# Patient Record
Sex: Female | Born: 1935 | Race: Black or African American | Hispanic: No | Smoking: Former smoker
Health system: Southern US, Community
[De-identification: ages and names within clinical notes are randomized; demographics above are authoritative.]

## PROBLEM LIST (undated history)

## (undated) DIAGNOSIS — M47816 Spondylosis without myelopathy or radiculopathy, lumbar region: Secondary | ICD-10-CM

## (undated) DIAGNOSIS — I639 Cerebral infarction, unspecified: Secondary | ICD-10-CM

## (undated) DIAGNOSIS — I4891 Unspecified atrial fibrillation: Secondary | ICD-10-CM

## (undated) DIAGNOSIS — H269 Unspecified cataract: Secondary | ICD-10-CM

## (undated) DIAGNOSIS — K219 Gastro-esophageal reflux disease without esophagitis: Secondary | ICD-10-CM

## (undated) DIAGNOSIS — R200 Anesthesia of skin: Secondary | ICD-10-CM

## (undated) DIAGNOSIS — H409 Unspecified glaucoma: Secondary | ICD-10-CM

## (undated) DIAGNOSIS — M1990 Unspecified osteoarthritis, unspecified site: Secondary | ICD-10-CM

## (undated) DIAGNOSIS — I10 Essential (primary) hypertension: Secondary | ICD-10-CM

## (undated) DIAGNOSIS — M545 Low back pain: Secondary | ICD-10-CM

## (undated) DIAGNOSIS — E119 Type 2 diabetes mellitus without complications: Secondary | ICD-10-CM

## (undated) DIAGNOSIS — Z8619 Personal history of other infectious and parasitic diseases: Secondary | ICD-10-CM

## (undated) DIAGNOSIS — B0229 Other postherpetic nervous system involvement: Secondary | ICD-10-CM

## (undated) DIAGNOSIS — G8929 Other chronic pain: Secondary | ICD-10-CM

## (undated) DIAGNOSIS — F411 Generalized anxiety disorder: Secondary | ICD-10-CM

## (undated) DIAGNOSIS — R011 Cardiac murmur, unspecified: Secondary | ICD-10-CM

## (undated) DIAGNOSIS — R51 Headache: Secondary | ICD-10-CM

## (undated) DIAGNOSIS — I499 Cardiac arrhythmia, unspecified: Secondary | ICD-10-CM

## (undated) DIAGNOSIS — K5792 Diverticulitis of intestine, part unspecified, without perforation or abscess without bleeding: Secondary | ICD-10-CM

## (undated) DIAGNOSIS — E785 Hyperlipidemia, unspecified: Secondary | ICD-10-CM

## (undated) DIAGNOSIS — R519 Headache, unspecified: Secondary | ICD-10-CM

## (undated) HISTORY — DX: Essential (primary) hypertension: I10

## (undated) HISTORY — DX: Hyperlipidemia, unspecified: E78.5

## (undated) HISTORY — DX: Unspecified glaucoma: H40.9

## (undated) HISTORY — DX: Generalized anxiety disorder: F41.1

## (undated) HISTORY — PX: COLONOSCOPY: SHX174

## (undated) HISTORY — DX: Other postherpetic nervous system involvement: B02.29

## (undated) HISTORY — DX: Low back pain: M54.5

## (undated) HISTORY — DX: Spondylosis without myelopathy or radiculopathy, lumbar region: M47.816

## (undated) HISTORY — DX: Unspecified osteoarthritis, unspecified site: M19.90

## (undated) HISTORY — DX: Unspecified cataract: H26.9

## (undated) HISTORY — DX: Unspecified atrial fibrillation: I48.91

## (undated) HISTORY — DX: Personal history of other infectious and parasitic diseases: Z86.19

## (undated) HISTORY — DX: Gastro-esophageal reflux disease without esophagitis: K21.9

## (undated) HISTORY — DX: Type 2 diabetes mellitus without complications: E11.9

## (undated) HISTORY — PX: POLYPECTOMY: SHX149

## (undated) HISTORY — DX: Other chronic pain: G89.29

---
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
## 1962-01-01 HISTORY — PX: ABDOMINAL HYSTERECTOMY: SHX81

## 1997-08-03 ENCOUNTER — Ambulatory Visit (HOSPITAL_BASED_OUTPATIENT_CLINIC_OR_DEPARTMENT_OTHER): Admission: RE | Admit: 1997-08-03 | Discharge: 1997-08-03 | Payer: Self-pay | Admitting: Orthopedic Surgery

## 2001-09-12 ENCOUNTER — Other Ambulatory Visit: Admission: RE | Admit: 2001-09-12 | Discharge: 2001-09-12 | Payer: Self-pay | Admitting: Family Medicine

## 2002-10-27 ENCOUNTER — Ambulatory Visit (HOSPITAL_BASED_OUTPATIENT_CLINIC_OR_DEPARTMENT_OTHER): Admission: RE | Admit: 2002-10-27 | Discharge: 2002-10-27 | Payer: Self-pay | Admitting: Orthopedic Surgery

## 2002-10-27 ENCOUNTER — Ambulatory Visit (HOSPITAL_COMMUNITY): Admission: RE | Admit: 2002-10-27 | Discharge: 2002-10-27 | Payer: Self-pay | Admitting: Orthopedic Surgery

## 2003-01-05 ENCOUNTER — Encounter (INDEPENDENT_AMBULATORY_CARE_PROVIDER_SITE_OTHER): Payer: Self-pay | Admitting: *Deleted

## 2003-01-05 ENCOUNTER — Ambulatory Visit (HOSPITAL_BASED_OUTPATIENT_CLINIC_OR_DEPARTMENT_OTHER): Admission: RE | Admit: 2003-01-05 | Discharge: 2003-01-05 | Payer: Self-pay | Admitting: Orthopedic Surgery

## 2003-01-05 ENCOUNTER — Ambulatory Visit (HOSPITAL_COMMUNITY): Admission: RE | Admit: 2003-01-05 | Discharge: 2003-01-05 | Payer: Self-pay | Admitting: Orthopedic Surgery

## 2005-02-06 ENCOUNTER — Other Ambulatory Visit: Admission: RE | Admit: 2005-02-06 | Discharge: 2005-02-06 | Payer: Self-pay | Admitting: Family Medicine

## 2005-08-13 ENCOUNTER — Encounter: Admission: RE | Admit: 2005-08-13 | Discharge: 2005-08-13 | Payer: Self-pay | Admitting: Orthopedic Surgery

## 2006-10-29 ENCOUNTER — Encounter: Admission: RE | Admit: 2006-10-29 | Discharge: 2006-10-29 | Payer: Self-pay | Admitting: Orthopedic Surgery

## 2008-11-21 ENCOUNTER — Emergency Department (HOSPITAL_COMMUNITY): Admission: EM | Admit: 2008-11-21 | Discharge: 2008-11-21 | Payer: Self-pay | Admitting: Emergency Medicine

## 2009-04-21 ENCOUNTER — Emergency Department (HOSPITAL_COMMUNITY)
Admission: EM | Admit: 2009-04-21 | Discharge: 2009-04-21 | Payer: Self-pay | Source: Home / Self Care | Admitting: Emergency Medicine

## 2009-06-22 ENCOUNTER — Encounter
Admission: RE | Admit: 2009-06-22 | Discharge: 2009-08-11 | Payer: Self-pay | Source: Home / Self Care | Admitting: Orthopedic Surgery

## 2010-05-19 NOTE — Op Note (Signed)
   NAME:  [name], [name]                             ACCOUNT NO.:  [id number]   MEDICAL RECORD NO.:  [id number]                   PATIENT TYPE:  AMB   LOCATION:  [place]                                  FACILITY:  [facility]   PHYSICIAN:  Youlanda Mighty. Luisa Dago., M.D.          DATE OF BIRTH:  Jan 26, 1933   DATE OF PROCEDURE:  10/27/2002  DATE OF DISCHARGE:                                 OPERATIVE REPORT   PREOPERATIVE DIAGNOSIS:  Entrapment neuropathy median nerve left carpal  tunnel.   POSTOPERATIVE DIAGNOSIS:  Entrapment neuropathy median nerve left carpal  tunnel.   OPERATION:  Release of left transverse carpal ligament.   SURGEON:  Youlanda Mighty. Sypher, M.D.   ASSISTANT:  Julian Reil, P.A.-C.   ANESTHESIA:  General by  LMA, supervising anesthesiologist Nelda Severe. Tobias Alexander,  M.D.   INDICATIONS FOR PROCEDURE:  Kimberly Castillo is a 77 year old woman who was  referred for evaluation  and management  of a numb and painful hand. The  clinical examination revealed signs of chronic carpal tunnel syndrome.  Electrodiagnostic studies confirmed median  neuropathy at the level  of the  wrist. Due to a failure to respond to nonoperative measures she is  brought  to the operating room at this time for release of the left transverse carpal  ligament.   DESCRIPTION OF PROCEDURE:  Kimberly Castillo is brought to the operating room and  placed in the supine position on the operating table. Following  induction  of general anesthesia the left arm was prepped with Betadine soap and  solution and sterilely draped. The pneumatic tourniquet was applied to the  proximal brachium. Following exsanguination of the limb with an Esmarch  bandage, the arterial tourniquet on the proximal brachium was inflated to  220 mmHg.   The procedure commenced with a short incision in the line of the ring finger  in the palm. The subcutaneous tissues were  carefully  divided from the  palmar fascia. This was split longitudinally through to  the common sensory  branches of the median nerve. These were followed  back to the transverse  carpal ligament which was carefully  isolated from the median nerve.   The ligament was released along its ulnar border, extending up into the  distal forearm. This widely opened the carpal canal. Bleeding points along  the marginally released ligament were electrocauterized. The wound was then  repaired with intradermal 3-0 Prolene.                                               Youlanda Mighty Luisa Dago., M.D.    RVS/MEDQ  D:  10/27/2002  T:  10/27/2002  Job:  [id number]

## 2010-05-19 NOTE — Op Note (Signed)
NAME:  Kimberly Castillo, Kimberly Castillo                             ACCOUNT NO.:  [id number]   MEDICAL RECORD NO.:  [id number]                   PATIENT TYPE:  AMB   LOCATION:  [place]                                  FACILITY:  [facility]   PHYSICIAN:  Youlanda Mighty. Luisa Dago., M.D.          DATE OF BIRTH:  02-Sep-1933   DATE OF PROCEDURE:  01/05/2003  DATE OF DISCHARGE:                                 OPERATIVE REPORT   PREOPERATIVE DIAGNOSES:  1. Chronic entrapment neuropathy of median nerve, right carpal tunnel.  2. 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  3. Wart-like lesion radial aspect, right thumb pulp.   POSTOPERATIVE DIAGNOSES:  1. Chronic entrapment neuropathy of median nerve, right carpal tunnel.  2. 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  3. Wart-like lesion radial aspect, right thumb pulp.   OPERATION PERFORMED:  1. Release of right ulnar nerve at cubital tunnel, [id number].  2. Right carpal tunnel release, 64721-RT-59.  3. Excisional biopsy of lesion, right thumb pulp, 26115-F5.   SURGEON:  Youlanda Mighty. Sypher, M.D.   ASSISTANT:  Julian Reil, P.A.   ANESTHESIA:  General by LMA.   SUPERVISING ANESTHESIOLOGIST:  Jessy Oto. Albertina Parr, M.D.   INDICATIONS FOR PROCEDURE:  Kimberly Castillo is a 77 year old woman referred by  Lucianne Lei, M.D. for evaluation and management of a painful and numb arm.  Clinical examination suggested carpal tunnel syndrome.  Dr. Criss Rosales had  referred Kimberly Castillo for electrodiagnostic studies at Dr. Mayra Reel office.  These were completed in September 2004 revealing evidence of bilateral  carpal tunnel syndrome and right worse than left ulnar neuropathy at the  elbow level.  Kimberly Castillo is status post release of her left transverse carpal  ligament and was very pleased with the results.  She requested identical  surgery on the right side and after informed consent, also requested surgery  to decompress her right ulnar nerve  at the cubital tunnel.  She has had a  chronic wart-like lesion on the radial aspect of her right thumb pulp and  requested that this be removed under the same anesthetic.  After informed  consent, she is brought to the operating room at this time.   DESCRIPTION OF PROCEDURE:  The patient was brought to the operating room and  placed in supine position upon the operating table.  Following induction of  anesthesia by LMA, the right arm was prepped with Betadine soap and solution  and sterilely draped.  Following exsanguination of the limb with an Esmarch  bandage, an arterial tourniquet on the proximal brachium was inflated to 220  mmHg.  The procedure commenced with a short incision over the path of the  ulnar nerve at the cubital tunnel.  Subcutaneous tissue were carefully  divided taking care to identify the medial epicondyle.  The arcuate ligament  was identified and after careful isolation of the ulnar  nerve, this was  released on its posterior aspect with scissors.  Osborne's band was  identified deep to the head of the flexor carpi ulnaris and released.  Several fibrous bands deep to the head of the flexor carpi ulnaris were  isolated and released.  Hemostasis was achieved with bipolar cautery.   Attention was then directed to the proximal brachium.  Subcutaneous tissues  were gently spread with scissors followed by release of the brachial fascia  6 cm above the epicondyle.  The nerve was noted to be stable through a range  of motion of 0 to 140 degrees elbow flexion.  The triceps was left  undisturbed.  The wound was repaired with intradermal 3-0 Prolene suture.  Attention was then directed to the palm.  A short incision was fashioned in  the line of the ring finger in the palm.  The subcutaneous tissues were  carefully divided revealing the palmar fascia.  This was split  longitudinally to reveal the common sensory branch of the median nerve.  These were followed back to the  transverse carpal ligament which was  carefully separated from the median nerve.  The ligament was released on its  ulnar border extending to the distal forearm.  This widely opened the carpal  canal.  No masses or other predicaments were noted.  Bleeding points along  the margin of the released ligament were electrocauterized with bipolar  current followed by repair of the skin with intradermal 3-0 Prolene suture.  A compressive dressing was applied.   Attention was then directed to the thumb.  An elliptical incision was  fashioned around the wart-like lesion, excising a full thickness skin biopsy  down to the subcutaneous fat.  Care was taken to prevent injury to the  proper digital nerve branches.  This skin lesion was passed off in Formalin  for pathologic evaluation.  The wound was then repaired with interrupted  suture of 5-0 nylon.  The arm was dressed with OpSite dressing over gauze at  the elbow followed by Ace wrap.  The wrist was immobilized with a voluminous  gauze and Webril dressing with a volar plaster splint maintaining the wrist  in 5 degrees of dorsiflexion.  The thumb was dressed with Xeroflo, sterile  gauze and Coban.  There were no apparent complications.  Kimberly Castillo tolerated  the surgery and anesthesia well.  She was transferred to the recovery room  with stable vital signs.   For aftercare she was given a prescription for Darvocet-N 100 one or two  tablets p.o. q.4-6h. p.r.n. pain.  She will return to our office for follow-  up in 7 to 10 days to begin an exercise program.                                               Youlanda Mighty. Luisa Dago., M.D.    RVS/MEDQ  D:  01/05/2003  T:  01/05/2003  Job:  [id number]

## 2011-03-02 ENCOUNTER — Emergency Department (HOSPITAL_COMMUNITY)
Admission: EM | Admit: 2011-03-02 | Discharge: 2011-03-02 | Disposition: A | Discharge status: Home / Self Care | Payer: Medicare Other | Attending: Emergency Medicine | Admitting: Emergency Medicine

## 2011-03-02 DIAGNOSIS — G43909 Migraine, unspecified, not intractable, without status migrainosus: Secondary | ICD-10-CM | POA: Insufficient documentation

## 2011-03-02 MED ORDER — DIPHENHYDRAMINE HCL 50 MG/ML IJ SOLN
25.0000 mg | Freq: Once | INTRAMUSCULAR | Status: AC
  Administered 2011-03-02: 25 mg via INTRAVENOUS
  Filled 2011-03-02: qty 1

## 2011-03-02 MED ORDER — METOCLOPRAMIDE HCL 5 MG/ML IJ SOLN
10.0000 mg | Freq: Once | INTRAMUSCULAR | Status: AC
  Administered 2011-03-02: 10 mg via INTRAVENOUS
  Filled 2011-03-02: qty 2

## 2011-03-02 MED ORDER — SODIUM CHLORIDE 0.9 % IV BOLUS (SEPSIS)
1000.0000 mL | Freq: Once | INTRAVENOUS | Status: AC
  Administered 2011-03-02: 1000 mL via INTRAVENOUS

## 2011-03-02 NOTE — ED Notes (Signed)
Pt. Started with pain in her right temple extending to her right eye area today.  Does not see an MD for migraines but says she has a history.

## 2011-03-02 NOTE — Discharge Instructions (Signed)
.  Migraine Headache A migraine is very bad pain on one or both sides of your head. The cause of a migraine is not always known. A migraine can be triggered or caused by different things, such as:  Alcohol.   Smoking.   Stress.   Periods (menstruation) in women.   Aged cheeses.   Foods or drinks that contain nitrates, glutamate, aspartame, or tyramine.   Lack of sleep.   Chocolate.   Caffeine.   Hunger.   Medicines, such as nitroglycerine (used to treat chest pain), birth control pills, estrogen, and some blood pressure medicines.  HOME CARE  Many medicines can help migraine pain or keep migraines from coming back. Your doctor can help you decide on a medicine or treatment program.   If you or your child gets a migraine, it may help to lie down in a dark, quiet room.   Keep a headache journal. This may help find out what is causing the headaches. For example, write down:   What you eat and drink.   How much sleep you get.   Any change to your diet or medicines.  GET HELP RIGHT AWAY IF:   The medicine does not work.   The pain begins again.   The neck is stiff.   You have trouble seeing.   The muscles are weak or you lose muscle control.   You have new symptoms.   You lose your balance.   You have trouble walking.   You feel faint or pass out.  MAKE SURE YOU:   Understand these instructions.   Will watch this condition.   Will get help right away if you are not doing well or get worse.  Document Released: 09/27/2007 Document Revised: 08/30/2010 Document Reviewed: 08/23/2008 [facility] Patient Information 2012 [place].

## 2011-03-02 NOTE — ED Provider Notes (Signed)
Medical screening examination/treatment/procedure(s) were performed by non-physician practitioner and as supervising physician I was immediately available for consultation/collaboration.   Trisha Mangle, MD 03/02/11 2151

## 2011-03-02 NOTE — ED Provider Notes (Signed)
History     CSN: [id number]  Arrival date & time 03/02/11  1718   First MD Initiated Contact with Patient 03/02/11 1730      Chief Complaint  Patient presents with  . Headache    (Consider location/radiation/quality/duration/timing/severity/associated sxs/prior treatment) HPI Comments: Patient with a history of diabetes presents emergency Department with chief complaint of migraine.  Patient states that this episode started at 5 o'clock this morning and presented like her typical migraines.  Patient states it is located primarily in her right temple and eye area, it is throbbing in nature and 10 out of 10 in severity.  Patient denies a recent head injury, change in vision, double vision, slurred speech, extremity weakness, eye pain, ear pain, facial droop, chest pain, shortness of breath, vomiting, dyspnea on exertion.  Patient did not have medications at home to treat her migraine.  Patient reports photophobia and increased pain with loud noises and movement.  The pain does not radiate anywhere and is associated with mild nausea.  Patient denies disequilibrium or ataxia.  Patient is a 78 y.o. female presenting with headaches. The history is provided by the patient.  Headache  Associated symptoms include nausea. Pertinent negatives include no fever, no shortness of breath and no vomiting.    No past medical history on file.  No past surgical history on file.  No family history on file.  History  Substance Use Topics  . Smoking status: Not on file  . Smokeless tobacco: Not on file  . Alcohol Use: Not on file    OB History    No data available      Review of Systems  Constitutional: Positive for activity change. Negative for fever, chills, diaphoresis and fatigue.  HENT: Negative for ear pain, congestion, facial swelling, neck pain, neck stiffness, sinus pressure and tinnitus.   Eyes: Positive for photophobia. Negative for redness and visual disturbance.  Respiratory:  Negative for cough, shortness of breath, wheezing and stridor.   Cardiovascular: Negative for chest pain.  Gastrointestinal: Positive for nausea. Negative for vomiting and abdominal pain.  Musculoskeletal: Negative for myalgias and gait problem.  Skin: Negative for rash.  Neurological: Positive for headaches. Negative for dizziness, syncope, speech difficulty, weakness, light-headedness and numbness.       No bowel or bladder incontinence.  Psychiatric/Behavioral: Negative for confusion.  All other systems reviewed and are negative.    Allergies  Review of patient's allergies indicates no known allergies.  Home Medications   Current Outpatient Rx  Name Route Sig Dispense Refill  . AMLODIPINE BESY-BENAZEPRIL HCL 10-20 MG PO CAPS Oral Take 1 capsule by mouth daily.    [name] ESZOPICLONE 2 MG PO TABS Oral Take 2 mg by mouth at bedtime. Take immediately before bedtime    . METFORMIN HCL 500 MG PO TABS Oral Take 500 mg by mouth 2 (two) times daily with a meal.    . TRAMADOL HCL 50 MG PO TABS Oral Take 50 mg by mouth every 6 (six) hours as needed.    [name] VALACYCLOVIR HCL 500 MG PO TABS Oral Take 500 mg by mouth 2 (two) times daily.      BP 154/83  Pulse 93  Temp(Src) 98.9 F (37.2 C) (Oral)  Ht 5\' 2"  (1.575 m)  Wt 140 lb (63.504 kg)  BMI 25.61 kg/m2  SpO2 97%  Physical Exam  Nursing note and vitals reviewed. Constitutional: She is oriented to person, place, and time. She appears well-developed and well-nourished. No distress.  HENT:  Head: Normocephalic and atraumatic.  Right Ear: External ear normal.  Left Ear: External ear normal.       Temporal pulses palpable, no rash or tenderness to palpation of temporal regions.  Eyes: Conjunctivae and EOM are normal. Pupils are equal, round, and reactive to light. Right eye exhibits no discharge. Left eye exhibits no discharge. Right conjunctiva is not injected. Right conjunctiva has no hemorrhage. Left conjunctiva is not injected. Left  conjunctiva has no hemorrhage. No scleral icterus. Right eye exhibits no nystagmus. Left eye exhibits no nystagmus.       No pain with EOM's.  Neck: Normal range of motion and full passive range of motion without pain. Neck supple. No JVD present. No spinous process tenderness present. Carotid bruit is not present. No rigidity. No Brudzinski's sign noted.  Cardiovascular: Normal rate, regular rhythm, normal heart sounds and intact distal pulses.   Pulmonary/Chest: Effort normal and breath sounds normal. No respiratory distress. She has no wheezes. She has no rales.  Musculoskeletal: Normal range of motion.  Lymphadenopathy:    She has no cervical adenopathy.  Neurological: She is alert and oriented to person, place, and time. She has normal strength. No cranial nerve deficit or sensory deficit. She displays a negative Romberg sign. Coordination and gait normal. GCS eye subscore is 4. GCS verbal subscore is 5. GCS motor subscore is 6.       A&O x3.  Able to follow commands. PERRL, EOMs, no vertical or bidirectional nystagmus. Shoulder shrug, facial muscles, tongue protrusion and swallow intact.  Motor strength 5/5 bilaterally including grip strength, triceps, hamstrings and ankle dorsiflexion.  Normal patellar DTRs.  Light touch intact in all 4 distal limbs.  Intact finger to nose, shin to heel and rapid alternating movements. No ataxia or dysequilibrium.   Skin: Skin is warm and dry. No rash noted. She is not diaphoretic.  Psychiatric: She has a normal mood and affect. Her behavior is normal.    ED Course  Procedures (including critical care time)  Labs Reviewed - No data to display No results found.   No diagnosis found.    MDM  Migraine  Patient without focal neurological deficits on physical exam.  Presentation non-concerning for temporal arteritis or stroke.  Patient states this is her typical presentation of regular migraines.  There is no rash, pain or burning on palpation of the  temporal region.  Patient has received treatment in emergency Department of fluids, Reglan, and Benadryl.  Patient states her headache has improved however it is not 100% cleared.  Patient will be discharged with advice to followup with her primary care doctor in regards to prophylactic migraine treatment.  Patient is agreeable with plan.  This patient case was discussed with Dr. Edison Pace who agrees with my plan to discharge patient and that she does not require further workup.        Verl Dicker, [place] 03/02/11 [phone]

## 2011-03-02 NOTE — ED Notes (Signed)
Pt has hx of migraines. Migraine started at 05:00 this morning. Denies having any medication for migraines at home.

## 2011-08-13 ENCOUNTER — Other Ambulatory Visit: Payer: Self-pay | Admitting: Family Medicine

## 2011-08-13 ENCOUNTER — Encounter: Payer: Self-pay | Admitting: Internal Medicine

## 2011-08-13 DIAGNOSIS — Z1231 Encounter for screening mammogram for malignant neoplasm of breast: Secondary | ICD-10-CM

## 2011-08-13 DIAGNOSIS — Z780 Asymptomatic menopausal state: Secondary | ICD-10-CM

## 2011-08-30 ENCOUNTER — Ambulatory Visit
Admission: RE | Admit: 2011-08-30 | Discharge: 2011-08-30 | Disposition: A | Discharge status: Home / Self Care | Payer: Medicare Other | Source: Ambulatory Visit | Attending: Family Medicine | Admitting: Family Medicine

## 2011-08-30 DIAGNOSIS — Z780 Asymptomatic menopausal state: Secondary | ICD-10-CM

## 2011-08-30 DIAGNOSIS — Z1231 Encounter for screening mammogram for malignant neoplasm of breast: Secondary | ICD-10-CM

## 2011-09-17 ENCOUNTER — Ambulatory Visit (AMBULATORY_SURGERY_CENTER): Payer: Medicare Other | Admitting: *Deleted

## 2011-09-17 ENCOUNTER — Encounter: Payer: Self-pay | Admitting: Internal Medicine

## 2011-09-17 VITALS — Ht 62.5 in | Wt 143.6 lb

## 2011-09-17 DIAGNOSIS — Z1211 Encounter for screening for malignant neoplasm of colon: Secondary | ICD-10-CM

## 2011-09-17 MED ORDER — MOVIPREP 100 G PO SOLR: ORAL | Status: DC

## 2011-10-01 ENCOUNTER — Ambulatory Visit (AMBULATORY_SURGERY_CENTER): Payer: Medicare Other | Admitting: Internal Medicine

## 2011-10-01 ENCOUNTER — Encounter: Payer: Self-pay | Admitting: Internal Medicine

## 2011-10-01 VITALS — BP 108/76 | HR 76 | Temp 96.9°F | Resp 19 | Ht 62.5 in | Wt 143.0 lb

## 2011-10-01 DIAGNOSIS — Z1211 Encounter for screening for malignant neoplasm of colon: Secondary | ICD-10-CM

## 2011-10-01 DIAGNOSIS — D126 Benign neoplasm of colon, unspecified: Secondary | ICD-10-CM

## 2011-10-01 LAB — GLUCOSE, CAPILLARY: Glucose-Capillary: 111 mg/dL — ABNORMAL HIGH (ref 70–99)

## 2011-10-01 MED ORDER — SODIUM CHLORIDE 0.9 % IV SOLN: 500.0000 mL | INTRAVENOUS | Status: DC

## 2011-10-01 NOTE — Progress Notes (Addendum)
Propofol per B Silvio Clayman CRNA, all meds titrated per CRNA during procedure. See scanned intra procedure report. ewm  Attempted to scope with an adult scope and unsuccessful due to a stricture. Changed to pediatric scope per Dr Henrene Pastor. emw

## 2011-10-01 NOTE — Progress Notes (Signed)
Patient did not experience any of the following events: a burn prior to discharge; a fall within the facility; wrong site/side/patient/procedure/implant event; or a hospital transfer or hospital admission upon discharge from the facility. (G8907) Patient did not have preoperative order for IV antibiotic SSI prophylaxis. (G8918)  

## 2011-10-01 NOTE — Patient Instructions (Addendum)
YOU HAD AN ENDOSCOPIC PROCEDURE TODAY AT THE [facility]: Refer to the procedure report that was given to you for any specific questions about what was found during the examination.  If the procedure report does not answer your questions, please call your gastroenterologist to clarify.  If you requested that your care partner not be given the details of your procedure findings, then the procedure report has been included in a sealed envelope for you to review at your convenience later.  YOU SHOULD EXPECT: Some feelings of bloating in the abdomen. Passage of more gas than usual.  Walking can help get rid of the air that was put into your GI tract during the procedure and reduce the bloating. If you had a lower endoscopy (such as a colonoscopy or flexible sigmoidoscopy) you may notice spotting of blood in your stool or on the toilet paper. If you underwent a bowel prep for your procedure, then you may not have a normal bowel movement for a few days.  DIET: Your first meal following the procedure should be a light meal and then it is ok to progress to your normal diet.  A half-sandwich or bowl of soup is an example of a good first meal.  Heavy or fried foods are harder to digest and may make you feel nauseous or bloated.  Likewise meals heavy in dairy and vegetables can cause extra gas to form and this can also increase the bloating.  Drink plenty of fluids but you should avoid alcoholic beverages for 24 hours.  ACTIVITY: Your care partner should take you home directly after the procedure.  You should plan to take it easy, moving slowly for the rest of the day.  You can resume normal activity the day after the procedure however you should NOT DRIVE or use heavy machinery for 24 hours (because of the sedation medicines used during the test).    SYMPTOMS TO REPORT IMMEDIATELY: A gastroenterologist can be reached at any hour.  During normal business hours, 8:30 AM to 5:00 PM Monday through Friday,  call [phone].  After hours and on weekends, please call the GI answering service at [phone] who will take a message and have the physician on call contact you.   Following lower endoscopy (colonoscopy or flexible sigmoidoscopy):  Excessive amounts of blood in the stool  Significant tenderness or worsening of abdominal pains  Swelling of the abdomen that is new, acute  Fever of 100F or higher  Following upper endoscopy (EGD)  Vomiting of blood or coffee ground material  New chest pain or pain under the shoulder blades  Painful or persistently difficult swallowing  New shortness of breath  Fever of 100F or higher  Black, tarry-looking stools  FOLLOW UP: If any biopsies were taken you will be contacted by phone or by letter within the next 1-3 weeks.  Call your gastroenterologist if you have not heard about the biopsies in 3 weeks.  Our staff will call the home number listed on your records the next business day following your procedure to check on you and address any questions or concerns that you may have at that time regarding the information given to you following your procedure. This is a courtesy call and so if there is no answer at the home number and we have not heard from you through the emergency physician on call, we will assume that you have returned to your regular daily activities without incident.  SIGNATURES/CONFIDENTIALITY: You and/or your care   partner have signed paperwork which will be entered into your electronic medical record.  These signatures attest to the fact that that the information above on your After Visit Summary has been reviewed and is understood.  Full responsibility of the confidentiality of this discharge information lies with you and/or your care-partner.  

## 2011-10-01 NOTE — Op Note (Addendum)
[facility]  [address] [place], [address]   COLONOSCOPY PROCEDURE REPORT  PATIENT: Kimberly Castillo, Kimberly Castillo  MR#: [id number] BIRTHDATE: [birth year] , 78  yrs. old GENDER: Female ENDOSCOPIST: Eustace Quail, MD REFERRED [id number] Martin, M.D. PROCEDURE DATE:  10/01/2011 PROCEDURE:   Colonoscopy with snare polypectomy    x 1 ASA CLASS:   Class II INDICATIONS:average risk screening. MEDICATIONS: MAC sedation, administered by CRNA and propofol (Diprivan) 200mg  IV  DESCRIPTION OF PROCEDURE:   After the risks benefits and alternatives of the procedure were thoroughly explained, informed consent was obtained.  A digital rectal exam revealed no abnormalities of the rectum.   The LB CF-H180AL [id number] and LB PCF-H180AL [id number]  endoscope was introduced through the anus and advanced to the cecum, which was identified by both the appendix and ileocecal valve. No adverse events experienced.   The quality of the prep was excellent, using MoviPrep  The instrument was then slowly withdrawn as the colon was fully examined.      COLON FINDINGS: A diminutive polyp was found in the ascending colon. A polypectomy was performed with a cold snare.  The resection was complete and the polyp tissue was completely retrieved.   Moderate diverticulosis was noted in the descending colon and sigmoid colon with sigmoid stenosis (had to change to PEDS scope).   The colon mucosa was otherwise normal.  Retroflexed views revealed no abnormalities. The time to cecum=1 minutes 55 seconds.  Withdrawal time=8 minutes 35 seconds.  The scope was withdrawn and the procedure completed. COMPLICATIONS: There were no complications.  ENDOSCOPIC IMPRESSION: 1.   Diminutive polyp was found in the ascending colon; polypectomy was performed with a cold snare 2.   Moderate diverticulosis was noted in the descending colon and sigmoid colon with stenosis 3.   The colon mucosa was otherwise  normal  RECOMMENDATIONS: 1. Repeat colonoscopy in 5 years if polyp adenomatous; otherwise 10 years   eSigned:  Eustace Quail, MD 10/01/2011 9:00 AM   cc: Larina Earthly, MD and The Patient   PATIENT NAME:  Kimberly Castillo, Kimberly Castillo MR#: [id number]

## 2011-10-02 ENCOUNTER — Telehealth: Payer: Self-pay | Admitting: *Deleted

## 2011-10-02 NOTE — Telephone Encounter (Signed)
  Follow up Call-  Call back number 10/01/2011  Post procedure Call Back phone  # [phone]  Permission to leave phone message Yes     Patient questions:  Do you have a fever, pain , or abdominal swelling? no Pain Score  0 *  Have you tolerated food without any problems? yes  Have you been able to return to your normal activities? yes  Do you have any questions about your discharge instructions: Diet   no Medications  no Follow up visit  no  Do you have questions or concerns about your Care? no  Actions: * If pain score is 4 or above: No action needed, pain <4.

## 2011-10-05 ENCOUNTER — Encounter: Payer: Self-pay | Admitting: Internal Medicine

## 2012-07-31 ENCOUNTER — Other Ambulatory Visit: Payer: Self-pay

## 2012-07-31 DIAGNOSIS — Z1231 Encounter for screening mammogram for malignant neoplasm of breast: Secondary | ICD-10-CM

## 2012-07-31 LAB — HM MAMMOGRAPHY

## 2012-09-02 ENCOUNTER — Ambulatory Visit
Admission: RE | Admit: 2012-09-02 | Discharge: 2012-09-02 | Disposition: A | Discharge status: Home / Self Care | Payer: Medicare Other | Source: Ambulatory Visit

## 2012-09-02 DIAGNOSIS — Z1231 Encounter for screening mammogram for malignant neoplasm of breast: Secondary | ICD-10-CM

## 2012-09-19 ENCOUNTER — Ambulatory Visit (INDEPENDENT_AMBULATORY_CARE_PROVIDER_SITE_OTHER): Payer: Medicare Other | Admitting: Internal Medicine

## 2012-09-19 ENCOUNTER — Other Ambulatory Visit (INDEPENDENT_AMBULATORY_CARE_PROVIDER_SITE_OTHER): Payer: Medicare Other

## 2012-09-19 ENCOUNTER — Encounter: Payer: Self-pay | Admitting: Internal Medicine

## 2012-09-19 VITALS — BP 140/70 | HR 98 | Temp 98.1°F | Wt 162.0 lb

## 2012-09-19 DIAGNOSIS — E119 Type 2 diabetes mellitus without complications: Secondary | ICD-10-CM

## 2012-09-19 DIAGNOSIS — Z23 Encounter for immunization: Secondary | ICD-10-CM

## 2012-09-19 DIAGNOSIS — E785 Hyperlipidemia, unspecified: Secondary | ICD-10-CM

## 2012-09-19 DIAGNOSIS — Z130 Encounter for screening for diseases of the blood and blood-forming organs and certain disorders involving the immune mechanism: Secondary | ICD-10-CM

## 2012-09-19 DIAGNOSIS — I10 Essential (primary) hypertension: Secondary | ICD-10-CM

## 2012-09-19 DIAGNOSIS — Z1321 Encounter for screening for nutritional disorder: Secondary | ICD-10-CM

## 2012-09-19 LAB — COMPREHENSIVE METABOLIC PANEL
Albumin: 3.5 g/dL (ref 3.5–5.2)
Alkaline Phosphatase: 62 U/L (ref 39–117)
BUN: 19 mg/dL (ref 6–23)
Calcium: 9.1 mg/dL (ref 8.4–10.5)
Creatinine, Ser: 1 mg/dL (ref 0.4–1.2)
Glucose, Bld: 95 mg/dL (ref 70–99)
Potassium: 3.3 mEq/L — ABNORMAL LOW (ref 3.5–5.1)

## 2012-09-19 LAB — LIPID PANEL
LDL Cholesterol: 85 mg/dL (ref 0–99)
VLDL: 25.8 mg/dL (ref 0.0–40.0)

## 2012-09-19 LAB — CBC
Hemoglobin: 11.1 g/dL — ABNORMAL LOW (ref 12.0–15.0)
MCHC: 33.6 g/dL (ref 30.0–36.0)
MCV: 97.8 fl (ref 78.0–100.0)
Platelets: 229 10*3/uL (ref 150.0–400.0)
RBC: 3.37 Mil/uL — ABNORMAL LOW (ref 3.87–5.11)

## 2012-09-19 LAB — HEMOGLOBIN A1C: Hgb A1c MFr Bld: 6.6 % — ABNORMAL HIGH (ref 4.6–6.5)

## 2012-09-19 LAB — MICROALBUMIN / CREATININE URINE RATIO
Microalb Creat Ratio: 13.4 mg/g (ref 0.0–30.0)
Microalb, Ur: 9.1 mg/dL — ABNORMAL HIGH (ref 0.0–1.9)

## 2012-09-19 NOTE — Progress Notes (Signed)
HPI  Pt presents to the clinic today to establish care. She does not have a PCP. Pt does have some concern today about shingles pain. She was diagnosed with shingles 2 months ago and received the shingles vaccines at that time. Since then , she c/o intermittent back pain on the left side. She describes it as sharp and burning. She has not been given anything for the pain.  Flu: never Tetanus: more than 10 years ago Pneumovax: never Zostovax: 2014 Pap smear: 2013 [place]: 2013 Colonoscopy: 2013 Bone Scan: unsure of date Eye Doctor: yearly Dentist: yearly  Past Medical History  Diagnosis Date  . Glaucoma   . Diabetes mellitus   . Hypertension   . Hyperlipidemia   . Arthritis     hands, back  . History of shingles   . Cataracts, bilateral     Current Outpatient Prescriptions  Medication Sig Dispense Refill  . amLODipine (NORVASC) 10 MG tablet Take 10 mg by mouth daily.      [name] azelastine (OPTIVAR) 0.05 % ophthalmic solution Place 1 drop into both eyes 2 (two) times daily.      . Diclofenac Sodium (PENNSAID) 1.5 % SOLN Place onto the skin 4 (four) times daily as needed.      . diltiazem (CARDIZEM) 120 MG tablet Take 120 mg by mouth daily.      [name] HYDROcodone-acetaminophen (LORCET) 10-650 MG per tablet Take 1 tablet by mouth every 6 (six) hours as needed. pain      . hydrOXYzine (ATARAX/VISTARIL) 25 MG tablet Take 25 mg by mouth 3 (three) times daily as needed.      . latanoprost (XALATAN) 0.005 % ophthalmic solution Place 1 drop into both eyes at bedtime.      [name] levobunolol (BETAGAN) 0.5 % ophthalmic solution Place 1 drop into both eyes 2 (two) times daily.      [name] lisinopril-hydrochlorothiazide (PRINZIDE,ZESTORETIC) 10-12.5 MG per tablet Take 1 tablet by mouth 2 (two) times daily.      . metFORMIN (GLUCOPHAGE) 500 MG tablet Take 500 mg by mouth daily.       . potassium chloride (KLOR-CON) 20 MEQ packet Take 20 mEq by mouth daily.      . predniSONE (DELTASONE) 5 MG tablet Take 5 mg  by mouth daily.      . simvastatin (ZOCOR) 40 MG tablet Take 40 mg by mouth at bedtime.      . traMADol (ULTRAM) 50 MG tablet Take 50 mg by mouth every 6 (six) hours as needed.      . valACYclovir (VALTREX) 500 MG tablet Take 500 mg by mouth 2 (two) times daily.      [name] zolpidem (AMBIEN) 5 MG tablet Take 5 mg by mouth at bedtime as needed. sleep       No current facility-administered medications for this visit.    No Known Allergies  Family History  Problem Relation Age of Onset  . Colon cancer Neg Hx   . Stomach cancer Neg Hx     History   Social History  . Marital Status: Single    Spouse Name: N/A    Number of Children: N/A  . Years of Education: N/A   Occupational History  . Not on file.   Social History Main Topics  . Smoking status: Former Smoker    Start date: 09/17/1991  . Smokeless tobacco: Never Used  . Alcohol Use: No  . Drug Use: No  . Sexual Activity: Not on file   Other  Topics Concern  . Not on file   Social History Narrative  . No narrative on file    ROS:  Constitutional: Denies fever, malaise, fatigue, headache or abrupt weight changes.  HEENT: Denies eye pain, eye redness, ear pain, ringing in the ears, wax buildup, runny nose, nasal congestion, bloody nose, or sore throat. Respiratory: Denies difficulty breathing, shortness of breath, cough or sputum production.   Cardiovascular: Denies chest pain, chest tightness, palpitations or swelling in the hands or feet.  Gastrointestinal: Denies abdominal pain, bloating, constipation, diarrhea or blood in the stool.  GU: Denies frequency, urgency, pain with urination, blood in urine, odor or discharge. Musculoskeletal: Denies decrease in range of motion, difficulty with gait, muscle pain or joint pain and swelling.  Skin: Denies redness, rashes, lesions or ulcercations.  Neurological: Denies dizziness, difficulty with memory, difficulty with speech or problems with balance and coordination.   No other  specific complaints in a complete review of systems (except as listed in HPI above).  PE:  BP 140/70  Pulse 98  Temp(Src) 98.1 F (36.7 C) (Oral)  Wt 162 lb (73.483 kg)  BMI 29.14 kg/m2  SpO2 96% Wt Readings from Last 3 Encounters:  09/19/12 162 lb (73.483 kg)  10/01/11 143 lb (64.864 kg)  09/17/11 143 lb 9.6 oz (65.137 kg)    General: Appears her stated age, well developed, well nourished in NAD. HEENT: Head: normal shape and size; Eyes: sclera white, no icterus, conjunctiva pink, PERRLA and EOMs intact; Ears: Tm's gray and intact, normal light reflex; Nose: mucosa pink and moist, septum midline; Throat/Mouth: Teeth present, mucosa pink and moist, no lesions or ulcerations noted.  Neck: Normal range of motion. Neck supple, trachea midline. No massses, lumps or thyromegaly present.  Cardiovascular: Normal rate and rhythm. S1,S2 noted.  No murmur, rubs or gallops noted. No JVD or BLE edema. No carotid bruits noted. Pulmonary/Chest: Normal effort and positive vesicular breath sounds. No respiratory distress. No wheezes, rales or ronchi noted.  Abdomen: Soft and nontender. Normal bowel sounds, no bruits noted. No distention or masses noted. Liver, spleen and kidneys non palpable. Musculoskeletal: Normal range of motion. No signs of joint swelling. No difficulty with gait.  Neurological: Alert and oriented. Cranial nerves II-XII intact. Coordination normal. +DTRs bilaterally. Psychiatric: Mood and affect normal. Behavior is normal. Judgment and thought content normal.      Assessment and Plan:  Preventative health maintenance:  Pt declines flu vaccine today Tdap given today Screening labs today  Shingles neuropathy:  Will call in Neurontin 100 mg TID prn

## 2012-09-19 NOTE — Progress Notes (Signed)
HPI: Pt presents to office today to establish care. Pt does report nerve pain related to shingles. Pt recently diagnosed with shingles within last few months and received Zostavax vaccine. Pt reports the pain as intermittent and on mid back, feels like burning and sharp pain at site. Pt denies any other concerns at this time.  Colonoscopy: 09/2011 Eye exam: 09/2012 Foot exam: never, done today Mammogram: 08/2011 Tetanus: >80yrs; received today Flu: declined Zostavax vaccine: July 2014 Pap smear: 08/2011  Past Medical History  Diagnosis Date  . Glaucoma   . Diabetes mellitus   . Hypertension   . Hyperlipidemia   . Arthritis     hands, back  . History of shingles   . Cataracts, bilateral   . A-fib     Current Outpatient Prescriptions  Medication Sig Dispense Refill  . amLODipine (NORVASC) 10 MG tablet Take 10 mg by mouth daily.      [name] azelastine (OPTIVAR) 0.05 % ophthalmic solution Place 1 drop into both eyes 2 (two) times daily.      . Diclofenac Sodium (PENNSAID) 1.5 % SOLN Place onto the skin 4 (four) times daily as needed.      . diltiazem (CARDIZEM) 120 MG tablet Take 120 mg by mouth daily.      [name] HYDROcodone-acetaminophen (LORCET) 10-650 MG per tablet Take 1 tablet by mouth every 8 (eight) hours as needed. pain      . hydrOXYzine (ATARAX/VISTARIL) 25 MG tablet Take 25 mg by mouth 3 (three) times daily as needed.      . latanoprost (XALATAN) 0.005 % ophthalmic solution Place 1 drop into both eyes at bedtime.      [name] levobunolol (BETAGAN) 0.5 % ophthalmic solution Place 1 drop into both eyes 2 (two) times daily.      [name] lisinopril-hydrochlorothiazide (PRINZIDE,ZESTORETIC) 10-12.5 MG per tablet Take 1 tablet by mouth 2 (two) times daily.      . metFORMIN (GLUCOPHAGE) 500 MG tablet Take 500 mg by mouth daily.       . potassium chloride (KLOR-CON) 20 MEQ packet Take 20 mEq by mouth daily.      . predniSONE (DELTASONE) 5 MG tablet Take 5 mg by mouth daily.      . simvastatin (ZOCOR)  40 MG tablet Take 40 mg by mouth at bedtime.      . traMADol (ULTRAM) 50 MG tablet Take 50 mg by mouth every 6 (six) hours as needed.      . valACYclovir (VALTREX) 500 MG tablet Take 500 mg by mouth 2 (two) times daily.      [name] zolpidem (AMBIEN) 5 MG tablet Take 5 mg by mouth at bedtime as needed. sleep       No current facility-administered medications for this visit.    No Known Allergies  Family History  Problem Relation Age of Onset  . Colon cancer Neg Hx   . Stomach cancer Neg Hx   . Hypertension Mother   . Hypertension Maternal Grandmother     History   Social History  . Marital Status: Single    Spouse Name: N/A    Number of Children: N/A  . Years of Education: N/A   Occupational History  . Not on file.   Social History Main Topics  . Smoking status: Former Smoker    Types: Cigarettes    Start date: 09/17/1991  . Smokeless tobacco: Never Used  . Alcohol Use: No  . Drug Use: No  . Sexual Activity: Yes   Other  Topics Concern  . Not on file   Social History Narrative  . No narrative on file    ROS:  Constitutional: Denies fever, malaise, fatigue, headache or abrupt weight changes.  HEENT: Denies eye pain, eye redness, ear pain, ringing in the ears, wax buildup, runny nose, nasal congestion, bloody nose, or sore throat. Respiratory: Denies difficulty breathing, shortness of breath, cough or sputum production.   Cardiovascular: Denies chest pain, chest tightness, palpitations or swelling in the hands or feet.  Gastrointestinal: Denies abdominal pain, bloating, constipation, diarrhea or blood in the stool.  GU: Denies frequency, urgency, pain with urination, blood in urine, odor or discharge. Musculoskeletal: Denies decrease in range of motion, difficulty with gait, muscle pain or joint pain and swelling.  Skin: Endorses closed shingles under left breast and back. Denies redness, rashes,or ulcercations.  Neurological: Denies dizziness, difficulty with memory,  difficulty with speech or problems with balance and coordination.   No other specific complaints in a complete review of systems (except as listed in HPI above).  PE:  BP 140/70  Pulse 98  Temp(Src) 98.1 F (36.7 C) (Oral)  Wt 162 lb (73.483 kg)  BMI 29.14 kg/m2  SpO2 96% Wt Readings from Last 3 Encounters:  09/19/12 162 lb (73.483 kg)  10/01/11 143 lb (64.864 kg)  09/17/11 143 lb 9.6 oz (65.137 kg)    General: Appears their stated age, overweight, well developed, well nourished in NAD. HEENT: Head: normal shape and size; Eyes: sclera white, no icterus, conjunctiva pink, PERRLA and EOMs intact; Ears: Tm's gray and intact, normal light reflex; Nose: mucosa pink and moist, septum midline; Throat/Mouth: Teeth present, mucosa pink and moist, no lesions or ulcerations noted.  Neck: Normal range of motion. Neck supple, trachea midline. No massses, lumps or thyromegaly present.  Cardiovascular: Normal rate and rhythm. S1,S2 noted.  No murmur, rubs or gallops noted. No JVD or BLE edema. No carotid bruits noted. Pulmonary/Chest: Normal effort and positive vesicular breath sounds. No respiratory distress. No wheezes, rales or ronchi noted.  Abdomen: Soft and nontender. Normal bowel sounds, no bruits noted. No distention or masses noted. Liver, spleen and kidneys non palpable. Skin: Healed brown lesions under left breast and mid back. No rashes, swelling, or ulcerations. Musculoskeletal: Normal range of motion. No signs of joint swelling. No difficulty with gait.  Neurological: Alert and oriented. Cranial nerves II-XII intact. Coordination normal. +DTRs bilaterally. Psychiatric: Mood and affect normal. Behavior is normal. Judgment and thought content normal.     Assessment and Plan: Shingles neuropathy Started Gabapentin 100mg  PO 1 tablet at bedtime  Hypertension: Blood pressure well controlled, continue current medications Check lipid panel  Hyperlipidemia Check lipid profile Continue  current medications Will call with lab results  Pre-diabetes/Metabolic Syndrome Check [id number] CMET with glucose level Continue current medication  Overweight  Discussed exercise and diet regimen Recommended changes on fried foods  Health Maintanence  Check CBC/CMET Followed up 3 months  Will call with lab results on Monday  Calena Salem S, Student-NP

## 2012-09-19 NOTE — Patient Instructions (Signed)

## 2012-09-20 LAB — VITAMIN D 25 HYDROXY (VIT D DEFICIENCY, FRACTURES): Vit D, 25-Hydroxy: 27 ng/mL — ABNORMAL LOW (ref 30–89)

## 2012-09-22 ENCOUNTER — Encounter: Payer: Self-pay | Admitting: Internal Medicine

## 2012-09-22 ENCOUNTER — Other Ambulatory Visit: Payer: Self-pay | Admitting: Internal Medicine

## 2012-09-22 DIAGNOSIS — E785 Hyperlipidemia, unspecified: Secondary | ICD-10-CM | POA: Insufficient documentation

## 2012-09-22 DIAGNOSIS — I10 Essential (primary) hypertension: Secondary | ICD-10-CM | POA: Insufficient documentation

## 2012-09-22 DIAGNOSIS — E119 Type 2 diabetes mellitus without complications: Secondary | ICD-10-CM | POA: Insufficient documentation

## 2012-09-22 MED ORDER — METFORMIN HCL 500 MG PO TABS: 500.0000 mg | ORAL_TABLET | Freq: Two times a day (BID) | ORAL | Status: DC

## 2012-09-22 MED ORDER — VITAMIN D (ERGOCALCIFEROL) 1.25 MG (50000 UNIT) PO CAPS: 50000.0000 [IU] | ORAL_CAPSULE | ORAL | Status: DC

## 2012-09-22 NOTE — Assessment & Plan Note (Signed)
Will recheck lipid profile today continue zocor Continue to work on diet and exercise

## 2012-09-22 NOTE — Assessment & Plan Note (Signed)
Will check A1C today Foot exam performed Continue current meds for now Focus on diet and exercise

## 2012-09-22 NOTE — Assessment & Plan Note (Signed)
Well controlled Will check CBC and BMET today Continue current therapy

## 2012-10-06 ENCOUNTER — Ambulatory Visit (INDEPENDENT_AMBULATORY_CARE_PROVIDER_SITE_OTHER): Payer: Medicare Other | Admitting: Internal Medicine

## 2012-10-06 ENCOUNTER — Encounter: Payer: Self-pay | Admitting: Internal Medicine

## 2012-10-06 VITALS — BP 130/68 | HR 81 | Temp 98.7°F | Wt 158.5 lb

## 2012-10-06 DIAGNOSIS — R198 Other specified symptoms and signs involving the digestive system and abdomen: Secondary | ICD-10-CM

## 2012-10-06 DIAGNOSIS — K219 Gastro-esophageal reflux disease without esophagitis: Secondary | ICD-10-CM

## 2012-10-06 DIAGNOSIS — R143 Flatulence: Secondary | ICD-10-CM

## 2012-10-06 DIAGNOSIS — R141 Gas pain: Secondary | ICD-10-CM

## 2012-10-06 MED ORDER — PANTOPRAZOLE SODIUM 40 MG PO TBEC: 40.0000 mg | DELAYED_RELEASE_TABLET | Freq: Every day | ORAL | Status: DC

## 2012-10-06 NOTE — Patient Instructions (Signed)
Diet for Diarrhea, Adult Frequent, runny stools (diarrhea) may be caused or worsened by food or drink. Diarrhea may be relieved by changing your diet. Since diarrhea can last up to 7 days, it is easy for you to lose too much fluid from the body and become dehydrated. Fluids that are lost need to be replaced. Along with a modified diet, make sure you drink enough fluids to keep your urine clear or pale yellow. DIET INSTRUCTIONS  Ensure adequate fluid intake (hydration): have 1 cup (8 oz) of fluid for each diarrhea episode. Avoid fluids that contain simple sugars or sports drinks, fruit juices, whole milk products, and sodas. Your urine should be clear or pale yellow if you are drinking enough fluids. Hydrate with an oral rehydration solution that you can purchase at pharmacies, retail stores, and online. You can prepare an oral rehydration solution at home by mixing the following ingredients together:    tsp table salt.   tsp baking soda.   tsp salt substitute containing potassium chloride.  1  tablespoons sugar.  1 L (34 oz) of water.  Certain foods and beverages may increase the speed at which food moves through the gastrointestinal (GI) tract. These foods and beverages should be avoided and include:  Caffeinated and alcoholic beverages.  High-fiber foods, such as raw fruits and vegetables, nuts, seeds, and whole grain breads and cereals.  Foods and beverages sweetened with sugar alcohols, such as xylitol, sorbitol, and mannitol.  Some foods may be well tolerated and may help thicken stool including:  Starchy foods, such as rice, toast, pasta, low-sugar cereal, oatmeal, grits, baked potatoes, crackers, and bagels.   Bananas.   Applesauce.  Add probiotic-rich foods to help increase healthy bacteria in the GI tract, such as yogurt and fermented milk products. RECOMMENDED FOODS AND BEVERAGES Starches Choose foods with less than 2 g of fiber per serving.  Recommended:  White,  [place], and pita breads, plain rolls, buns, bagels. Plain muffins, matzo. Soda, saltine, or graham crackers. Pretzels, melba toast, zwieback. Cooked cereals made with water: cornmeal, far[place], cream cereals. Dry cereals: refined corn, wheat, rice. Potatoes prepared any way without skins, refined macaroni, spaghetti, noodles, refined rice.  Avoid:  Bread, rolls, or crackers made with whole wheat, multi-grains, rye, bran seeds, nuts, or coconut. Corn tortillas or taco shells. Cereals containing whole grains, multi-grains, bran, coconut, nuts, raisins. Cooked or dry oatmeal. Coarse wheat cereals, granola. Cereals advertised as "high-fiber." Potato skins. Whole grain pasta, wild or brown rice. Popcorn. Sweet potatoes, yams. Sweet rolls, doughnuts, waffles, pancakes, sweet breads. Vegetables  Recommended: Strained tomato and vegetable juices. Most well-cooked and canned vegetables without seeds. Fresh: Tender lettuce, cucumber without the skin, cabbage, spinach, bean sprouts.  Avoid: Fresh, cooked, or canned: Artichokes, baked beans, beet greens, broccoli, Brussels sprouts, corn, kale, legumes, peas, sweet potatoes. Cooked: Green or red cabbage, spinach. Avoid large servings of any vegetables because vegetables shrink when cooked, and they contain more fiber per serving than fresh vegetables. Fruit  Recommended: Cooked or canned: Apricots, applesauce, cantaloupe, cherries, fruit cocktail, grapefruit, grapes, kiwi, mandarin oranges, peaches, pears, plums, watermelon. Fresh: Apples without skin, ripe banana, grapes, cantaloupe, cherries, grapefruit, peaches, oranges, plums. Keep servings limited to  cup or 1 piece.  Avoid: Fresh: Apples with skin, apricots, mangoes, pears, raspberries, strawberries. Prune juice, stewed or dried prunes. Dried fruits, raisins, dates. Large servings of all fresh fruits. Protein  Recommended: Ground or well-cooked tender beef, ham, veal, lamb, pork, or poultry. Eggs. Fish,  oysters, shrimp,  lobster, other seafoods. Liver, organ meats.  Avoid: Tough, fibrous meats with gristle. Peanut butter, smooth or chunky. Cheese, nuts, seeds, legumes, dried peas, beans, lentils. Dairy  Recommended: Yogurt, lactose-free milk, kefir, drinkable yogurt, buttermilk, soy milk, or plain hard cheese.  Avoid: Milk, chocolate milk, beverages made with milk, such as milkshakes. Soups  Recommended: Bouillon, broth, or soups made from allowed foods. Any strained soup.  Avoid: Soups made from vegetables that are not allowed, cream or milk-based soups. Desserts and Sweets  Recommended: Sugar-free gelatin, sugar-free frozen ice pops made without sugar alcohol.  Avoid: Plain cakes and cookies, pie made with fruit, pudding, custard, cream pie. Gelatin, fruit, ice, sherbet, frozen ice pops. Ice cream, ice milk without nuts. Plain hard candy, honey, jelly, molasses, syrup, sugar, chocolate syrup, gumdrops, marshmallows. Fats and Oils  Recommended: Limit fats to less than 8 tsp per day.  Avoid: Seeds, nuts, olives, avocados. Margarine, butter, cream, mayonnaise, salad oils, plain salad dressings. Plain gravy, crisp bacon without rind. Beverages  Recommended: Water, decaffeinated teas, oral rehydration solutions, sugar-free beverages not sweetened with sugar alcohols.  Avoid: Fruit juices, caffeinated beverages (coffee, tea, soda), alcohol, sports drinks, or lemon-lime soda. Condiments  Recommended: Ketchup, mustard, horseradish, vinegar, cocoa powder. Spices in moderation: allspice, basil, bay leaves, celery powder or leaves, cinnamon, cumin powder, curry powder, ginger, mace, marjoram, onion or garlic powder, oregano, paprika, parsley flakes, ground pepper, rosemary, sage, savory, tarragon, thyme, turmeric.  Avoid: Coconut, honey. Document Released: 03/10/2003 Document Revised: 09/12/2011 Document Reviewed: 05/04/2011 [facility] Patient Information 2014 [place]. Diet for  Gastroesophageal Reflux Disease, Adult Reflux (acid reflux) is when acid from your stomach flows up into the esophagus. When acid comes in contact with the esophagus, the acid causes irritation and soreness (inflammation) in the esophagus. When reflux happens often or so severely that it causes damage to the esophagus, it is called gastroesophageal reflux disease (GERD). Nutrition therapy can help ease the discomfort of GERD. FOODS OR DRINKS TO AVOID OR LIMIT  Smoking or chewing tobacco. Nicotine is one of the most potent stimulants to acid production in the gastrointestinal tract.  Caffeinated and decaffeinated coffee and black tea.  Regular or low-calorie carbonated beverages or energy drinks (caffeine-free carbonated beverages are allowed).   Strong spices, such as black pepper, white pepper, red pepper, cayenne, curry powder, and chili powder.  Peppermint or spearmint.  Chocolate.  High-fat foods, including meats and fried foods. Extra added fats including oils, butter, salad dressings, and nuts. Limit these to less than 8 tsp per day.  Fruits and vegetables if they are not tolerated, such as citrus fruits or tomatoes.  Alcohol.  Any food that seems to aggravate your condition. If you have questions regarding your diet, call your caregiver or a registered dietitian. OTHER THINGS THAT MAY HELP GERD INCLUDE:   Eating your meals slowly, in a relaxed setting.  Eating 5 to 6 small meals per day instead of 3 large meals.  Eliminating food for a period of time if it causes distress.  Not lying down until 3 hours after eating a meal.  Keeping the head of your bed raised 6 to 9 inches (15 to 23 cm) by using a foam wedge or blocks under the legs of the bed. Lying flat may make symptoms worse.  Being physically active. Weight loss may be helpful in reducing reflux in overweight or obese adults.  Wear loose fitting clothing EXAMPLE MEAL PLAN This meal plan is approximately 2,000  calories based on [email or web address] meal  planning guidelines. Breakfast   cup cooked oatmeal.  1 cup strawberries.  1 cup low-fat milk.  1 oz almonds. Snack  1 cup cucumber slices.  6 oz yogurt (made from low-fat or fat-free milk). Lunch  2 slice whole-wheat bread.  2 oz sliced [place].  2 tsp mayonnaise.  1 cup blueberries.  1 cup snap peas. Snack  6 whole-wheat crackers.  1 oz string cheese. Dinner   cup brown rice.  1 cup mixed veggies.  1 tsp olive oil.  3 oz grilled fish. Document Released: 12/18/2004 Document Revised: 03/12/2011 Document Reviewed: 11/03/2010 [facility] Patient Information 2014 [place], [place].

## 2012-10-06 NOTE — Progress Notes (Signed)
Subjective:    Patient ID: Kimberly Castillo, female    DOB: 1933-10-12, 79 y.o.   MRN: [id number]  HPI  Pt presents to the clinic today with c/o uncontrollable bowels, flatulance and reflux. This started about 3 weeks ago. She is alternating between constipation and diarrhea. Sometimes she is incontinent of bowel. She is having to wear depends all the time know.  She has not had a change in diet. Her Metformin was increased to BID around the same time. She does not know if this is causing her bowel issues. She did get some anti-diarrheal medication at the pharmacy along with some Gas-X. She has not tried either one. She denies blood in her stool. Additionally, she c/o reflux. It seems like every day she has heartburn. She is not sure what she is eating that is causing it. It is worse at night. She does consume some caffeine but no mints, alcohol or spicy foods. She does report eating more fried foods. She has not taken anything OTC for this.   Review of Systems      Past Medical History  Diagnosis Date  . Glaucoma   . Diabetes mellitus   . Hypertension   . Hyperlipidemia   . Arthritis     hands, back  . History of shingles   . Cataracts, bilateral   . A-fib     Current Outpatient Prescriptions  Medication Sig Dispense Refill  . amLODipine (NORVASC) 10 MG tablet Take 10 mg by mouth daily.      [name] azelastine (OPTIVAR) 0.05 % ophthalmic solution Place 1 drop into both eyes 2 (two) times daily.      . Diclofenac Sodium (PENNSAID) 1.5 % SOLN Place onto the skin 4 (four) times daily as needed.      . diltiazem (CARDIZEM) 120 MG tablet Take 120 mg by mouth daily.      [name] HYDROcodone-acetaminophen (LORCET) 10-650 MG per tablet Take 1 tablet by mouth every 8 (eight) hours as needed. pain      . hydrOXYzine (ATARAX/VISTARIL) 25 MG tablet Take 25 mg by mouth 3 (three) times daily as needed.      . latanoprost (XALATAN) 0.005 % ophthalmic solution Place 1 drop into both eyes at bedtime.      [name]  levobunolol (BETAGAN) 0.5 % ophthalmic solution Place 1 drop into both eyes 2 (two) times daily.      [name] lisinopril-hydrochlorothiazide (PRINZIDE,ZESTORETIC) 10-12.5 MG per tablet Take 1 tablet by mouth 2 (two) times daily.      . metFORMIN (GLUCOPHAGE) 500 MG tablet Take 1 tablet (500 mg total) by mouth 2 (two) times daily with a meal.  60 tablet  2  . potassium chloride (KLOR-CON) 20 MEQ packet Take 20 mEq by mouth daily.      . predniSONE (DELTASONE) 5 MG tablet Take 5 mg by mouth daily.      . simvastatin (ZOCOR) 40 MG tablet Take 40 mg by mouth at bedtime.      . traMADol (ULTRAM) 50 MG tablet Take 50 mg by mouth every 6 (six) hours as needed.      . valACYclovir (VALTREX) 500 MG tablet Take 500 mg by mouth 2 (two) times daily.      . Vitamin D, Ergocalciferol, (DRISDOL) 50000 UNITS CAPS capsule Take 1 capsule (50,000 Units total) by mouth every 7 (seven) days.  12 capsule  0  . zolpidem (AMBIEN) 5 MG tablet Take 5 mg by mouth at bedtime as needed. sleep  No current facility-administered medications for this visit.    No Known Allergies  Family History  Problem Relation Age of Onset  . Colon cancer Neg Hx   . Stomach cancer Neg Hx   . Hypertension Mother   . Hypertension Maternal Grandmother     History   Social History  . Marital Status: Single    Spouse Name: N/A    Number of Children: N/A  . Years of Education: N/A   Occupational History  . Not on file.   Social History Main Topics  . Smoking status: Former Smoker    Types: Cigarettes    Start date: 09/17/1991  . Smokeless tobacco: Never Used  . Alcohol Use: No  . Drug Use: No  . Sexual Activity: Yes   Other Topics Concern  . Not on file   Social History Narrative  . No narrative on file     Constitutional: Denies fever, malaise, fatigue, headache or abrupt weight changes.  Respiratory: Denies difficulty breathing, shortness of breath, cough or sputum production.   Cardiovascular: Denies chest pain,  chest tightness, palpitations or swelling in the hands or feet.  Gastrointestinal: Pt reports bowel changes and reflux. Denies abdominal pain, bloating, or blood in the stool.    No other specific complaints in a complete review of systems (except as listed in HPI above).  Objective:   Physical Exam   BP 130/68  Pulse 81  Temp(Src) 98.7 F (37.1 C) (Oral)  Wt 158 lb 8 oz (71.895 kg)  BMI 28.51 kg/m2  SpO2 98% Wt Readings from Last 3 Encounters:  10/06/12 158 lb 8 oz (71.895 kg)  09/19/12 162 lb (73.483 kg)  10/01/11 143 lb (64.864 kg)    General: Appears her stated age, overweight but well developed, well nourished in NAD. Cardiovascular: Normal rate and rhythm. S1,S2 noted.  No murmur, rubs or gallops noted. No JVD or BLE edema. No carotid bruits noted. Pulmonary/Chest: Normal effort and positive vesicular breath sounds. No respiratory distress. No wheezes, rales or ronchi noted.  Abdomen: Soft and mildly tender at epigastric area. Normal bowel sounds, no bruits noted. No distention or masses noted. Liver, spleen and kidneys non palpable.   BMET    Component Value Date/Time   NA 142 09/19/2012 1525   K 3.3* 09/19/2012 1525   CL 107 09/19/2012 1525   CO2 30 09/19/2012 1525   GLUCOSE 95 09/19/2012 1525   BUN 19 09/19/2012 1525   CREATININE 1.0 09/19/2012 1525   CALCIUM 9.1 09/19/2012 1525    Lipid Panel     Component Value Date/Time   CHOL 169 09/19/2012 1525   TRIG 129.0 09/19/2012 1525   HDL 58.20 09/19/2012 1525   CHOLHDL 3 09/19/2012 1525   VLDL 25.8 09/19/2012 1525   LDLCALC 85 09/19/2012 1525    CBC    Component Value Date/Time   WBC 6.9 09/19/2012 1525   RBC 3.37* 09/19/2012 1525   HGB 11.1* 09/19/2012 1525   HCT 32.9* 09/19/2012 1525   PLT 229.0 09/19/2012 1525   MCV 97.8 09/19/2012 1525   MCHC 33.6 09/19/2012 1525   RDW 15.1* 09/19/2012 1525    Hgb A1C Lab Results  Component Value Date   HGBA1C 6.6* 09/19/2012        Assessment & Plan:   Change in bowels  with incontinence, ? Related to increase in metformin:  Go ahead and take you Imodium and Gas-X Avoid lots of dairy, caffeine or spicy foods for now Information given about  diet for diarrhea Be sure to drink plenty of water to avoid dehydration  If no better in 1-2 weeks, consider decreasing the Metformin

## 2012-10-06 NOTE — Assessment & Plan Note (Signed)
Information for diet for GERD given eRx for protonix 40 mg daily

## 2012-12-19 ENCOUNTER — Other Ambulatory Visit (INDEPENDENT_AMBULATORY_CARE_PROVIDER_SITE_OTHER): Payer: Medicare Other

## 2012-12-19 ENCOUNTER — Encounter: Payer: Self-pay | Admitting: Internal Medicine

## 2012-12-19 ENCOUNTER — Ambulatory Visit (INDEPENDENT_AMBULATORY_CARE_PROVIDER_SITE_OTHER): Payer: Medicare Other | Admitting: Internal Medicine

## 2012-12-19 ENCOUNTER — Ambulatory Visit: Payer: Medicare Other | Admitting: Internal Medicine

## 2012-12-19 VITALS — BP 130/68 | HR 86 | Temp 99.0°F | Ht 62.0 in | Wt 166.4 lb

## 2012-12-19 DIAGNOSIS — M545 Low back pain, unspecified: Secondary | ICD-10-CM

## 2012-12-19 DIAGNOSIS — Z0000 Encounter for general adult medical examination without abnormal findings: Secondary | ICD-10-CM

## 2012-12-19 DIAGNOSIS — B0229 Other postherpetic nervous system involvement: Secondary | ICD-10-CM

## 2012-12-19 DIAGNOSIS — M47816 Spondylosis without myelopathy or radiculopathy, lumbar region: Secondary | ICD-10-CM

## 2012-12-19 DIAGNOSIS — G8929 Other chronic pain: Secondary | ICD-10-CM | POA: Insufficient documentation

## 2012-12-19 DIAGNOSIS — E119 Type 2 diabetes mellitus without complications: Secondary | ICD-10-CM

## 2012-12-19 DIAGNOSIS — Z23 Encounter for immunization: Secondary | ICD-10-CM

## 2012-12-19 DIAGNOSIS — H409 Unspecified glaucoma: Secondary | ICD-10-CM | POA: Insufficient documentation

## 2012-12-19 DIAGNOSIS — I4891 Unspecified atrial fibrillation: Secondary | ICD-10-CM | POA: Insufficient documentation

## 2012-12-19 HISTORY — DX: Other chronic pain: G89.29

## 2012-12-19 HISTORY — DX: Spondylosis without myelopathy or radiculopathy, lumbar region: M47.816

## 2012-12-19 HISTORY — DX: Low back pain, unspecified: M54.50

## 2012-12-19 HISTORY — DX: Other postherpetic nervous system involvement: B02.29

## 2012-12-19 LAB — URINALYSIS, ROUTINE W REFLEX MICROSCOPIC
Bilirubin Urine: NEGATIVE
Nitrite: NEGATIVE
Total Protein, Urine: 30 — AB

## 2012-12-19 LAB — LIPID PANEL
Total CHOL/HDL Ratio: 4
VLDL: 103 mg/dL — ABNORMAL HIGH (ref 0.0–40.0)

## 2012-12-19 LAB — HEPATIC FUNCTION PANEL
ALT: 16 U/L (ref 0–35)
AST: 14 U/L (ref 0–37)
Alkaline Phosphatase: 64 U/L (ref 39–117)
Bilirubin, Direct: 0.1 mg/dL (ref 0.0–0.3)
Total Bilirubin: 0.5 mg/dL (ref 0.3–1.2)

## 2012-12-19 LAB — CBC WITH DIFFERENTIAL/PLATELET
Basophils Relative: 0.4 % (ref 0.0–3.0)
Eosinophils Absolute: 0 10*3/uL (ref 0.0–0.7)
MCHC: 33.6 g/dL (ref 30.0–36.0)
MCV: 94.9 fl (ref 78.0–100.0)
Monocytes Absolute: 0.5 10*3/uL (ref 0.1–1.0)
Neutrophils Relative %: 64.6 % (ref 43.0–77.0)
Platelets: 236 10*3/uL (ref 150.0–400.0)
RBC: 3.76 Mil/uL — ABNORMAL LOW (ref 3.87–5.11)
RDW: 16 % — ABNORMAL HIGH (ref 11.5–14.6)

## 2012-12-19 LAB — MICROALBUMIN / CREATININE URINE RATIO
Creatinine,U: 254.3 mg/dL
Microalb, Ur: 20.1 mg/dL — ABNORMAL HIGH (ref 0.0–1.9)

## 2012-12-19 LAB — BASIC METABOLIC PANEL
Chloride: 108 mEq/L (ref 96–112)
Potassium: 3.5 mEq/L (ref 3.5–5.1)

## 2012-12-19 LAB — LDL CHOLESTEROL, DIRECT: Direct LDL: 94.2 mg/dL

## 2012-12-19 MED ORDER — HYDROCODONE-ACETAMINOPHEN 10-325 MG PO TABS
1.0000 | ORAL_TABLET | Freq: Two times a day (BID) | ORAL | Status: DC | PRN
Start: 2012-12-19 — End: 2012-12-19

## 2012-12-19 MED ORDER — VALACYCLOVIR HCL 500 MG PO TABS: 500.0000 mg | ORAL_TABLET | Freq: Two times a day (BID) | ORAL | Status: DC

## 2012-12-19 MED ORDER — HYDROCODONE-ACETAMINOPHEN 10-325 MG PO TABS: 1.0000 | ORAL_TABLET | Freq: Two times a day (BID) | ORAL | Status: DC | PRN

## 2012-12-19 MED ORDER — DILTIAZEM HCL 120 MG PO TABS: 120.0000 mg | ORAL_TABLET | Freq: Every day | ORAL | Status: DC

## 2012-12-19 NOTE — Assessment & Plan Note (Signed)

## 2012-12-19 NOTE — Progress Notes (Signed)
Pre-visit discussion using our clinic review tool. No additional management support is needed unless otherwise documented below in the visit note.  

## 2012-12-19 NOTE — Progress Notes (Signed)
Subjective:    Patient ID: Kimberly Castillo, female    DOB: 05/27/1933, 79 y.o.   MRN: [id number]  HPI  Here for wellness and f/u;  Overall doing ok;  Pt denies CP, worsening SOB, DOE, wheezing, orthopnea, PND, worsening LE edema, palpitations, dizziness or syncope.  Pt denies neurological change such as new headache, facial or extremity weakness.  Pt denies polydipsia, polyuria, or low sugar symptoms. Pt states overall good compliance with treatment and medications, good tolerability, and has been trying to follow lower cholesterol diet.  Pt denies worsening depressive symptoms, suicidal ideation or panic. No fever, night sweats, wt loss, loss of appetite, or other constitutional symptoms.  Pt states good ability with ADL's, has low fall risk, home safety reviewed and adequate, no other significant changes in hearing or vision, and only occasionally active with exercise.  Overall pain stable.  Has chronie LLE venous insuff swelling no change Past Medical History  Diagnosis Date  . Glaucoma   . Diabetes mellitus   . Hypertension   . Hyperlipidemia   . Arthritis     hands, back  . History of shingles   . Cataracts, bilateral   . A-fib   . Post herpetic neuralgia 12/19/2012  . Chronic lower back pain 12/19/2012  . Arthritis, lumbar spine 12/19/2012   Past Surgical History  Procedure Laterality Date  . Incision and drainage breast abscess  1958    left  . Abdominal hysterectomy  1964    partial    reports that she has quit smoking. Her smoking use included Cigarettes. She started smoking about 21 years ago. She smoked 0.00 packs per day. She has never used smokeless tobacco. She reports that she does not drink alcohol or use illicit drugs. family history includes Hypertension in her maternal grandmother and mother. There is no history of Colon cancer or Stomach cancer. No Known Allergies Current Outpatient Prescriptions on File Prior to Visit  Medication Sig Dispense Refill  . azelastine  (OPTIVAR) 0.05 % ophthalmic solution Place 1 drop into both eyes 2 (two) times daily.      . hydrOXYzine (ATARAX/VISTARIL) 25 MG tablet Take 25 mg by mouth 3 (three) times daily as needed.      . latanoprost (XALATAN) 0.005 % ophthalmic solution Place 1 drop into both eyes at bedtime.      [name] levobunolol (BETAGAN) 0.5 % ophthalmic solution Place 1 drop into both eyes 2 (two) times daily.      [name] lisinopril-hydrochlorothiazide (PRINZIDE,ZESTORETIC) 10-12.5 MG per tablet Take 1 tablet by mouth 2 (two) times daily.      . metFORMIN (GLUCOPHAGE) 500 MG tablet Take 1 tablet (500 mg total) by mouth 2 (two) times daily with a meal.  60 tablet  2  . pantoprazole (PROTONIX) 40 MG tablet Take 1 tablet (40 mg total) by mouth daily.  30 tablet  3  . potassium chloride (KLOR-CON) 20 MEQ packet Take 20 mEq by mouth daily.      . predniSONE (DELTASONE) 5 MG tablet Take 5 mg by mouth daily.      . simvastatin (ZOCOR) 40 MG tablet Take 40 mg by mouth at bedtime.      [name] zolpidem (AMBIEN) 5 MG tablet Take 5 mg by mouth at bedtime as needed. sleep       No current facility-administered medications on file prior to visit.   Review of Systems  Constitutional: Negative for unexpected weight change, or unusual diaphoresis  HENT: Negative for tinnitus.  Eyes: Negative for photophobia and visual disturbance.  Respiratory: Negative for choking and stridor.   Gastrointestinal: Negative for vomiting and blood in stool.  Genitourinary: Negative for hematuria and decreased urine volume.  Musculoskeletal: Negative for acute joint swelling Skin: Negative for color change and wound.  Neurological: Negative for tremors and numbness other than noted  Psychiatric/Behavioral: Negative for decreased concentration or  hyperactivity.       Objective:   Physical Exam  BP 130/68  Pulse 86  Temp(Src) 99 F (37.2 C) (Oral)  Ht 5\' 2"  (1.575 m)  Wt 166 lb 6 oz (75.467 kg)  BMI 30.42 kg/m2  SpO2 96% VS noted,  Constitutional:  Pt appears well-developed and well-nourished.  HENT: Head: NCAT.  Right Ear: External ear normal.  Left Ear: External ear normal.  Eyes: Conjunctivae and EOM are normal. Pupils are equal, round, and reactive to light.  Neck: Normal range of motion. Neck supple.  Cardiovascular: Normal rate and regular rhythm.   Pulmonary/Chest: Effort normal and breath sounds normal.  Abd:  Soft, NT, non-distended, + BS Neurological: Pt is alert. Not confused  Skin: Skin is warm. No erythema.  Psychiatric: Pt behavior is normal. Thought content normal.        Assessment & Plan:

## 2012-12-19 NOTE — Patient Instructions (Addendum)
You had the Prevnar pneumonia shot  Please continue all other medications as before, and refills have been done if requested - the pain medication Please have the pharmacy call with any other refills you may need.  You are given the letter today explaining the transitional pain medication refill policy due to recent change in [place] Law and [place] Regulations  Please be aware that I will no longer be able to offer monthly refills of any Schedule II or higher medication starting Feb 01, 2013  Please go to the LAB in the Basement (turn left off the elevator) for the tests to be done today You will be contacted by phone if any changes need to be made immediately.  Otherwise, you will receive a letter about your results with an explanation, but please check with MyChart first.  Please return in 6 months, or sooner if needed, with Lab testing done 3-5 days before

## 2012-12-21 NOTE — Assessment & Plan Note (Signed)
For pain med refill, pt notified I will no longer responsible for pain management after feb 1

## 2012-12-21 NOTE — Assessment & Plan Note (Signed)
stable overall by history and exam, recent data reviewed with pt, and pt to continue medical treatment as before,  to f/u any worsening symptoms or concerns Lab Results  Component Value Date   HGBA1C 7.1* 12/19/2012

## 2012-12-23 ENCOUNTER — Encounter: Payer: Self-pay | Admitting: Internal Medicine

## 2013-01-20 ENCOUNTER — Telehealth: Payer: Self-pay

## 2013-01-20 DIAGNOSIS — K219 Gastro-esophageal reflux disease without esophagitis: Secondary | ICD-10-CM

## 2013-01-20 MED ORDER — POTASSIUM CHLORIDE 20 MEQ PO PACK
20.0000 meq | PACK | Freq: Every day | ORAL | Status: DC
Start: 2013-01-20 — End: 2013-02-10

## 2013-01-20 MED ORDER — PANTOPRAZOLE SODIUM 40 MG PO TBEC: 40.0000 mg | DELAYED_RELEASE_TABLET | Freq: Every day | ORAL | Status: DC

## 2013-01-20 MED ORDER — POTASSIUM CHLORIDE 20 MEQ PO PACK: 20.0000 meq | PACK | Freq: Every day | ORAL | Status: DC

## 2013-01-20 NOTE — Telephone Encounter (Signed)
I believe pt is on diltiazem (cardizem) so no need for amlodipine  OK to let pt know

## 2013-01-20 NOTE — Telephone Encounter (Signed)
Patient called requesting refill on Amlodipine 10 mg, but do not see on current list.  Advise please

## 2013-01-20 NOTE — Telephone Encounter (Signed)
Called left message to call back 

## 2013-01-21 NOTE — Telephone Encounter (Signed)
Called the patient left a detailed message of MD instructions on medication.

## 2013-02-02 ENCOUNTER — Telehealth: Payer: Self-pay

## 2013-02-02 NOTE — Telephone Encounter (Signed)
error 

## 2013-02-10 ENCOUNTER — Telehealth: Payer: Self-pay

## 2013-02-10 MED ORDER — POTASSIUM CHLORIDE CRYS ER 20 MEQ PO TBCR: 20.0000 meq | EXTENDED_RELEASE_TABLET | Freq: Every day | ORAL | Status: DC

## 2013-02-10 NOTE — Telephone Encounter (Signed)
Potassium chloride 20 meq packet, patients insurance does not cover the packet, is is ok to fill tablets??

## 2013-02-10 NOTE — Telephone Encounter (Signed)
Medication has been filled as pharmacy requested.

## 2013-02-10 NOTE — Telephone Encounter (Signed)
yes

## 2013-02-13 NOTE — Telephone Encounter (Signed)
The patient called today (02/13/13) left message she has been trying to fill her BP medication for a couple weeks.  Called her back on both numbers listed in chart to follow-up on request.  There was no answer on both numbers, did leave a detailed message of reason for call and to call us back.

## 2013-02-13 NOTE — Telephone Encounter (Signed)
The patient returned my call.  She is requesting amlodipine 10 mg to be refilled (read all of phone note).  I did inform why it has not been filled.  She states she does take the Diltiazem, but thought her previous MD prescribed that med. For fluid and migraines.  She was unaware of taking it for BP.  Advise on refill of amlodipine and if to continue Diltiazem??

## 2013-02-13 NOTE — Telephone Encounter (Signed)
Please consider OV

## 2013-02-13 NOTE — Telephone Encounter (Signed)
Only one med is appropriate as they are similar.  Should only be taking one.

## 2013-02-13 NOTE — Telephone Encounter (Signed)
Informed patient of MD instructions.  The patient did state she is having a lot of swelling all over, feet and legs hurt due to fluid.  Please advise

## 2013-02-13 NOTE — Telephone Encounter (Signed)
Called left message to call back 

## 2013-02-18 NOTE — Telephone Encounter (Signed)
Called left message to call back 

## 2013-02-18 NOTE — Telephone Encounter (Signed)
Called the patient left a detailed message of MD instructions.

## 2013-03-12 ENCOUNTER — Encounter: Payer: Self-pay | Admitting: Internal Medicine

## 2013-03-12 ENCOUNTER — Ambulatory Visit (INDEPENDENT_AMBULATORY_CARE_PROVIDER_SITE_OTHER): Payer: Medicare Other | Admitting: Internal Medicine

## 2013-03-12 VITALS — BP 150/82 | HR 96 | Temp 99.0°F | Ht 62.0 in | Wt 163.4 lb

## 2013-03-12 DIAGNOSIS — G8929 Other chronic pain: Secondary | ICD-10-CM

## 2013-03-12 DIAGNOSIS — M545 Low back pain, unspecified: Secondary | ICD-10-CM

## 2013-03-12 DIAGNOSIS — M47817 Spondylosis without myelopathy or radiculopathy, lumbosacral region: Secondary | ICD-10-CM

## 2013-03-12 DIAGNOSIS — E785 Hyperlipidemia, unspecified: Secondary | ICD-10-CM

## 2013-03-12 DIAGNOSIS — E119 Type 2 diabetes mellitus without complications: Secondary | ICD-10-CM

## 2013-03-12 DIAGNOSIS — I10 Essential (primary) hypertension: Secondary | ICD-10-CM

## 2013-03-12 DIAGNOSIS — G894 Chronic pain syndrome: Secondary | ICD-10-CM

## 2013-03-12 DIAGNOSIS — M47816 Spondylosis without myelopathy or radiculopathy, lumbar region: Secondary | ICD-10-CM

## 2013-03-12 MED ORDER — NORTRIPTYLINE HCL 10 MG PO CAPS: ORAL_CAPSULE | ORAL | Status: DC

## 2013-03-12 MED ORDER — LOSARTAN POTASSIUM-HCTZ 100-25 MG PO TABS: 1.0000 | ORAL_TABLET | Freq: Every day | ORAL | Status: DC

## 2013-03-12 MED ORDER — HYDROCODONE-ACETAMINOPHEN 10-325 MG PO TABS: 1.0000 | ORAL_TABLET | Freq: Two times a day (BID) | ORAL | Status: DC | PRN

## 2013-03-12 MED ORDER — DULOXETINE HCL 60 MG PO CPEP: 60.0000 mg | ORAL_CAPSULE | Freq: Every day | ORAL | Status: DC

## 2013-03-12 MED ORDER — SIMVASTATIN 40 MG PO TABS: 40.0000 mg | ORAL_TABLET | Freq: Every day | ORAL | Status: DC

## 2013-03-12 NOTE — Patient Instructions (Signed)
Please take all new medication as prescribed - the cymbalta at 30 mg per day, which helps arthritic pain and nerve pain, as well for stress and low mood  Please take all new medication as prescribed - the nortryptilene 10 mg - to start one pill at bedtime for 1 week, then 2 pills at bedtime for 1 week, then 3 pills at bedtime after that  Please continue all other medications as before, including the losartan-HCT Please have the pharmacy call with any other refills you may need.  You are given the pain medication refill today  You are given the letter today explaining the transitional pain medication refill policy  Please be aware that I will no longer be able to offer monthly refills of any Schedule II or higher medication after today  You will be contacted regarding the referral for: pain clinic  Please return in 6 months, or sooner if needed

## 2013-03-12 NOTE — Progress Notes (Addendum)
Subjective:    Patient ID: Kimberly Castillo, female    DOB: 1933-01-29, 80 y.o.   MRN: [id number]  HPI  Here to f/u; overall doing ok,  Pt denies chest pain, increased sob or doe, wheezing, orthopnea, PND, increased LE swelling, palpitations, dizziness or syncope.  Pt denies polydipsia, polyuria, or low sugar symptoms such as weakness or confusion improved with po intake.  Pt denies new neurological symptoms such as new headache, or facial or extremity weakness or numbness.   Pt states overall good compliance with meds, has been trying to follow lower cholesterol diet, with wt overall stable,  but little exercise however.  Pt continues to have recurring LBP without change in severity, bowel or bladder change, fever, wt loss,  worsening LE pain/numbness/weakness, gait change or falls. Also with ongoing persitent post herpetic pain x 66yrs.  Also with ongoing arthritic pains as well.  Also with daily HA's for 3 wks, frontal with throbbing, nausea but no photophobia, vomiting, fever.  BP at home usually < 140/90 Past Medical History  Diagnosis Date  . Glaucoma   . Diabetes mellitus   . Hypertension   . Hyperlipidemia   . Arthritis     hands, back  . History of shingles   . Cataracts, bilateral   . A-fib   . Post herpetic neuralgia 12/19/2012  . Chronic lower back pain 12/19/2012  . Arthritis, lumbar spine 12/19/2012   Past Surgical History  Procedure Laterality Date  . Incision and drainage breast abscess  1958    left  . Abdominal hysterectomy  1964    partial    reports that she has quit smoking. Her smoking use included Cigarettes. She started smoking about 21 years ago. She smoked 0.00 packs per day. She has never used smokeless tobacco. She reports that she does not drink alcohol or use illicit drugs. family history includes Hypertension in her maternal grandmother and mother. There is no history of Colon cancer or Stomach cancer. No Known Allergies Current Outpatient Prescriptions on  File Prior to Visit  Medication Sig Dispense Refill  . azelastine (OPTIVAR) 0.05 % ophthalmic solution Place 1 drop into both eyes 2 (two) times daily.      [name] diltiazem (CARDIZEM) 120 MG tablet Take 1 tablet (120 mg total) by mouth daily.  90 tablet  3  . hydrOXYzine (ATARAX/VISTARIL) 25 MG tablet Take 25 mg by mouth 3 (three) times daily as needed.      . latanoprost (XALATAN) 0.005 % ophthalmic solution Place 1 drop into both eyes at bedtime.      [name] levobunolol (BETAGAN) 0.5 % ophthalmic solution Place 1 drop into both eyes 2 (two) times daily.      . metFORMIN (GLUCOPHAGE) 500 MG tablet Take 1 tablet (500 mg total) by mouth 2 (two) times daily with a meal.  60 tablet  2  . pantoprazole (PROTONIX) 40 MG tablet Take 1 tablet (40 mg total) by mouth daily.  90 tablet  3  . potassium chloride SA (K-DUR,KLOR-CON) 20 MEQ tablet Take 1 tablet (20 mEq total) by mouth daily.  90 tablet  3  . predniSONE (DELTASONE) 5 MG tablet Take 5 mg by mouth daily.      . valACYclovir (VALTREX) 500 MG tablet Take 1 tablet (500 mg total) by mouth 2 (two) times daily.  180 tablet  3  . zolpidem (AMBIEN) 5 MG tablet Take 5 mg by mouth at bedtime as needed. sleep       No  current facility-administered medications on file prior to visit.     Review of Systems  Constitutional: Negative for unexpected weight change, or unusual diaphoresis  HENT: Negative for tinnitus.   Eyes: Negative for photophobia and visual disturbance.  Respiratory: Negative for choking and stridor.   Gastrointestinal: Negative for vomiting and blood in stool.  Genitourinary: Negative for hematuria and decreased urine volume.  Musculoskeletal: Negative for acute joint swelling Skin: Negative for color change and wound.  Neurological: Negative for tremors and numbness other than noted  Psychiatric/Behavioral: Negative for decreased concentration or  hyperactivity.       Objective:   Physical Exam BP 150/82  Pulse 96  Temp(Src) 99 F (37.2  C) (Oral)  Ht 5\' 2"  (1.575 m)  Wt 163 lb 6 oz (74.106 kg)  BMI 29.87 kg/m2  SpO2 94% VS noted,  Constitutional: Pt appears well-developed and well-nourished.  HENT: Head: NCAT.  Right Ear: External ear normal.  Left Ear: External ear normal.  Eyes: Conjunctivae and EOM are normal. Pupils are equal, round, and reactive to light.  Neck: Normal range of motion. Neck supple.  Cardiovascular: Normal rate and regular rhythm.   Pulmonary/Chest: Effort normal and breath sounds normal.  Abd:  Soft, NT, non-distended, + BS Spine nontender, has bilat lumbar paravertebral tender without red/swelling/rash Neurological: Pt is alert. Not confused  Skin: Skin is warm. No erythema.  Psychiatric: Pt behavior is normal. Thought content normal.     Assessment & Plan:

## 2013-03-12 NOTE — Progress Notes (Signed)
Pre visit review using our clinic review tool, if applicable. No additional management support is needed unless otherwise documented below in the visit note. 

## 2013-03-14 NOTE — Assessment & Plan Note (Signed)
Also for cymbalta 30 qd trial for pain

## 2013-03-14 NOTE — Assessment & Plan Note (Addendum)
For pain med refill, pain clinic referral for longer term management  Note:  Total time for pt hx, exam, review of record with pt in the room, determination of diagnoses and plan for further eval and tx is > 40 min, with over 50% spent in coordination and counseling of patient

## 2013-03-14 NOTE — Assessment & Plan Note (Signed)
stable overall by history and exam, recent data reviewed with pt, and pt to continue medical treatment as before,  to f/u any worsening symptoms or concerns Lab Results  Component Value Date   HGBA1C 7.1* 12/19/2012

## 2013-03-14 NOTE — Assessment & Plan Note (Signed)
stable overall by history and exam, recent data reviewed with pt, and pt to continue medical treatment as before,  to f/u any worsening symptoms or concerns BP Readings from Last 3 Encounters:  03/12/13 150/82  12/19/12 130/68  10/06/12 130/68

## 2013-03-14 NOTE — Assessment & Plan Note (Signed)
stable overall by history and exam, recent data reviewed with pt, and pt to continue medical treatment as before,  to f/u any worsening symptoms or concerns Lab Results  Component Value Date   LDLCALC 85 09/19/2012

## 2013-04-02 ENCOUNTER — Encounter: Payer: Self-pay | Admitting: Internal Medicine

## 2013-06-09 ENCOUNTER — Other Ambulatory Visit: Payer: Self-pay

## 2013-06-09 MED ORDER — LOSARTAN POTASSIUM-HCTZ 100-25 MG PO TABS: 1.0000 | ORAL_TABLET | Freq: Every day | ORAL | Status: DC

## 2013-06-09 MED ORDER — SIMVASTATIN 40 MG PO TABS: 40.0000 mg | ORAL_TABLET | Freq: Every day | ORAL | Status: DC

## 2013-06-17 ENCOUNTER — Other Ambulatory Visit (INDEPENDENT_AMBULATORY_CARE_PROVIDER_SITE_OTHER): Payer: Medicare Other

## 2013-06-17 DIAGNOSIS — E119 Type 2 diabetes mellitus without complications: Secondary | ICD-10-CM

## 2013-06-17 LAB — LIPID PANEL
CHOLESTEROL: 177 mg/dL (ref 0–200)
HDL: 51 mg/dL (ref >39.00)
LDL Cholesterol: 91 mg/dL (ref 0–99)
NonHDL: 126
Total CHOL/HDL Ratio: 3
Triglycerides: 177 mg/dL — ABNORMAL HIGH (ref 0.0–149.0)
VLDL: 35.4 mg/dL (ref 0.0–40.0)

## 2013-06-17 LAB — BASIC METABOLIC PANEL
BUN: 30 mg/dL — AB (ref 6–23)
CO2: 29 meq/L (ref 19–32)
CREATININE: 1.3 mg/dL — AB (ref 0.4–1.2)
Calcium: 9.6 mg/dL (ref 8.4–10.5)
Chloride: 108 mEq/L (ref 96–112)
GFR: 50 mL/min — ABNORMAL LOW (ref >60.00)
GLUCOSE: 131 mg/dL — AB (ref 70–99)
POTASSIUM: 3.4 meq/L — AB (ref 3.5–5.1)
Sodium: 145 mEq/L (ref 135–145)

## 2013-06-17 LAB — HEMOGLOBIN A1C: HEMOGLOBIN A1C: 6.3 % (ref 4.6–6.5)

## 2013-06-17 LAB — HEPATIC FUNCTION PANEL
ALT: 16 U/L (ref 0–35)
AST: 19 U/L (ref 0–37)
Albumin: 3.8 g/dL (ref 3.5–5.2)
Alkaline Phosphatase: 67 U/L (ref 39–117)
BILIRUBIN DIRECT: 0.1 mg/dL (ref 0.0–0.3)
Total Bilirubin: 0.6 mg/dL (ref 0.2–1.2)
Total Protein: 7.3 g/dL (ref 6.0–8.3)

## 2013-06-19 ENCOUNTER — Encounter: Payer: Self-pay | Admitting: Internal Medicine

## 2013-06-19 ENCOUNTER — Ambulatory Visit (INDEPENDENT_AMBULATORY_CARE_PROVIDER_SITE_OTHER): Payer: Medicare Other | Admitting: Internal Medicine

## 2013-06-19 VITALS — BP 118/80 | HR 77 | Temp 98.5°F | Ht 62.0 in | Wt 158.1 lb

## 2013-06-19 DIAGNOSIS — E119 Type 2 diabetes mellitus without complications: Secondary | ICD-10-CM

## 2013-06-19 DIAGNOSIS — G8929 Other chronic pain: Secondary | ICD-10-CM

## 2013-06-19 DIAGNOSIS — M545 Low back pain, unspecified: Secondary | ICD-10-CM

## 2013-06-19 DIAGNOSIS — I10 Essential (primary) hypertension: Secondary | ICD-10-CM

## 2013-06-19 DIAGNOSIS — Z0000 Encounter for general adult medical examination without abnormal findings: Secondary | ICD-10-CM

## 2013-06-19 DIAGNOSIS — E1165 Type 2 diabetes mellitus with hyperglycemia: Secondary | ICD-10-CM

## 2013-06-19 DIAGNOSIS — IMO0001 Reserved for inherently not codable concepts without codable children: Secondary | ICD-10-CM

## 2013-06-19 DIAGNOSIS — E785 Hyperlipidemia, unspecified: Secondary | ICD-10-CM

## 2013-06-19 MED ORDER — HYDROXYZINE HCL 25 MG PO TABS: 25.0000 mg | ORAL_TABLET | Freq: Three times a day (TID) | ORAL | Status: DC | PRN

## 2013-06-19 MED ORDER — NORTRIPTYLINE HCL 10 MG PO CAPS
ORAL_CAPSULE | ORAL | Status: DC
Start: 2013-06-19 — End: 2015-07-12

## 2013-06-19 MED ORDER — METFORMIN HCL 500 MG PO TABS: 500.0000 mg | ORAL_TABLET | Freq: Two times a day (BID) | ORAL | Status: DC

## 2013-06-19 MED ORDER — HYDROCODONE-ACETAMINOPHEN 10-325 MG PO TABS: 1.0000 | ORAL_TABLET | Freq: Two times a day (BID) | ORAL | Status: DC | PRN

## 2013-06-19 MED ORDER — VALACYCLOVIR HCL 500 MG PO TABS: 500.0000 mg | ORAL_TABLET | Freq: Two times a day (BID) | ORAL | Status: DC

## 2013-06-19 NOTE — Patient Instructions (Addendum)
Please continue all other medications as before, and refills have been done if requested - the hydrocodone, and pamelor  Your lab work was OK today  Please have the pharmacy call with any other refills you may need.  Please continue your efforts at being more active, low cholesterol diet, and weight control.  You are otherwise up to date with prevention measures today.  Please keep your appointments with your specialists as you may have planned  Please return in 6 months, or sooner if needed, with Lab testing done 3-5 days before

## 2013-06-19 NOTE — Progress Notes (Signed)
Subjective:    Patient ID: Kimberly Castillo, female    DOB: 03/07/1933, 80 y.o.   MRN: [id number]  HPI  Here to f/u; overall doing ok,  Pt denies chest pain, increased sob or doe, wheezing, orthopnea, PND, increased LE swelling, palpitations, dizziness or syncope.  Pt denies polydipsia, polyuria, or low sugar symptoms such as weakness or confusion improved with po intake.  Pt denies new neurological symptoms such as new headache, or facial or extremity weakness or numbness.   Pt states overall good compliance with meds, has been trying to follow lower cholesterol, diabetic diet, with wt overall stable,  but little exercise however.  Fell off porch 2night ago, has some stiffness and sore but has FROM.  Cymbalta too expensive so not taking for pain.  Celebrex over$100 from dr Marla Roe but plans to address with him. Only taking the hydrocodone intermittently, #60 for 3 mo, not asking for escalating dosing. Past Medical History  Diagnosis Date  . Glaucoma   . Diabetes mellitus   . Hypertension   . Hyperlipidemia   . Arthritis     hands, back  . History of shingles   . Cataracts, bilateral   . A-fib   . Post herpetic neuralgia 12/19/2012  . Chronic lower back pain 12/19/2012  . Arthritis, lumbar spine 12/19/2012   Past Surgical History  Procedure Laterality Date  . Incision and drainage breast abscess  1958    left  . Abdominal hysterectomy  1964    partial    reports that she has quit smoking. Her smoking use included Cigarettes. She started smoking about 21 years ago. She smoked 0.00 packs per day. She has never used smokeless tobacco. She reports that she does not drink alcohol or use illicit drugs. family history includes Hypertension in her maternal grandmother and mother. There is no history of Colon cancer or Stomach cancer. No Known Allergies Current Outpatient Prescriptions on File Prior to Visit  Medication Sig Dispense Refill  . azelastine (OPTIVAR) 0.05 % ophthalmic solution  Place 1 drop into both eyes 2 (two) times daily.      [name] diltiazem (CARDIZEM) 120 MG tablet Take 1 tablet (120 mg total) by mouth daily.  90 tablet  3  . latanoprost (XALATAN) 0.005 % ophthalmic solution Place 1 drop into both eyes at bedtime.      [name] levobunolol (BETAGAN) 0.5 % ophthalmic solution Place 1 drop into both eyes 2 (two) times daily.      [name] losartan-hydrochlorothiazide (HYZAAR) 100-25 MG per tablet Take 1 tablet by mouth daily.  90 tablet  3  . pantoprazole (PROTONIX) 40 MG tablet Take 1 tablet (40 mg total) by mouth daily.  90 tablet  3  . potassium chloride SA (K-DUR,KLOR-CON) 20 MEQ tablet Take 1 tablet (20 mEq total) by mouth daily.  90 tablet  3  . simvastatin (ZOCOR) 40 MG tablet Take 1 tablet (40 mg total) by mouth at bedtime.  90 tablet  3  . zolpidem (AMBIEN) 5 MG tablet Take 5 mg by mouth at bedtime as needed. sleep       No current facility-administered medications on file prior to visit.    Review of Systems  Constitutional: Negative for unusual diaphoresis or other sweats  HENT: Negative for ringing in ear Eyes: Negative for double vision or worsening visual disturbance.  Respiratory: Negative for choking and stridor.   Gastrointestinal: Negative for vomiting or other signifcant bowel change Genitourinary: Negative for hematuria or decreased urine volume.  Musculoskeletal: Negative for other MSK pain or swelling Skin: Negative for color change and worsening wound.  Neurological: Negative for tremors and numbness other than noted  Psychiatric/Behavioral: Negative for decreased concentration or agitation other than above       Objective:   Physical Exam BP 118/80  Pulse 77  Temp(Src) 98.5 F (36.9 C) (Oral)  Ht 5\' 2"  (1.575 m)  Wt 158 lb 2 oz (71.725 kg)  BMI 28.91 kg/m2  SpO2 97% VS noted,  Constitutional: Pt appears well-developed, well-nourished.  HENT: Head: NCAT.  Right Ear: External ear normal.  Left Ear: External ear normal.  Eyes: . Pupils are  equal, round, and reactive to light. Conjunctivae and EOM are normal Neck: Normal range of motion. Neck supple.  Cardiovascular: Normal rate and regular rhythm.   Pulmonary/Chest: Effort normal and breath sounds normal.  Abd:  Soft, NT, ND, + BS Neurological: Pt is alert. Not confused , motor grossly intact Skin: Skin is warm. No rash Psychiatric: Pt behavior is normal. No agitation.     Assessment & Plan:

## 2013-06-21 NOTE — Assessment & Plan Note (Signed)
stable overall by history and exam, recent data reviewed with pt, and pt to continue medical treatment as before,  to f/u any worsening symptoms or concerns BP Readings from Last 3 Encounters:  06/19/13 118/80  03/12/13 150/82  12/19/12 130/68

## 2013-06-21 NOTE — Assessment & Plan Note (Signed)
stable overall by history and exam, recent data reviewed with pt, and pt to continue medical treatment as before,  to f/u any worsening symptoms or concerns Lab Results  Component Value Date   LDLCALC 91 06/17/2013

## 2013-06-21 NOTE — Assessment & Plan Note (Signed)
stable overall by history and exam, recent data reviewed with pt, and pt to continue medical treatment as before,  to f/u any worsening symptoms or concerns Lab Results  Component Value Date   HGBA1C 6.3 06/17/2013

## 2013-07-17 ENCOUNTER — Other Ambulatory Visit: Payer: Self-pay | Admitting: Orthopedic Surgery

## 2013-07-17 DIAGNOSIS — M545 Low back pain, unspecified: Secondary | ICD-10-CM

## 2013-07-26 ENCOUNTER — Ambulatory Visit
Admission: RE | Admit: 2013-07-26 | Discharge: 2013-07-26 | Disposition: A | Discharge status: Home / Self Care | Payer: Medicare Other | Source: Ambulatory Visit | Attending: Orthopedic Surgery | Admitting: Orthopedic Surgery

## 2013-07-26 DIAGNOSIS — M545 Low back pain, unspecified: Secondary | ICD-10-CM

## 2013-09-01 ENCOUNTER — Inpatient Hospital Stay (HOSPITAL_COMMUNITY)
Admission: EM | Admit: 2013-09-01 | Discharge: 2013-09-10 | DRG: 329 | Disposition: A | Discharge status: Home Health | Payer: Medicare Other | Attending: General Surgery | Admitting: General Surgery

## 2013-09-01 ENCOUNTER — Encounter (HOSPITAL_COMMUNITY): Payer: Self-pay | Admitting: Emergency Medicine

## 2013-09-01 ENCOUNTER — Emergency Department (HOSPITAL_COMMUNITY): Payer: Medicare Other

## 2013-09-01 ENCOUNTER — Telehealth: Payer: Self-pay | Admitting: Family Medicine

## 2013-09-01 DIAGNOSIS — I129 Hypertensive chronic kidney disease with stage 1 through stage 4 chronic kidney disease, or unspecified chronic kidney disease: Secondary | ICD-10-CM | POA: Diagnosis present

## 2013-09-01 DIAGNOSIS — K5720 Diverticulitis of large intestine with perforation and abscess without bleeding: Secondary | ICD-10-CM | POA: Diagnosis present

## 2013-09-01 DIAGNOSIS — I491 Atrial premature depolarization: Secondary | ICD-10-CM

## 2013-09-01 DIAGNOSIS — N736 Female pelvic peritoneal adhesions (postinfective): Secondary | ICD-10-CM | POA: Diagnosis present

## 2013-09-01 DIAGNOSIS — I4949 Other premature depolarization: Secondary | ICD-10-CM | POA: Diagnosis not present

## 2013-09-01 DIAGNOSIS — H409 Unspecified glaucoma: Secondary | ICD-10-CM | POA: Diagnosis present

## 2013-09-01 DIAGNOSIS — M545 Low back pain, unspecified: Secondary | ICD-10-CM | POA: Diagnosis present

## 2013-09-01 DIAGNOSIS — N179 Acute kidney failure, unspecified: Secondary | ICD-10-CM | POA: Diagnosis not present

## 2013-09-01 DIAGNOSIS — I10 Essential (primary) hypertension: Secondary | ICD-10-CM | POA: Diagnosis present

## 2013-09-01 DIAGNOSIS — K658 Other peritonitis: Secondary | ICD-10-CM | POA: Diagnosis present

## 2013-09-01 DIAGNOSIS — Z8249 Family history of ischemic heart disease and other diseases of the circulatory system: Secondary | ICD-10-CM

## 2013-09-01 DIAGNOSIS — I480 Paroxysmal atrial fibrillation: Secondary | ICD-10-CM

## 2013-09-01 DIAGNOSIS — E785 Hyperlipidemia, unspecified: Secondary | ICD-10-CM | POA: Diagnosis present

## 2013-09-01 DIAGNOSIS — M47816 Spondylosis without myelopathy or radiculopathy, lumbar region: Secondary | ICD-10-CM | POA: Diagnosis present

## 2013-09-01 DIAGNOSIS — K560 Paralytic ileus: Secondary | ICD-10-CM | POA: Diagnosis not present

## 2013-09-01 DIAGNOSIS — K5900 Constipation, unspecified: Secondary | ICD-10-CM | POA: Diagnosis present

## 2013-09-01 DIAGNOSIS — K630 Abscess of intestine: Secondary | ICD-10-CM | POA: Diagnosis present

## 2013-09-01 DIAGNOSIS — K219 Gastro-esophageal reflux disease without esophagitis: Secondary | ICD-10-CM | POA: Diagnosis present

## 2013-09-01 DIAGNOSIS — N189 Chronic kidney disease, unspecified: Secondary | ICD-10-CM | POA: Diagnosis present

## 2013-09-01 DIAGNOSIS — G8929 Other chronic pain: Secondary | ICD-10-CM | POA: Diagnosis present

## 2013-09-01 DIAGNOSIS — Z8619 Personal history of other infectious and parasitic diseases: Secondary | ICD-10-CM

## 2013-09-01 DIAGNOSIS — K5732 Diverticulitis of large intestine without perforation or abscess without bleeding: Principal | ICD-10-CM | POA: Diagnosis present

## 2013-09-01 DIAGNOSIS — E119 Type 2 diabetes mellitus without complications: Secondary | ICD-10-CM | POA: Diagnosis present

## 2013-09-01 DIAGNOSIS — Z79899 Other long term (current) drug therapy: Secondary | ICD-10-CM

## 2013-09-01 DIAGNOSIS — I493 Ventricular premature depolarization: Secondary | ICD-10-CM

## 2013-09-01 DIAGNOSIS — Z87891 Personal history of nicotine dependence: Secondary | ICD-10-CM

## 2013-09-01 DIAGNOSIS — R109 Unspecified abdominal pain: Secondary | ICD-10-CM | POA: Diagnosis not present

## 2013-09-01 LAB — CBC WITH DIFFERENTIAL/PLATELET
Basophils Absolute: 0 10*3/uL (ref 0.0–0.1)
Basophils Relative: 0 % (ref 0–1)
Eosinophils Absolute: 0 10*3/uL (ref 0.0–0.7)
Eosinophils Relative: 0 % (ref 0–5)
HEMATOCRIT: 34.7 % — AB (ref 36.0–46.0)
HEMOGLOBIN: 11.6 g/dL — AB (ref 12.0–15.0)
Lymphocytes Relative: 8 % — ABNORMAL LOW (ref 12–46)
Lymphs Abs: 1.4 10*3/uL (ref 0.7–4.0)
MCH: 32.1 pg (ref 26.0–34.0)
MCHC: 33.4 g/dL (ref 30.0–36.0)
MCV: 96.1 fL (ref 78.0–100.0)
MONO ABS: 1.8 10*3/uL — AB (ref 0.1–1.0)
MONOS PCT: 11 % (ref 3–12)
NEUTROS ABS: 13.1 10*3/uL — AB (ref 1.7–7.7)
NEUTROS PCT: 81 % — AB (ref 43–77)
Platelets: 308 10*3/uL (ref 150–400)
RBC: 3.61 MIL/uL — AB (ref 3.87–5.11)
RDW: 13.9 % (ref 11.5–15.5)
WBC: 16.3 10*3/uL — ABNORMAL HIGH (ref 4.0–10.5)

## 2013-09-01 LAB — URINALYSIS, ROUTINE W REFLEX MICROSCOPIC
Bilirubin Urine: NEGATIVE
Glucose, UA: NEGATIVE mg/dL
Ketones, ur: NEGATIVE mg/dL
Nitrite: NEGATIVE
SPECIFIC GRAVITY, URINE: 1.027 (ref 1.005–1.030)
Urobilinogen, UA: 1 mg/dL (ref 0.0–1.0)
pH: 6 (ref 5.0–8.0)

## 2013-09-01 LAB — COMPREHENSIVE METABOLIC PANEL
ALK PHOS: 77 U/L (ref 39–117)
ALT: 11 U/L (ref 0–35)
ANION GAP: 16 — AB (ref 5–15)
AST: 11 U/L (ref 0–37)
Albumin: 2.8 g/dL — ABNORMAL LOW (ref 3.5–5.2)
BUN: 24 mg/dL — AB (ref 6–23)
CHLORIDE: 103 meq/L (ref 96–112)
CO2: 26 meq/L (ref 19–32)
Calcium: 10.5 mg/dL (ref 8.4–10.5)
Creatinine, Ser: 1.28 mg/dL — ABNORMAL HIGH (ref 0.50–1.10)
GFR, EST AFRICAN AMERICAN: 45 mL/min — AB (ref >90)
GFR, EST NON AFRICAN AMERICAN: 39 mL/min — AB (ref >90)
GLUCOSE: 130 mg/dL — AB (ref 70–99)
POTASSIUM: 3.9 meq/L (ref 3.7–5.3)
Sodium: 145 mEq/L (ref 137–147)
Total Bilirubin: 0.6 mg/dL (ref 0.3–1.2)
Total Protein: 7.7 g/dL (ref 6.0–8.3)

## 2013-09-01 LAB — URINE MICROSCOPIC-ADD ON

## 2013-09-01 LAB — LIPASE, BLOOD: Lipase: 17 U/L (ref 11–59)

## 2013-09-01 MED ORDER — ONDANSETRON HCL 4 MG/2ML IJ SOLN
4.0000 mg | Freq: Once | INTRAMUSCULAR | Status: AC
  Administered 2013-09-01: 4 mg via INTRAVENOUS
  Filled 2013-09-01: qty 2

## 2013-09-01 MED ORDER — DEXTROSE 5 % IV SOLN
1.0000 g | Freq: Once | INTRAVENOUS | Status: AC
Start: 2013-09-01 — End: 2013-09-01
  Administered 2013-09-01: 1 g via INTRAVENOUS
  Filled 2013-09-01: qty 10

## 2013-09-01 MED ORDER — IOHEXOL 300 MG/ML  SOLN
50.0000 mL | Freq: Once | INTRAMUSCULAR | Status: AC | PRN
Start: 2013-09-01 — End: 2013-09-01
  Administered 2013-09-01: 50 mL via ORAL

## 2013-09-01 MED ORDER — MORPHINE SULFATE 4 MG/ML IJ SOLN
4.0000 mg | Freq: Once | INTRAMUSCULAR | Status: AC
  Administered 2013-09-01: 4 mg via INTRAVENOUS
  Filled 2013-09-01: qty 1

## 2013-09-01 MED ORDER — ACETAMINOPHEN 325 MG PO TABS
650.0000 mg | ORAL_TABLET | Freq: Once | ORAL | Status: AC
  Administered 2013-09-01: 650 mg via ORAL
  Filled 2013-09-01: qty 2

## 2013-09-01 MED ORDER — SODIUM CHLORIDE 0.9 % IV SOLN
INTRAVENOUS | Status: DC
  Administered 2013-09-01: 125 mL/h via INTRAVENOUS
  Administered 2013-09-02 – 2013-09-03 (×3): via INTRAVENOUS

## 2013-09-01 MED ORDER — PIPERACILLIN-TAZOBACTAM 3.375 G IVPB
3.3750 g | Freq: Three times a day (TID) | INTRAVENOUS | Status: AC
  Administered 2013-09-02 – 2013-09-09 (×21): 3.375 g via INTRAVENOUS
  Filled 2013-09-01 (×20): qty 50

## 2013-09-01 MED ORDER — IOHEXOL 300 MG/ML  SOLN
80.0000 mL | Freq: Once | INTRAMUSCULAR | Status: AC | PRN
Start: 2013-09-01 — End: 2013-09-01
  Administered 2013-09-01: 80 mL via INTRAVENOUS

## 2013-09-01 MED ORDER — SODIUM CHLORIDE 0.9 % IV BOLUS (SEPSIS)
500.0000 mL | Freq: Once | INTRAVENOUS | Status: AC
Start: 2013-09-01 — End: 2013-09-01
  Administered 2013-09-01: 500 mL via INTRAVENOUS

## 2013-09-01 NOTE — ED Notes (Signed)
Bed: WA03 Expected date:  Expected time:  Means of arrival:  Comments: Pt getting dressed

## 2013-09-01 NOTE — ED Notes (Signed)
Patient transported to CT 

## 2013-09-01 NOTE — H&P (Signed)
Kimberly Castillo is an 80 y.o. female.   Chief Complaint: abdominal pain HPI: patient is a 80 year old female who was in her usual state of health until the day before yesterday when she began to develop a gradual onset of pain across her lower abdomen and somewhat up to her right mid abdomen. This initially was not severe. She was constipated at that time. She happened to see her orthopedic surgeon at that time and a laxative as prescribed. She took it and had a firm bowel movement followed by some diarrhea yesterday and then during the day yesterday and through the night and today she has developed steadily worsening abdominal pain which is now very severe. She describes pain across her lower abdomen and radiating up into her upper abdomen particularly on the right side. She has had nausea without vomiting. Has noted fever, no chills. The pain is much worse with any motion. It has not been relieved with morphine in the emergency department.  She denies previous significant abdominal or GI complaints.  Past Medical History  Diagnosis Date  . Glaucoma   . Diabetes mellitus   . Hypertension   . Hyperlipidemia   . Arthritis     hands, back  . History of shingles   . Cataracts, bilateral   . A-fib   . Post herpetic neuralgia 12/19/2012  . Chronic lower back pain 12/19/2012  . Arthritis, lumbar spine 12/19/2012    Past Surgical History  Procedure Laterality Date  . Incision and drainage breast abscess  1958    left  . Abdominal hysterectomy  1964    partial    Family History  Problem Relation Age of Onset  . Colon cancer Neg Hx   . Stomach cancer Neg Hx   . Hypertension Mother   . Hypertension Maternal Grandmother    Social History:  reports that she has quit smoking. Her smoking use included Cigarettes. She started smoking about 21 years ago. She smoked 0.00 packs per day. She has never used smokeless tobacco. She reports that she does not drink alcohol or use illicit drugs.  Allergies:  No Known Allergies  Current Facility-Administered Medications  Medication Dose Route Frequency Provider Last Rate Last Dose  . 0.9 %  sodium chloride infusion   Intravenous Continuous Richarda Blade, MD 125 mL/hr at 09/01/13 2251 125 mL/hr at 09/01/13 2251   Current Outpatient Prescriptions  Medication Sig Dispense Refill  . azelastine (OPTIVAR) 0.05 % ophthalmic solution Place 1 drop into both eyes 2 (two) times daily.      [name] HYDROcodone-acetaminophen (NORCO) 10-325 MG per tablet Take 1 tablet by mouth 2 (two) times daily as needed for moderate pain.      . hydrOXYzine (ATARAX/VISTARIL) 25 MG tablet Take 25 mg by mouth 3 (three) times daily as needed for itching.      . latanoprost (XALATAN) 0.005 % ophthalmic solution Place 1 drop into both eyes at bedtime.      [name] levobunolol (BETAGAN) 0.5 % ophthalmic solution Place 1 drop into both eyes 2 (two) times daily.      [name] losartan-hydrochlorothiazide (HYZAAR) 100-25 MG per tablet Take 1 tablet by mouth daily.  90 tablet  3  . metFORMIN (GLUCOPHAGE) 500 MG tablet Take 1 tablet (500 mg total) by mouth 2 (two) times daily with a meal.  180 tablet  3  . nortriptyline (PAMELOR) 10 MG capsule 1-3 tabs by mouth at bedtime for Headache prevention  270 capsule  1  . pantoprazole (PROTONIX)  40 MG tablet Take 1 tablet (40 mg total) by mouth daily.  90 tablet  3  . potassium chloride SA (K-DUR,KLOR-CON) 20 MEQ tablet Take 1 tablet (20 mEq total) by mouth daily.  90 tablet  3  . simvastatin (ZOCOR) 40 MG tablet Take 1 tablet (40 mg total) by mouth at bedtime.  90 tablet  3  . valACYclovir (VALTREX) 500 MG tablet Take 1 tablet (500 mg total) by mouth 2 (two) times daily.  180 tablet  3  . zolpidem (AMBIEN) 5 MG tablet Take 5 mg by mouth at bedtime as needed. sleep         Results for orders placed during the hospital encounter of 09/01/13 (from the past 48 hour(s))  CBC WITH DIFFERENTIAL     Status: Abnormal   Collection Time    09/01/13  7:31 PM       Result Value Ref Range   WBC 16.3 (*) 4.0 - 10.5 K/uL   RBC 3.61 (*) 3.87 - 5.11 MIL/uL   Hemoglobin 11.6 (*) 12.0 - 15.0 g/dL   HCT 34.7 (*) 36.0 - 46.0 %   MCV 96.1  78.0 - 100.0 fL   MCH 32.1  26.0 - 34.0 pg   MCHC 33.4  30.0 - 36.0 g/dL   RDW 13.9  11.5 - 15.5 %   Platelets 308  150 - 400 K/uL   Neutrophils Relative % 81 (*) 43 - 77 %   Neutro Abs 13.1 (*) 1.7 - 7.7 K/uL   Lymphocytes Relative 8 (*) 12 - 46 %   Lymphs Abs 1.4  0.7 - 4.0 K/uL   Monocytes Relative 11  3 - 12 %   Monocytes Absolute 1.8 (*) 0.1 - 1.0 K/uL   Eosinophils Relative 0  0 - 5 %   Eosinophils Absolute 0.0  0.0 - 0.7 K/uL   Basophils Relative 0  0 - 1 %   Basophils Absolute 0.0  0.0 - 0.1 K/uL  COMPREHENSIVE METABOLIC PANEL     Status: Abnormal   Collection Time    09/01/13  7:31 PM      Result Value Ref Range   Sodium 145  137 - 147 mEq/L   Potassium 3.9  3.7 - 5.3 mEq/L   Chloride 103  96 - 112 mEq/L   CO2 26  19 - 32 mEq/L   Glucose, Bld 130 (*) 70 - 99 mg/dL   BUN 24 (*) 6 - 23 mg/dL   Creatinine, Ser 1.28 (*) 0.50 - 1.10 mg/dL   Calcium 10.5  8.4 - 10.5 mg/dL   Total Protein 7.7  6.0 - 8.3 g/dL   Albumin 2.8 (*) 3.5 - 5.2 g/dL   AST 11  0 - 37 U/L   ALT 11  0 - 35 U/L   Alkaline Phosphatase 77  39 - 117 U/L   Total Bilirubin 0.6  0.3 - 1.2 mg/dL   GFR calc non Af [name] 39 (*) >90 mL/min   GFR calc Af [name] 45 (*) >90 mL/min   Comment: (NOTE)     The eGFR has been calculated using the CKD EPI equation.     This calculation has not been validated in all clinical situations.     eGFR's persistently <90 mL/min signify possible Chronic Kidney     Disease.   Anion gap 16 (*) 5 - 15  LIPASE, BLOOD     Status: None   Collection Time    09/01/13  7:31 PM  Result Value Ref Range   Lipase 17  11 - 59 U/L  URINALYSIS, ROUTINE W REFLEX MICROSCOPIC     Status: Abnormal   Collection Time    09/01/13  8:30 PM      Result Value Ref Range   Color, Urine AMBER (*) YELLOW   Comment: BIOCHEMICALS  MAY BE AFFECTED BY COLOR   APPearance CLOUDY (*) CLEAR   Specific Gravity, Urine 1.027  1.005 - 1.030   pH 6.0  5.0 - 8.0   Glucose, UA NEGATIVE  NEGATIVE mg/dL   Hgb urine dipstick MODERATE (*) NEGATIVE   Bilirubin Urine NEGATIVE  NEGATIVE   Ketones, ur NEGATIVE  NEGATIVE mg/dL   Protein, ur >300 (*) NEGATIVE mg/dL   Urobilinogen, UA 1.0  0.0 - 1.0 mg/dL   Nitrite NEGATIVE  NEGATIVE   Leukocytes, UA SMALL (*) NEGATIVE  URINE MICROSCOPIC-ADD ON     Status: Abnormal   Collection Time    09/01/13  8:30 PM      Result Value Ref Range   Squamous Epithelial / LPF RARE  RARE   WBC, UA 11-20  <3 WBC/hpf   RBC / HPF 21-50  <3 RBC/hpf   Bacteria, UA RARE  RARE   Casts GRANULAR CAST (*) NEGATIVE   Ct Abdomen Pelvis W Contrast  09/01/2013   CLINICAL DATA:  pain  EXAM: CT ABDOMEN AND PELVIS WITH CONTRAST  TECHNIQUE: Multidetector CT imaging of the abdomen and pelvis was performed using the standard protocol following bolus administration of intravenous contrast.  CONTRAST:  28m OMNIPAQUE IOHEXOL 300 MG/ML SOLN, 566mOMNIPAQUE IOHEXOL 300 MG/ML SOLN  COMPARISON:  None.  FINDINGS: Linear subsegmental atelectasis or scarring at the left lung base. Patchy coronary calcifications.  There is a small amount of scattered free intraperitoneal gas.  Unremarkable liver, nondilated gallbladder, spleen, adrenal glands, kidneys, pancreas. Extensive aortoiliac atheromatous plaque. Stomach, small bowel, and colon are nondilated. Appendix not identified. Multiple descending and sigmoid diverticula.  There is a multiloculated extraluminal gas and fluid collection between the distal sigmoid colon and urinary bladder measuring approximately 6 cm in maximum transverse diameter, with surrounding inflammatory/ edematous change. There is a small amount of fluid in the cul-de-sac with peripheral enhancement. There is a 3 cm pocket of extraluminal fluid in the anterior right pelvis medial to the distal external iliac vessels,  showing some peripheral enhancement and probable loculation. No gas in the urinary bladder to suggest fistula.  No adenopathy. Facet degenerative changes in the lower lumbar spine, probably allowing the grade 1 anterolisthesis at L4-5. Previous hysterectomy.  IMPRESSION: 1. Sigmoid diverticulitis with multilocular small pelvic abscesses as well as scattered free intraperitoneal gas. Critical Value/emergent results were called by telephone at the time of interpretation on 09/01/2013 at 10:46 pm to Dr. ELDaleen Bo who verbally acknowledged these results. 1.   Electronically Signed   By: DaArne Cleveland.D.   On: 09/01/2013 22:48    Review of Systems  Constitutional: Positive for malaise/fatigue. Negative for fever and chills.  Respiratory: Negative.   Cardiovascular: Negative.   Gastrointestinal: Positive for nausea, abdominal pain and constipation. Negative for vomiting.  Genitourinary: Positive for urgency and frequency.  Musculoskeletal: Positive for back pain, joint pain and myalgias.  Neurological: Negative.     Blood pressure 174/79, pulse 107, temperature 100.9 F (38.3 C), temperature source Oral, resp. rate 20, SpO2 95.00%. Physical Exam  General: Alert, moderately obese African American female, in obvious pain Skin: Warm and dry without rash or infection.  HEENT: No palpable masses or thyromegaly. Sclera nonicteric. Pupils equal round and reactive. Oropharynx clear. Lymph nodes: No cervical, supraclavicular, or inguinal nodes palpable. Lungs: Breath sounds clear and equal without increased work of breathing Cardiovascular: Regular tachycardia without murmur. No JVD or edema. Peripheral pulses intact. Abdomen: mild distention. Absent bowel sounds. There is diffuse tenderness and guarding, worse in both lower quadrants with peritoneal signs. Extremities: No edema or joint swelling or deformity. No chronic venous stasis changes. Neurologic: Alert and fully oriented. Thought content  normal. No gross motor deficits.   Assessment/Plan Acute diverticulitis with perforation and abscess free abdominal air and evidence of peritonitis clinically. She has marked tenderness and pain and continued pain after morphine in the emergency department. I discussed options with the patient including nonoperative management with antibiotics and attempted percutaneous drainage of her pelvic abscess versus immediate laparotomy with [facility] colectomy and colostomy. With free air in the upper abdomen and the degree of pain and tenderness she is experiencing I think it is highly unlikely she will be able to avoid emergency surgery and could significantly worsen without surgery. We discussed that with emergency surgery she would have a colostomy could be reversed later. We discussed her wound would be left open and there would be some risks of anesthetic complications, bleeding or infection. After discussion the patient and her daughter we have elected to proceed with emergency laparotomy and [facility] colectomy which I believe would be the safest course.  Lucah Petta T 09/01/2013, 11:20 PM

## 2013-09-01 NOTE — ED Notes (Signed)
Pt states she has been having BLQ ABD pain. Pt states she went to see her doctor on Monday. Pt states she is also having constipation, dysuria, and vaginal discharge. MD gave her a prescription for the constipation, which did provide relief but other symptoms continued. Pt also running a fever.

## 2013-09-01 NOTE — Telephone Encounter (Signed)
Patient Information:  Caller Name: Kendrick Fries  Phone: [phone]  Patient: Kimberly Castillo  Gender: Female  DOB: 1933-12-01  Age: 80 Years  PCP: Cathlean Cower (Adults only)  Office Follow Up:  Does the office need to follow up with this patient?: No  Instructions For The Office: N/A  RN Note:  Pt states that she has abd pain that is currently at the navel area.  Pain is constant, worse with movement, sharp pain, severe.  Pt also has urinary burning, frequency, urgency.  Vaginal discharge brownish, no itching.  Pt was seen by "bone doctor" on 8/31 and discussed constipation.  Pt reports that she was started on Laxative (unknown pill)  and did have 1 BM today that was large then to loose stool.  She reports that having a BM did not improve the abd pain.  Rash, small red bumps, they blister, pop and then dry up.  Rash has been present 2 weeks.  Rash found only on legs.  Office contact per Profile and spoke with Lorre Nick, case discussed and agreed to send pt to ED for evaluation.  Symptoms  Reason For Call & Symptoms: Abdominal Pain, Urinary burning and vaginal discharge, Rash  Reviewed Health History In EMR: Yes  Reviewed Medications In EMR: Yes  Reviewed Allergies In EMR: Yes  Reviewed Surgeries / Procedures: Yes  Date of Onset of Symptoms: 08/30/2013  Treatments Tried: Laxative, mydol, Norco  Treatments Tried Worked: Yes  Guideline(s) Used:  Abdominal Pain - Female  Disposition Per Guideline:   Go to ED Now  Reason For Disposition Reached:   Severe abdominal pain (e.g., excruciating)  Advice Given:  N/A  Patient Will Follow Care Advice:  YES

## 2013-09-01 NOTE — ED Provider Notes (Signed)
CSN: [id number]     Arrival date & time 09/01/13  1846 History   First MD Initiated Contact with Patient 09/01/13 1943     Chief Complaint  Patient presents with  . Abdominal Pain  . Dysuria     (Consider location/radiation/quality/duration/timing/severity/associated sxs/prior Treatment) HPI Kimberly Castillo is a 80 y.o. female who presents for evaluation of abdominal pain, with swelling. The pain has been present for 2-3 days and is associated with constipation for 5 days. She took any stool softener given to her by her orthopedist, yesterday, with some result of stool, but no improvement of the discomfort. She has been having hard bowel movements. She denies fever, chills, weakness, or dizziness. She has been anorexic for 3 days. She has not vomited, but feels nauseated. She has not had this problem recently. She's taking her usual medications. There are no other known modifying factors.   Past Medical History  Diagnosis Date  . Glaucoma   . Diabetes mellitus   . Hypertension   . Hyperlipidemia   . Arthritis     hands, back  . History of shingles   . Cataracts, bilateral   . A-fib   . Post herpetic neuralgia 12/19/2012  . Chronic lower back pain 12/19/2012  . Arthritis, lumbar spine 12/19/2012   Past Surgical History  Procedure Laterality Date  . Incision and drainage breast abscess  1958    left  . Abdominal hysterectomy  1964    partial   Family History  Problem Relation Age of Onset  . Colon cancer Neg Hx   . Stomach cancer Neg Hx   . Hypertension Mother   . Hypertension Maternal Grandmother    History  Substance Use Topics  . Smoking status: Former Smoker    Types: Cigarettes    Start date: 09/17/1991  . Smokeless tobacco: Never Used  . Alcohol Use: No   OB History   Grav Para Term Preterm Abortions TAB SAB Ect Mult Living                 Review of Systems  All other systems reviewed and are negative.     Allergies  Review of patient's allergies  indicates no known allergies.  Home Medications   Prior to Admission medications   Medication Sig Start Date End Date Taking? Authorizing Provider  azelastine (OPTIVAR) 0.05 % ophthalmic solution Place 1 drop into both eyes 2 (two) times daily.   Yes Historical Provider, MD  HYDROcodone-acetaminophen (NORCO) 10-325 MG per tablet Take 1 tablet by mouth 2 (two) times daily as needed for moderate pain. 06/19/13  Yes Biagio Borg, MD  hydrOXYzine (ATARAX/VISTARIL) 25 MG tablet Take 25 mg by mouth 3 (three) times daily as needed for itching. 06/19/13  Yes Biagio Borg, MD  latanoprost (XALATAN) 0.005 % ophthalmic solution Place 1 drop into both eyes at bedtime.   Yes Historical Provider, MD  levobunolol (BETAGAN) 0.5 % ophthalmic solution Place 1 drop into both eyes 2 (two) times daily.   Yes Historical Provider, MD  losartan-hydrochlorothiazide (HYZAAR) 100-25 MG per tablet Take 1 tablet by mouth daily. 06/09/13  Yes Biagio Borg, MD  metFORMIN (GLUCOPHAGE) 500 MG tablet Take 1 tablet (500 mg total) by mouth 2 (two) times daily with a meal. 06/19/13  Yes Biagio Borg, MD  nortriptyline (PAMELOR) 10 MG capsule 1-3 tabs by mouth at bedtime for Headache prevention 06/19/13  Yes Biagio Borg, MD  pantoprazole (PROTONIX) 40 MG tablet Take 1 tablet (  40 mg total) by mouth daily. 01/20/13  Yes Biagio Borg, MD  potassium chloride SA (K-DUR,KLOR-CON) 20 MEQ tablet Take 1 tablet (20 mEq total) by mouth daily. 02/10/13  Yes Biagio Borg, MD  simvastatin (ZOCOR) 40 MG tablet Take 1 tablet (40 mg total) by mouth at bedtime. 06/09/13  Yes Biagio Borg, MD  valACYclovir (VALTREX) 500 MG tablet Take 1 tablet (500 mg total) by mouth 2 (two) times daily. 06/19/13  Yes Biagio Borg, MD  zolpidem (AMBIEN) 5 MG tablet Take 5 mg by mouth at bedtime as needed. sleep   Yes Historical Provider, MD   BP 174/79  Pulse 107  Temp(Src) 100.9 F (38.3 C) (Oral)  Resp 20  SpO2 95% Physical Exam  Nursing note and vitals  reviewed. Constitutional: She is oriented to person, place, and time. She appears well-developed.  Frail, elderly  HENT:  Head: Normocephalic and atraumatic.  Eyes: Conjunctivae and EOM are normal. Pupils are equal, round, and reactive to light.  Neck: Normal range of motion and phonation normal. Neck supple.  Cardiovascular: Normal rate, regular rhythm and intact distal pulses.   Pulmonary/Chest: Effort normal and breath sounds normal. She exhibits no tenderness.  Abdominal: Soft. She exhibits distension. She exhibits no mass. There is tenderness (Diffuse, moderate). There is no rebound and no guarding.  Decreased bowel sounds/  Musculoskeletal: Normal range of motion.  Neurological: She is alert and oriented to person, place, and time. She exhibits normal muscle tone.  Skin: Skin is warm and dry.  Psychiatric: She has a normal mood and affect. Her behavior is normal. Judgment and thought content normal.    ED Course  Procedures (including critical care time) Medications  0.9 %  sodium chloride infusion (125 mL/hr Intravenous New Bag/Given 09/01/13 2251)  cefTRIAXone (ROCEPHIN) 1 g in dextrose 5 % 50 mL IVPB (1 g Intravenous New Bag/Given 09/01/13 2251)  sodium chloride 0.9 % bolus 500 mL (500 mLs Intravenous New Bag/Given 09/01/13 2204)  morphine 4 MG/ML injection 4 mg (4 mg Intravenous Given 09/01/13 2204)  ondansetron (ZOFRAN) injection 4 mg (4 mg Intravenous Given 09/01/13 2204)  iohexol (OMNIPAQUE) 300 MG/ML solution 50 mL (50 mLs Oral Contrast Given 09/01/13 2211)  iohexol (OMNIPAQUE) 300 MG/ML solution 80 mL (80 mLs Intravenous Contrast Given 09/01/13 2211)  acetaminophen (TYLENOL) tablet 650 mg (650 mg Oral Given 09/01/13 2251)    Patient Vitals for the past 24 hrs:  BP Temp Temp src Pulse Resp SpO2  09/01/13 2149 174/79 mmHg 100.9 F (38.3 C) Oral 107 20 95 %  09/01/13 2148 - - - 99 20 95 %  09/01/13 1908 163/85 mmHg 101.6 F (38.7 C) Oral 57 18 96 %    10:53 PM Reevaluation with  update and discussion. After initial assessment and treatment, an updated evaluation reveals she remains uncomfortable. Kimberly Castillo L   22:48- I discussed the CT results with Dr. Vernard Gambles, radiologist; and he feels the results indicate diverticulitis with perforation, and several intra-abdominal abscesses  10:52 PM-Consult complete with Dr. Excell Seltzer. Patient case explained and discussed. He agrees to admit patient for further evaluation and treatment. Call ended at 22:59  Labs Review Labs Reviewed  CBC WITH DIFFERENTIAL - Abnormal; Notable for the following:    WBC 16.3 (*)    RBC 3.61 (*)    Hemoglobin 11.6 (*)    HCT 34.7 (*)    Neutrophils Relative % 81 (*)    Neutro Abs 13.1 (*)    Lymphocytes Relative 8 (*)  Monocytes Absolute 1.8 (*)    All other components within normal limits  COMPREHENSIVE METABOLIC PANEL - Abnormal; Notable for the following:    Glucose, Bld 130 (*)    BUN 24 (*)    Creatinine, Ser 1.28 (*)    Albumin 2.8 (*)    GFR calc non Af [name] 39 (*)    GFR calc Af [name] 45 (*)    Anion gap 16 (*)    All other components within normal limits  URINALYSIS, ROUTINE W REFLEX MICROSCOPIC - Abnormal; Notable for the following:    Color, Urine AMBER (*)    APPearance CLOUDY (*)    Hgb urine dipstick MODERATE (*)    Protein, ur >300 (*)    Leukocytes, UA SMALL (*)    All other components within normal limits  URINE MICROSCOPIC-ADD ON - Abnormal; Notable for the following:    Casts GRANULAR CAST (*)    All other components within normal limits  URINE CULTURE  LIPASE, BLOOD  POC OCCULT BLOOD, ED    Imaging Review Ct Abdomen Pelvis W Contrast  09/01/2013   CLINICAL DATA:  pain  EXAM: CT ABDOMEN AND PELVIS WITH CONTRAST  TECHNIQUE: Multidetector CT imaging of the abdomen and pelvis was performed using the standard protocol following bolus administration of intravenous contrast.  CONTRAST:  3mL OMNIPAQUE IOHEXOL 300 MG/ML SOLN, 91mL OMNIPAQUE IOHEXOL 300 MG/ML SOLN   COMPARISON:  None.  FINDINGS: Linear subsegmental atelectasis or scarring at the left lung base. Patchy coronary calcifications.  There is a small amount of scattered free intraperitoneal gas.  Unremarkable liver, nondilated gallbladder, spleen, adrenal glands, kidneys, pancreas. Extensive aortoiliac atheromatous plaque. Stomach, small bowel, and colon are nondilated. Appendix not identified. Multiple descending and sigmoid diverticula.  There is a multiloculated extraluminal gas and fluid collection between the distal sigmoid colon and urinary bladder measuring approximately 6 cm in maximum transverse diameter, with surrounding inflammatory/ edematous change. There is a small amount of fluid in the cul-de-sac with peripheral enhancement. There is a 3 cm pocket of extraluminal fluid in the anterior right pelvis medial to the distal external iliac vessels, showing some peripheral enhancement and probable loculation. No gas in the urinary bladder to suggest fistula.  No adenopathy. Facet degenerative changes in the lower lumbar spine, probably allowing the grade 1 anterolisthesis at L4-5. Previous hysterectomy.  IMPRESSION: 1. Sigmoid diverticulitis with multilocular small pelvic abscesses as well as scattered free intraperitoneal gas. Critical Value/emergent results were called by telephone at the time of interpretation on 09/01/2013 at 10:46 pm to Dr. Daleen Bo , who verbally acknowledged these results. 1.   Electronically Signed   By: Arne Cleveland M.D.   On: 09/01/2013 22:48     EKG Interpretation None      MDM   Final diagnoses:  Diverticulitis of colon with perforation    Diverticulitis with perforation. Hemodynamically stable. She'll need evaluation, consultation and likely surgical intervention.  Nursing Notes Reviewed/ Care Coordinated Applicable Imaging Reviewed Interpretation of Laboratory Data incorporated into ED treatment    Plan: Admit    Richarda Blade, MD 09/02/13  [phone]

## 2013-09-02 ENCOUNTER — Emergency Department (HOSPITAL_COMMUNITY): Payer: Medicare Other | Admitting: Certified Registered Nurse Anesthetist

## 2013-09-02 ENCOUNTER — Encounter (HOSPITAL_COMMUNITY): Payer: Medicare Other | Admitting: Certified Registered Nurse Anesthetist

## 2013-09-02 ENCOUNTER — Encounter (HOSPITAL_COMMUNITY): Admission: EM | Disposition: A | Discharge status: Home Health | Payer: Self-pay | Source: Home / Self Care

## 2013-09-02 ENCOUNTER — Encounter (HOSPITAL_COMMUNITY): Payer: Self-pay | Admitting: Certified Registered Nurse Anesthetist

## 2013-09-02 ENCOUNTER — Inpatient Hospital Stay: Admit: 2013-09-02 | Payer: Medicare Other | Admitting: General Surgery

## 2013-09-02 DIAGNOSIS — N736 Female pelvic peritoneal adhesions (postinfective): Secondary | ICD-10-CM | POA: Diagnosis present

## 2013-09-02 DIAGNOSIS — Z79899 Other long term (current) drug therapy: Secondary | ICD-10-CM | POA: Diagnosis not present

## 2013-09-02 DIAGNOSIS — K630 Abscess of intestine: Secondary | ICD-10-CM | POA: Diagnosis present

## 2013-09-02 DIAGNOSIS — I491 Atrial premature depolarization: Secondary | ICD-10-CM | POA: Diagnosis not present

## 2013-09-02 DIAGNOSIS — K658 Other peritonitis: Secondary | ICD-10-CM | POA: Diagnosis present

## 2013-09-02 DIAGNOSIS — N179 Acute kidney failure, unspecified: Secondary | ICD-10-CM | POA: Diagnosis not present

## 2013-09-02 DIAGNOSIS — Z8249 Family history of ischemic heart disease and other diseases of the circulatory system: Secondary | ICD-10-CM | POA: Diagnosis not present

## 2013-09-02 DIAGNOSIS — E119 Type 2 diabetes mellitus without complications: Secondary | ICD-10-CM | POA: Diagnosis present

## 2013-09-02 DIAGNOSIS — E785 Hyperlipidemia, unspecified: Secondary | ICD-10-CM | POA: Diagnosis present

## 2013-09-02 DIAGNOSIS — K5720 Diverticulitis of large intestine with perforation and abscess without bleeding: Secondary | ICD-10-CM | POA: Diagnosis present

## 2013-09-02 DIAGNOSIS — K560 Paralytic ileus: Secondary | ICD-10-CM | POA: Diagnosis not present

## 2013-09-02 DIAGNOSIS — Z87891 Personal history of nicotine dependence: Secondary | ICD-10-CM | POA: Diagnosis not present

## 2013-09-02 DIAGNOSIS — K219 Gastro-esophageal reflux disease without esophagitis: Secondary | ICD-10-CM | POA: Diagnosis present

## 2013-09-02 DIAGNOSIS — K5732 Diverticulitis of large intestine without perforation or abscess without bleeding: Secondary | ICD-10-CM | POA: Diagnosis present

## 2013-09-02 DIAGNOSIS — K5900 Constipation, unspecified: Secondary | ICD-10-CM | POA: Diagnosis present

## 2013-09-02 DIAGNOSIS — I4949 Other premature depolarization: Secondary | ICD-10-CM | POA: Diagnosis not present

## 2013-09-02 DIAGNOSIS — H409 Unspecified glaucoma: Secondary | ICD-10-CM | POA: Diagnosis present

## 2013-09-02 DIAGNOSIS — N189 Chronic kidney disease, unspecified: Secondary | ICD-10-CM | POA: Diagnosis present

## 2013-09-02 DIAGNOSIS — R109 Unspecified abdominal pain: Secondary | ICD-10-CM | POA: Diagnosis present

## 2013-09-02 DIAGNOSIS — I129 Hypertensive chronic kidney disease with stage 1 through stage 4 chronic kidney disease, or unspecified chronic kidney disease: Secondary | ICD-10-CM | POA: Diagnosis present

## 2013-09-02 DIAGNOSIS — Z8619 Personal history of other infectious and parasitic diseases: Secondary | ICD-10-CM | POA: Diagnosis not present

## 2013-09-02 HISTORY — PX: COLON RESECTION: SHX5231

## 2013-09-02 LAB — BASIC METABOLIC PANEL
ANION GAP: 14 (ref 5–15)
BUN: 25 mg/dL — ABNORMAL HIGH (ref 6–23)
CALCIUM: 8.9 mg/dL (ref 8.4–10.5)
CO2: 22 mEq/L (ref 19–32)
Chloride: 105 mEq/L (ref 96–112)
Creatinine, Ser: 1.33 mg/dL — ABNORMAL HIGH (ref 0.50–1.10)
GFR calc Af Amer: 43 mL/min — ABNORMAL LOW (ref >90)
GFR calc non Af Amer: 37 mL/min — ABNORMAL LOW (ref >90)
GLUCOSE: 198 mg/dL — AB (ref 70–99)
Potassium: 3.8 mEq/L (ref 3.7–5.3)
Sodium: 141 mEq/L (ref 137–147)

## 2013-09-02 LAB — URINE CULTURE: Special Requests: NORMAL

## 2013-09-02 LAB — CBC
HCT: 33.1 % — ABNORMAL LOW (ref 36.0–46.0)
Hemoglobin: 10.9 g/dL — ABNORMAL LOW (ref 12.0–15.0)
MCH: 31.7 pg (ref 26.0–34.0)
MCHC: 32.9 g/dL (ref 30.0–36.0)
MCV: 96.2 fL (ref 78.0–100.0)
PLATELETS: 299 10*3/uL (ref 150–400)
RBC: 3.44 MIL/uL — ABNORMAL LOW (ref 3.87–5.11)
RDW: 14.2 % (ref 11.5–15.5)
WBC: 18.6 10*3/uL — ABNORMAL HIGH (ref 4.0–10.5)

## 2013-09-02 LAB — MRSA PCR SCREENING: MRSA BY PCR: NEGATIVE

## 2013-09-02 LAB — GLUCOSE, CAPILLARY
Glucose-Capillary: 171 mg/dL — ABNORMAL HIGH (ref 70–99)
Glucose-Capillary: 176 mg/dL — ABNORMAL HIGH (ref 70–99)
Glucose-Capillary: 177 mg/dL — ABNORMAL HIGH (ref 70–99)
Glucose-Capillary: 111 mg/dL — ABNORMAL HIGH (ref 70–99)
Glucose-Capillary: 118 mg/dL — ABNORMAL HIGH (ref 70–99)
Glucose-Capillary: 89 mg/dL (ref 70–99)

## 2013-09-02 LAB — CBG MONITORING, ED: Glucose-Capillary: 132 mg/dL — ABNORMAL HIGH (ref 70–99)

## 2013-09-02 SURGERY — COLON RESECTION
Anesthesia: General | Site: Abdomen

## 2013-09-02 MED ORDER — PROPOFOL 10 MG/ML IV BOLUS
INTRAVENOUS | Status: DC | PRN
  Administered 2013-09-02: 80 mg via INTRAVENOUS

## 2013-09-02 MED ORDER — ONDANSETRON HCL 4 MG/2ML IJ SOLN
4.0000 mg | Freq: Four times a day (QID) | INTRAMUSCULAR | Status: DC | PRN
  Administered 2013-09-08: 4 mg via INTRAVENOUS
  Filled 2013-09-02: qty 2

## 2013-09-02 MED ORDER — LIDOCAINE HCL (CARDIAC) 20 MG/ML IV SOLN
INTRAVENOUS | Status: AC
  Filled 2013-09-02: qty 5

## 2013-09-02 MED ORDER — LEVOBUNOLOL HCL 0.5 % OP SOLN
1.0000 [drp] | Freq: Two times a day (BID) | OPHTHALMIC | Status: DC
  Administered 2013-09-02 – 2013-09-10 (×16): 1 [drp] via OPHTHALMIC
  Filled 2013-09-02 (×2): qty 5

## 2013-09-02 MED ORDER — HYDROMORPHONE HCL PF 1 MG/ML IJ SOLN
0.2500 mg | INTRAMUSCULAR | Status: DC | PRN
  Administered 2013-09-02 (×2): 0.5 mg via INTRAVENOUS

## 2013-09-02 MED ORDER — METOPROLOL TARTRATE 1 MG/ML IV SOLN
INTRAVENOUS | Status: DC | PRN
  Administered 2013-09-02 (×2): 1 mg via INTRAVENOUS
  Administered 2013-09-02: 2 mg via INTRAVENOUS
  Administered 2013-09-02: 1 mg via INTRAVENOUS

## 2013-09-02 MED ORDER — DEXAMETHASONE SODIUM PHOSPHATE 10 MG/ML IJ SOLN
INTRAMUSCULAR | Status: DC | PRN
  Administered 2013-09-02: 5 mg via INTRAVENOUS

## 2013-09-02 MED ORDER — HYDROMORPHONE HCL PF 1 MG/ML IJ SOLN
INTRAMUSCULAR | Status: DC | PRN
  Administered 2013-09-02 (×4): 0.5 mg via INTRAVENOUS

## 2013-09-02 MED ORDER — HYDROMORPHONE HCL PF 2 MG/ML IJ SOLN
INTRAMUSCULAR | Status: AC
  Filled 2013-09-02: qty 1

## 2013-09-02 MED ORDER — HEPARIN SODIUM (PORCINE) 5000 UNIT/ML IJ SOLN
5000.0000 [IU] | Freq: Three times a day (TID) | INTRAMUSCULAR | Status: DC
  Administered 2013-09-03 – 2013-09-10 (×21): 5000 [IU] via SUBCUTANEOUS
  Filled 2013-09-02 (×25): qty 1

## 2013-09-02 MED ORDER — LACTATED RINGERS IV SOLN
INTRAVENOUS | Status: DC | PRN
  Administered 2013-09-02 (×3): via INTRAVENOUS

## 2013-09-02 MED ORDER — HYDROMORPHONE HCL PF 1 MG/ML IJ SOLN
INTRAMUSCULAR | Status: AC
  Filled 2013-09-02: qty 1

## 2013-09-02 MED ORDER — NEOSTIGMINE METHYLSULFATE 10 MG/10ML IV SOLN
INTRAVENOUS | Status: DC | PRN
  Administered 2013-09-02: 4 mg via INTRAVENOUS

## 2013-09-02 MED ORDER — ONDANSETRON HCL 4 MG PO TABS: 4.0000 mg | ORAL_TABLET | Freq: Four times a day (QID) | ORAL | Status: DC | PRN

## 2013-09-02 MED ORDER — CISATRACURIUM BESYLATE (PF) 10 MG/5ML IV SOLN
INTRAVENOUS | Status: DC | PRN
  Administered 2013-09-02: 2 mg via INTRAVENOUS
  Administered 2013-09-02 (×2): 4 mg via INTRAVENOUS
  Administered 2013-09-02: 2 mg via INTRAVENOUS

## 2013-09-02 MED ORDER — PROMETHAZINE HCL 25 MG/ML IJ SOLN: 6.2500 mg | INTRAMUSCULAR | Status: DC | PRN

## 2013-09-02 MED ORDER — CISATRACURIUM BESYLATE 20 MG/10ML IV SOLN
INTRAVENOUS | Status: AC
  Filled 2013-09-02: qty 10

## 2013-09-02 MED ORDER — GLYCOPYRROLATE 0.2 MG/ML IJ SOLN
INTRAMUSCULAR | Status: DC | PRN
  Administered 2013-09-02: .6 mg via INTRAVENOUS

## 2013-09-02 MED ORDER — INSULIN ASPART 100 UNIT/ML ~~LOC~~ SOLN
0.0000 [IU] | SUBCUTANEOUS | Status: DC
Start: 2013-09-02 — End: 2013-09-10
  Administered 2013-09-02 (×2): 3 [IU] via SUBCUTANEOUS
  Administered 2013-09-03 (×2): 2 [IU] via SUBCUTANEOUS
  Administered 2013-09-03: 3 [IU] via SUBCUTANEOUS
  Administered 2013-09-04 – 2013-09-05 (×5): 2 [IU] via SUBCUTANEOUS
  Administered 2013-09-05: 3 [IU] via SUBCUTANEOUS
  Administered 2013-09-05 – 2013-09-06 (×7): 2 [IU] via SUBCUTANEOUS
  Administered 2013-09-07 (×3): 3 [IU] via SUBCUTANEOUS
  Administered 2013-09-07 – 2013-09-09 (×8): 2 [IU] via SUBCUTANEOUS
  Administered 2013-09-10: 3 [IU] via SUBCUTANEOUS

## 2013-09-02 MED ORDER — METOPROLOL TARTRATE 1 MG/ML IV SOLN
5.0000 mg | INTRAVENOUS | Status: DC | PRN
  Administered 2013-09-02 – 2013-09-09 (×8): 5 mg via INTRAVENOUS
  Filled 2013-09-02 (×8): qty 5

## 2013-09-02 MED ORDER — GLYCOPYRROLATE 0.2 MG/ML IJ SOLN
INTRAMUSCULAR | Status: AC
  Filled 2013-09-02: qty 3

## 2013-09-02 MED ORDER — KETOTIFEN FUMARATE 0.025 % OP SOLN
1.0000 [drp] | Freq: Two times a day (BID) | OPHTHALMIC | Status: DC
  Administered 2013-09-02 – 2013-09-10 (×16): 1 [drp] via OPHTHALMIC
  Filled 2013-09-02 (×2): qty 5

## 2013-09-02 MED ORDER — SUCCINYLCHOLINE CHLORIDE 20 MG/ML IJ SOLN
INTRAMUSCULAR | Status: DC | PRN
  Administered 2013-09-02: 100 mg via INTRAVENOUS

## 2013-09-02 MED ORDER — LATANOPROST 0.005 % OP SOLN
1.0000 [drp] | Freq: Every day | OPHTHALMIC | Status: DC
  Administered 2013-09-02 – 2013-09-09 (×8): 1 [drp] via OPHTHALMIC
  Filled 2013-09-02 (×2): qty 2.5

## 2013-09-02 MED ORDER — PANTOPRAZOLE SODIUM 40 MG IV SOLR
40.0000 mg | INTRAVENOUS | Status: DC
  Administered 2013-09-02 – 2013-09-09 (×8): 40 mg via INTRAVENOUS
  Filled 2013-09-02 (×9): qty 40

## 2013-09-02 MED ORDER — ONDANSETRON HCL 4 MG/2ML IJ SOLN
INTRAMUSCULAR | Status: DC | PRN
  Administered 2013-09-02: 4 mg via INTRAVENOUS

## 2013-09-02 MED ORDER — DEXAMETHASONE SODIUM PHOSPHATE 10 MG/ML IJ SOLN
INTRAMUSCULAR | Status: AC
  Filled 2013-09-02: qty 1

## 2013-09-02 MED ORDER — PIPERACILLIN-TAZOBACTAM 3.375 G IVPB
INTRAVENOUS | Status: AC
  Filled 2013-09-02: qty 50

## 2013-09-02 MED ORDER — FENTANYL CITRATE 0.05 MG/ML IJ SOLN
INTRAMUSCULAR | Status: DC | PRN
  Administered 2013-09-02 (×5): 50 ug via INTRAVENOUS

## 2013-09-02 MED ORDER — 0.9 % SODIUM CHLORIDE (POUR BTL) OPTIME
TOPICAL | Status: DC | PRN
  Administered 2013-09-02: 4000 mL

## 2013-09-02 MED ORDER — FENTANYL CITRATE 0.05 MG/ML IJ SOLN
INTRAMUSCULAR | Status: AC
  Filled 2013-09-02: qty 5

## 2013-09-02 MED ORDER — ONDANSETRON HCL 4 MG/2ML IJ SOLN
INTRAMUSCULAR | Status: AC
  Filled 2013-09-02: qty 2

## 2013-09-02 MED ORDER — MORPHINE SULFATE 2 MG/ML IJ SOLN
2.0000 mg | INTRAMUSCULAR | Status: DC | PRN
  Administered 2013-09-02 (×4): 2 mg via INTRAVENOUS
  Administered 2013-09-02: 4 mg via INTRAVENOUS
  Administered 2013-09-02 (×2): 2 mg via INTRAVENOUS
  Administered 2013-09-02: 4 mg via INTRAVENOUS
  Administered 2013-09-02 – 2013-09-03 (×10): 2 mg via INTRAVENOUS
  Administered 2013-09-04: 4 mg via INTRAVENOUS
  Administered 2013-09-04: 2 mg via INTRAVENOUS
  Administered 2013-09-04 (×3): 3 mg via INTRAVENOUS
  Administered 2013-09-04: 2 mg via INTRAVENOUS
  Administered 2013-09-04 – 2013-09-05 (×3): 4 mg via INTRAVENOUS
  Administered 2013-09-05 (×2): 6 mg via INTRAVENOUS
  Administered 2013-09-05: 4 mg via INTRAVENOUS
  Administered 2013-09-05: 2 mg via INTRAVENOUS
  Administered 2013-09-05 – 2013-09-07 (×15): 4 mg via INTRAVENOUS
  Administered 2013-09-08: 6 mg via INTRAVENOUS
  Filled 2013-09-02: qty 1
  Filled 2013-09-02: qty 3
  Filled 2013-09-02 (×9): qty 2
  Filled 2013-09-02: qty 1
  Filled 2013-09-02: qty 2
  Filled 2013-09-02 (×2): qty 1
  Filled 2013-09-02: qty 2
  Filled 2013-09-02: qty 1
  Filled 2013-09-02 (×3): qty 2
  Filled 2013-09-02: qty 1
  Filled 2013-09-02: qty 2
  Filled 2013-09-02 (×3): qty 1
  Filled 2013-09-02: qty 2
  Filled 2013-09-02: qty 1
  Filled 2013-09-02: qty 2
  Filled 2013-09-02: qty 3
  Filled 2013-09-02 (×3): qty 1
  Filled 2013-09-02: qty 2
  Filled 2013-09-02: qty 1
  Filled 2013-09-02: qty 2
  Filled 2013-09-02: qty 1
  Filled 2013-09-02 (×5): qty 2
  Filled 2013-09-02: qty 1
  Filled 2013-09-02: qty 2
  Filled 2013-09-02 (×2): qty 1
  Filled 2013-09-02: qty 3
  Filled 2013-09-02: qty 2

## 2013-09-02 MED ORDER — LIDOCAINE HCL (CARDIAC) 20 MG/ML IV SOLN
INTRAVENOUS | Status: DC | PRN
  Administered 2013-09-02: 80 mg via INTRAVENOUS

## 2013-09-02 MED ORDER — PROPOFOL 10 MG/ML IV BOLUS
INTRAVENOUS | Status: AC
  Filled 2013-09-02: qty 20

## 2013-09-02 SURGICAL SUPPLY — 61 items
APPLICATOR COTTON TIP 6IN STRL (MISCELLANEOUS) IMPLANT
BLADE EXTENDED COATED 6.5IN (ELECTRODE) ×3 IMPLANT
BLADE HEX COATED 2.75 (ELECTRODE) ×3 IMPLANT
BLADE SURG SZ10 CARB STEEL (BLADE) ×3 IMPLANT
BNDG GAUZE ELAST 4 BULKY (GAUZE/BANDAGES/DRESSINGS) ×3 IMPLANT
CANISTER SUCTION 2500CC (MISCELLANEOUS) IMPLANT
CLIP TI LARGE 6 (CLIP) ×3 IMPLANT
COVER MAYO STAND STRL (DRAPES) ×3 IMPLANT
DRAIN CHANNEL 19F RND (DRAIN) ×3 IMPLANT
DRAPE LAPAROSCOPIC ABDOMINAL (DRAPES) ×3 IMPLANT
DRAPE LG THREE QUARTER DISP (DRAPES) ×3 IMPLANT
DRAPE WARM FLUID 44X44 (DRAPE) ×6 IMPLANT
DRSG PAD ABDOMINAL 8X10 ST (GAUZE/BANDAGES/DRESSINGS) IMPLANT
ELECT REM PT RETURN 9FT ADLT (ELECTROSURGICAL) ×3
ELECTRODE REM PT RTRN 9FT ADLT (ELECTROSURGICAL) ×1 IMPLANT
ENSEAL DEVICE STD TIP 35CM (ENDOMECHANICALS) IMPLANT
EVACUATOR DRAINAGE 10X20 100CC (DRAIN) ×1 IMPLANT
EVACUATOR SILICONE 100CC (DRAIN) ×2
GAUZE SPONGE 4X4 12PLY STRL (GAUZE/BANDAGES/DRESSINGS) ×3 IMPLANT
GLOVE BIOGEL PI IND STRL 7.0 (GLOVE) ×1 IMPLANT
GLOVE BIOGEL PI IND STRL 7.5 (GLOVE) ×2 IMPLANT
GLOVE BIOGEL PI INDICATOR 7.0 (GLOVE) ×2
GLOVE BIOGEL PI INDICATOR 7.5 (GLOVE) ×4
GLOVE SS BIOGEL STRL SZ 7.5 (GLOVE) ×2 IMPLANT
GLOVE SUPERSENSE BIOGEL SZ 7.5 (GLOVE) ×4
GOWN STRL REUS W/TWL LRG LVL3 (GOWN DISPOSABLE) ×3 IMPLANT
GOWN STRL REUS W/TWL XL LVL3 (GOWN DISPOSABLE) ×6 IMPLANT
KIT BASIN OR (CUSTOM PROCEDURE TRAY) ×3 IMPLANT
LEGGING LITHOTOMY PAIR STRL (DRAPES) IMPLANT
LIGASURE IMPACT 36 18CM CVD LR (INSTRUMENTS) ×6 IMPLANT
NS IRRIG 1000ML POUR BTL (IV SOLUTION) IMPLANT
PACK GENERAL/GYN (CUSTOM PROCEDURE TRAY) ×3 IMPLANT
PAD ABD 8X10 STRL (GAUZE/BANDAGES/DRESSINGS) ×3 IMPLANT
SEALER TISSUE G2 CVD JAW 35 (ENDOMECHANICALS) IMPLANT
SEALER TISSUE G2 CVD JAW 45CM (ENDOMECHANICALS)
SHEARS HARMONIC ACE PLUS 36CM (ENDOMECHANICALS) IMPLANT
SPONGE LAP 18X18 X RAY DECT (DISPOSABLE) ×3 IMPLANT
STAPLER GUN LINEAR PROX 60 (STAPLE) ×3 IMPLANT
STAPLER PROXIMATE 75MM BLUE (STAPLE) ×3 IMPLANT
STAPLER VISISTAT 35W (STAPLE) ×3 IMPLANT
SUCTION POOLE TIP (SUCTIONS) ×3 IMPLANT
SUT ETHILON 3 0 PS 1 (SUTURE) ×3 IMPLANT
SUT NOV 1 T60/GS (SUTURE) IMPLANT
SUT NOVA 1 T20/GS 25DT (SUTURE) ×3 IMPLANT
SUT NOVA NAB DX-16 0-1 5-0 T12 (SUTURE) IMPLANT
SUT NOVA T20/GS 25 (SUTURE) IMPLANT
SUT PDS AB 1 TP1 96 (SUTURE) ×6 IMPLANT
SUT SILK 2 0 (SUTURE) ×2
SUT SILK 2 0 SH CR/8 (SUTURE) ×3 IMPLANT
SUT SILK 2 0SH CR/8 30 (SUTURE) IMPLANT
SUT SILK 2-0 18XBRD TIE 12 (SUTURE) ×1 IMPLANT
SUT SILK 2-0 30XBRD TIE 12 (SUTURE) IMPLANT
SUT SILK 3 0 (SUTURE) ×4
SUT SILK 3 0 SH CR/8 (SUTURE) ×3 IMPLANT
SUT SILK 3-0 18XBRD TIE 12 (SUTURE) ×2 IMPLANT
SUT VIC AB 3-0 SH 18 (SUTURE) ×3 IMPLANT
TAPE CLOTH SURG 6X10 WHT LF (GAUZE/BANDAGES/DRESSINGS) ×3 IMPLANT
TOWEL OR 17X26 10 PK STRL BLUE (TOWEL DISPOSABLE) ×6 IMPLANT
TRAY FOLEY CATH 14FRSI W/METER (CATHETERS) ×3 IMPLANT
WATER STERILE IRR 1500ML POUR (IV SOLUTION) IMPLANT
YANKAUER SUCT BULB TIP NO VENT (SUCTIONS) ×3 IMPLANT

## 2013-09-02 NOTE — Consult Note (Signed)
WOC ostomy consult note [facility] Nursing team is aware of newly created stoma.  Did not see today as patient is fresh post op; it is too early to remove pouching system.  We will see tomorrow am and begin following with education at that time. Thank you for this consultation. Thanks, Maudie Flakes, MSN, RN, [place], [place], [place] [phone])

## 2013-09-02 NOTE — Progress Notes (Signed)
CARE MANAGEMENT NOTE 09/02/2013  Patient:  Kimberly Castillo,Kimberly Castillo   Account Number:  [id number]  Date Initiated:  09/02/2013  Documentation initiated by:  Artina Minella  Subjective/Objective Assessment:   Patient to ICU from OR this AM.  Underwent Hartmann's resection for perforated diverticular disease with peritonitis.  Awake, confused.  Significant hypertension. Complains of pain.     Action/Plan:   home when stable   Anticipated DC Date:  09/05/2013   Anticipated DC Plan:  [place] referral  NA      DC Planning Services  CM consult      [facility] Choice  NA   Choice offered to / List presented to:  NA   DME arranged  NA      DME agency  NA     [place] arranged  NA      [place] agency  NA   Status of service:  In process, will continue to follow Medicare Important Message given?  NA - LOS <3 / Initial given by admissions (If response is "NO", the following Medicare IM given date fields will be blank) Date Medicare IM given:   Medicare IM given by:   Date Additional Medicare IM given:   Additional Medicare IM given by:    Discharge Disposition:    Per UR Regulation:  Reviewed for med. necessity/level of care/duration of stay  If discussed at [place] of Stay Meetings, dates discussed:    Comments:  Velva Harman, RN, BSN, CCM:

## 2013-09-02 NOTE — Progress Notes (Signed)
OT Cancellation Note  Patient Details Name: Guyneth Agius MRN: [id number] DOB: 1933/08/14   Cancelled Treatment:    Reason Eval/Treat Not Completed: Other (comment)  Spoke to RN.  Pt came to ICU from PACU at 5am.  Will check back tomorrow.    Gwyn Mehring 09/02/2013, 1:26 PM Lesle Chris, OTR/L [phone] 09/02/2013

## 2013-09-02 NOTE — Progress Notes (Signed)
Patient ID: Kimberly Castillo, female   DOB: 04/05/1933, 80 y.o.   MRN: [id number]  General Surgery - [facility] - Progress Note  POD# 0  Subjective: Patient to ICU from OR this AM.  Underwent Hartmann's resection for perforated diverticular disease with peritonitis.  Awake, confused.  Significant hypertension.  Complains of pain.  Objective: Vital signs in last 24 hours: Temp:  [99 F (37.2 C)-101.6 F (38.7 C)] 99 F (37.2 C) (09/02 0500) Pulse Rate:  [37-107] 37 (09/02 0600) Resp:  [10-24] 10 (09/02 0600) BP: (145-194)/(61-104) 180/73 mmHg (09/02 0600) SpO2:  [94 %-97 %] 97 % (09/02 0600) Weight:  [160 lb (72.576 kg)-163 lb 2.3 oz (74 kg)] 163 lb 2.3 oz (74 kg) (09/02 0600)    Intake/Output from previous day: 09/01 0701 - 09/02 0700 In: 3393.8 [I.V.:3343.8; IV Piggyback:50] Out: 53 [Urine:350; Drains:60; Blood:400]  Exam: HEENT - clear, not icteric Neck - soft Chest - clear bilaterally Cor - mild tachycardia, no murmur Abd - dressing dry and intact; stoma viable left mid abdomen; drain with serosanguinous output Ext - no significant edema Neuro - grossly intact, no focal deficits  Lab Results:   Recent Labs  09/01/13 1931 09/02/13 0518  WBC 16.3* 18.6*  HGB 11.6* 10.9*  HCT 34.7* 33.1*  PLT 308 299     Recent Labs  09/01/13 1931 09/02/13 0518  NA 145 141  K 3.9 3.8  CL 103 105  CO2 26 22  GLUCOSE 130* 198*  BUN 24* 25*  CREATININE 1.28* 1.33*  CALCIUM 10.5 8.9    Studies/Results: Ct Abdomen Pelvis W Contrast  09/01/2013   CLINICAL DATA:  pain  EXAM: CT ABDOMEN AND PELVIS WITH CONTRAST  TECHNIQUE: Multidetector CT imaging of the abdomen and pelvis was performed using the standard protocol following bolus administration of intravenous contrast.  CONTRAST:  39mL OMNIPAQUE IOHEXOL 300 MG/ML SOLN, 19mL OMNIPAQUE IOHEXOL 300 MG/ML SOLN  COMPARISON:  None.  FINDINGS: Linear subsegmental atelectasis or scarring at the left lung base. Patchy  coronary calcifications.  There is a small amount of scattered free intraperitoneal gas.  Unremarkable liver, nondilated gallbladder, spleen, adrenal glands, kidneys, pancreas. Extensive aortoiliac atheromatous plaque. Stomach, small bowel, and colon are nondilated. Appendix not identified. Multiple descending and sigmoid diverticula.  There is a multiloculated extraluminal gas and fluid collection between the distal sigmoid colon and urinary bladder measuring approximately 6 cm in maximum transverse diameter, with surrounding inflammatory/ edematous change. There is a small amount of fluid in the cul-de-sac with peripheral enhancement. There is a 3 cm pocket of extraluminal fluid in the anterior right pelvis medial to the distal external iliac vessels, showing some peripheral enhancement and probable loculation. No gas in the urinary bladder to suggest fistula.  No adenopathy. Facet degenerative changes in the lower lumbar spine, probably allowing the grade 1 anterolisthesis at L4-5. Previous hysterectomy.  IMPRESSION: 1. Sigmoid diverticulitis with multilocular small pelvic abscesses as well as scattered free intraperitoneal gas. Critical Value/emergent results were called by telephone at the time of interpretation on 09/01/2013 at 10:46 pm to Dr. Daleen Bo , who verbally acknowledged these results. 1.   Electronically Signed   By: Arne Cleveland M.D.   On: 09/01/2013 22:48   Dg Chest Port 1 View  09/02/2013   CLINICAL DATA:  preop  EXAM: PORTABLE CHEST - 1 VIEW  COMPARISON:  04/21/2009  FINDINGS: Heart size upper limits normal for technique. Low lung volumes. New bibasilar poorly marginated interstitial opacities. Atheromatous aorta. No  definite effusion. Visualized skeletal structures are unremarkable.  IMPRESSION: 1. Low volumes. Bibasilar subsegmental atelectasis versus early interstitial edema/ infiltrates.   Electronically Signed   By: Arne Cleveland M.D.   On: 09/02/2013 00:41    Assessment /  Plan: 1.  Status post Hartmann's resection for perforated diverticular disease  NPO, NG decompression  IV hydration  IV Zosyn 2.  Hypertension  IV lopressor prn  Monitor in ICU 3.  Diabetes mellitus  Insulin per sliding scale  Monitor in ICU  Earnstine Regal, MD, [facility] Office: [phone]  09/02/2013

## 2013-09-02 NOTE — Anesthesia Preprocedure Evaluation (Addendum)
Anesthesia Evaluation  Patient identified by MRN, date of birth, ID band Patient awake    Reviewed: Allergy & Precautions, H&P , NPO status , Patient's Chart, lab work & pertinent test results  Airway Mallampati: II TM Distance: >3 FB Neck ROM: Full    Dental  (+) Upper Dentures   Pulmonary neg pulmonary ROS, former smoker,  breath sounds clear to auscultation  Pulmonary exam normal       Cardiovascular hypertension, Pt. on medications + dysrhythmias Atrial Fibrillation Rhythm:Irregular Rate:Tachycardia     Neuro/Psych negative neurological ROS  negative psych ROS   GI/Hepatic negative GI ROS, Neg liver ROS,   Endo/Other  diabetes, Type 2  Renal/GU negative Renal ROS  negative genitourinary   Musculoskeletal negative musculoskeletal ROS (+)   Abdominal   Peds negative pediatric ROS (+)  Hematology negative hematology ROS (+)   Anesthesia Other Findings   Reproductive/Obstetrics negative OB ROS                          Anesthesia Physical Anesthesia Plan  ASA: III and emergent  Anesthesia Plan: General   Post-op Pain Management:    Induction: Intravenous, Rapid sequence and Cricoid pressure planned  Airway Management Planned: Oral ETT  Additional Equipment:   Intra-op Plan:   Post-operative Plan: Extubation in OR  Informed Consent: I have reviewed the patients History and Physical, chart, labs and discussed the procedure including the risks, benefits and alternatives for the proposed anesthesia with the patient or authorized representative who has indicated his/her understanding and acceptance.   Dental advisory given  Plan Discussed with: CRNA and Surgeon  Anesthesia Plan Comments:         Anesthesia Quick Evaluation

## 2013-09-02 NOTE — Transfer of Care (Signed)
Immediate Anesthesia Transfer of Care Note  Patient: Kimberly Castillo  Procedure(s) Performed: Procedure(s) (LRB): Hartman colectomy (N/A)  Patient Location: PACU  Anesthesia Type: General  Level of Consciousness: sedated, patient cooperative and responds to stimulation  Airway & Oxygen Therapy: Patient Spontanous Breathing and Patient connected to face mask oxgen  Post-op Assessment: Report given to PACU RN and Post -op Vital signs reviewed and stable  Post vital signs: Reviewed and stable  Complications: No apparent anesthesia complications

## 2013-09-02 NOTE — Op Note (Signed)
Preoperative Diagnosis: Diverticulitis of colon with perforation [562.11]  Postoprative Diagnosis: Diverticulitis of colon with perforation [562.11]  Procedure: Procedure(s): Hartman colectomy   Surgeon: Excell Seltzer T   Assistants: None  Anesthesia:  General endotracheal anesthesia  Indications: patient is a 80 year old female who presents with 2 days of worsening severe diffuse abdominal pain. CT scan shows evidence of perforated diverticulitis with severe diverticulosis of the sigmoid colon, a multiloculated abscess between the sigmoid colon and the bladder containing air and scattered free air in the upper abdomen. She is evidence of diffuse peritonitis on exam. I discussed options with the patient including nonoperative and operative management and we have elected to proceed with emergency Hartmann colectomy. We discussed the indication for the procedure and its nature and risks of anesthetic complications, bleeding, infection and need for colostomy and open wound. She agreed to proceed.  Procedure Detail: patient was brought to the operating room, placed in supine position on the operating table, and general endotracheal anesthesia induced. She received broad-spectrum preoperative IV antibiotics. PAS were in place. A Foley catheter was placed. Nasogastric tube was placed. The abdomen was widely sterilely prepped and draped. Patient time it was performed and correct procedure verified. I used a midline incision in the lower abdomen extending just up above the umbilicus. Dissection was carried down through the subcutaneous tissue and midline fascia using cautery and the peritoneum entered under direct vision. There was purulent fluid within the abdomen. There was diffuse peritonitis throughout the lower abdomen with exudate over multiple loops of small bowel but no obvious feculent contamination. There was extensive diverticulosis involving the entire sigmoid colon. Using mostly blunt  dissection the sigmoid was separated from the bladder and a more discrete abscess broken up and drained. This was cultured. The pelvis was irrigated and suctioned and this was cleared. There was an apparent perforation in the anterior wall of the colon that had been separated from the bladder. There were fairly severe chronic adhesions of the sigmoid to the pelvic sidewall and bladder as well as acute adhesions and edema. The sigmoid colon was mobilized dividing the thin peritoneal attachments laterally. The area of marked inflammation was also adherent to what appeared to be at least remnants of both ovaries which were dissected away with cautery and blunt dissection. A soft more normal portion of proximal sigmoid colon was cleared of mesentery and pericolic fat and divided with the GIA 75 mm stapler. The mesentery of the sigmoid was then sequentially divided with the LigaSure staying close to the bowel and away from the pelvic sidewall. I did not attempt to identify ureters due to the severe edema and inflammatory change throughout the pelvis. The dissection continued along the midline in the mesentery close to the bowel wall until I was distal to the perforation. There were still fairly extensive adhesions down into the pelvis and cul-de-sac. I broke up some of these but some of these also appeared to be chronic and I did not completely take all this down as they were quite dense. There appeared to be some significant diverticulosis distal the perforation but the bowel was reasonably soft and I cleared the bowel of mesentery and pericolic fat several centimeters distal to the perforation and then this was stapled and divided with a single firing of the TA 60 stapler and the specimen removed. Following this the abdomen was thoroughly irrigated with multiple liters of warm saline irrigating the loops of small bowel and upper abdomen as well as the pelvis. Hemostasis was  assured. I further mobilized the left colon  dividing lateral peritoneal attachments up to the splenic flexure and mobilized the mesentery up out of the retroperitoneum. A short bit of the distal mesentery was divided to aid in mobility prior to bringing in and colostomy out without undue tension. A colostomy site was chosen in the left mid abdomen skin button the subcutaneous tissue excised. A cruciate incision was made in the anterior fascia and the muscle bluntly split and peritoneum incised and dilated to 2 fingers. The colostomy was brought out as an end colostomy without undue tension and was clearly well perfused. I did leave a 58 Blake closed suction drain in the cul-de-sac and pelvis in the area of the previous discrete abscess. The viscera returned to the anatomic position. The midline fascia was closed with running loops #1 PDS begun from either end of the incision with intermittent interrupted #1 Novafils. The colostomy was everted and matured with interrupted 3-0 Vicryl's. The midline wound was left open and packed with moist saline gauze. Sponge needle and instrument counts were correct.    Findings: Perforated sigmoid diverticulitis with diffuse exudative peritonitis  Estimated Blood Loss:  400 cc         Drains: 19 round Blake and pelvis  Blood Given: none          Specimens: sigmoid colon        Complications:  * No complications entered in OR log *         Disposition: PACU - hemodynamically stable.         Condition: stable

## 2013-09-02 NOTE — Progress Notes (Signed)
PT Cancellation Note  Patient Details Name: Kimberly Castillo MRN: [id number] DOB: 12-26-1933   Cancelled Treatment:    Reason Eval/Treat Not Completed: Patient not medically ready--will attempt PT eval on tomorrow. Thanks   Weston Anna, MPT Pager: [phone]

## 2013-09-03 ENCOUNTER — Encounter (HOSPITAL_COMMUNITY): Payer: Self-pay | Admitting: General Surgery

## 2013-09-03 DIAGNOSIS — I4891 Unspecified atrial fibrillation: Secondary | ICD-10-CM

## 2013-09-03 LAB — GLUCOSE, CAPILLARY
GLUCOSE-CAPILLARY: 119 mg/dL — AB (ref 70–99)
GLUCOSE-CAPILLARY: 110 mg/dL — AB (ref 70–99)
Glucose-Capillary: 102 mg/dL — ABNORMAL HIGH (ref 70–99)
Glucose-Capillary: 118 mg/dL — ABNORMAL HIGH (ref 70–99)
Glucose-Capillary: 126 mg/dL — ABNORMAL HIGH (ref 70–99)
Glucose-Capillary: 157 mg/dL — ABNORMAL HIGH (ref 70–99)

## 2013-09-03 LAB — CBC
HEMATOCRIT: 30.2 % — AB (ref 36.0–46.0)
Hemoglobin: 9.9 g/dL — ABNORMAL LOW (ref 12.0–15.0)
MCH: 31.7 pg (ref 26.0–34.0)
MCHC: 32.8 g/dL (ref 30.0–36.0)
MCV: 96.8 fL (ref 78.0–100.0)
Platelets: 278 10*3/uL (ref 150–400)
RBC: 3.12 MIL/uL — ABNORMAL LOW (ref 3.87–5.11)
RDW: 14.4 % (ref 11.5–15.5)
WBC: 18.6 10*3/uL — ABNORMAL HIGH (ref 4.0–10.5)

## 2013-09-03 LAB — BASIC METABOLIC PANEL
Anion gap: 17 — ABNORMAL HIGH (ref 5–15)
BUN: 31 mg/dL — AB (ref 6–23)
CO2: 22 mEq/L (ref 19–32)
Calcium: 9.3 mg/dL (ref 8.4–10.5)
Chloride: 106 mEq/L (ref 96–112)
Creatinine, Ser: 1.41 mg/dL — ABNORMAL HIGH (ref 0.50–1.10)
GFR, EST AFRICAN AMERICAN: 40 mL/min — AB (ref >90)
GFR, EST NON AFRICAN AMERICAN: 35 mL/min — AB (ref >90)
Glucose, Bld: 127 mg/dL — ABNORMAL HIGH (ref 70–99)
Potassium: 4 mEq/L (ref 3.7–5.3)
Sodium: 145 mEq/L (ref 137–147)

## 2013-09-03 MED ORDER — KCL IN DEXTROSE-NACL 30-5-0.45 MEQ/L-%-% IV SOLN
INTRAVENOUS | Status: DC
  Administered 2013-09-03 – 2013-09-04 (×2): via INTRAVENOUS
  Administered 2013-09-05 – 2013-09-06 (×2): 75 mL/h via INTRAVENOUS
  Administered 2013-09-07 – 2013-09-09 (×3): via INTRAVENOUS
  Administered 2013-09-10: 50 mL/h via INTRAVENOUS
  Filled 2013-09-03 (×13): qty 1000

## 2013-09-03 MED ORDER — METOPROLOL TARTRATE 1 MG/ML IV SOLN
2.5000 mg | Freq: Four times a day (QID) | INTRAVENOUS | Status: DC
  Administered 2013-09-03 – 2013-09-10 (×24): 2.5 mg via INTRAVENOUS
  Filled 2013-09-03 (×31): qty 5

## 2013-09-03 MED ORDER — FUROSEMIDE 10 MG/ML IJ SOLN
20.0000 mg | Freq: Once | INTRAMUSCULAR | Status: AC
  Administered 2013-09-03: 20 mg via INTRAVENOUS
  Filled 2013-09-03: qty 2

## 2013-09-03 NOTE — Evaluation (Signed)
Physical Therapy Evaluation Patient Details Name: Cartha Porras MRN: [id number] DOB: 1933/01/07 Today's Date: 09/03/2013   History of Present Illness  80 yo female s/p Hartmann's resection 09/01/13. Hx of glaucoma, DM, HTN, A fib, postherpetic neuralgia, chronic LBP, shingles.   Clinical Impression  On eval, pt required Min assist +2 for safety-able to ambulate ~115 feet with walker. Tolerated fairly well. Anticipate pt will progress well during stay. Recommend HHPT.     Follow Up Recommendations Home health PT;Supervision/Assistance - 24 hour    Equipment Recommendations  None recommended by PT    Recommendations for Other Services OT consult     Precautions / Restrictions Precautions Precautions: Fall Precaution Comments: abd surgery; drain R flank Restrictions Weight Bearing Restrictions: No      Mobility  Bed Mobility Overal bed mobility: Needs Assistance Bed Mobility: Sit to Supine       Sit to supine: Min assist   General bed mobility comments: assist for LEs. Increased time.   Transfers Overall transfer level: Needs assistance Equipment used: Rolling walker (2 wheeled) Transfers: Sit to/from Stand Sit to Stand: Mod assist         General transfer comment: assist to rise, stabilize, control descent. VCS safety, hand placement  Ambulation/Gait Ambulation/Gait assistance: Min assist;+2 safety/equipment;+2 physical assistance Ambulation Distance (Feet): 115 Feet Assistive device: Rolling walker (2 wheeled) Gait Pattern/deviations: Step-through pattern;Trunk flexed;Decreased stride length     General Gait Details: assist to stabilize. slow gait speed. tolerated fairly well.   Stairs            Wheelchair Mobility    Modified Rankin (Stroke Patients Only)       Balance                                             Pertinent Vitals/Pain Pain Assessment: 0-10 Pain Score: 5  Pain Location: R flank at site of drain Pain  Descriptors / Indicators: Sharp;Shooting Pain Intervention(s): Limited activity within patient's tolerance;Monitored during session;Repositioned    Home Living Family/patient expects to be discharged to:: Private residence Living Arrangements: Children;Other relatives   Type of Home: House Home Access: Stairs to enter   Entrance Stairs-Number of Steps: 3 steps  Home Layout: One level Home Equipment: [place] - 2 wheels;Cane - single point      Prior Function Level of Independence: Independent with assistive device(s)         Comments: used walker when needed     Hand Dominance        Extremity/Trunk Assessment   Upper Extremity Assessment: Defer to OT evaluation           Lower Extremity Assessment: Generalized weakness      Cervical / Trunk Assessment: Normal  Communication   Communication: No difficulties  Cognition Arousal/Alertness: Awake/alert Behavior During Therapy: WFL for tasks assessed/performed Overall Cognitive Status: Within Functional Limits for tasks assessed                      General Comments      Exercises        Assessment/Plan    PT Assessment Patient needs continued PT services  PT Diagnosis Difficulty walking;Generalized weakness;Acute pain   PT Problem List Decreased strength;Decreased activity tolerance;Decreased balance;Decreased mobility;Decreased knowledge of use of DME;Pain  PT Treatment Interventions DME instruction;Gait training;Functional mobility training;Therapeutic activities;Patient/family education;Therapeutic exercise;Balance training  PT Goals (Current goals can be found in the Care Plan section) Acute Rehab PT Goals Patient Stated Goal: home. less pain PT Goal Formulation: With patient Time For Goal Achievement: 09/17/13 Potential to Achieve Goals: Good    Frequency Min 3X/week   Barriers to discharge        Co-evaluation               End of Session Equipment Utilized During  Treatment: Oxygen Activity Tolerance: Patient limited by pain Patient left: in bed;with call bell/phone within reach           Time: 1056-1128 PT Time Calculation (min): 32 min   Charges:   PT Evaluation $Initial PT Evaluation Tier I: 1 Procedure PT Treatments $Gait Training: 8-22 mins $Therapeutic Activity: 8-22 mins   PT G Codes:          Weston Anna, MPT Pager: [phone]

## 2013-09-03 NOTE — Consult Note (Signed)
CARDIOLOGY CONSULT NOTE   Patient ID: Kimberly Castillo MRN: [id number], DOB/AGE: 02-10-1933   Admit date: 09/01/2013 Date of Consult: 09/03/2013   Primary Physician: Cathlean Cower, MD Primary Cardiologist: New  Pt. Profile   80 year old [place] American female with past medical history significant for hypertension, diabetes, hyperlipidemia, chronic kidney disease and history of shingles presented with perforated diverticulitis and underwent hartman colectomy on 09/02/2013. Postop noted to have ectopy concerning of a-fib  Problem List  Past Medical History  Diagnosis Date  . Glaucoma   . Diabetes mellitus   . Hypertension   . Hyperlipidemia   . Arthritis     hands, back  . History of shingles   . Cataracts, bilateral   . A-fib   . Post herpetic neuralgia 12/19/2012  . Chronic lower back pain 12/19/2012  . Arthritis, lumbar spine 12/19/2012    Past Surgical History  Procedure Laterality Date  . Incision and drainage breast abscess  1958    left  . Abdominal hysterectomy  1964    partial  . Colon resection N/A 09/02/2013    Procedure: [place] colectomy;  Surgeon: Excell Seltzer, MD;  Location: [facility];  Service: General;  Laterality: N/A;     Allergies  No Known Allergies  HPI   The patient is a 80 year old African American female with past medical history significant for hypertension, diabetes, hyperlipidemia, chronic kidney disease and history of shingles. She denies any previous cardiac history. Her last stress test was over 30 years ago. She has not seen any cardiologist the recent years. She states she was quite active in her younger days, however has been dealing with some dyspnea on exertion at baseline for the past several years. She has not noted any worsening of her dyspnea on exertion recently. She denies any chest pain or palpitation. She states she has some intermittent lower extremity edema that seems to be self resolving. Patient denies any fever or chill, however she  had 2 episodes of night sweats recently for unknown reasons.  Patient presented [facility] on the night of 09/01/2013 with worsening lower abdominal pain. She had some constipation recently and was prescribed by her orthopedic surgeon with laxative. Although the laxative seems to help with the constipation, her abdominal pain worsened. On initial presentation, her blood pressure was 174/79. Pulse was elevated at 107. Temperature was elevated at 100.9. O2 saturation 95%. CT of her abdomen showed sigmoid diverticulitis with multi-lobular small pelvic abscess as well as scattered free intraperitoneal gas concerning for perforation. Patient was admitted by surgery team and underwent emergent laparotomy and Hartman colectomy on the night of her admission. Postoperatively, patient has been recovering well. She still has not had any significant PO intake however patient appears to want to eat. Patient has been tolerating sips of clear liquid well without significant nausea or vomiting. Primary team noted the patient had intermittent episodes of ectopy on telemetry which appeared to be atrial fibrillation. EKG was obtained in the morning of 09/02/2013 which showed normal sinus rhythm with heart rate 100, PACs and PVCs. Cardiology has been consulted for atrial fibrillation.    Inpatient Medications  . heparin subcutaneous  5,000 Units Subcutaneous 3 times per day  . insulin aspart  0-15 Units Subcutaneous 6 times per day  . ketotifen  1 drop Both Eyes BID  . latanoprost  1 drop Both Eyes QHS  . levobunolol  1 drop Both Eyes BID  . pantoprazole (PROTONIX) IV  40 mg Intravenous Q24H  .  piperacillin-tazobactam (ZOSYN)  IV  3.375 g Intravenous Q8H    Family History Family History  Problem Relation Age of Onset  . Colon cancer Neg Hx   . Stomach cancer Neg Hx   . Hypertension Mother   . Hypertension Maternal Grandmother      Social History History   Social History  . Marital Status: Single    Spouse  Name: N/A    Number of Children: N/A  . Years of Education: N/A   Occupational History  . Not on file.   Social History Main Topics  . Smoking status: Former Smoker    Types: Cigarettes    Start date: 09/17/1991  . Smokeless tobacco: Never Used  . Alcohol Use: No  . Drug Use: No  . Sexual Activity: Yes   Other Topics Concern  . Not on file   Social History Narrative  . No narrative on file     Review of Systems  General:  No chills, fever or weight changes. +night sweats   Cardiovascular:  No chest pain, edema, orthopnea, palpitations, paroxysmal nocturnal dyspnea. +chronic dyspnea on exertion Dermatological: No rash, lesions/masses Respiratory: No cough, dyspnea Urologic: No hematuria, dysuria Abdominal:   No nausea, vomiting, bright red blood per rectum, melena, or hematemesis. + diarrhea Neurologic:  No visual changes, wkns, changes in mental status. All other systems reviewed and are otherwise negative except as noted above.  Physical Exam  Blood pressure 150/61, pulse 90, temperature 98.4 F (36.9 C), temperature source Oral, resp. rate 32, height 5\' 7"  (1.702 m), weight 163 lb 2.3 oz (74 kg), SpO2 100.00%.  General: Pleasant, NAD Psych: Normal affect. Neuro: Alert and oriented X 3. Moves all extremities spontaneously. HEENT: Normal  Neck: Supple without bruits or JVD. Lungs:  Resp regular and unlabored, intermittent rale in bilateral bases, otherwise CTA Heart: RRR no s3, s4, or murmurs. Abdomen: Soft, non-tender, non-distended. Dressing in placed over LLQ, drain noted out of suprapubic space/RLQ. Did not appreciated significant bowel sound. Extremities: No clubbing, cyanosis or edema. DP/PT/Radials 2+ and equal bilaterally.  Labs  No results found for this basename: CKTOTAL, CKMB, TROPONINI,  in the last 72 hours Lab Results  Component Value Date   WBC 18.6* 09/03/2013   HGB 9.9* 09/03/2013   HCT 30.2* 09/03/2013   MCV 96.8 09/03/2013   PLT 278 09/03/2013      Recent Labs Lab 09/01/13 1931  09/03/13 0355  NA 145  < > 145  K 3.9  < > 4.0  CL 103  < > 106  CO2 26  < > 22  BUN 24*  < > 31*  CREATININE 1.28*  < > 1.41*  CALCIUM 10.5  < > 9.3  PROT 7.7  --   --   BILITOT 0.6  --   --   ALKPHOS 77  --   --   ALT 11  --   --   AST 11  --   --   GLUCOSE 130*  < > 127*  < > = values in this interval not displayed. Lab Results  Component Value Date   CHOL 177 06/17/2013   HDL 51.00 06/17/2013   LDLCALC 91 06/17/2013   TRIG 177.0* 06/17/2013   No results found for this basename: DDIMER    Radiology/Studies  Ct Abdomen Pelvis W Contrast  09/01/2013   CLINICAL DATA:  pain  EXAM: CT ABDOMEN AND PELVIS WITH CONTRAST  TECHNIQUE: Multidetector CT imaging of the abdomen and pelvis was performed  using the standard protocol following bolus administration of intravenous contrast.  CONTRAST:  98mL OMNIPAQUE IOHEXOL 300 MG/ML SOLN, 37mL OMNIPAQUE IOHEXOL 300 MG/ML SOLN  COMPARISON:  None.  FINDINGS: Linear subsegmental atelectasis or scarring at the left lung base. Patchy coronary calcifications.  There is a small amount of scattered free intraperitoneal gas.  Unremarkable liver, nondilated gallbladder, spleen, adrenal glands, kidneys, pancreas. Extensive aortoiliac atheromatous plaque. Stomach, small bowel, and colon are nondilated. Appendix not identified. Multiple descending and sigmoid diverticula.  There is a multiloculated extraluminal gas and fluid collection between the distal sigmoid colon and urinary bladder measuring approximately 6 cm in maximum transverse diameter, with surrounding inflammatory/ edematous change. There is a small amount of fluid in the cul-de-sac with peripheral enhancement. There is a 3 cm pocket of extraluminal fluid in the anterior right pelvis medial to the distal external iliac vessels, showing some peripheral enhancement and probable loculation. No gas in the urinary bladder to suggest fistula.  No adenopathy. Facet degenerative  changes in the lower lumbar spine, probably allowing the grade 1 anterolisthesis at L4-5. Previous hysterectomy.  IMPRESSION: 1. Sigmoid diverticulitis with multilocular small pelvic abscesses as well as scattered free intraperitoneal gas. Critical Value/emergent results were called by telephone at the time of interpretation on 09/01/2013 at 10:46 pm to Dr. Daleen Bo , who verbally acknowledged these results. 1.   Electronically Signed   By: Arne Cleveland M.D.   On: 09/01/2013 22:48   Dg Chest Port 1 View  09/02/2013   CLINICAL DATA:  preop  EXAM: PORTABLE CHEST - 1 VIEW  COMPARISON:  04/21/2009  FINDINGS: Heart size upper limits normal for technique. Low lung volumes. New bibasilar poorly marginated interstitial opacities. Atheromatous aorta. No definite effusion. Visualized skeletal structures are unremarkable.  IMPRESSION: 1. Low volumes. Bibasilar subsegmental atelectasis versus early interstitial edema/ infiltrates.   Electronically Signed   By: Arne Cleveland M.D.   On: 09/02/2013 00:41    ECG  Normal sinus rhythm with heart rate 100, frequent PACs and PVCs  ASSESSMENT AND PLAN  1. ?Intermittent a-fib vs frequent PACs  - appeared to be rate controlled  - discussed with Dr. Stanford Breed who reviewed the patient telemetry. Telemetry seems have mistaken the irregular tele 2/2 PACs with a-fib. No convincing evidence of a-fib at this time. No treatment neccessary other than BP control  - can transfer out of step down when ok by primary team, call with any further questions  2. Hypertension 3. Diabetes 4. Hyperlipidemia 5. chronic kidney disease 6. history of shingles  7. Post-op anemia 8. Leukocytosis 2/2 perforated diverticulitis 9. Perforated diverticulitis: main problem during this admission  - s/p emergent laparotomy and Hartman colectomy in 9/2 am  Signed, Almyra Deforest, Hershal Coria 09/03/2013, 2:19 PM As above, patient seen and examined. Briefly she is an 80 year old female admitted with  diverticulitis requiring emergency laparotomy/cholectomy. There is a question of atrial fibrillation and cardiology asked to evaluate. Patient denies chest pain, dyspnea, palpitations or syncope. Electrocardiograms and telemetry reviewed. She is having sinus rhythm with PACs but no atrial fibrillation is noted. I will add low-dose metoprolol IV while she is n.p.o. She certainly is at risk for atrial fibrillation but this is not documented. We will follow telemetry. Kirk Ruths

## 2013-09-03 NOTE — Consult Note (Signed)
WOC ostomy consult note LLQ  Colostomy created emergently Stoma type/location: LLQ  Stomal assessment/size: Stoma is located in a crease, lateral to rectus muscle, os at center.  1 and 1/4 inches round today with mild edema Peristomal assessment: Intact, clear Treatment options for stomal/peristomal skin: Skin barrier ring Output None Ostomy pouching: 2pc. pouching system with skin barrier ring applied today with skin barrier ring.  I will change to a 1-piece pouching system with skin barrier ring with next pouch change to accommodate crease Education provided: Patient and daughter taught that there are nurse specialists who assist the MD with post operative care and teaching regarding the stoma.  Taught that this will begin when patient is transferred to the floor tomorrow.  I will plan to see tomorrow and begin ostomy teaching. Thanks, Maudie Flakes, MSN, RN, [place], [place], [place] [phone])

## 2013-09-03 NOTE — Progress Notes (Signed)
OT Cancellation Note  Patient Details Name: Kimberly Castillo MRN: [id number] DOB: 1933-11-03   Cancelled Treatment:    Reason Eval/Treat Not Completed: Fatigue/lethargy limiting ability to participate Pt has been up for several hours and walked with PT and is not sleeping. Spoke with RN who states pt needs rest. Will check later in day or next day Thanks,  Betsy Pries 09/03/2013, 12:13 PM

## 2013-09-03 NOTE — Progress Notes (Signed)
Surgeon made aware of elevated BP.  Orders placed. MD states to continue to monitor in hopes of starting PO antihypertensive medication tomorrow.

## 2013-09-03 NOTE — Progress Notes (Signed)
Patient ID: Kimberly Castillo, female   DOB: Nov 23, 1933, 80 y.o.   MRN: [id number]  General Surgery - [facility] - Progress Note  POD# 1  Subjective: Patient up to chair.  Wants to eat.  Objective: Vital signs in last 24 hours: Temp:  [98.2 F (36.8 C)-99.8 F (37.7 C)] 98.4 F (36.9 C) (09/03 0800) Pulse Rate:  [39-97] 90 (09/03 0345) Resp:  [10-32] 20 (09/03 0846) BP: (151-205)/(63-127) 170/63 mmHg (09/03 0600) SpO2:  [90 %-100 %] 96 % (09/03 0846) Weight:  [163 lb 2.3 oz (74 kg)] 163 lb 2.3 oz (74 kg) (09/03 0400)    Intake/Output from previous day: 09/02 0701 - 09/03 0700 In: 3530 [I.V.:3000; IV Piggyback:50] Out: 2965 [Urine:1700; Emesis/NG output:1150; Drains:90; Stool:25]  Exam: HEENT - clear, not icteric Neck - soft Chest - clear bilaterally Cor - irregular, rate controlled, no murmur Abd - soft, mild distension; BS present; dressing dry and intact; stoma viable, no output; drain with serosanguinous Ext - no significant edema Neuro - grossly intact, no focal deficits  Lab Results:   Recent Labs  09/02/13 0518 09/03/13 0355  WBC 18.6* 18.6*  HGB 10.9* 9.9*  HCT 33.1* 30.2*  PLT 299 278     Recent Labs  09/02/13 0518 09/03/13 0355  NA 141 145  K 3.8 4.0  CL 105 106  CO2 22 22  GLUCOSE 198* 127*  BUN 25* 31*  CREATININE 1.33* 1.41*  CALCIUM 8.9 9.3    Studies/Results: Ct Abdomen Pelvis W Contrast  09/01/2013   CLINICAL DATA:  pain  EXAM: CT ABDOMEN AND PELVIS WITH CONTRAST  TECHNIQUE: Multidetector CT imaging of the abdomen and pelvis was performed using the standard protocol following bolus administration of intravenous contrast.  CONTRAST:  54mL OMNIPAQUE IOHEXOL 300 MG/ML SOLN, 63mL OMNIPAQUE IOHEXOL 300 MG/ML SOLN  COMPARISON:  None.  FINDINGS: Linear subsegmental atelectasis or scarring at the left lung base. Patchy coronary calcifications.  There is a small amount of scattered free intraperitoneal gas.  Unremarkable liver,  nondilated gallbladder, spleen, adrenal glands, kidneys, pancreas. Extensive aortoiliac atheromatous plaque. Stomach, small bowel, and colon are nondilated. Appendix not identified. Multiple descending and sigmoid diverticula.  There is a multiloculated extraluminal gas and fluid collection between the distal sigmoid colon and urinary bladder measuring approximately 6 cm in maximum transverse diameter, with surrounding inflammatory/ edematous change. There is a small amount of fluid in the cul-de-sac with peripheral enhancement. There is a 3 cm pocket of extraluminal fluid in the anterior right pelvis medial to the distal external iliac vessels, showing some peripheral enhancement and probable loculation. No gas in the urinary bladder to suggest fistula.  No adenopathy. Facet degenerative changes in the lower lumbar spine, probably allowing the grade 1 anterolisthesis at L4-5. Previous hysterectomy.  IMPRESSION: 1. Sigmoid diverticulitis with multilocular small pelvic abscesses as well as scattered free intraperitoneal gas. Critical Value/emergent results were called by telephone at the time of interpretation on 09/01/2013 at 10:46 pm to Dr. Daleen Bo , who verbally acknowledged these results. 1.   Electronically Signed   By: Arne Cleveland M.D.   On: 09/01/2013 22:48   Dg Chest Port 1 View  09/02/2013   CLINICAL DATA:  preop  EXAM: PORTABLE CHEST - 1 VIEW  COMPARISON:  04/21/2009  FINDINGS: Heart size upper limits normal for technique. Low lung volumes. New bibasilar poorly marginated interstitial opacities. Atheromatous aorta. No definite effusion. Visualized skeletal structures are unremarkable.  IMPRESSION: 1. Low volumes. Bibasilar subsegmental atelectasis versus  early interstitial edema/ infiltrates.   Electronically Signed   By: Arne Cleveland M.D.   On: 09/02/2013 00:41    Assessment / Plan: 1.  Status post Hartmann's resection for perforated diverticulitis  IV Zosyn  Remove NG tube  Sips clear  liquids  OOB,ambulate - PT to see 2.  Hypertension  IV lopressor - inadequate control  Lasix given last night by Dr. Hassell Done 3.  DM  SSI 4.  Intermittent Afib  Will ask cardiology to evaluate today  EKG on chart  Patient to remain in ICU today until cardiology evaluates.  Monitor UOP with Foley one more day.  Earnstine Regal, MD, [facility] Office: [phone]  09/03/2013

## 2013-09-04 DIAGNOSIS — I10 Essential (primary) hypertension: Secondary | ICD-10-CM

## 2013-09-04 DIAGNOSIS — I4949 Other premature depolarization: Secondary | ICD-10-CM

## 2013-09-04 DIAGNOSIS — K5732 Diverticulitis of large intestine without perforation or abscess without bleeding: Principal | ICD-10-CM

## 2013-09-04 DIAGNOSIS — I491 Atrial premature depolarization: Secondary | ICD-10-CM

## 2013-09-04 LAB — GLUCOSE, CAPILLARY
GLUCOSE-CAPILLARY: 112 mg/dL — AB (ref 70–99)
GLUCOSE-CAPILLARY: 123 mg/dL — AB (ref 70–99)
GLUCOSE-CAPILLARY: 94 mg/dL (ref 70–99)
Glucose-Capillary: 101 mg/dL — ABNORMAL HIGH (ref 70–99)
Glucose-Capillary: 133 mg/dL — ABNORMAL HIGH (ref 70–99)
Glucose-Capillary: 145 mg/dL — ABNORMAL HIGH (ref 70–99)
Glucose-Capillary: 148 mg/dL — ABNORMAL HIGH (ref 70–99)

## 2013-09-04 LAB — CULTURE, ROUTINE-ABSCESS

## 2013-09-04 LAB — CBC
HCT: 26.6 % — ABNORMAL LOW (ref 36.0–46.0)
HEMOGLOBIN: 8.8 g/dL — AB (ref 12.0–15.0)
MCH: 31.5 pg (ref 26.0–34.0)
MCHC: 33.1 g/dL (ref 30.0–36.0)
MCV: 95.3 fL (ref 78.0–100.0)
Platelets: 238 10*3/uL (ref 150–400)
RBC: 2.79 MIL/uL — AB (ref 3.87–5.11)
RDW: 14.2 % (ref 11.5–15.5)
WBC: 14.3 10*3/uL — AB (ref 4.0–10.5)

## 2013-09-04 LAB — BASIC METABOLIC PANEL
Anion gap: 11 (ref 5–15)
BUN: 37 mg/dL — ABNORMAL HIGH (ref 6–23)
CHLORIDE: 103 meq/L (ref 96–112)
CO2: 26 mEq/L (ref 19–32)
Calcium: 9.3 mg/dL (ref 8.4–10.5)
Creatinine, Ser: 1.47 mg/dL — ABNORMAL HIGH (ref 0.50–1.10)
GFR calc Af Amer: 38 mL/min — ABNORMAL LOW (ref >90)
GFR, EST NON AFRICAN AMERICAN: 33 mL/min — AB (ref >90)
GLUCOSE: 120 mg/dL — AB (ref 70–99)
POTASSIUM: 4 meq/L (ref 3.7–5.3)
SODIUM: 140 meq/L (ref 137–147)

## 2013-09-04 MED ORDER — HYDRALAZINE HCL 20 MG/ML IJ SOLN
10.0000 mg | Freq: Four times a day (QID) | INTRAMUSCULAR | Status: DC | PRN
  Administered 2013-09-04 – 2013-09-07 (×3): 10 mg via INTRAVENOUS
  Filled 2013-09-04 (×4): qty 1

## 2013-09-04 NOTE — Evaluation (Signed)
Occupational Therapy Evaluation Patient Details Name: Kimberly Castillo MRN: [id number] DOB: 05/03/1933 Today's Date: 09/04/2013    History of Present Illness 80 yo female s/p Hartmann's resection 09/01/13. Hx of glaucoma, DM, HTN, A fib, postherpetic neuralgia, chronic LBP, shingles.    Clinical Impression    Pt admitted with above. She demonstrates the below listed deficits and will benefit from continued OT to maximize safety and independence with BADLs.  She presents to OT with generalized weakness and pain.  She requires min - max A with BADLs - demonstrates difficulty accessing feet due pain.  She may benefit from use of adaptive equipment.  02 sats 94-96% on RA; HR 108    Follow Up Recommendations  No OT follow up;Supervision/Assistance - 24 hour    Equipment Recommendations  3 in 1 bedside comode;Tub/shower seat    Recommendations for Other Services       Precautions / Restrictions Precautions Precautions: Fall Precaution Comments: abd surgery; drain R flank      Mobility Bed Mobility Overal bed mobility: Needs Assistance Bed Mobility: Sit to Supine       Sit to supine: Min assist   General bed mobility comments: increased time, step by step instruction and assist for LEs and to lift trunk   Transfers Overall transfer level: Needs assistance Equipment used: Rolling walker (2 wheeled) Transfers: Sit to/from [facility] Sit to Stand: Min assist Stand pivot transfers: Min guard       General transfer comment: assist to power up and for balance    Balance Overall balance assessment: Needs assistance Sitting-balance support: Feet supported Sitting balance-Leahy Scale: Good     Standing balance support: Bilateral upper extremity supported Standing balance-Leahy Scale: Fair                              ADL Overall ADL's : Needs assistance/impaired Eating/Feeding: Independent;Sitting;Bed level   Grooming: Wash/dry hands;Wash/dry  face;Oral care;Minimal assistance;Standing   Upper Body Bathing: Minimal assitance;Sitting   Lower Body Bathing: Moderate assistance;Sit to/from stand   Upper Body Dressing : Minimal assistance;Sitting   Lower Body Dressing: Maximal assistance;Sit to/from stand   Toilet Transfer: Ambulation;Comfort height toilet;Grab bars;Minimal assistance   Toileting- Clothing Manipulation and Hygiene: Minimal assistance;Sit to/from stand       Functional mobility during ADLs: Min guard;Rolling walker;Minimal assistance General ADL Comments: Pt demonstrates difficulty accessing feet for LB ADLs.  She requires assist for sit to stand and moves extremely slowly and guarded     Vision                     Perception     Praxis      Pertinent Vitals/Pain Pain Assessment: 0-10 Pain Score: 7  Pain Location: abdomen Pain Descriptors / Indicators: Aching Pain Intervention(s): Monitored during session;Patient requesting pain meds-RN notified     Hand Dominance Right   Extremity/Trunk Assessment Upper Extremity Assessment Upper Extremity Assessment: Generalized weakness   Lower Extremity Assessment Lower Extremity Assessment: Defer to PT evaluation   Cervical / Trunk Assessment Cervical / Trunk Assessment: Normal   Communication Communication Communication: No difficulties   Cognition Arousal/Alertness: Awake/alert Behavior During Therapy: WFL for tasks assessed/performed Overall Cognitive Status: Within Functional Limits for tasks assessed                     General Comments       Exercises  Shoulder Instructions      Home Living Family/patient expects to be discharged to:: Private residence Living Arrangements: Children;Other relatives Available Help at Discharge: Family;Available 24 hours/day Type of Home: House Home Access: Stairs to enter [employer] of Steps: 3 steps    Home Layout: One level     Bathroom Shower/Tub: Arts administrator: Standard     Home Equipment: Environmental consultant - 2 wheels;Cane - single point          Prior Functioning/Environment Level of Independence: Independent with assistive device(s)        Comments: used walker when needed    OT Diagnosis: Generalized weakness;Acute pain   OT Problem List: Decreased strength;Decreased activity tolerance;Impaired balance (sitting and/or standing);Decreased knowledge of use of DME or AE;Decreased knowledge of precautions;Pain   OT Treatment/Interventions: Self-care/ADL training;DME and/or AE instruction;Therapeutic activities;Patient/family education;Balance training    OT Goals(Current goals can be found in the care plan section) Acute Rehab OT Goals Patient Stated Goal: home. less pain OT Goal Formulation: With patient Time For Goal Achievement: 09/18/13 Potential to Achieve Goals: Good ADL Goals Pt Will Perform Grooming: with supervision;standing Pt Will Perform Upper Body Bathing: with supervision;with set-up;sitting Pt Will Perform Lower Body Bathing: with supervision;with adaptive equipment;sit to/from stand Pt Will Perform Upper Body Dressing: with set-up;sitting Pt Will Perform Lower Body Dressing: with supervision;with adaptive equipment;sit to/from stand Pt Will Transfer to Toilet: with supervision;ambulating;regular height toilet;grab bars Pt Will Perform Toileting - Clothing Manipulation and hygiene: with supervision;sit to/from stand Pt Will Perform Tub/Shower Transfer: Shower transfer;with supervision;ambulating;rolling walker;shower seat  OT Frequency: Min 2X/week   Barriers to D/C:            Co-evaluation              End of Session Equipment Utilized During Treatment: Surveyor, mining Communication: Mobility status  Activity Tolerance: Patient tolerated treatment well Patient left: in chair;with call bell/phone within reach;with nursing/sitter in room   Time: [id number] OT Time Calculation (min):  37 min Charges:  OT General Charges $OT Visit: 1 Procedure OT Evaluation $Initial OT Evaluation Tier I: 1 Procedure OT Treatments $Therapeutic Activity: 23-37 mins G-Codes:    Nyeemah Jennette M 2013-09-30, 6:56 PM

## 2013-09-04 NOTE — Anesthesia Postprocedure Evaluation (Signed)
  Anesthesia Post-op Note  Patient: Kimberly Castillo  Procedure(s) Performed: Procedure(s) (LRB): Hartman colectomy (N/A)  Patient Location: PACU  Anesthesia Type: General  Level of Consciousness: awake and alert   Airway and Oxygen Therapy: Patient Spontanous Breathing  Post-op Pain: mild  Post-op Assessment: Post-op Vital signs reviewed, Patient's Cardiovascular Status Stable, Respiratory Function Stable, Patent Airway and No signs of Nausea or vomiting  Last Vitals:  Filed Vitals:   09/04/13 1000  BP: 151/67  Pulse:   Temp:   Resp: 22    Post-op Vital Signs: stable   Complications: No apparent anesthesia complications

## 2013-09-04 NOTE — Progress Notes (Signed)
Patient ID: Kimberly Castillo, female   DOB: 1933-11-23, 80 y.o.   MRN: [id number]  [facility] - Progress Note  POD# 2  Subjective: Patient up in bed.  Tolerating clear liquid diet.  Ambulated in hall yesterday with PT.  Mild abdominal pain.  Objective: Vital signs in last 24 hours: Temp:  [98.1 F (36.7 C)-99.3 F (37.4 C)] 98.2 F (36.8 C) (09/04 0800) Resp:  [10-32] 14 (09/04 0400) BP: (143-187)/(58-89) 176/72 mmHg (09/04 0400) SpO2:  [92 %-100 %] 99 % (09/04 0400) Weight:  [163 lb 9.3 oz (74.2 kg)] 163 lb 9.3 oz (74.2 kg) (09/04 0400) Last BM Date: 09/03/13 (sm smear)  Intake/Output from previous day: 09/03 0701 - 09/04 0700 In: 1375 [I.V.:1225; IV Piggyback:150] Out: [id number] [Urine:1755; Drains:85]  Exam: HEENT - clear, not icteric Neck - soft Chest - clear bilaterally Cor - RRR, no murmur Abd - mild distension, soft; few BS present; stoma viable left abd wall, no output; dressing dry and intact Ext - no significant edema Neuro - grossly intact, no focal deficits  Lab Results:   Recent Labs  09/03/13 0355 09/04/13 0343  WBC 18.6* 14.3*  HGB 9.9* 8.8*  HCT 30.2* 26.6*  PLT 278 238     Recent Labs  09/03/13 0355 09/04/13 0343  NA 145 140  K 4.0 4.0  CL 106 103  CO2 22 26  GLUCOSE 127* 120*  BUN 31* 37*  CREATININE 1.41* 1.47*  CALCIUM 9.3 9.3    Studies/Results: No results found.  Assessment / Plan: 1.  Status post Hartmann's resection for perforated diverticulitis  Clear liquid diet  IV Zosyn  Wound and ostomy care - appreciate Cross consult 2.  DM  SSI 3.  HTN / PAC's / PVC's  Appreciate cardiology consult  Metoprolol / apresoline  Patient is stable and doing well overall.  Will transfer to floor today.  Telemetry not needed.  Earnstine Regal, MD, [facility] Office: [phone]  09/04/2013

## 2013-09-04 NOTE — Progress Notes (Signed)
TELEMETRY: Reviewed telemetry pt in NSR with occ PVCs, PACs and short 3-4 beat runs of PACs: Filed Vitals:   09/03/13 2213 09/04/13 0000 09/04/13 0159 09/04/13 0400  BP: 166/82 182/66 167/89 176/72  Pulse:      Temp:  99.3 F (37.4 C)  99 F (37.2 C)  TempSrc:  Oral  Oral  Resp:  19  14  Height:      Weight:    163 lb 9.3 oz (74.2 kg)  SpO2:  99%  99%    Intake/Output Summary (Last 24 hours) at 09/04/13 0711 Last data filed at 09/04/13 0600  Gross per 24 hour  Intake   1375 ml  Output   1840 ml  Net   -465 ml   Filed Weights   09/02/13 0600 09/03/13 0400 09/04/13 0400  Weight: 163 lb 2.3 oz (74 kg) 163 lb 2.3 oz (74 kg) 163 lb 9.3 oz (74.2 kg)    Subjective Feels well this am. No palpitations, chest pain, or SOB.  . heparin subcutaneous  5,000 Units Subcutaneous 3 times per day  . insulin aspart  0-15 Units Subcutaneous 6 times per day  . ketotifen  1 drop Both Eyes BID  . latanoprost  1 drop Both Eyes QHS  . levobunolol  1 drop Both Eyes BID  . metoprolol  2.5 mg Intravenous 4 times per day  . pantoprazole (PROTONIX) IV  40 mg Intravenous Q24H  . piperacillin-tazobactam (ZOSYN)  IV  3.375 g Intravenous Q8H   . dexrose 5 % and 0.45 % NaCl with KCl 30 mEq/L 75 mL/hr at 09/03/13 0957    LABS: Basic Metabolic Panel:  Recent Labs  09/03/13 0355 09/04/13 0343  NA 145 140  K 4.0 4.0  CL 106 103  CO2 22 26  GLUCOSE 127* 120*  BUN 31* 37*  CREATININE 1.41* 1.47*  CALCIUM 9.3 9.3   Liver Function Tests:  Recent Labs  09/01/13 1931  AST 11  ALT 11  ALKPHOS 77  BILITOT 0.6  PROT 7.7  ALBUMIN 2.8*    Recent Labs  09/01/13 1931  LIPASE 17   CBC:  Recent Labs  09/01/13 1931  09/03/13 0355 09/04/13 0343  WBC 16.3*  < > 18.6* 14.3*  NEUTROABS 13.1*  --   --   --   HGB 11.6*  < > 9.9* 8.8*  HCT 34.7*  < > 30.2* 26.6*  MCV 96.1  < > 96.8 95.3  PLT 308  < > 278 238  < > = values in this interval not displayed. Cardiac Enzymes: No results  found for this basename: CKTOTAL, CKMB, CKMBINDEX, TROPONINI,  in the last 72 hours BNP: No results found for this basename: PROBNP,  in the last 72 hours D-Dimer: No results found for this basename: DDIMER,  in the last 72 hours Hemoglobin A1C: No results found for this basename: HGBA1C,  in the last 72 hours Fasting Lipid Panel: No results found for this basename: CHOL, HDL, LDLCALC, TRIG, CHOLHDL, LDLDIRECT,  in the last 72 hours Thyroid Function Tests: No results found for this basename: TSH, T4TOTAL, FREET3, T3FREE, THYROIDAB,  in the last 72 hours   Radiology/Studies:  No results found.  PHYSICAL EXAM General: Well developed, obese, in no acute distress. Head: Normal Neck: Negative for carotid bruits. JVD not elevated. No adenopathy Lungs: Clear bilaterally to auscultation without wheezes, rales, or rhonchi. Breathing is unlabored. Heart: RRR S1 S2 without murmurs, rubs, or gallops.  Abdomen: Soft, non-tender, non-distended  with normoactive bowel sounds. Surgical dressing in place LLQ with drain. Extremities: No clubbing, cyanosis or edema.  Distal pedal pulses are 2+ and equal bilaterally. Neuro: Alert and oriented X 3. Moves all extremities spontaneously. Psych:  Responds to questions appropriately with a normal affect.  ASSESSMENT AND PLAN: 1. PACs and PVCs- asymptomatic. No evidence of atrial fibrillation on review of monitor. On IV metoprolol for now. Transition to po when able. Maintain potassium >4.0.  2. Hypertension - on losartan HCT prior to admit. Resume when able to take po. 3. Diabetes  4. Hyperlipidemia  5. chronic kidney disease  6. history of shingles  7. Post-op anemia  8. Leukocytosis 2/2 perforated diverticulitis  9. Perforated diverticulitis: main problem during this admission  - s/p emergent laparotomy and Hartman colectomy in 9/2 am   Patient is stable from a cardiac standpoint. Will cover BP with IV apresoline until patient able to take po then  resume prior meds. Will sign off. Please call with further questions/concerns.   Present on Admission:  . Diverticulitis of colon with perforation  Signed, Kimberly Castillo, [place] 09/04/2013 7:11 AM

## 2013-09-04 NOTE — Progress Notes (Signed)
PT Cancellation Note  Patient Details Name: Kimberly Castillo MRN: [id number] DOB: 1933-09-12   Cancelled Treatment:    Reason Eval/Treat Not Completed: attempted PT tx session however OT just finsihing session-walked with pt in hallway already. Will check back another day.    Weston Anna, MPT Pager: [phone]

## 2013-09-05 LAB — CBC
HCT: 30.2 % — ABNORMAL LOW (ref 36.0–46.0)
HEMOGLOBIN: 9.7 g/dL — AB (ref 12.0–15.0)
MCH: 31 pg (ref 26.0–34.0)
MCHC: 32.1 g/dL (ref 30.0–36.0)
MCV: 96.5 fL (ref 78.0–100.0)
Platelets: 270 10*3/uL (ref 150–400)
RBC: 3.13 MIL/uL — ABNORMAL LOW (ref 3.87–5.11)
RDW: 14.1 % (ref 11.5–15.5)
WBC: 12.2 10*3/uL — AB (ref 4.0–10.5)

## 2013-09-05 LAB — BASIC METABOLIC PANEL
Anion gap: 12 (ref 5–15)
BUN: 28 mg/dL — ABNORMAL HIGH (ref 6–23)
CO2: 24 mEq/L (ref 19–32)
Calcium: 9.3 mg/dL (ref 8.4–10.5)
Chloride: 103 mEq/L (ref 96–112)
Creatinine, Ser: 1.25 mg/dL — ABNORMAL HIGH (ref 0.50–1.10)
GFR calc Af Amer: 46 mL/min — ABNORMAL LOW (ref >90)
GFR, EST NON AFRICAN AMERICAN: 40 mL/min — AB (ref >90)
GLUCOSE: 145 mg/dL — AB (ref 70–99)
POTASSIUM: 4 meq/L (ref 3.7–5.3)
Sodium: 139 mEq/L (ref 137–147)

## 2013-09-05 LAB — GLUCOSE, CAPILLARY
Glucose-Capillary: 112 mg/dL — ABNORMAL HIGH (ref 70–99)
Glucose-Capillary: 128 mg/dL — ABNORMAL HIGH (ref 70–99)
Glucose-Capillary: 135 mg/dL — ABNORMAL HIGH (ref 70–99)
Glucose-Capillary: 147 mg/dL — ABNORMAL HIGH (ref 70–99)
Glucose-Capillary: 148 mg/dL — ABNORMAL HIGH (ref 70–99)
Glucose-Capillary: 161 mg/dL — ABNORMAL HIGH (ref 70–99)

## 2013-09-05 NOTE — Progress Notes (Signed)
3 Days Post-Op  Subjective: Tolerated clear liquids well.  Walking.  Objective: Vital signs in last 24 hours: Temp:  [98.1 F (36.7 C)-99.6 F (37.6 C)] 99.2 F (37.3 C) (09/04 2100) Pulse Rate:  [78-117] 78 (09/04 2100) Resp:  [12-22] 18 (09/04 2100) BP: (151-177)/(51-98) 165/72 mmHg (09/04 2100) SpO2:  [93 %-100 %] 94 % (09/04 2100) Last BM Date: 09/03/13  Intake/Output from previous day: 09/04 0701 - 09/05 0700 In: 1725 [I.V.:1575; IV Piggyback:150] Out: 1970 [Urine:1835; Drains:135] Intake/Output this shift:    PE: General- In NAD  CV-irregular Abdomen-soft, open wound clean, colostomy pink with gas in bag, serous drain output  Lab Results:   Recent Labs  09/04/13 0343 09/05/13 0532  WBC 14.3* 12.2*  HGB 8.8* 9.7*  HCT 26.6* 30.2*  PLT 238 270   BMET  Recent Labs  09/04/13 0343 09/05/13 0532  NA 140 139  K 4.0 4.0  CL 103 103  CO2 26 24  GLUCOSE 120* 145*  BUN 37* 28*  CREATININE 1.47* 1.25*  CALCIUM 9.3 9.3   PT/INR No results found for this basename: LABPROT, INR,  in the last 72 hours Comprehensive Metabolic Panel:    Component Value Date/Time   NA 139 09/05/2013 0532   NA 140 09/04/2013 0343   K 4.0 09/05/2013 0532   K 4.0 09/04/2013 0343   CL 103 09/05/2013 0532   CL 103 09/04/2013 0343   CO2 24 09/05/2013 0532   CO2 26 09/04/2013 0343   BUN 28* 09/05/2013 0532   BUN 37* 09/04/2013 0343   CREATININE 1.25* 09/05/2013 0532   CREATININE 1.47* 09/04/2013 0343   GLUCOSE 145* 09/05/2013 0532   GLUCOSE 120* 09/04/2013 0343   CALCIUM 9.3 09/05/2013 0532   CALCIUM 9.3 09/04/2013 0343   AST 11 09/01/2013 1931   AST 19 06/17/2013 1002   ALT 11 09/01/2013 1931   ALT 16 06/17/2013 1002   ALKPHOS 77 09/01/2013 1931   ALKPHOS 67 06/17/2013 1002   BILITOT 0.6 09/01/2013 1931   BILITOT 0.6 06/17/2013 1002   PROT 7.7 09/01/2013 1931   PROT 7.3 06/17/2013 1002   ALBUMIN 2.8* 09/01/2013 1931   ALBUMIN 3.8 06/17/2013 1002     Studies/Results: No results  found.  Anti-infectives: Anti-infectives   Start     Dose/Rate Route Frequency Ordered Stop   09/02/13 2359  piperacillin-tazobactam (ZOSYN) IVPB 3.375 g     3.375 g 12.5 mL/hr over 240 Minutes Intravenous Every 8 hours 09/01/13 2347     09/01/13 2245  cefTRIAXone (ROCEPHIN) 1 g in dextrose 5 % 50 mL IVPB     1 g 100 mL/hr over 30 Minutes Intravenous  Once 09/01/13 2236 09/01/13 2321      Assessment 1. Status post Hartmann's resection for perforated diverticulitis 09/02/13-on IV Zosyn; bowel function starting to return.  2.  Wound and ostomy care -  [place] nurse seeing her; wound looks good.  3.  DM-cbgs 94-148 on SSI  4. HTN / PAC's / PVC's - Cardiology involved; on Metoprolol / apresoline      LOS: 4 days   Plan: Advance to full liquids.  Ask WOC to put wound VAC on.   Kimberly Castillo 09/05/2013

## 2013-09-06 LAB — BASIC METABOLIC PANEL
Anion gap: 9 (ref 5–15)
BUN: 18 mg/dL (ref 6–23)
CALCIUM: 9.1 mg/dL (ref 8.4–10.5)
CO2: 24 mEq/L (ref 19–32)
Chloride: 105 mEq/L (ref 96–112)
Creatinine, Ser: 1.16 mg/dL — ABNORMAL HIGH (ref 0.50–1.10)
GFR calc Af Amer: 51 mL/min — ABNORMAL LOW (ref >90)
GFR, EST NON AFRICAN AMERICAN: 44 mL/min — AB (ref >90)
Glucose, Bld: 139 mg/dL — ABNORMAL HIGH (ref 70–99)
Potassium: 4.1 mEq/L (ref 3.7–5.3)
SODIUM: 138 meq/L (ref 137–147)

## 2013-09-06 LAB — GLUCOSE, CAPILLARY
GLUCOSE-CAPILLARY: 135 mg/dL — AB (ref 70–99)
GLUCOSE-CAPILLARY: 110 mg/dL — AB (ref 70–99)
Glucose-Capillary: 133 mg/dL — ABNORMAL HIGH (ref 70–99)
Glucose-Capillary: 159 mg/dL — ABNORMAL HIGH (ref 70–99)

## 2013-09-06 LAB — CBC
HCT: 28.1 % — ABNORMAL LOW (ref 36.0–46.0)
Hemoglobin: 9.3 g/dL — ABNORMAL LOW (ref 12.0–15.0)
MCH: 31.6 pg (ref 26.0–34.0)
MCHC: 33.1 g/dL (ref 30.0–36.0)
MCV: 95.6 fL (ref 78.0–100.0)
Platelets: 262 10*3/uL (ref 150–400)
RBC: 2.94 MIL/uL — ABNORMAL LOW (ref 3.87–5.11)
RDW: 13.9 % (ref 11.5–15.5)
WBC: 12.7 10*3/uL — ABNORMAL HIGH (ref 4.0–10.5)

## 2013-09-06 NOTE — Consult Note (Signed)
WOC wound consult note Reason for Consult: Place/Initiate NPWT dressing to midline incision Wound type:Surgical Pressure Ulcer POA:No Measurement:17cm x 2.5cm x 3.5cm Wound HDQ:QIWL pink, dry Drainage (amount, consistency, odor) scant amount of serous exudate on old dressing Periwound:intact Dressing procedure/placement/frequency:NPWT initiated per Dr. Bertrum Sol request.  1 piece of black foam used to fill defect, seal achieved without difficulty and maintained at 124mHg continuous pressure. Dressing label applied with measurements, date dressing applied and # and type of dressings used as a wound contact layer. No Mepitel (silicone non-adherent dressing) used this dressing change and patient tolerated procedure well.  WOC ostomy follow up Stoma type/location: LLQ colostomy Stomal assessment/size: 1 and 1/4 inches round when light traction applied to skin at 12 o'clock, slightly oval at rest. Peristomal assessment: crease in the immediate peristomal plane from 3-8 o'clock. Treatment options for stomal/peristomal skin: A skin barrier ring is placed in the immediate peristomal area Output Scant amount light yellow mucous in pouch.  RN reports small amount of flatus this am. Ostomy pouching: 1pc. flexible pouching system with skin barrier ring  Education provided: Patient looks only briefly at stoma today; tells me that she is going to "need help" with this. Educational booklet left at bedside for patient or her daughter and/or granddaughter whom I met while patient was in ICU. Disposition is not yet clear, but patient/family will require assistance with reinforcement of ostomy teaching including emptying and changing.  I will continue to teach patient about emptying while here in acute care.  Now with midline npwt dressing.  Changes are T-TH-Sa this week, may be changed to M-W-F the following week. [place] Yes  [facility] will continue   follow, and will remain available to this patient, the nursing, surgical  and medical teams.  . Thanks, LMaudie Flakes MSN, RN, [phone]

## 2013-09-06 NOTE — Progress Notes (Signed)
4 Days Post-Op  Subjective: Gets full fast but not having any nausea.  Wants to stay on liquids.  VAC not put on yesterday.  Objective: Vital signs in last 24 hours: Temp:  [98.5 F (36.9 C)-99.9 F (37.7 C)] 98.5 F (36.9 C) (09/06 0600) Pulse Rate:  [43-80] 67 (09/06 0600) Resp:  [16] 16 (09/06 0600) BP: (145-176)/(66-116) 163/66 mmHg (09/06 0600) SpO2:  [97 %-99 %] 98 % (09/06 0600) Last BM Date: 09/03/13  Intake/Output from previous day: 09/05 0701 - 09/06 0700 In: 2868.8 [P.O.:840; I.V.:1878.8; IV Piggyback:150] Out: 2375 [Urine:2175; Drains:200] Intake/Output this shift:    PE: General- In NAD Abdomen-soft, open wound clean, colostomy pink with thin yellow liquid in bag, serous drain output  Lab Results:   Recent Labs  09/05/13 0532 09/06/13 0636  WBC 12.2* 12.7*  HGB 9.7* 9.3*  HCT 30.2* 28.1*  PLT 270 262   BMET  Recent Labs  09/05/13 0532 09/06/13 0636  NA 139 138  K 4.0 4.1  CL 103 105  CO2 24 24  GLUCOSE 145* 139*  BUN 28* 18  CREATININE 1.25* 1.16*  CALCIUM 9.3 9.1   PT/INR No results found for this basename: LABPROT, INR,  in the last 72 hours Comprehensive Metabolic Panel:    Component Value Date/Time   NA 138 09/06/2013 0636   NA 139 09/05/2013 0532   K 4.1 09/06/2013 0636   K 4.0 09/05/2013 0532   CL 105 09/06/2013 0636   CL 103 09/05/2013 0532   CO2 24 09/06/2013 0636   CO2 24 09/05/2013 0532   BUN 18 09/06/2013 0636   BUN 28* 09/05/2013 0532   CREATININE 1.16* 09/06/2013 0636   CREATININE 1.25* 09/05/2013 0532   GLUCOSE 139* 09/06/2013 0636   GLUCOSE 145* 09/05/2013 0532   CALCIUM 9.1 09/06/2013 0636   CALCIUM 9.3 09/05/2013 0532   AST 11 09/01/2013 1931   AST 19 06/17/2013 1002   ALT 11 09/01/2013 1931   ALT 16 06/17/2013 1002   ALKPHOS 77 09/01/2013 1931   ALKPHOS 67 06/17/2013 1002   BILITOT 0.6 09/01/2013 1931   BILITOT 0.6 06/17/2013 1002   PROT 7.7 09/01/2013 1931   PROT 7.3 06/17/2013 1002   ALBUMIN 2.8* 09/01/2013 1931   ALBUMIN 3.8 06/17/2013 1002      Studies/Results: No results found.  Anti-infectives: Anti-infectives   Start     Dose/Rate Route Frequency Ordered Stop   09/02/13 2359  piperacillin-tazobactam (ZOSYN) IVPB 3.375 g     3.375 g 12.5 mL/hr over 240 Minutes Intravenous Every 8 hours 09/01/13 2347     09/01/13 2245  cefTRIAXone (ROCEPHIN) 1 g in dextrose 5 % 50 mL IVPB     1 g 100 mL/hr over 30 Minutes Intravenous  Once 09/01/13 2236 09/01/13 2321      Assessment 1. Status post Hartmann's resection for perforated diverticulitis 09/02/13-on IV Zosyn; bowel function slowly returning.  2.  Wound and ostomy care -  [place] nurse seeing her; wound looks good.  3.  DM-cbgs 94-148 on SSI  4. HTN / PAC's / PVC's - Cardiology involved; on Metoprolol / apresoline      LOS: 5 days   Plan:  Keep on full liquids.  Order VAC supplies.   Analyah Castillo J 09/06/2013

## 2013-09-07 LAB — ANAEROBIC CULTURE

## 2013-09-07 LAB — GLUCOSE, CAPILLARY
GLUCOSE-CAPILLARY: 101 mg/dL — AB (ref 70–99)
GLUCOSE-CAPILLARY: 162 mg/dL — AB (ref 70–99)
GLUCOSE-CAPILLARY: 163 mg/dL — AB (ref 70–99)
Glucose-Capillary: 140 mg/dL — ABNORMAL HIGH (ref 70–99)
Glucose-Capillary: 123 mg/dL — ABNORMAL HIGH (ref 70–99)
Glucose-Capillary: 116 mg/dL — ABNORMAL HIGH (ref 70–99)
Glucose-Capillary: 156 mg/dL — ABNORMAL HIGH (ref 70–99)

## 2013-09-07 MED ORDER — CLONIDINE HCL 0.1 MG/24HR TD PTWK
0.1000 mg | MEDICATED_PATCH | TRANSDERMAL | Status: DC
  Administered 2013-09-07: 0.1 mg via TRANSDERMAL
  Filled 2013-09-07: qty 1

## 2013-09-07 NOTE — Progress Notes (Signed)
Pt bp at 2130 was 201/90. Administered pt's scheduled Metoprolol.  Rechecked b/p an hour later and it was 180/70. Then administered 10 mg Hydralazine. When I attempted to recheck pt's bp she refused, saying she was tired of [place] "blowing up her arm". Will attempt to recheck pressure at 0600.

## 2013-09-07 NOTE — Progress Notes (Signed)
Occupational Therapy Treatment Patient Details Name: Kimberly Castillo MRN: [id number] DOB: June 02, 1933 Today's Date: 09/07/2013    History of present illness 80 yo female s/p Hartmann's resection 09/01/13. Hx of glaucoma, DM, HTN, A fib, postherpetic neuralgia, chronic LBP, shingles.    OT comments  Pt had difficulty processing cues today and moves very slowly.  She had her own plan today and was difficult to redirect.    Follow Up Recommendations  Supervision/Assistance - 24 hour;Home health OT    Equipment Recommendations   (can use 3;1 which she has in shower stall)    Recommendations for Other Services      Precautions / Restrictions Precautions Precautions: Fall Restrictions Weight Bearing Restrictions: No       Mobility Bed Mobility           Sit to supine: Mod assist;HOB elevated   General bed mobility comments: brought HOB up as she wasn't processing sidelying  Transfers     Transfers: Sit to/from Stand Sit to Stand: Min assist         General transfer comment: cues for UE placement    Balance                                   ADL Overall ADL's : Needs assistance/impaired                                       General ADL Comments: ambulated to bathroom for grooming:  pt reported being tired and did not want to complete grooming task.  Ambulated back to chair, and when she was almost in front of it, she stated she wanted to walk to door then return to chair.  She actually wanted to walk to window seat, sat and rested then wanted to return to bed.  I had told her PT would come to walk with her, but she said she wanted to rest first.  Pt is able to cross bil LEs for adls.  She was able to straighten socks:  did not educate on AE.  She does have a 3:1 at home and recommended that her daughter place this over her commode to make getting up and sitting down easier.  pt needed assistance with RW to maneuver around obstacles as she ran into  things on the way to the bathroom; she self corrected later when getting up from window seat      Vision                    Perception     Praxis      Cognition   Behavior During Therapy: [facility] for tasks assessed/performed Overall Cognitive Status: Impaired/Different from baseline Area of Impairment: Following commands;Problem solving;Safety/judgement                General Comments: pt needed multimodal cues to follow commands with sit to stand and closed eyes often but was attending to therapist.  Bumped into obstacles and did not correct initially.      Extremity/Trunk Assessment               Exercises     Shoulder Instructions       General Comments      Pertinent Vitals/ Pain       Pain Assessment: No/denies pain  Home Living  Prior Functioning/Environment              Frequency Min 2X/week     Progress Toward Goals  OT Goals(current goals can now be found in the care plan section)  Progress towards OT goals: Progressing toward goals (slowly)     Plan      Co-evaluation                 End of Session     Activity Tolerance Patient tolerated treatment well   Patient Left in bed;with call bell/phone within reach;with family/visitor present   Nurse Communication          Time: 1243-1330 OT Time Calculation (min): 47 min  Charges: OT General Charges $OT Visit: 1 Procedure OT Treatments $Therapeutic Activity: 38-52 mins  Sederick Jacobsen 09/07/2013, 2:06 PM  Lesle Chris, OTR/L [phone] 09/07/2013

## 2013-09-07 NOTE — Progress Notes (Signed)
5 Days Post-Op  Subjective: No nausea or vomiting.  No significant colostomy output.  Limited mobility.  Objective: Vital signs in last 24 hours: Temp:  [98.6 F (37 C)-99.3 F (37.4 C)] 99.3 F (37.4 C) (09/07 0550) Pulse Rate:  [77-81] 78 (09/07 0550) Resp:  [16-18] 18 (09/07 0550) BP: (148-201)/(47-90) 153/47 mmHg (09/07 0550) SpO2:  [96 %-98 %] 97 % (09/07 0550) Last BM Date: 09/03/13  Intake/Output from previous day: 09/06 0701 - 09/07 0700 In: 2306.3 [P.O.:360; I.V.:1796.3; IV Piggyback:150] Out: 2330 [Urine:2300; Drains:30] Intake/Output this shift:    PE: General- In NAD Abdomen-soft, VAC on, colostomy pink with thin small amount of gas in bag, serous drain output  Lab Results:   Recent Labs  09/05/13 0532 09/06/13 0636  WBC 12.2* 12.7*  HGB 9.7* 9.3*  HCT 30.2* 28.1*  PLT 270 262   BMET  Recent Labs  09/05/13 0532 09/06/13 0636  NA 139 138  K 4.0 4.1  CL 103 105  CO2 24 24  GLUCOSE 145* 139*  BUN 28* 18  CREATININE 1.25* 1.16*  CALCIUM 9.3 9.1   PT/INR No results found for this basename: LABPROT, INR,  in the last 72 hours Comprehensive Metabolic Panel:    Component Value Date/Time   NA 138 09/06/2013 0636   NA 139 09/05/2013 0532   K 4.1 09/06/2013 0636   K 4.0 09/05/2013 0532   CL 105 09/06/2013 0636   CL 103 09/05/2013 0532   CO2 24 09/06/2013 0636   CO2 24 09/05/2013 0532   BUN 18 09/06/2013 0636   BUN 28* 09/05/2013 0532   CREATININE 1.16* 09/06/2013 0636   CREATININE 1.25* 09/05/2013 0532   GLUCOSE 139* 09/06/2013 0636   GLUCOSE 145* 09/05/2013 0532   CALCIUM 9.1 09/06/2013 0636   CALCIUM 9.3 09/05/2013 0532   AST 11 09/01/2013 1931   AST 19 06/17/2013 1002   ALT 11 09/01/2013 1931   ALT 16 06/17/2013 1002   ALKPHOS 77 09/01/2013 1931   ALKPHOS 67 06/17/2013 1002   BILITOT 0.6 09/01/2013 1931   BILITOT 0.6 06/17/2013 1002   PROT 7.7 09/01/2013 1931   PROT 7.3 06/17/2013 1002   ALBUMIN 2.8* 09/01/2013 1931   ALBUMIN 3.8 06/17/2013 1002     Studies/Results: No  results found.  Anti-infectives: Anti-infectives   Start     Dose/Rate Route Frequency Ordered Stop   09/02/13 2359  piperacillin-tazobactam (ZOSYN) IVPB 3.375 g     3.375 g 12.5 mL/hr over 240 Minutes Intravenous Every 8 hours 09/01/13 2347     09/01/13 2245  cefTRIAXone (ROCEPHIN) 1 g in dextrose 5 % 50 mL IVPB     1 g 100 mL/hr over 30 Minutes Intravenous  Once 09/01/13 2236 09/01/13 2321      Assessment 1. Status post Hartmann's resection for perforated diverticulitis 09/02/13-on IV Zosyn; still with some ileus.  2.  Wound and ostomy care -  VAC placed yesterday.  3.  DM-cbgs 110-163 on SSI  4. HTN / PAC's / PVC's - stable with medications.  5.  AKI-has been improving.      LOS: 6 days   Plan:   Change VAC Tu, Th, Sa.  Check lab tomorrow.  Leave diet at full liquids.   Benedetta Sundstrom J 09/07/2013

## 2013-09-08 LAB — CBC
HCT: 28.1 % — ABNORMAL LOW (ref 36.0–46.0)
HEMOGLOBIN: 9.3 g/dL — AB (ref 12.0–15.0)
MCH: 31.8 pg (ref 26.0–34.0)
MCHC: 33.1 g/dL (ref 30.0–36.0)
MCV: 96.2 fL (ref 78.0–100.0)
Platelets: 311 10*3/uL (ref 150–400)
RBC: 2.92 MIL/uL — AB (ref 3.87–5.11)
RDW: 14.1 % (ref 11.5–15.5)
WBC: 10.9 10*3/uL — ABNORMAL HIGH (ref 4.0–10.5)

## 2013-09-08 LAB — BASIC METABOLIC PANEL
Anion gap: 10 (ref 5–15)
BUN: 10 mg/dL (ref 6–23)
CALCIUM: 9.1 mg/dL (ref 8.4–10.5)
CO2: 24 meq/L (ref 19–32)
Chloride: 102 mEq/L (ref 96–112)
Creatinine, Ser: 1.44 mg/dL — ABNORMAL HIGH (ref 0.50–1.10)
GFR calc Af Amer: 39 mL/min — ABNORMAL LOW (ref >90)
GFR calc non Af Amer: 34 mL/min — ABNORMAL LOW (ref >90)
GLUCOSE: 135 mg/dL — AB (ref 70–99)
Potassium: 4.7 mEq/L (ref 3.7–5.3)
Sodium: 136 mEq/L — ABNORMAL LOW (ref 137–147)

## 2013-09-08 LAB — GLUCOSE, CAPILLARY
GLUCOSE-CAPILLARY: 105 mg/dL — AB (ref 70–99)
GLUCOSE-CAPILLARY: 129 mg/dL — AB (ref 70–99)
GLUCOSE-CAPILLARY: 138 mg/dL — AB (ref 70–99)
Glucose-Capillary: 147 mg/dL — ABNORMAL HIGH (ref 70–99)
Glucose-Capillary: 149 mg/dL — ABNORMAL HIGH (ref 70–99)
Glucose-Capillary: 91 mg/dL (ref 70–99)
Glucose-Capillary: 96 mg/dL (ref 70–99)

## 2013-09-08 MED ORDER — HYDROCODONE-ACETAMINOPHEN 5-325 MG PO TABS
1.0000 | ORAL_TABLET | ORAL | Status: DC | PRN
  Administered 2013-09-08: 2 via ORAL
  Administered 2013-09-08 – 2013-09-09 (×2): 1 via ORAL
  Administered 2013-09-09: 2 via ORAL
  Administered 2013-09-09: 1 via ORAL
  Administered 2013-09-09 – 2013-09-10 (×4): 2 via ORAL
  Filled 2013-09-08: qty 2
  Filled 2013-09-08: qty 1
  Filled 2013-09-08: qty 2
  Filled 2013-09-08 (×2): qty 1
  Filled 2013-09-08 (×3): qty 2
  Filled 2013-09-08: qty 1
  Filled 2013-09-08: qty 2

## 2013-09-08 MED ORDER — MORPHINE SULFATE 2 MG/ML IJ SOLN
2.0000 mg | INTRAMUSCULAR | Status: DC | PRN
  Administered 2013-09-08 – 2013-09-09 (×5): 4 mg via INTRAVENOUS
  Filled 2013-09-08 (×5): qty 2

## 2013-09-08 NOTE — Consult Note (Signed)
WOC ostomy follow up Stoma type/location: LLQ Colostomy Stomal assessment/size: 1 and 1/4 inches  Peristomal assessment: Not seen today, pouching system applied on Sunday is still intact.  Will plan for change tomorrow. Supplies in room. Treatment options for stomal/peristomal skin: Not seen today.  Skin barrier ring used on Sunday to fill minor defect in the parastomal plane Output liquid brown stool and flatus Ostomy pouching: 1pc.flexible pouch (#725) and skin barrier ring [phone])  Education provided: None today. Enrolled patient in [facility] Discharge program: Yes Will plan for routine pouch change tomorrow.  Staff is reminded to empty pouch when 1/3 to 1/2 full of flatus or stool to ovoid overfilling and leakage. [facility] nursing team will not follow, but will remain available to this patient, the nursing and medical team.  Please re-consult if needed. Thanks, Maudie Flakes, MSN, RN, [place], [place], [place] [phone])

## 2013-09-08 NOTE — Progress Notes (Signed)
ANTIBIOTIC CONSULT NOTE - INITIAL  Pharmacy Consult for zosyn Indication: Intra-abdominal  No Known Allergies  Patient Measurements: Height: 5\' 7"  (170.2 cm) Weight: 163 lb 9.3 oz (74.2 kg) IBW/kg (Calculated) : 61.6 Adjusted Body Weight:   Vital Signs: Temp: 98.4 F (36.9 C) (09/08 0550) Temp src: Oral (09/08 0550) BP: 158/66 mmHg (09/08 0550) Pulse Rate: 62 (09/08 0550) Intake/Output from previous day: 09/07 0701 - 09/08 0700 In: 1800 [P.O.:1800] Out: 3305 [Urine:2950; Drains:205; Stool:150] Intake/Output from this shift:    Labs:  Recent Labs  09/06/13 0636 09/08/13 0520  WBC 12.7* 10.9*  HGB 9.3* 9.3*  PLT 262 311  CREATININE 1.16* 1.44*   Estimated Creatinine Clearance: 33.9 ml/min (by C-G formula based on Cr of 1.44). No results found for this basename: VANCOTROUGH, Corlis Leak, VANCORANDOM, GENTTROUGH, GENTPEAK, GENTRANDOM, TOBRATROUGH, TOBRAPEAK, TOBRARND, AMIKACINPEAK, AMIKACINTROU, AMIKACIN,  in the last 72 hours   Microbiology: Recent Results (from the past 720 hour(s))  URINE CULTURE     Status: None   Collection Time    09/01/13  8:30 PM      Result Value Ref Range Status   Specimen Description URINE, CLEAN CATCH   Final   Special Requests Normal   Final   Culture  Setup Time     Final   Value: 09/02/2013 01:01     Performed at [facility]     Final   Value: 50,000 COLONIES/ML     Performed at [facility]   Culture     Final   Value: Multiple bacterial morphotypes present, none predominant. Suggest appropriate recollection if clinically indicated.     Performed at [facility]   Report Status 09/02/2013 FINAL   Final  ANAEROBIC CULTURE     Status: None   Collection Time    09/02/13 12:45 AM      Result Value Ref Range Status   Specimen Description ABSCESS PELVIC   Final   Special Requests NONE   Final   Gram Stain     Final   Value: MODERATE WBC PRESENT,BOTH PMN AND MONONUCLEAR     NO SQUAMOUS  EPITHELIAL CELLS SEEN     ABUNDANT GRAM POSITIVE COCCI     IN PAIRS RARE GRAM NEGATIVE RODS     Performed at [facility]   Culture     Final   Value: NO ANAEROBES ISOLATED     Performed at [facility]   Report Status 09/07/2013 FINAL   Final  CULTURE, ROUTINE-ABSCESS     Status: None   Collection Time    09/02/13 12:45 AM      Result Value Ref Range Status   Specimen Description ABSCESS PELVIC   Final   Special Requests NONE   Final   Gram Stain     Final   Value: MODERATE WBC PRESENT,BOTH PMN AND MONONUCLEAR     NO SQUAMOUS EPITHELIAL CELLS SEEN     ABUNDANT GRAM POSITIVE COCCI     IN PAIRS RARE GRAM NEGATIVE RODS     Performed at [facility]   Culture     Final   Value: FEW ESCHERICHIA COLI     Performed at [facility]   Report Status 09/04/2013 FINAL   Final   Organism ID, Bacteria ESCHERICHIA COLI   Final  MRSA PCR SCREENING     Status: None   Collection Time    09/02/13  5:07 AM  Result Value Ref Range Status   MRSA by PCR NEGATIVE  NEGATIVE Final   Comment:            The GeneXpert MRSA Assay (FDA     approved for NASAL specimens     only), is one component of a     comprehensive MRSA colonization     surveillance program. It is not     intended to diagnose MRSA     infection nor to guide or     monitor treatment for     MRSA infections.    Medical History: Past Medical History  Diagnosis Date  . Glaucoma   . Diabetes mellitus   . Hypertension   . Hyperlipidemia   . Arthritis     hands, back  . History of shingles   . Cataracts, bilateral   . A-fib   . Post herpetic neuralgia 12/19/2012  . Chronic lower back pain 12/19/2012  . Arthritis, lumbar spine 12/19/2012   Assessment: 38  YOF presenting 9/1 with abdominal pain with CT revealing perforated diverticulitis with multiple loculated abscesses. She is s/p Harman colectomy 9/2. Intra-op cultures reveal pan-sensitive E.coli.  She has been on zosyn, SCr trending up  - so Pharmacy asked to dose zosyn for renal function.     9/3>>zosyn >>  Tmax: 99.4 WBCs: 10.9 Renal: SCr = 1.44  9/1 urine: 50K multiple morphologies 9/2 pelvic abscess: e.coli (pan-sensitive) Drug level / dose changes info:  Goal of Therapy:  Dose for indication and patient parameters  Plan:  Day #6 zosyn  Zosyn 3.375gm IV q8h over 4h infusion remains appropriate at this time  Consider narrowing antibiotics per intra-op cultures (pan-sensitive E.coli), if wish to continue to have anaerobic coverage, suggest Unasyn 3gm IV q8h  Doreene Eland, PharmD, BCPS.   Pager: [phone]` 09/08/2013,8:53 AM

## 2013-09-08 NOTE — Progress Notes (Signed)
Physical Therapy Treatment Patient Details Name: Kimberly Castillo MRN: [id number] DOB: [birth year] Today's Date: 09/08/2013    History of Present Illness 80 yo female s/p Hartmann's resection 09/01/13. Hx of glaucoma, DM, HTN, A fib, postherpetic neuralgia, chronic LBP, shingles.     PT Comments    Progressing with mobility and activity tolerance.   Follow Up Recommendations  Home health PT;Supervision/Assistance - 24 hour     Equipment Recommendations  None recommended by PT    Recommendations for Other Services OT consult     Precautions / Restrictions Precautions Precautions: Fall Precaution Comments: abd surgery; drain R flank Restrictions Weight Bearing Restrictions: No    Mobility  Bed Mobility               General bed mobility comments: pt sitting in recliner  Transfers Overall transfer level: Needs assistance Equipment used: Rolling walker (2 wheeled)   Sit to Stand: Min assist Stand pivot transfers: Min assist       General transfer comment: VCS safety, hand placement. Assist to rise, stabilize, control descent  Ambulation/Gait Ambulation/Gait assistance: Min assist Ambulation Distance (Feet): 500 Feet Assistive device: Rolling walker (2 wheeled) Gait Pattern/deviations: Step-through pattern;Decreased stride length     General Gait Details: slow gait speed. assist to stabilize intermittently however pt was Min guard assist for the majority of the distance. tolerated distance well-pt actually requested to walk farther.   Stairs            Wheelchair Mobility    Modified Rankin (Stroke Patients Only)       Balance                                    Cognition Arousal/Alertness: Awake/alert Behavior During Therapy: WFL for tasks assessed/performed Overall Cognitive Status: Within Functional Limits for tasks assessed                      Exercises      General Comments        Pertinent Vitals/Pain Pain  Assessment: 0-10 Pain Score: 5  Pain Location: R lower abdomen Pain Intervention(s): Monitored during session    Home Living                      Prior Function            PT Goals (current goals can now be found in the care plan section) Progress towards PT goals: Progressing toward goals    Frequency  Min 3X/week    PT Plan Current plan remains appropriate    Co-evaluation             End of Session   Activity Tolerance: Patient tolerated treatment well Patient left: in chair;with call bell/phone within reach;with nursing/sitter in room     Time: 1421-1445 PT Time Calculation (min): 24 min  Charges:  $Gait Training: 23-37 mins                    G Codes:      Weston Anna, MPT Pager: [phone]

## 2013-09-08 NOTE — Progress Notes (Signed)
[facility] Progress Note  6 Days Post-Op  Subjective: Pt doing well, having little pain.  Ambulating in her room, but not in the halls yet.  No N/V, tolerating fulls.  Ostomy putting out liquid stool and flatus.  WOC following.    Objective: Vital signs in last 24 hours: Temp:  [98.2 F (36.8 C)-99.4 F (37.4 C)] 98.4 F (36.9 C) (09/08 0550) Pulse Rate:  [62-83] 62 (09/08 0550) Resp:  [18] 18 (09/08 0550) BP: (148-158)/(66-67) 158/66 mmHg (09/08 0550) SpO2:  [93 %-100 %] 100 % (09/08 0550) Last BM Date: 09/03/13  Intake/Output from previous day: 09/07 0701 - 09/08 0700 In: 1800 [P.O.:1800] Out: 3305 [Urine:2950; Drains:205; Stool:150] Intake/Output this shift:    PE: Gen:  Alert, NAD, pleasant Abd: Soft, mild tenderness, ND, +BS, no HSM, midline wound with wound vac in place, wound vac with serosanguinous drainage, JP drain with minimal serosanguinous drainage RLQ   Lab Results:   Recent Labs  09/06/13 0636 09/08/13 0520  WBC 12.7* 10.9*  HGB 9.3* 9.3*  HCT 28.1* 28.1*  PLT 262 311   BMET  Recent Labs  09/06/13 0636 09/08/13 0520  NA 138 136*  K 4.1 4.7  CL 105 102  CO2 24 24  GLUCOSE 139* 135*  BUN 18 10  CREATININE 1.16* 1.44*  CALCIUM 9.1 9.1   PT/INR No results found for this basename: LABPROT, INR,  in the last 72 hours CMP     Component Value Date/Time   NA 136* 09/08/2013 0520   K 4.7 09/08/2013 0520   CL 102 09/08/2013 0520   CO2 24 09/08/2013 0520   GLUCOSE 135* 09/08/2013 0520   BUN 10 09/08/2013 0520   CREATININE 1.44* 09/08/2013 0520   CALCIUM 9.1 09/08/2013 0520   PROT 7.7 09/01/2013 1931   ALBUMIN 2.8* 09/01/2013 1931   AST 11 09/01/2013 1931   ALT 11 09/01/2013 1931   ALKPHOS 77 09/01/2013 1931   BILITOT 0.6 09/01/2013 1931   GFRNONAA 34* 09/08/2013 0520   GFRAA 39* 09/08/2013 0520   Lipase     Component Value Date/Time   LIPASE 17 09/01/2013 1931       Studies/Results: No results found.  Anti-infectives: Anti-infectives   Start      Dose/Rate Route Frequency Ordered Stop   09/02/13 2359  piperacillin-tazobactam (ZOSYN) IVPB 3.375 g     3.375 g 12.5 mL/hr over 240 Minutes Intravenous Every 8 hours 09/01/13 2347     09/01/13 2245  cefTRIAXone (ROCEPHIN) 1 g in dextrose 5 % 50 mL IVPB     1 g 100 mL/hr over 30 Minutes Intravenous  Once 09/01/13 2236 09/01/13 2321       Assessment/Plan 1. POD #6 Status post Hartmann's resection for perforated diverticulitis 09/02/13-on IV Zosyn; still with some ileus.  2. Wound and ostomy care - VAC placed yesterday.  3. DM-cbgs 110-163 on SSI  4. HTN / PAC's / PVC's - stable with medications.  5. AKI-jumped to 1.44 from 1.16   Plan:  1.  Change VAC Tu, Th, Sa.  Dressing change went well today.  Ostomy care 2.  Check lab tomorrow 3.  Tolerating fulls, switch to soft diet 4.  Ambulate and IS 5.  SCD's and heparin 6.  IVF to 74mL/hr - gentle hydration, good urine output despite elevation in Cr up to 1.44 today 7.  Ask pharmacy to renal dose the zosyn Day #6/7 or until WBC normal (10.9 today) - maybe d/c tomorrow? 8.  PO  pain meds 9.  24 hr supervision at discharge per PT/OT 10.  Consider removing JP drain tomorrow if output is improved 258mL/24hr    LOS: 7 days    DORT, Lalaine Overstreet 09/08/2013, 7:46 AM Pager: [phone]

## 2013-09-09 LAB — BASIC METABOLIC PANEL
Anion gap: 9 (ref 5–15)
BUN: 9 mg/dL (ref 6–23)
CALCIUM: 9.2 mg/dL (ref 8.4–10.5)
CO2: 24 mEq/L (ref 19–32)
Chloride: 103 mEq/L (ref 96–112)
Creatinine, Ser: 1.39 mg/dL — ABNORMAL HIGH (ref 0.50–1.10)
GFR calc Af Amer: 41 mL/min — ABNORMAL LOW (ref >90)
GFR, EST NON AFRICAN AMERICAN: 35 mL/min — AB (ref >90)
Glucose, Bld: 97 mg/dL (ref 70–99)
Potassium: 4.6 mEq/L (ref 3.7–5.3)
Sodium: 136 mEq/L — ABNORMAL LOW (ref 137–147)

## 2013-09-09 LAB — GLUCOSE, CAPILLARY
GLUCOSE-CAPILLARY: 117 mg/dL — AB (ref 70–99)
GLUCOSE-CAPILLARY: 123 mg/dL — AB (ref 70–99)
GLUCOSE-CAPILLARY: 129 mg/dL — AB (ref 70–99)
Glucose-Capillary: 127 mg/dL — ABNORMAL HIGH (ref 70–99)
Glucose-Capillary: 132 mg/dL — ABNORMAL HIGH (ref 70–99)

## 2013-09-09 LAB — CBC
HCT: 28 % — ABNORMAL LOW (ref 36.0–46.0)
Hemoglobin: 9.1 g/dL — ABNORMAL LOW (ref 12.0–15.0)
MCH: 31.5 pg (ref 26.0–34.0)
MCHC: 32.5 g/dL (ref 30.0–36.0)
MCV: 96.9 fL (ref 78.0–100.0)
PLATELETS: 333 10*3/uL (ref 150–400)
RBC: 2.89 MIL/uL — ABNORMAL LOW (ref 3.87–5.11)
RDW: 14 % (ref 11.5–15.5)
WBC: 8.5 10*3/uL (ref 4.0–10.5)

## 2013-09-09 MED ORDER — PANTOPRAZOLE SODIUM 40 MG PO TBEC
40.0000 mg | DELAYED_RELEASE_TABLET | Freq: Every day | ORAL | Status: DC
  Administered 2013-09-10: 40 mg via ORAL
  Filled 2013-09-09: qty 1

## 2013-09-09 NOTE — Progress Notes (Signed)
Patient interviewed and examined, agree with PA note above.  Edward Jolly MD, FACS  09/09/2013 5:25 PM

## 2013-09-09 NOTE — Care Management Note (Signed)
    Page 1 of 2   09/09/2013     1:26:26 PM CARE MANAGEMENT NOTE 09/09/2013  Patient:  Kimberly Castillo,Kimberly Castillo   Account Number:  [id number]  Date Initiated:  09/02/2013  Documentation initiated by:  DAVIS,RHONDA  Subjective/Objective Assessment:   Patient to ICU from OR this AM.  Underwent Hartmann's resection for perforated diverticular disease with peritonitis.  Awake, confused.  Significant hypertension. Complains of pain.     Action/Plan:   home when stable   Anticipated DC Date:  09/05/2013   Anticipated DC Plan:  [place] referral  NA      DC Planning Services  CM consult      [facility] Choice  [place]   Choice offered to / List presented to:  C-1 Patient   DME arranged  VAC      DME agency  [employer]     HH arranged  HH-1 RN      [place].   Status of service:  Completed, signed off Medicare Important Message given?  NA - LOS <3 / Initial given by admissions (If response is "NO", the following Medicare IM given date fields will be blank) Date Medicare IM given:  09/09/2013 Medicare IM given by:  [facility] Date Additional Medicare IM given:   Additional Medicare IM given by:    Discharge Disposition:  [place]  Per UR Regulation:  Reviewed for med. necessity/level of care/duration of stay  If discussed at [place] of Stay Meetings, dates discussed:    Comments:  09/09/13 10:30 CM called [employer] rep, Rickie to get status of VAC and was informed pt's insurance  was not  approved bc numbers may be inaccurate.  CM met with pt inroom who explained she has had an ongoing problem with this as her birthdate is 08-10-2033 and her insurance inaccurately keyed it as [birth year].  CM asked if she had card and pt stated not at the hospital.  CM pulled copy from [facility] and pt verified it's accuracy.  Dianne from [facility] requested I fax card but when I explained the birthdate problem, Levander Campion was able to  get authorization with other birthdate.  Rickie (local rep) is aware and VAC will arrive this afternoon to the pt's room.  Pt is set up with [facility] for HHRN/PT/OT.  No other CM needs were communicated. Mariane Masters, BSN, CM [phone].   Velva Harman, RN, BSN, CCM:

## 2013-09-09 NOTE — Progress Notes (Signed)
Physical Therapy Treatment Patient Details Name: Lavender Coone MRN: [id number] DOB: 1933-11-04 Today's Date: 09/09/2013    History of Present Illness 80 yo female s/p Hartmann's resection 09/01/13. Hx of glaucoma, DM, HTN, A fib, postherpetic neuralgia, chronic LBP, shingles.     PT Comments    Assisted pt OOB to amb to BR.  Assisted in BR then amb in hallway a great distance.    Follow Up Recommendations        Equipment Recommendations       Recommendations for Other Services       Precautions / Restrictions Precautions Precautions: Fall Precaution Comments: abd surgery; drain R flank.VAC Restrictions Weight Bearing Restrictions: No    Mobility  Bed Mobility Overal bed mobility: Needs Assistance Bed Mobility: Supine to Sit;Sit to Supine     Supine to sit: Mod assist Sit to supine: Mod assist   General bed mobility comments: assist with upper body OOB then assist with LE back into bed  Transfers Overall transfer level: Needs assistance Equipment used: Rolling walker (2 wheeled) Transfers: Sit to/from Stand Sit to Stand: Min assist         General transfer comment: VCS safety, hand placement. Assist to rise, stabilize, control descent.  Asssited off bed and toilet.  Ambulation/Gait Ambulation/Gait assistance: Min guard;Min assist Ambulation Distance (Feet): 500 Feet Assistive device: Rolling walker (2 wheeled) Gait Pattern/deviations: Step-through pattern;Decreased stride length Gait velocity: decreased   General Gait Details: slow but steady alternating gait with good safety cognition   Stairs            Wheelchair Mobility    Modified Rankin (Stroke Patients Only)       Balance                                    Cognition                            Exercises      General Comments        Pertinent Vitals/Pain      Home Living                      Prior Function            PT Goals (current  goals can now be found in the care plan section)      Frequency       PT Plan      Co-evaluation             End of Session           Time: [id number] PT Time Calculation (min): 25 min  Charges:  $Gait Training: 8-22 mins $Therapeutic Activity: 8-22 mins                    G Codes:      Rica Koyanagi  PTA [facility]  Acute  Rehab Pager      [phone]

## 2013-09-09 NOTE — Progress Notes (Signed)
Pharmacy - Brief Note  This patient is receiving pantoprazole. Based on criteria approved by the Pharmacy and Therapeutics Committee, this medication is being converted to the equivalent oral dose form. These criteria include:   . The patient is eating (either orally or per tube) and/or has been taking other orally administered medications for at least 24 hours.  . This patient has no evidence of active gastrointestinal bleeding or impaired GI absorption (gastrectomy, short bowel, patient on TNA or NPO).   If you have questions about this conversion, please contact the pharmacy department (ext [phone])  Doreene Eland, PharmD, BCPS.   Pager: [phone]  09/09/2013 10:57 AM

## 2013-09-09 NOTE — Progress Notes (Signed)
[facility] Progress Note  7 Days Post-Op  Subjective: Pt doing well, having little pain. Ambulating in her room, and through halls. No N/V, tolerating solid food. Ostomy putting out liquid stool and flatus. WOC following. Daughter at bedside.   Objective: Vital signs in last 24 hours: Temp:  [97.9 F (36.6 C)-98.1 F (36.7 C)] 98.1 F (36.7 C) (09/09 0504) Pulse Rate:  [62-76] 71 (09/09 0504) Resp:  [18-20] 20 (09/09 0504) BP: (138-178)/(58-74) 178/71 mmHg (09/09 0504) SpO2:  [97 %-100 %] 97 % (09/09 0504) Last BM Date: 09/03/13  Intake/Output from previous day: 09/08 0701 - 09/09 0700 In: 2187.5 [P.O.:1260; I.V.:927.5] Out: 2745 [Urine:2500; Drains:145; Stool:100] Intake/Output this shift:    PE: Gen:  Alert, NAD, pleasant Abd: Soft, mild tenderness, ND, +BS, no HSM, midline wound with wound vac in place, wound vac with serosanguinous drainage, JP drain with serous drainage RLQ (to be d/c'ed)   Lab Results:   Recent Labs  09/08/13 0520 09/09/13 0453  WBC 10.9* 8.5  HGB 9.3* 9.1*  HCT 28.1* 28.0*  PLT 311 333   BMET  Recent Labs  09/08/13 0520 09/09/13 0453  NA 136* 136*  K 4.7 4.6  CL 102 103  CO2 24 24  GLUCOSE 135* 97  BUN 10 9  CREATININE 1.44* 1.39*  CALCIUM 9.1 9.2   PT/INR No results found for this basename: LABPROT, INR,  in the last 72 hours CMP     Component Value Date/Time   NA 136* 09/09/2013 0453   K 4.6 09/09/2013 0453   CL 103 09/09/2013 0453   CO2 24 09/09/2013 0453   GLUCOSE 97 09/09/2013 0453   BUN 9 09/09/2013 0453   CREATININE 1.39* 09/09/2013 0453   CALCIUM 9.2 09/09/2013 0453   PROT 7.7 09/01/2013 1931   ALBUMIN 2.8* 09/01/2013 1931   AST 11 09/01/2013 1931   ALT 11 09/01/2013 1931   ALKPHOS 77 09/01/2013 1931   BILITOT 0.6 09/01/2013 1931   GFRNONAA 35* 09/09/2013 0453   GFRAA 41* 09/09/2013 0453   Lipase     Component Value Date/Time   LIPASE 17 09/01/2013 1931       Studies/Results: No results  found.  Anti-infectives: Anti-infectives   Start     Dose/Rate Route Frequency Ordered Stop   09/02/13 2359  piperacillin-tazobactam (ZOSYN) IVPB 3.375 g     3.375 g 12.5 mL/hr over 240 Minutes Intravenous Every 8 hours 09/01/13 2347     09/01/13 2245  cefTRIAXone (ROCEPHIN) 1 g in dextrose 5 % 50 mL IVPB     1 g 100 mL/hr over 30 Minutes Intravenous  Once 09/01/13 2236 09/01/13 2321       Assessment/Plan 1. POD #7 Status post Hartmann's resection for perforated diverticulitis 09/02/13-on IV Zosyn; still with some ileus.  2. Wound and ostomy care - VAC placed yesterday.  3. DM-cbgs 105-149 on SSI  4. HTN / PAC's / PVC's - stable with medications.  5. AKI-improved to 1.39 6. Leukocytosis - resolved  Plan:  1. Change VAC Tu, Th, Sa. Dressing change went well yesterday. Continue Vac at d/c.  Ostomy care  2. D/c JP drain today output at 158mL/24hr 3. Tolerating fulls, switch to soft diet  4. Ambulate and IS  5. SCD's and heparin  6. IVF to 68mL/hr - gentle hydration, good urine output, Cr. Improving down to 1.39, d/c foley 7. Ask pharmacy to renal dose the zosyn Day #7/7  - d/c today after 7th day dose at 1600  8. PO pain meds  9. 24 hr supervision at discharge per PT/OT with [facility] PT 10.  Tentative plan for d/c tomorrow after vac change if renal function continues to improve 11.  Talked to Daughter at bedside [facility]     LOS: 8 days    Kimberly Castillo 09/09/2013, 7:22 AM Pager: [phone]

## 2013-09-10 ENCOUNTER — Telehealth: Payer: Self-pay | Admitting: Internal Medicine

## 2013-09-10 ENCOUNTER — Other Ambulatory Visit: Payer: Self-pay | Admitting: Orthopedic Surgery

## 2013-09-10 DIAGNOSIS — Z7189 Other specified counseling: Secondary | ICD-10-CM

## 2013-09-10 DIAGNOSIS — M5136 Other intervertebral disc degeneration, lumbar region: Secondary | ICD-10-CM

## 2013-09-10 LAB — GLUCOSE, CAPILLARY
Glucose-Capillary: 107 mg/dL — ABNORMAL HIGH (ref 70–99)
Glucose-Capillary: 112 mg/dL — ABNORMAL HIGH (ref 70–99)
Glucose-Capillary: 152 mg/dL — ABNORMAL HIGH (ref 70–99)

## 2013-09-10 LAB — BASIC METABOLIC PANEL
Anion gap: 9 (ref 5–15)
BUN: 9 mg/dL (ref 6–23)
CALCIUM: 9.1 mg/dL (ref 8.4–10.5)
CHLORIDE: 104 meq/L (ref 96–112)
CO2: 22 mEq/L (ref 19–32)
CREATININE: 1.29 mg/dL — AB (ref 0.50–1.10)
GFR calc Af Amer: 45 mL/min — ABNORMAL LOW (ref >90)
GFR calc non Af Amer: 39 mL/min — ABNORMAL LOW (ref >90)
Glucose, Bld: 112 mg/dL — ABNORMAL HIGH (ref 70–99)
Potassium: 4.3 mEq/L (ref 3.7–5.3)
Sodium: 135 mEq/L — ABNORMAL LOW (ref 137–147)

## 2013-09-10 MED ORDER — HYDROCODONE-ACETAMINOPHEN 5-325 MG PO TABS: 1.0000 | ORAL_TABLET | Freq: Four times a day (QID) | ORAL | Status: DC | PRN

## 2013-09-10 NOTE — Telephone Encounter (Signed)
Ok for verbal 

## 2013-09-10 NOTE — Discharge Instructions (Signed)
[facility]      [facility] [phone]  OPEN ABDOMINAL SURGERY: POST OP INSTRUCTIONS  Always review your discharge instruction sheet given to you by the facility where your surgery was performed.  IF YOU HAVE DISABILITY OR FAMILY LEAVE FORMS, YOU MUST BRING THEM TO THE OFFICE FOR PROCESSING.  PLEASE DO NOT GIVE THEM TO YOUR DOCTOR.  1. A prescription for pain medication may be given to you upon discharge.  Take your pain medication as prescribed, if needed.  If narcotic pain medicine is not needed, then you may take acetaminophen (Tylenol) or ibuprofen (Advil) as needed. 2. Take your usually prescribed medications unless otherwise directed. 3. If you need a refill on your pain medication, please contact your pharmacy. They will contact our office to request authorization.  Prescriptions will not be filled after 5pm or on week-ends. 4. You should follow a light diet the first few days after arrival home, such as soup and crackers, pudding, etc.unless your doctor has advised otherwise. A high-fiber, low fat diet can be resumed as tolerated.   Be sure to include lots of fluids daily. Most patients will experience some swelling and bruising on the chest and neck area.  Ice packs will help.  Swelling and bruising can take several days to resolve 5. Most patients will experience some swelling and bruising in the area of the incision. Ice pack will help. Swelling and bruising can take several days to resolve..  6. It is common to experience some constipation if taking pain medication after surgery.  Increasing fluid intake and taking a stool softener will usually help or prevent this problem from occurring.  A mild laxative (Milk of Magnesia or Miralax) should be taken according to package directions if there are no bowel movements after 48 hours. 7.  You may have steri-strips (small skin tapes) in place directly over the incision.  These strips should be left on the skin for 7-10 days.  If your  surgeon used skin glue on the incision, you may shower in 24 hours.  The glue will flake off over the next 2-3 weeks.  Any sutures or staples will be removed at the office during your follow-up visit. You may find that a light gauze bandage over your incision may keep your staples from being rubbed or pulled. You may shower and replace the bandage daily. 8. ACTIVITIES:  You may resume regular (light) daily activities beginning the next day--such as daily self-care, walking, climbing stairs--gradually increasing activities as tolerated.  You may have sexual intercourse when it is comfortable.  Refrain from any heavy lifting or straining until approved by your doctor. a. You may drive when you no longer are taking prescription pain medication, you can comfortably wear a seatbelt, and you can safely maneuver your car and apply brakes b. Return to Work: ___________________________________ 9. You should see your doctor in the office for a follow-up appointment approximately two weeks after your surgery.  Make sure that you call for this appointment within a day or two after you arrive home to insure a convenient appointment time. OTHER INSTRUCTIONS:  _____________________________________________________________ _____________________________________________________________  WHEN TO CALL YOUR DOCTOR: 1. Fever over 101.0 2. Inability to urinate 3. Nausea and/or vomiting 4. Extreme swelling or bruising 5. Continued bleeding from incision. 6. Increased pain, redness, or drainage from the incision. 7. Difficulty swallowing or breathing 8. Muscle cramping or spasms. 9. Numbness or tingling in hands or feet or around lips.  The clinic staff is available to   answer your questions during regular business hours.  Please don't hesitate to call and ask to speak to one of the nurses if you have concerns.  For further questions, please visit [email or web address]   

## 2013-09-10 NOTE — Consult Note (Signed)
WOC ostomy follow up Stoma type/location: LLQ Colostomy Stomal assessment/size: 1 and 3/8 inches Peristomal assessment: Skin barrier ring applied to clean, intact skin Treatment options for stomal/peristomal skin: See above. Output soft brown stool Ostomy pouching: 1pc. Cut-to-fit pouching system with skin barrier ring Education provided: Patient observed pouch change procedure:  Removing old pouch, sizing stoma, cleansing skin, preparing new pouch and placing new pouch, but she does not stay focussed during the session and has to repeatedly be brought back to focus.  Is going home with extended family all in close proximity, but no one was available/willing to come in today for teaching session.  Patient has [facility] services ordered, but I explain to her today that she will need to empty her pouch at least two more times today and that [facility] will not be coming until tomorrow.  Patient is not concerned  And states that "it will work out". Enrolled patient in [place] Start Discharge program: Yes Patient is being discharged today to home and the care of her family with follow-p by her surgeons. Maudie Flakes, MSN, RN, [place], [place], [place] [phone]) Extended visit = 1 hour

## 2013-09-10 NOTE — Telephone Encounter (Signed)
I think they need a referral for Home Health for this patient.

## 2013-09-10 NOTE — Discharge Summary (Signed)
[facility] Discharge Summary   Patient ID: Kimberly Castillo MRN: [id number] DOB/AGE: [birth year] 80 y.o.  Admit date: 09/01/2013 Discharge date: 09/10/2013  Admitting Diagnosis: Diverticulitis of colon with perforation HTN HLD DM type 2 GERD  Discharge Diagnosis Patient Active Problem List   Diagnosis Date Noted  . Diverticulitis of colon with perforation s/p colectomy/ostomy 09/02/2013 09/02/2013  . Post herpetic neuralgia 12/19/2012  . Arthritis, lumbar spine 12/19/2012  . Glaucoma   . A-fib   . GERD (gastroesophageal reflux disease) 10/06/2012  . DM type 2 (diabetes mellitus, type 2) 09/22/2012  . Hypertension 09/22/2012  . Hyperlipemia 09/22/2012    Consultants None  Imaging: No results found.  Procedures Dr. Excell Seltzer (09/02/13) - Hartmans colectomy  Hospital Course:  80 year old female who was in her usual state of health until the day before yesterday when she began to develop a gradual onset of pain across her lower abdomen and somewhat up to her right mid abdomen. This initially was not severe. She was constipated at that time. She happened to see her orthopedic surgeon at that time and a laxative as prescribed. She took it and had a firm bowel movement followed by some diarrhea yesterday and then during the day yesterday and through the night and today she has developed steadily worsening abdominal pain which is now very severe. She describes pain across her lower abdomen and radiating up into her upper abdomen particularly on the right side. She has had nausea without vomiting. Has noted fever, no chills. The pain is much worse with any motion. It has not been relieved with morphine in the emergency department. She denies previous significant abdominal or GI complaints.  Workup showed perforated diverticulitis with abscess and free air with peritonitis.  Patient was admitted and underwent emergent procedure listed above.  Tolerated procedure well and was  transferred to the floor.  We awaited the return of bowel function.  NG was removed on POD #1 and she was started on clears.  Diet was advanced as tolerated.  She had some trouble with renal failure but with good urine output.  We asked pharmacy to help [place] dose her Zosyn renally.  She completed a 7 day course of Zosyn.  Her WBC was normal on POD #7.  Her creatinine improved and her foley was removed.  Creatinine is now down to 1.29.  On POD #8, the patient was voiding well, tolerating diet, ambulating well, pain well controlled, vital signs stable, incisions c/d/i and felt stable for discharge home.  Patient will follow up in our office in 3 weeks and knows to call with questions or concerns.  Physical Exam: General:  Alert, NAD, pleasant, comfortable Abd:  Soft, ND, mild tenderness, wound vac in place with serosanguinous drainage, JP drain removed, ostomy patent with good stool output     Medication List    STOP taking these medications       HYDROcodone-acetaminophen 10-325 MG per tablet  Commonly known as:  NORCO  Replaced by:  HYDROcodone-acetaminophen 5-325 MG per tablet      TAKE these medications       azelastine 0.05 % ophthalmic solution  Commonly known as:  OPTIVAR  Place 1 drop into both eyes 2 (two) times daily.     HYDROcodone-acetaminophen 5-325 MG per tablet  Commonly known as:  NORCO/VICODIN  Take 1-2 tablets by mouth every 6 (six) hours as needed for moderate pain or severe pain.     hydrOXYzine 25 MG tablet  Commonly known as:  ATARAX/VISTARIL  Take 25 mg by mouth 3 (three) times daily as needed for itching.     latanoprost 0.005 % ophthalmic solution  Commonly known as:  XALATAN  Place 1 drop into both eyes at bedtime.     levobunolol 0.5 % ophthalmic solution  Commonly known as:  BETAGAN  Place 1 drop into both eyes 2 (two) times daily.     losartan-hydrochlorothiazide 100-25 MG per tablet  Commonly known as:  HYZAAR  Take 1 tablet by mouth daily.      metFORMIN 500 MG tablet  Commonly known as:  GLUCOPHAGE  Take 1 tablet (500 mg total) by mouth 2 (two) times daily with a meal.     nortriptyline 10 MG capsule  Commonly known as:  PAMELOR  1-3 tabs by mouth at bedtime for Headache prevention     pantoprazole 40 MG tablet  Commonly known as:  PROTONIX  Take 1 tablet (40 mg total) by mouth daily.     potassium chloride SA 20 MEQ tablet  Commonly known as:  K-DUR,KLOR-CON  Take 1 tablet (20 mEq total) by mouth daily.     simvastatin 40 MG tablet  Commonly known as:  ZOCOR  Take 1 tablet (40 mg total) by mouth at bedtime.     valACYclovir 500 MG tablet  Commonly known as:  VALTREX  Take 1 tablet (500 mg total) by mouth 2 (two) times daily.     zolpidem 5 MG tablet  Commonly known as:  AMBIEN  Take 5 mg by mouth at bedtime as needed. sleep         Follow-up Information   Follow up with [place]. (RN for wound and ostomy care)    Contact information:   [address] [place] [address] [phone]       Follow up with Cathlean Cower, MD.   Specialties:  Internal Medicine, Radiology   Contact information:   [place] [address] [phone]       Follow up with Edward Jolly, MD. Schedule an appointment as soon as possible for a visit in 3 weeks. (For post-operation check, please call the office to find out the time and date of your appointment)    Specialty:  General Surgery   Contact information:   [place] [address] [phone]       Signed: Excell Seltzer [facility] [phone]  09/10/2013, 9:40 AM

## 2013-09-10 NOTE — Plan of Care (Signed)
Problem: Discharge Progression Outcomes Goal: Tubes and drains discontinued if indicated Outcome: Completed/Met Date Met:  09/10/13 Patient to discharge with vac drain

## 2013-09-10 NOTE — Progress Notes (Signed)
Patient wound vac changed.  Machine switched to the [place] wound vac.

## 2013-09-10 NOTE — Progress Notes (Addendum)
Discharge instructions given to patient and son Kimberly Castillo.  Prescription given to Kimberly Castillo son.  Discussed that they need to contact [facility] at discharge.  Patient vac dsg supplies sent  Home.  Patient daughter Kimberly Castillo given direction on emptying ostomy.  Return demo from daughter performed.  Explained she should empty it when 1/4 full to prevent ostomy from dislodging.  Discharged via wheelchair

## 2013-09-10 NOTE — Progress Notes (Signed)
Came to visit patient to explain and offer [facility] services. Consents obtained. She will receive post hospital discharge calls and will be evaluated for monthly home visits. Explained [facility] Care Management will not interfere with the home health services that will be provided by [facility]. Left [facility] Management packet at bedside. Appreciative of visit. Will alert inpatient RNCM that [facility] will follow post discharge.  Kimberly Rolling, MSN-RN,BSN- [facility] [phone]

## 2013-09-10 NOTE — Discharge Summary (Signed)
Patient interviewed and examined, agree with PA note above.  Edward Jolly MD, FACS  09/10/2013 2:33 PM

## 2013-09-10 NOTE — Telephone Encounter (Signed)
Pt's son, Jeneen Rinks, call request order for wound and ostomy care [facility]. Pt was in the hospital and just got out today. Please advise.

## 2013-09-11 ENCOUNTER — Telehealth: Payer: Self-pay | Admitting: *Deleted

## 2013-09-11 LAB — GLUCOSE, CAPILLARY: GLUCOSE-CAPILLARY: 106 mg/dL — AB (ref 70–99)

## 2013-09-11 NOTE — Telephone Encounter (Signed)
The family was requesting an order to [facility] for the ostomy and wound care.

## 2013-09-11 NOTE — Telephone Encounter (Signed)
Transition Care Management Follow-up Telephone Call  How have you been since you were released from the hospital? Pt stated she feeling ok still have a little pain   Do you understand why you were in the hospital? YES, pt stated she understood why she was admitted  Do you understand the discharge instrcutions? YES, we both went over the discharge summary  Items Reviewed:  Medications reviewed: YES, medication was reviewed clarified hydrocodone was change to a lower dose, and not stop  Allergies reviewed: YES, no changes  Dietary changes reviewed: YES Referrals reviewed: YES, pt stated [facility] has contacted her will be coming out today  Functional Questionnaire:   Activities of Daily Living (ADLs):   She states they are independent in the following: no assistant needed States they require assistance with the following: no assistance is needed she stated her daughter is there to help her   Any transportation issues/concerns?: NO   Any patient concerns? Pt didn't have any concerns   Confirmed importance and date/time of follow-up visits scheduled: YES, made TCM appt 09/17/13   Confirmed with patient if condition begins to worsen call PCP or go to the ER.  Patient was given the Call-a-Nurse line [phone]: YES

## 2013-09-11 NOTE — Progress Notes (Signed)
Discharge summary sent to payer through [facility]  

## 2013-09-11 NOTE — Telephone Encounter (Signed)
[facility] with [facility] on yesterday. Kimberly Castillo needing pt last A1C result. Inform sandra last check back in June @ 6.2.Marland KitchenJohny Chess

## 2013-09-17 ENCOUNTER — Ambulatory Visit (INDEPENDENT_AMBULATORY_CARE_PROVIDER_SITE_OTHER): Payer: Medicare Other | Admitting: Internal Medicine

## 2013-09-17 ENCOUNTER — Encounter: Payer: Self-pay | Admitting: Internal Medicine

## 2013-09-17 VITALS — BP 162/90 | HR 78 | Temp 99.0°F | Wt 162.0 lb

## 2013-09-17 DIAGNOSIS — K5732 Diverticulitis of large intestine without perforation or abscess without bleeding: Secondary | ICD-10-CM

## 2013-09-17 DIAGNOSIS — K5720 Diverticulitis of large intestine with perforation and abscess without bleeding: Secondary | ICD-10-CM

## 2013-09-17 DIAGNOSIS — E785 Hyperlipidemia, unspecified: Secondary | ICD-10-CM

## 2013-09-17 DIAGNOSIS — E119 Type 2 diabetes mellitus without complications: Secondary | ICD-10-CM

## 2013-09-17 DIAGNOSIS — I10 Essential (primary) hypertension: Secondary | ICD-10-CM

## 2013-09-17 NOTE — Progress Notes (Signed)
Pre visit review using our clinic review tool, if applicable. No additional management support is needed unless otherwise documented below in the visit note. 

## 2013-09-17 NOTE — Progress Notes (Signed)
Subjective:    Patient ID: Kimberly Castillo, female    DOB: 25-Jul-1933, 80 y.o.   MRN: [id number]  HPI  Here to f/u post hospn for surgury (hartman's colectomy) related to acute diverticulitis with performation.  Has done well with HH and PT.  Not yet f/u with Dr Excell Seltzer, pt did not realize she was supposed to call to find out time and date of her post op check.  Denies worsening reflux, abd pain, dysphagia, n/v, bowel change or blood. Pt denies chest pain, increased sob or doe, wheezing, orthopnea, PND, increased LE swelling, palpitations, dizziness or syncope.   Pt denies polydipsia, polyuria,  Had to stop cymbalta - too expensive. Denies worsening depressive symptoms, suicidal ideation, or panic. No current complaints.  BP at home recently < 140/90 Past Medical History  Diagnosis Date  . Glaucoma   . Diabetes mellitus   . Hypertension   . Hyperlipidemia   . Arthritis     hands, back  . History of shingles   . Cataracts, bilateral   . A-fib   . Post herpetic neuralgia 12/19/2012  . Chronic lower back pain 12/19/2012  . Arthritis, lumbar spine 12/19/2012   Past Surgical History  Procedure Laterality Date  . Incision and drainage breast abscess  1958    left  . Abdominal hysterectomy  1964    partial  . Colon resection N/A 09/02/2013    Procedure: [place] colectomy;  Surgeon: Excell Seltzer, MD;  Location: [facility] ORS;  Service: General;  Laterality: N/A;    reports that she has quit smoking. Her smoking use included Cigarettes. She started smoking about 22 years ago. She smoked 0.00 packs per day. She has never used smokeless tobacco. She reports that she does not drink alcohol or use illicit drugs. family history includes Hypertension in her maternal grandmother and mother. There is no history of Colon cancer or Stomach cancer. No Known Allergies Current Outpatient Prescriptions on File Prior to Visit  Medication Sig Dispense Refill  . azelastine (OPTIVAR) 0.05 % ophthalmic solution  Place 1 drop into both eyes 2 (two) times daily.      [name] HYDROcodone-acetaminophen (NORCO/VICODIN) 5-325 MG per tablet Take 1-2 tablets by mouth every 6 (six) hours as needed for moderate pain or severe pain.  40 tablet  0  . hydrOXYzine (ATARAX/VISTARIL) 25 MG tablet Take 25 mg by mouth 3 (three) times daily as needed for itching.      . latanoprost (XALATAN) 0.005 % ophthalmic solution Place 1 drop into both eyes at bedtime.      [name] levobunolol (BETAGAN) 0.5 % ophthalmic solution Place 1 drop into both eyes 2 (two) times daily.      [name] losartan-hydrochlorothiazide (HYZAAR) 100-25 MG per tablet Take 1 tablet by mouth daily.  90 tablet  3  . metFORMIN (GLUCOPHAGE) 500 MG tablet Take 1 tablet (500 mg total) by mouth 2 (two) times daily with a meal.  180 tablet  3  . nortriptyline (PAMELOR) 10 MG capsule 1-3 tabs by mouth at bedtime for Headache prevention  270 capsule  1  . pantoprazole (PROTONIX) 40 MG tablet Take 1 tablet (40 mg total) by mouth daily.  90 tablet  3  . potassium chloride SA (K-DUR,KLOR-CON) 20 MEQ tablet Take 1 tablet (20 mEq total) by mouth daily.  90 tablet  3  . simvastatin (ZOCOR) 40 MG tablet Take 1 tablet (40 mg total) by mouth at bedtime.  90 tablet  3  . valACYclovir (VALTREX) 500 MG  tablet Take 1 tablet (500 mg total) by mouth 2 (two) times daily.  180 tablet  3  . zolpidem (AMBIEN) 5 MG tablet Take 5 mg by mouth at bedtime as needed. sleep       No current facility-administered medications on file prior to visit.    Review of Systems  Constitutional: Negative for unusual diaphoresis or other sweats  HENT: Negative for ringing in ear Eyes: Negative for double vision or worsening visual disturbance.  Respiratory: Negative for choking and stridor.   Gastrointestinal: Negative for vomiting or other signifcant bowel change Genitourinary: Negative for hematuria or decreased urine volume.  Musculoskeletal: Negative for other MSK pain or swelling Skin: Negative for color  change and worsening wound.  Neurological: Negative for tremors and numbness other than noted  Psychiatric/Behavioral: Negative for decreased concentration or agitation other than above       Objective:   Physical Exam BP 162/90  Pulse 78  Temp(Src) 99 F (37.2 C) (Oral)  Wt 162 lb (73.483 kg)  SpO2 91% VS noted,  Constitutional: Pt appears well-developed, well-nourished.  HENT: Head: NCAT.  Right Ear: External ear normal.  Left Ear: External ear normal.  Eyes: . Pupils are equal, round, and reactive to light. Conjunctivae and EOM are normal Neck: Normal range of motion. Neck supple.  Cardiovascular: Normal rate and regular rhythm.   Pulmonary/Chest: Effort normal and breath sounds normal.  Abd:  Soft, NT, ND, + BS Neurological: Pt is alert. Not confused , motor grossly intact Skin: Skin is warm. No rash Psychiatric: Pt behavior is normal. No agitation. not depressed affect    Assessment & Plan:   BP Readings from Last 3 Encounters:  09/17/13 162/90  09/10/13 135/91  09/10/13 135/91

## 2013-09-17 NOTE — Patient Instructions (Signed)
Please continue all other medications as before, and refills have been done if requested.  Please have the pharmacy call with any other refills you may need.  Please continue your efforts at being more active, low cholesterol diet, and weight control.  You are otherwise up to date with prevention measures today.  Please keep your appointments with your specialists as you may have planned - Dr Excell Seltzer

## 2013-09-20 NOTE — Assessment & Plan Note (Signed)
stable overall by history and exam, recent data reviewed with pt, and pt to continue medical treatment as before,  to f/u any worsening symptoms or concerns Lab Results  Component Value Date   HGBA1C 6.3 06/17/2013

## 2013-09-20 NOTE — Assessment & Plan Note (Signed)
Pt plans to call and find out when her appt has been planned

## 2013-09-20 NOTE — Assessment & Plan Note (Signed)
Mild elev, likely situational, cont same med,  to f/u any worsening symptoms or concerns

## 2013-09-20 NOTE — Assessment & Plan Note (Signed)
stable overall by history and exam, recent data reviewed with pt, and pt to continue medical treatment as before,  to f/u any worsening symptoms or concerns Lab Results  Component Value Date   LDLCALC 91 06/17/2013

## 2013-09-22 ENCOUNTER — Ambulatory Visit: Payer: Medicare Other | Admitting: Internal Medicine

## 2013-09-22 DIAGNOSIS — Z0289 Encounter for other administrative examinations: Secondary | ICD-10-CM

## 2013-09-25 ENCOUNTER — Telehealth: Payer: Self-pay | Admitting: Internal Medicine

## 2013-09-25 MED ORDER — HYDROCODONE-ACETAMINOPHEN 5-325 MG PO TABS: 1.0000 | ORAL_TABLET | Freq: Four times a day (QID) | ORAL | Status: DC | PRN

## 2013-09-25 NOTE — Telephone Encounter (Signed)
Ok for repeat this time, but I am hoping this should be adequate, as I will not be able to prescribe for longer term needs (I no longer practice chronic pain treatment)

## 2013-09-25 NOTE — Telephone Encounter (Signed)
Pt informed rx is ready for pick and of referral to pain management if refill is needed.

## 2013-09-25 NOTE — Telephone Encounter (Signed)
Patient would like refill of hydrocodone.  Patient is out.

## 2013-11-04 ENCOUNTER — Telehealth: Payer: Self-pay

## 2013-11-04 MED ORDER — SIMVASTATIN 40 MG PO TABS: 40.0000 mg | ORAL_TABLET | Freq: Every day | ORAL | Status: DC

## 2013-11-04 MED ORDER — LOSARTAN POTASSIUM-HCTZ 100-25 MG PO TABS: 1.0000 | ORAL_TABLET | Freq: Every day | ORAL | Status: DC

## 2013-11-04 NOTE — Telephone Encounter (Signed)
Called patient to inquire if taking her simvastatin and losartan. Pt advise that she continues to take both medication as prescribed and will put up refills from her pharmacy.

## 2013-11-04 NOTE — Telephone Encounter (Signed)
Called patient to

## 2013-12-22 ENCOUNTER — Ambulatory Visit (INDEPENDENT_AMBULATORY_CARE_PROVIDER_SITE_OTHER): Payer: Medicare Other | Admitting: Internal Medicine

## 2013-12-22 ENCOUNTER — Encounter: Payer: Self-pay | Admitting: Internal Medicine

## 2013-12-22 ENCOUNTER — Other Ambulatory Visit: Payer: Self-pay | Admitting: Internal Medicine

## 2013-12-22 ENCOUNTER — Other Ambulatory Visit (INDEPENDENT_AMBULATORY_CARE_PROVIDER_SITE_OTHER): Payer: Medicare Other

## 2013-12-22 VITALS — BP 120/78 | HR 86 | Temp 98.4°F | Ht 62.0 in | Wt 149.2 lb

## 2013-12-22 DIAGNOSIS — F411 Generalized anxiety disorder: Secondary | ICD-10-CM

## 2013-12-22 DIAGNOSIS — B3731 Acute candidiasis of vulva and vagina: Secondary | ICD-10-CM

## 2013-12-22 DIAGNOSIS — Z0000 Encounter for general adult medical examination without abnormal findings: Secondary | ICD-10-CM

## 2013-12-22 DIAGNOSIS — R300 Dysuria: Secondary | ICD-10-CM

## 2013-12-22 DIAGNOSIS — E119 Type 2 diabetes mellitus without complications: Secondary | ICD-10-CM

## 2013-12-22 DIAGNOSIS — B373 Candidiasis of vulva and vagina: Secondary | ICD-10-CM | POA: Insufficient documentation

## 2013-12-22 DIAGNOSIS — I10 Essential (primary) hypertension: Secondary | ICD-10-CM

## 2013-12-22 HISTORY — DX: Generalized anxiety disorder: F41.1

## 2013-12-22 LAB — URINALYSIS, ROUTINE W REFLEX MICROSCOPIC
Nitrite: NEGATIVE
Specific Gravity, Urine: >=1.03 — AB (ref 1.000–1.030)
TOTAL PROTEIN, URINE-UPE24: 100 — AB
URINE GLUCOSE: NEGATIVE
Urobilinogen, UA: 0.2 (ref 0.0–1.0)
pH: 5.5 (ref 5.0–8.0)

## 2013-12-22 LAB — BASIC METABOLIC PANEL
BUN: 28 mg/dL — ABNORMAL HIGH (ref 6–23)
CO2: 27 meq/L (ref 19–32)
CREATININE: 1.6 mg/dL — AB (ref 0.4–1.2)
Calcium: 9.6 mg/dL (ref 8.4–10.5)
Chloride: 110 mEq/L (ref 96–112)
GFR: 40 mL/min — ABNORMAL LOW (ref >60.00)
Glucose, Bld: 91 mg/dL (ref 70–99)
Potassium: 3.7 mEq/L (ref 3.5–5.1)
SODIUM: 142 meq/L (ref 135–145)

## 2013-12-22 LAB — CBC WITH DIFFERENTIAL/PLATELET
BASOS ABS: 0.1 10*3/uL (ref 0.0–0.1)
BASOS PCT: 0.9 % (ref 0.0–3.0)
EOS ABS: 0.2 10*3/uL (ref 0.0–0.7)
Eosinophils Relative: 2.3 % (ref 0.0–5.0)
HCT: 34 % — ABNORMAL LOW (ref 36.0–46.0)
Hemoglobin: 10.9 g/dL — ABNORMAL LOW (ref 12.0–15.0)
LYMPHS PCT: 29.4 % (ref 12.0–46.0)
Lymphs Abs: 2.2 10*3/uL (ref 0.7–4.0)
MCHC: 32.1 g/dL (ref 30.0–36.0)
MCV: 95 fl (ref 78.0–100.0)
MONO ABS: 0.4 10*3/uL (ref 0.1–1.0)
Monocytes Relative: 5.3 % (ref 3.0–12.0)
NEUTROS ABS: 4.6 10*3/uL (ref 1.4–7.7)
Neutrophils Relative %: 62.1 % (ref 43.0–77.0)
Platelets: 279 10*3/uL (ref 150.0–400.0)
RBC: 3.58 Mil/uL — AB (ref 3.87–5.11)
RDW: 18.6 % — AB (ref 11.5–15.5)
WBC: 7.5 10*3/uL (ref 4.0–10.5)

## 2013-12-22 LAB — MICROALBUMIN / CREATININE URINE RATIO
CREATININE, U: 382.6 mg/dL
MICROALB/CREAT RATIO: 9.6 mg/g (ref 0.0–30.0)
Microalb, Ur: 36.8 mg/dL — ABNORMAL HIGH (ref 0.0–1.9)

## 2013-12-22 LAB — LIPID PANEL
CHOL/HDL RATIO: 4
Cholesterol: 168 mg/dL (ref 0–200)
HDL: 38.7 mg/dL — ABNORMAL LOW (ref >39.00)
NONHDL: 129.3
TRIGLYCERIDES: 241 mg/dL — AB (ref 0.0–149.0)
VLDL: 48.2 mg/dL — ABNORMAL HIGH (ref 0.0–40.0)

## 2013-12-22 LAB — HEMOGLOBIN A1C: Hgb A1c MFr Bld: 6.6 % — ABNORMAL HIGH (ref 4.6–6.5)

## 2013-12-22 LAB — HEPATIC FUNCTION PANEL
ALK PHOS: 76 U/L (ref 39–117)
ALT: 9 U/L (ref 0–35)
AST: 10 U/L (ref 0–37)
Albumin: 3.4 g/dL — ABNORMAL LOW (ref 3.5–5.2)
BILIRUBIN DIRECT: 0.1 mg/dL (ref 0.0–0.3)
BILIRUBIN TOTAL: 0.7 mg/dL (ref 0.2–1.2)
TOTAL PROTEIN: 6.9 g/dL (ref 6.0–8.3)

## 2013-12-22 LAB — LDL CHOLESTEROL, DIRECT: LDL DIRECT: 90.3 mg/dL

## 2013-12-22 LAB — TSH: TSH: 1.78 u[IU]/mL (ref 0.35–4.50)

## 2013-12-22 MED ORDER — FLUCONAZOLE 150 MG PO TABS: ORAL_TABLET | ORAL | Status: DC

## 2013-12-22 MED ORDER — CEPHALEXIN 500 MG PO CAPS: 500.0000 mg | ORAL_CAPSULE | Freq: Four times a day (QID) | ORAL | Status: DC

## 2013-12-22 MED ORDER — DIAZEPAM 5 MG PO TABS: ORAL_TABLET | ORAL | Status: DC

## 2013-12-22 NOTE — Progress Notes (Signed)
Subjective:    Patient ID: Kimberly Castillo, female    DOB: 09-20-1933, 80 y.o.   MRN: [id number]  HPI  Here for wellness and f/u;  Overall doing ok;  Pt denies CP, worsening SOB, DOE, wheezing, orthopnea, PND, worsening LE edema, palpitations, dizziness or syncope.  Pt denies neurological change such as new headache, facial or extremity weakness.  Pt denies polydipsia, polyuria, or low sugar symptoms. Pt states overall good compliance with treatment and medications, good tolerability, and has been trying to follow lower cholesterol diet.  Pt denies worsening depressive symptoms, suicidal ideation or panic. No fever, night sweats, wt loss, loss of appetite, or other constitutional symptoms.  Pt states good ability with ADL's, has low fall risk, home safety reviewed and adequate, no other significant changes in hearing or vision, and only occasionally active with exercise. Since retired, now .Denies worsening depressive symptoms, suicidal ideation, or panic; has ongoing anxiety, some increased recently, asks for valium prn revfill., duaghter stresses her out. Declines family counseling, as well as pneumovax Past Medical History  Diagnosis Date  . Glaucoma   . Diabetes mellitus   . Hypertension   . Hyperlipidemia   . Arthritis     hands, back  . History of shingles   . Cataracts, bilateral   . A-fib   . Post herpetic neuralgia 12/19/2012  . Chronic lower back pain 12/19/2012  . Arthritis, lumbar spine 12/19/2012   Past Surgical History  Procedure Laterality Date  . Incision and drainage breast abscess  1958    left  . Abdominal hysterectomy  1964    partial  . Colon resection N/A 09/02/2013    Procedure: [place] colectomy;  Surgeon: Excell Seltzer, MD;  Location: [facility];  Service: General;  Laterality: N/A;    reports that she has quit smoking. Her smoking use included Cigarettes. She started smoking about 22 years ago. She smoked 0.00 packs per day. She has never used smokeless tobacco. She  reports that she does not drink alcohol or use illicit drugs. family history includes Hypertension in her maternal grandmother and mother. There is no history of Colon cancer or Stomach cancer. No Known Allergies Current Outpatient Prescriptions on File Prior to Visit  Medication Sig Dispense Refill  . azelastine (OPTIVAR) 0.05 % ophthalmic solution Place 1 drop into both eyes 2 (two) times daily.    . hydrOXYzine (ATARAX/VISTARIL) 25 MG tablet Take 25 mg by mouth 3 (three) times daily as needed for itching.    . latanoprost (XALATAN) 0.005 % ophthalmic solution Place 1 drop into both eyes at bedtime.    [name] levobunolol (BETAGAN) 0.5 % ophthalmic solution Place 1 drop into both eyes 2 (two) times daily.    [name] losartan-hydrochlorothiazide (HYZAAR) 100-25 MG per tablet Take 1 tablet by mouth daily. DC any old scripts for same medication 90 tablet 4  . metFORMIN (GLUCOPHAGE) 500 MG tablet Take 1 tablet (500 mg total) by mouth 2 (two) times daily with a meal. 180 tablet 3  . nortriptyline (PAMELOR) 10 MG capsule 1-3 tabs by mouth at bedtime for Headache prevention 270 capsule 1  . pantoprazole (PROTONIX) 40 MG tablet Take 1 tablet (40 mg total) by mouth daily. 90 tablet 3  . potassium chloride SA (K-DUR,KLOR-CON) 20 MEQ tablet Take 1 tablet (20 mEq total) by mouth daily. 90 tablet 3  . simvastatin (ZOCOR) 40 MG tablet Take 1 tablet (40 mg total) by mouth at bedtime. DC any old scripts for same medication 90 tablet 4  .  valACYclovir (VALTREX) 500 MG tablet Take 1 tablet (500 mg total) by mouth 2 (two) times daily. 180 tablet 3  . zolpidem (AMBIEN) 5 MG tablet Take 5 mg by mouth at bedtime as needed. sleep     No current facility-administered medications on file prior to visit.    Review of Systems Constitutional: Negative for increased diaphoresis, other activity, appetite or other siginficant weight change  HENT: Negative for worsening hearing loss, ear pain, facial swelling, mouth sores and neck  stiffness.   Eyes: Negative for other worsening pain, redness or visual disturbance.  Respiratory: Negative for shortness of breath and wheezing.   Cardiovascular: Negative for chest pain and palpitations.  Gastrointestinal: Negative for diarrhea, blood in stool, abdominal distention or other pain Genitourinary: Negative for hematuria, flank pain or change in urine volume. , though has some dysuria last few days Musculoskeletal: Negative for myalgias or other joint complaints.  GYN: also with typical sounding yeast d/c, not recently sexually active Skin: Negative for color change and wound.  Neurological: Negative for syncope and numbness. other than noted Hematological: Negative for adenopathy. or other swelling Psychiatric/Behavioral: Negative for hallucinations, self-injury, decreased concentration or other worsening agitation.      Objective:   Physical Exam BP 120/78 mmHg  Pulse 86  Temp(Src) 98.4 F (36.9 C) (Oral)  Ht 5\' 2"  (1.575 m)  Wt 149 lb 4 oz (67.699 kg)  BMI 27.29 kg/m2  SpO2 92% VS noted,  Constitutional: Pt is oriented to person, place, and time. Appears well-developed and well-nourished.  Head: Normocephalic and atraumatic.  Right Ear: External ear normal.  Left Ear: External ear normal.  Nose: Nose normal.  Mouth/Throat: Oropharynx is clear and moist.  Eyes: Conjunctivae and EOM are normal. Pupils are equal, round, and reactive to light.  Neck: Normal range of motion. Neck supple. No JVD present. No tracheal deviation present.  Cardiovascular: Normal rate, regular rhythm, normal heart sounds and intact distal pulses.   Pulmonary/Chest: Effort normal and breath sounds without rales or wheezing  Abdominal: Soft. Bowel sounds are normal. NT. No HSM  Musculoskeletal: Normal range of motion. Exhibits no edema.  Lymphadenopathy:  Has no cervical adenopathy.  Neurological: Pt is alert and oriented to person, place, and time. Pt has normal reflexes. No cranial nerve  deficit. Motor grossly intact Skin: Skin is warm and dry. No rash noted.  Psychiatric:  Has normal mood and affect. Behavior is normal.     Assessment & Plan:

## 2013-12-22 NOTE — Patient Instructions (Addendum)
Please take all new medication as prescribed- the medication for yeast infection  We will need to check your urine testing before any other antibiotics  Please continue all other medications as before, and refills have been done if requested - the valium  Please have the pharmacy call with any other refills you may need.  Please continue your efforts at being more active, low cholesterol diet, and weight control.  You are otherwise up to date with prevention measures today.  Please keep your appointments with your specialists as you may have planned  Please go to the LAB in the Basement (turn left off the elevator) for the tests to be done today  You will be contacted by phone if any changes need to be made immediately.  Otherwise, you will receive a letter about your results with an explanation, but please check with MyChart first.  Please remember to sign up for MyChart if you have not done so, as this will be important to you in the future with finding out test results, communicating by private email, and scheduling acute appointments online when needed.  Please return in 6 months, or sooner if needed

## 2013-12-22 NOTE — Progress Notes (Signed)
Pre visit review using our clinic review tool, if applicable. No additional management support is needed unless otherwise documented below in the visit note. 

## 2013-12-23 ENCOUNTER — Encounter: Payer: Self-pay | Admitting: Internal Medicine

## 2013-12-31 DIAGNOSIS — Z0000 Encounter for general adult medical examination without abnormal findings: Secondary | ICD-10-CM | POA: Insufficient documentation

## 2013-12-31 NOTE — Assessment & Plan Note (Signed)
Ok for diflucan tx,  to f/u any worsening symptoms or concerns

## 2013-12-31 NOTE — Assessment & Plan Note (Signed)

## 2013-12-31 NOTE — Assessment & Plan Note (Signed)
stable overall by history and exam, recent data reviewed with pt, and pt to continue medical treatment as before,  to f/u any worsening symptoms or concerns BP Readings from Last 3 Encounters:  12/22/13 120/78  09/17/13 162/90  09/10/13 135/91

## 2013-12-31 NOTE — Assessment & Plan Note (Signed)
stable overall by history and exam, recent data reviewed with pt, and pt to continue medical treatment as before,  to f/u any worsening symptoms or concerns Lab Results  Component Value Date   HGBA1C 6.6* 12/22/2013

## 2014-01-29 DIAGNOSIS — M479 Spondylosis, unspecified: Secondary | ICD-10-CM | POA: Diagnosis not present

## 2014-02-23 ENCOUNTER — Telehealth: Payer: Self-pay | Admitting: Internal Medicine

## 2014-02-23 NOTE — Telephone Encounter (Signed)
This medication is not her list.

## 2014-02-23 NOTE — Telephone Encounter (Signed)
Ok to [place] for routine refill per office policy

## 2014-02-23 NOTE — Telephone Encounter (Signed)
Patient need a refill of Diltiazem, she is totally out

## 2014-02-23 NOTE — Telephone Encounter (Signed)
I have reviewd the med list , pt was seen dec 2015  She has not been hospd in the [facility] system  Did she have diltiazem started somewhere else?  I would to verify this, and verify the dose before prescribing, since there can be side effects risk  If nothing else, perhaps we can check with her pharmacy about by whom and when the prescription was started

## 2014-02-24 MED ORDER — DILTIAZEM HCL ER 120 MG PO CP24: 120.0000 mg | ORAL_CAPSULE | Freq: Every day | ORAL | Status: DC

## 2014-02-24 NOTE — Telephone Encounter (Signed)
Called pt's pharmacy. She was prescribed diltiazem 120mg , 1 tablet daily in Oct. 2015 by you. Not sure why it is not in her chart. [facility] has this med on her record.

## 2014-02-24 NOTE — Telephone Encounter (Signed)
OK for rx - done erx

## 2014-02-25 NOTE — Telephone Encounter (Signed)
Called pt no answer LMOM medication has been sent to [facility]...Kimberly Castillo

## 2014-03-01 ENCOUNTER — Telehealth: Payer: Self-pay | Admitting: *Deleted

## 2014-03-01 NOTE — Telephone Encounter (Signed)
Received fax stating pt can not take Diltiaz ER 120 mg capsules and want to request tablets instead. Is this ok to change to tablet form...Kimberly Castillo

## 2014-03-02 NOTE — Telephone Encounter (Signed)
The diltizaem ER 120 mg (24 hr capsule) is not aviailabe in a tablet form.  The option is to change to cardizem 30 mg FOUR times per day, but this would mean quite a few pills every day, and is not often done

## 2014-03-02 NOTE — Telephone Encounter (Signed)
Pls advise on msg below.../lmb 

## 2014-03-03 ENCOUNTER — Other Ambulatory Visit (INDEPENDENT_AMBULATORY_CARE_PROVIDER_SITE_OTHER): Payer: Self-pay

## 2014-03-03 DIAGNOSIS — Z933 Colostomy status: Secondary | ICD-10-CM | POA: Diagnosis not present

## 2014-03-03 DIAGNOSIS — Z433 Encounter for attention to colostomy: Secondary | ICD-10-CM

## 2014-03-03 MED ORDER — DILTIAZEM HCL 30 MG PO TABS: 30.0000 mg | ORAL_TABLET | Freq: Four times a day (QID) | ORAL | Status: DC

## 2014-03-03 NOTE — Telephone Encounter (Signed)
Notified [facility] spoke with pharmacist Desmond Lope gave md response. Sent rx electronically...Johny Chess

## 2014-03-05 ENCOUNTER — Other Ambulatory Visit: Payer: Self-pay

## 2014-03-05 MED ORDER — DILTIAZEM HCL 120 MG PO TABS: 120.0000 mg | ORAL_TABLET | Freq: Every day | ORAL | Status: DC

## 2014-03-08 ENCOUNTER — Telehealth: Payer: Self-pay | Admitting: *Deleted

## 2014-03-08 NOTE — Telephone Encounter (Signed)
Received fax stating received rx for #30 on Diltiazem 120 mg with 11 refills, but pt is wanting #90 supply due to insurance. Per chart med was change to cardizem 30 mg since original diltiazem 120 mg is on back order. See phone note 03/01/14...Kimberly Castillo

## 2014-03-10 ENCOUNTER — Ambulatory Visit
Admission: RE | Admit: 2014-03-10 | Discharge: 2014-03-10 | Disposition: A | Discharge status: Home / Self Care | Payer: Medicare Other | Source: Ambulatory Visit | Attending: General Surgery | Admitting: General Surgery

## 2014-03-10 DIAGNOSIS — K5730 Diverticulosis of large intestine without perforation or abscess without bleeding: Secondary | ICD-10-CM | POA: Diagnosis not present

## 2014-05-19 DIAGNOSIS — H4011X1 Primary open-angle glaucoma, mild stage: Secondary | ICD-10-CM | POA: Diagnosis not present

## 2014-05-19 DIAGNOSIS — H04123 Dry eye syndrome of bilateral lacrimal glands: Secondary | ICD-10-CM | POA: Diagnosis not present

## 2014-06-09 DIAGNOSIS — M5136 Other intervertebral disc degeneration, lumbar region: Secondary | ICD-10-CM | POA: Diagnosis not present

## 2014-06-24 ENCOUNTER — Encounter: Payer: Self-pay | Admitting: Internal Medicine

## 2014-06-24 ENCOUNTER — Other Ambulatory Visit (INDEPENDENT_AMBULATORY_CARE_PROVIDER_SITE_OTHER): Payer: Medicare Other

## 2014-06-24 ENCOUNTER — Ambulatory Visit (INDEPENDENT_AMBULATORY_CARE_PROVIDER_SITE_OTHER): Payer: Medicare Other | Admitting: Internal Medicine

## 2014-06-24 VITALS — BP 140/70 | HR 97 | Temp 97.9°F | Ht 62.0 in | Wt 158.5 lb

## 2014-06-24 DIAGNOSIS — E785 Hyperlipidemia, unspecified: Secondary | ICD-10-CM | POA: Diagnosis not present

## 2014-06-24 DIAGNOSIS — K2100 Gastro-esophageal reflux disease with esophagitis, without bleeding: Secondary | ICD-10-CM

## 2014-06-24 DIAGNOSIS — R21 Rash and other nonspecific skin eruption: Secondary | ICD-10-CM | POA: Insufficient documentation

## 2014-06-24 DIAGNOSIS — I10 Essential (primary) hypertension: Secondary | ICD-10-CM | POA: Diagnosis not present

## 2014-06-24 DIAGNOSIS — Z0000 Encounter for general adult medical examination without abnormal findings: Secondary | ICD-10-CM

## 2014-06-24 DIAGNOSIS — R7989 Other specified abnormal findings of blood chemistry: Secondary | ICD-10-CM | POA: Diagnosis not present

## 2014-06-24 DIAGNOSIS — E119 Type 2 diabetes mellitus without complications: Secondary | ICD-10-CM | POA: Diagnosis not present

## 2014-06-24 LAB — URINALYSIS, ROUTINE W REFLEX MICROSCOPIC
BILIRUBIN URINE: NEGATIVE
Ketones, ur: NEGATIVE
NITRITE: NEGATIVE
Specific Gravity, Urine: 1.025 (ref 1.000–1.030)
Total Protein, Urine: 100 — AB
Urine Glucose: NEGATIVE
Urobilinogen, UA: 0.2 (ref 0.0–1.0)
pH: 6 (ref 5.0–8.0)

## 2014-06-24 LAB — HEPATIC FUNCTION PANEL
ALBUMIN: 3.7 g/dL (ref 3.5–5.2)
ALT: 10 U/L (ref 0–35)
AST: 14 U/L (ref 0–37)
Alkaline Phosphatase: 73 U/L (ref 39–117)
Bilirubin, Direct: 0.1 mg/dL (ref 0.0–0.3)
TOTAL PROTEIN: 7.2 g/dL (ref 6.0–8.3)
Total Bilirubin: 0.5 mg/dL (ref 0.2–1.2)

## 2014-06-24 LAB — TSH: TSH: 0.4 u[IU]/mL (ref 0.35–4.50)

## 2014-06-24 LAB — LIPID PANEL
Cholesterol: 168 mg/dL (ref 0–200)
HDL: 40.6 mg/dL (ref >39.00)
NonHDL: 127.4
Total CHOL/HDL Ratio: 4
Triglycerides: 250 mg/dL — ABNORMAL HIGH (ref 0.0–149.0)
VLDL: 50 mg/dL — ABNORMAL HIGH (ref 0.0–40.0)

## 2014-06-24 LAB — CBC WITH DIFFERENTIAL/PLATELET
BASOS ABS: 0.2 10*3/uL — AB (ref 0.0–0.1)
Basophils Relative: 2.2 % (ref 0.0–3.0)
Eosinophils Absolute: 0.4 10*3/uL (ref 0.0–0.7)
Eosinophils Relative: 4.6 % (ref 0.0–5.0)
HCT: 35.9 % — ABNORMAL LOW (ref 36.0–46.0)
Hemoglobin: 11.8 g/dL — ABNORMAL LOW (ref 12.0–15.0)
Lymphocytes Relative: 24.2 % (ref 12.0–46.0)
Lymphs Abs: 1.9 10*3/uL (ref 0.7–4.0)
MCHC: 32.7 g/dL (ref 30.0–36.0)
MCV: 92.6 fl (ref 78.0–100.0)
MONOS PCT: 6.5 % (ref 3.0–12.0)
Monocytes Absolute: 0.5 10*3/uL (ref 0.1–1.0)
NEUTROS PCT: 62.5 % (ref 43.0–77.0)
Neutro Abs: 4.9 10*3/uL (ref 1.4–7.7)
PLATELETS: 274 10*3/uL (ref 150.0–400.0)
RBC: 3.88 Mil/uL (ref 3.87–5.11)
RDW: 19.3 % — ABNORMAL HIGH (ref 11.5–15.5)
WBC: 7.8 10*3/uL (ref 4.0–10.5)

## 2014-06-24 LAB — HEMOGLOBIN A1C: HEMOGLOBIN A1C: 6.3 % (ref 4.6–6.5)

## 2014-06-24 LAB — BASIC METABOLIC PANEL
BUN: 28 mg/dL — ABNORMAL HIGH (ref 6–23)
CALCIUM: 10 mg/dL (ref 8.4–10.5)
CO2: 29 mEq/L (ref 19–32)
Chloride: 105 mEq/L (ref 96–112)
Creatinine, Ser: 1.36 mg/dL — ABNORMAL HIGH (ref 0.40–1.20)
GFR: 48.18 mL/min — ABNORMAL LOW (ref >60.00)
GLUCOSE: 208 mg/dL — AB (ref 70–99)
Potassium: 3 mEq/L — ABNORMAL LOW (ref 3.5–5.1)
Sodium: 141 mEq/L (ref 135–145)

## 2014-06-24 LAB — LDL CHOLESTEROL, DIRECT: Direct LDL: 88 mg/dL

## 2014-06-24 LAB — MICROALBUMIN / CREATININE URINE RATIO
CREATININE, U: 279.5 mg/dL
MICROALB UR: 35 mg/dL — AB (ref 0.0–1.9)
MICROALB/CREAT RATIO: 12.5 mg/g (ref 0.0–30.0)

## 2014-06-24 MED ORDER — PREDNISONE 10 MG PO TABS: ORAL_TABLET | ORAL | Status: DC

## 2014-06-24 MED ORDER — PANTOPRAZOLE SODIUM 40 MG PO TBEC: 40.0000 mg | DELAYED_RELEASE_TABLET | Freq: Every day | ORAL | Status: DC

## 2014-06-24 NOTE — Assessment & Plan Note (Signed)

## 2014-06-24 NOTE — Progress Notes (Signed)
Pre visit review using our clinic review tool, if applicable. No additional management support is needed unless otherwise documented below in the visit note. 

## 2014-06-24 NOTE — Assessment & Plan Note (Signed)
For PPI refill today, o/w stable

## 2014-06-24 NOTE — Assessment & Plan Note (Signed)
stable overall by history and exam, recent data reviewed with pt, and pt to continue medical treatment as before,  to f/u any worsening symptoms or concerns Lab Results  Component Value Date   HGBA1C 6.6* 12/22/2013

## 2014-06-24 NOTE — Assessment & Plan Note (Signed)
C/w likely allergic rash, for prednisone short course low dose, benadryl cr prn,  to f/u any worsening symptoms or concerns

## 2014-06-24 NOTE — Patient Instructions (Addendum)
Please take all new medication as prescribed - the prednisone  Please continue all other medications as before, and refills have been done if requested.  Please have the pharmacy call with any other refills you may need.  Please continue your efforts at being more active, low cholesterol diet, and weight control.  You are otherwise up to date with prevention measures today.  Please keep your appointments with your specialists as you may have planned  Please go to the LAB in the Basement (turn left off the elevator) for the tests to be done today  You will be contacted by phone if any changes need to be made immediately.  Otherwise, you will receive a letter about your results with an explanation, but please check with MyChart first.  Please remember to sign up for MyChart if you have not done so, as this will be important to you in the future with finding out test results, communicating by private email, and scheduling acute appointments online when needed.  Please return in 6 months, or sooner if needed, with Lab testing done 3-5 days before

## 2014-06-24 NOTE — Progress Notes (Signed)
Subjective:    Patient ID: Kimberly Castillo, female    DOB: [birth year], 81 y.o.   MRN: [id number]  HPI    Here for wellness and f/u;  Overall doing ok;  Pt denies Chest pain, worsening SOB, DOE, wheezing, orthopnea, PND, worsening LE edema, palpitations, dizziness or syncope.  Pt denies neurological change such as new headache, facial or extremity weakness.  Pt denies polydipsia, polyuria, or low sugar symptoms. Pt states overall good compliance with treatment and medications, good tolerability, and has been trying to follow appropriate diet.  Pt denies worsening depressive symptoms, suicidal ideation or panic. No fever, night sweats, wt loss, loss of appetite, or other constitutional symptoms.  Pt states good ability with ADL's, has low fall risk, home safety reviewed and adequate, no other significant changes in hearing or vision, and only occasionally active with exercise.  Has itchy Rash to arms and legs wit itch x 5 days, no pain , no fever Past Medical History  Diagnosis Date  . Glaucoma   . Diabetes mellitus   . Hypertension   . Hyperlipidemia   . Arthritis     hands, back  . History of shingles   . Cataracts, bilateral   . A-fib   . Post herpetic neuralgia 12/19/2012  . Chronic lower back pain 12/19/2012  . Arthritis, lumbar spine 12/19/2012  . Anxiety state 12/22/2013   Past Surgical History  Procedure Laterality Date  . Incision and drainage breast abscess  1958    left  . Abdominal hysterectomy  1964    partial  . Colon resection N/A 09/02/2013    Procedure: [place] colectomy;  Surgeon: Excell Seltzer, MD;  Location: [facility];  Service: General;  Laterality: N/A;    reports that she has quit smoking. Her smoking use included Cigarettes. She started smoking about 22 years ago. She has never used smokeless tobacco. She reports that she does not drink alcohol or use illicit drugs. family history includes Hypertension in her maternal grandmother and mother. There is no history of  Colon cancer or Stomach cancer. No Known Allergies Current Outpatient Prescriptions on File Prior to Visit  Medication Sig Dispense Refill  . azelastine (OPTIVAR) 0.05 % ophthalmic solution Place 1 drop into both eyes 2 (two) times daily.    . diazepam (VALIUM) 5 MG tablet 1/2 to 1 tab by mouth once per day as needed 30 tablet 2  . diltiazem (CARDIZEM) 30 MG tablet Take 1 tablet (30 mg total) by mouth 4 (four) times daily. 120 tablet 11  . hydrOXYzine (ATARAX/VISTARIL) 25 MG tablet Take 25 mg by mouth 3 (three) times daily as needed for itching.    . latanoprost (XALATAN) 0.005 % ophthalmic solution Place 1 drop into both eyes at bedtime.    [name] levobunolol (BETAGAN) 0.5 % ophthalmic solution Place 1 drop into both eyes 2 (two) times daily.    [name] losartan-hydrochlorothiazide (HYZAAR) 100-25 MG per tablet Take 1 tablet by mouth daily. DC any old scripts for same medication 90 tablet 4  . metFORMIN (GLUCOPHAGE) 500 MG tablet Take 1 tablet (500 mg total) by mouth 2 (two) times daily with a meal. 180 tablet 3  . nortriptyline (PAMELOR) 10 MG capsule 1-3 tabs by mouth at bedtime for Headache prevention 270 capsule 1  . potassium chloride SA (K-DUR,KLOR-CON) 20 MEQ tablet Take 1 tablet (20 mEq total) by mouth daily. 90 tablet 3  . simvastatin (ZOCOR) 40 MG tablet Take 1 tablet (40 mg total) by mouth at bedtime.  DC any old scripts for same medication 90 tablet 4  . valACYclovir (VALTREX) 500 MG tablet Take 1 tablet (500 mg total) by mouth 2 (two) times daily. 180 tablet 3  . zolpidem (AMBIEN) 5 MG tablet Take 5 mg by mouth at bedtime as needed. sleep     No current facility-administered medications on file prior to visit.     Review of Systems Constitutional: Negative for increased diaphoresis, other activity, appetite or siginficant weight change other than noted HENT: Negative for worsening hearing loss, ear pain, facial swelling, mouth sores and neck stiffness.   Eyes: Negative for other worsening  pain, redness or visual disturbance.  Respiratory: Negative for shortness of breath and wheezing  Cardiovascular: Negative for chest pain and palpitations.  Gastrointestinal: Negative for diarrhea, blood in stool, abdominal distention or other pain Genitourinary: Negative for hematuria, flank pain or change in urine volume.  Musculoskeletal: Negative for myalgias or other joint complaints.  Skin: Negative for color change and wound or drainage.  Neurological: Negative for syncope and numbness. other than noted Hematological: Negative for adenopathy. or other swelling Psychiatric/Behavioral: Negative for hallucinations, SI, self-injury, decreased concentration or other worsening agitation.      Objective:   Physical Exam BP 140/70 mmHg  Pulse 97  Temp(Src) 97.9 F (36.6 C) (Oral)  Ht 5\' 2"  (1.575 m)  Wt 158 lb 8 oz (71.895 kg)  BMI 28.98 kg/m2  SpO2 96% VS noted,  Constitutional: Pt is oriented to person, place, and time. Appears well-developed and well-nourished, in no significant distress Head: Normocephalic and atraumatic.  Right Ear: External ear normal.  Left Ear: External ear normal.  Nose: Nose normal.  Mouth/Throat: Oropharynx is clear and moist.  Eyes: Conjunctivae and EOM are normal. Pupils are equal, round, and reactive to light.  Neck: Normal range of motion. Neck supple. No JVD present. No tracheal deviation present or significant neck LA or mass Cardiovascular: Normal rate, regular rhythm, normal heart sounds and intact distal pulses.   Pulmonary/Chest: Effort normal and breath sounds without rales or wheezing  Abdominal: Soft. Bowel sounds are normal. NT. No HSM  Musculoskeletal: Normal range of motion. Exhibits no edema.  Lymphadenopathy:  Has no cervical adenopathy.  Neurological: Pt is alert and oriented to person, place, and time. Pt has normal reflexes. No cranial nerve deficit. Motor grossly intact Skin: Skin is warm and dry. nunmerous small nontender slightly  raised rash pruritic to extremities.  Psychiatric:  Has normal mood and affect. Behavior is normal.     Assessment & Plan:

## 2014-06-25 ENCOUNTER — Encounter: Payer: Self-pay | Admitting: Internal Medicine

## 2014-06-25 ENCOUNTER — Other Ambulatory Visit: Payer: Self-pay | Admitting: Internal Medicine

## 2014-06-25 MED ORDER — POTASSIUM CHLORIDE ER 10 MEQ PO TBCR: EXTENDED_RELEASE_TABLET | ORAL | Status: DC

## 2014-06-25 MED ORDER — CEPHALEXIN 500 MG PO CAPS: 500.0000 mg | ORAL_CAPSULE | Freq: Four times a day (QID) | ORAL | Status: DC

## 2014-07-28 ENCOUNTER — Other Ambulatory Visit: Payer: Self-pay | Admitting: Internal Medicine

## 2014-07-29 NOTE — Telephone Encounter (Signed)
Done hardcopy to Dahlia  

## 2014-07-29 NOTE — Telephone Encounter (Signed)
Rx faxed to pharmacy  

## 2014-07-30 ENCOUNTER — Telehealth: Payer: Self-pay | Admitting: Internal Medicine

## 2014-07-30 NOTE — Telephone Encounter (Signed)
Patients still has burning urination and its cloudy. She is requesting refills for cephALEXin (KEFLEX) 500 MG capsule [id number] and predniSONE (DELTASONE) 10 MG tablet [id number] Pharmacy is [facility] on [address] Please advise

## 2014-07-30 NOTE — Telephone Encounter (Signed)
Very sorry, we normally do not prescribe antibx or prednisone in this way.  Please consider OV, such as our Saturday clinic. thanks

## 2014-08-02 NOTE — Telephone Encounter (Signed)
Called pt no answer LMOM with md response.../lmb 

## 2014-08-09 DIAGNOSIS — M5136 Other intervertebral disc degeneration, lumbar region: Secondary | ICD-10-CM | POA: Diagnosis not present

## 2014-09-20 ENCOUNTER — Other Ambulatory Visit: Payer: Self-pay | Admitting: Internal Medicine

## 2014-09-20 DIAGNOSIS — M189 Osteoarthritis of first carpometacarpal joint, unspecified: Secondary | ICD-10-CM | POA: Diagnosis not present

## 2014-09-27 ENCOUNTER — Telehealth: Payer: Self-pay | Admitting: Internal Medicine

## 2014-09-27 NOTE — Telephone Encounter (Signed)
Patient would like for her prescription for valACYclovir (VALTREX) 500 MG tablet [id number] to be sent to the [facility] on [address]. She is also requesting a refill for her medication for shingles. She doesn't know the name of it. She is totally out of both these medications.

## 2014-09-27 NOTE — Telephone Encounter (Signed)
Pt should no longer need valtrex if the reason is for shingles, which has resolved  I am not sure what other medication to which she refers, maybe she can have the pharmacy let us know which she needs

## 2014-09-29 ENCOUNTER — Other Ambulatory Visit: Payer: Self-pay | Admitting: General Surgery

## 2014-09-29 DIAGNOSIS — Z933 Colostomy status: Secondary | ICD-10-CM | POA: Diagnosis not present

## 2014-09-29 NOTE — Telephone Encounter (Signed)
Can you please call patient back at [phone]

## 2014-09-29 NOTE — Telephone Encounter (Signed)
Pt advised.

## 2014-09-30 ENCOUNTER — Other Ambulatory Visit: Payer: Self-pay | Admitting: Internal Medicine

## 2014-10-18 DIAGNOSIS — M179 Osteoarthritis of knee, unspecified: Secondary | ICD-10-CM | POA: Diagnosis not present

## 2014-10-29 NOTE — Patient Instructions (Addendum)
YOUR PROCEDURE IS SCHEDULED ON :  11/09/14  REPORT TO [place] MAIN ENTRANCE FOLLOW SIGNS TO EAST ELEVATOR - GO TO 3rd FLOOR CHECK IN AT 3 EAST NURSES STATION (SHORT STAY) AT:  11:30 AM  CALL THIS NUMBER IF YOU HAVE PROBLEMS THE MORNING OF SURGERY [phone]  REMEMBER:ONLY 1 PER PERSON MAY GO TO SHORT STAY WITH YOU TO GET READY THE MORNING OF YOUR SURGERY  DO NOT EAT FOOD OR DRINK LIQUIDS AFTER MIDNIGHT  STOP ASPIRIN / IBUPROFEN / ALEVE / VITAMINS / HERBAL MEDS __7__ DAYS BEFORE SURGERY  TAKE THESE MEDICINES THE MORNING OF SURGERY: PROTONIX / VALTREX / DILTIAZEM / MAY TAKE HYDROCODONE IF NEEDED  FOLLOW BOWEL FROM OFFICE  YOU MAY NOT HAVE ANY METAL ON YOUR BODY INCLUDING HAIR PINS AND PIERCING'S. DO NOT WEAR JEWELRY, MAKEUP, LOTIONS, POWDERS OR PERFUMES. DO NOT WEAR NAIL POLISH. DO NOT SHAVE 48 HRS PRIOR TO SURGERY. MEN MAY SHAVE FACE AND NECK.  DO NOT [place]. [place] IS NOT RESPONSIBLE FOR VALUABLES.  CONTACTS, DENTURES OR PARTIALS MAY NOT BE WORN TO SURGERY. LEAVE SUITCASE IN CAR. CAN BE BROUGHT TO ROOM AFTER SURGERY.  PATIENTS DISCHARGED THE DAY OF SURGERY WILL NOT BE ALLOWED TO DRIVE HOME.  PLEASE READ OVER THE FOLLOWING INSTRUCTION SHEETS                                            [place] - PREPARING FOR SURGERY  Before surgery, you can play an important role.  Because skin is not sterile, your skin needs to be as free of germs as possible.  You can reduce the number of germs on your skin by washing with CHG (chlorahexidine gluconate) soap before surgery.  CHG is an antiseptic cleaner which kills germs and bonds with the skin to continue killing germs even after washing. Please DO NOT use if you have an allergy to CHG or antibacterial soaps.  If your skin becomes reddened/irritated stop using the CHG and inform your nurse when you arrive at Short Stay. Do not shave (including legs and underarms) for at least 48 hours prior  to the first CHG shower.  You may shave your face. Please follow these instructions carefully:   1.  Shower with CHG Soap the night before surgery and the  morning of Surgery.   2.  If you choose to wash your hair, wash your hair first as usual with your  normal  Shampoo.   3.  After you shampoo, rinse your hair and body thoroughly to remove the  shampoo.                                         4.  Use CHG as you would any other liquid soap.  You can apply chg directly  to the skin and wash . Gently wash with scrungie or clean wascloth    5.  Apply the CHG Soap to your body ONLY FROM THE NECK DOWN.   Do not use on open                           Wound or open sores. Avoid contact with eyes, ears mouth and genitals (private parts).  Genitals (private parts) with your normal soap.              6.  Wash thoroughly, paying special attention to the area where your surgery  will be performed.   7.  Thoroughly rinse your body with warm water from the neck down.   8.  DO NOT shower/wash with your normal soap after using and rinsing off  the CHG Soap .                9.  Pat yourself dry with a clean towel.             10.  Wear clean night clothes to bed after shower             11.  Place clean sheets on your bed the night of your first shower and do not  sleep with pets.  Day of Surgery : Do not apply any lotions/deodorants the morning of surgery.  Please wear clean clothes to the hospital/surgery center.  FAILURE TO FOLLOW THESE INSTRUCTIONS MAY RESULT IN THE CANCELLATION OF YOUR SURGERY    PATIENT SIGNATURE_________________________________  ______________________________________________________________________    _________________________________________________________________________________

## 2014-11-01 ENCOUNTER — Encounter (HOSPITAL_COMMUNITY)
Admission: RE | Admit: 2014-11-01 | Discharge: 2014-11-01 | Disposition: A | Discharge status: Home / Self Care | Payer: Medicare Other | Source: Ambulatory Visit | Attending: General Surgery | Admitting: General Surgery

## 2014-11-01 ENCOUNTER — Encounter (HOSPITAL_COMMUNITY): Payer: Self-pay

## 2014-11-01 ENCOUNTER — Other Ambulatory Visit (HOSPITAL_COMMUNITY): Payer: Self-pay | Admitting: *Deleted

## 2014-11-01 DIAGNOSIS — Z01818 Encounter for other preprocedural examination: Secondary | ICD-10-CM | POA: Insufficient documentation

## 2014-11-01 DIAGNOSIS — Z933 Colostomy status: Secondary | ICD-10-CM | POA: Diagnosis not present

## 2014-11-01 HISTORY — DX: Headache, unspecified: R51.9

## 2014-11-01 HISTORY — DX: Diverticulitis of intestine, part unspecified, without perforation or abscess without bleeding: K57.92

## 2014-11-01 HISTORY — DX: Anesthesia of skin: R20.0

## 2014-11-01 HISTORY — DX: Cardiac arrhythmia, unspecified: I49.9

## 2014-11-01 HISTORY — DX: Cardiac murmur, unspecified: R01.1

## 2014-11-01 HISTORY — DX: Headache: R51

## 2014-11-01 LAB — CBC
HEMATOCRIT: 34.7 % — AB (ref 36.0–46.0)
Hemoglobin: 10.9 g/dL — ABNORMAL LOW (ref 12.0–15.0)
MCH: 31.4 pg (ref 26.0–34.0)
MCHC: 31.4 g/dL (ref 30.0–36.0)
MCV: 100 fL (ref 78.0–100.0)
PLATELETS: 207 10*3/uL (ref 150–400)
RBC: 3.47 MIL/uL — AB (ref 3.87–5.11)
RDW: 16.4 % — AB (ref 11.5–15.5)
WBC: 9.4 10*3/uL (ref 4.0–10.5)

## 2014-11-01 LAB — TYPE AND SCREEN
ABO/RH(D): B POS
Antibody Screen: NEGATIVE

## 2014-11-01 LAB — BASIC METABOLIC PANEL
Anion gap: 6 (ref 5–15)
BUN: 37 mg/dL — AB (ref 6–20)
CHLORIDE: 108 mmol/L (ref 101–111)
CO2: 29 mmol/L (ref 22–32)
CREATININE: 1.49 mg/dL — AB (ref 0.44–1.00)
Calcium: 9.6 mg/dL (ref 8.9–10.3)
GFR calc Af Amer: 37 mL/min — ABNORMAL LOW (ref >60)
GFR calc non Af Amer: 32 mL/min — ABNORMAL LOW (ref >60)
Glucose, Bld: 122 mg/dL — ABNORMAL HIGH (ref 65–99)
POTASSIUM: 3.9 mmol/L (ref 3.5–5.1)
Sodium: 143 mmol/L (ref 135–145)

## 2014-11-01 LAB — ABO/RH: ABO/RH(D): B POS

## 2014-11-01 NOTE — Progress Notes (Signed)
Abnormal BMET faxed to Dr.Hoxworth

## 2014-11-02 LAB — HEMOGLOBIN A1C
HEMOGLOBIN A1C: 6.9 % — AB (ref 4.8–5.6)
Mean Plasma Glucose: 151 mg/dL

## 2014-11-09 ENCOUNTER — Inpatient Hospital Stay (HOSPITAL_COMMUNITY)
Admission: RE | Admit: 2014-11-09 | Discharge: 2014-11-16 | DRG: 330 | Disposition: A | Discharge status: Home Health | Payer: Medicare Other | Source: Ambulatory Visit | Attending: General Surgery | Admitting: General Surgery

## 2014-11-09 ENCOUNTER — Encounter (HOSPITAL_COMMUNITY): Payer: Self-pay | Admitting: *Deleted

## 2014-11-09 ENCOUNTER — Inpatient Hospital Stay (HOSPITAL_COMMUNITY): Payer: Medicare Other | Admitting: Certified Registered Nurse Anesthetist

## 2014-11-09 ENCOUNTER — Encounter (HOSPITAL_COMMUNITY)
Admission: RE | Disposition: A | Discharge status: Home Health | Payer: Self-pay | Source: Ambulatory Visit | Attending: General Surgery

## 2014-11-09 DIAGNOSIS — Z7984 Long term (current) use of oral hypoglycemic drugs: Secondary | ICD-10-CM | POA: Diagnosis not present

## 2014-11-09 DIAGNOSIS — N189 Chronic kidney disease, unspecified: Secondary | ICD-10-CM | POA: Diagnosis not present

## 2014-11-09 DIAGNOSIS — K219 Gastro-esophageal reflux disease without esophagitis: Secondary | ICD-10-CM | POA: Diagnosis not present

## 2014-11-09 DIAGNOSIS — H409 Unspecified glaucoma: Secondary | ICD-10-CM | POA: Diagnosis not present

## 2014-11-09 DIAGNOSIS — Z01812 Encounter for preprocedural laboratory examination: Secondary | ICD-10-CM | POA: Diagnosis not present

## 2014-11-09 DIAGNOSIS — Z79899 Other long term (current) drug therapy: Secondary | ICD-10-CM

## 2014-11-09 DIAGNOSIS — K5720 Diverticulitis of large intestine with perforation and abscess without bleeding: Secondary | ICD-10-CM | POA: Diagnosis present

## 2014-11-09 DIAGNOSIS — I129 Hypertensive chronic kidney disease with stage 1 through stage 4 chronic kidney disease, or unspecified chronic kidney disease: Secondary | ICD-10-CM | POA: Diagnosis not present

## 2014-11-09 DIAGNOSIS — Z433 Encounter for attention to colostomy: Secondary | ICD-10-CM | POA: Diagnosis not present

## 2014-11-09 DIAGNOSIS — Z87891 Personal history of nicotine dependence: Secondary | ICD-10-CM

## 2014-11-09 DIAGNOSIS — I10 Essential (primary) hypertension: Secondary | ICD-10-CM | POA: Diagnosis not present

## 2014-11-09 DIAGNOSIS — I4891 Unspecified atrial fibrillation: Secondary | ICD-10-CM | POA: Diagnosis present

## 2014-11-09 DIAGNOSIS — Z90710 Acquired absence of both cervix and uterus: Secondary | ICD-10-CM

## 2014-11-09 DIAGNOSIS — E119 Type 2 diabetes mellitus without complications: Secondary | ICD-10-CM | POA: Diagnosis not present

## 2014-11-09 DIAGNOSIS — E1122 Type 2 diabetes mellitus with diabetic chronic kidney disease: Secondary | ICD-10-CM | POA: Diagnosis not present

## 2014-11-09 DIAGNOSIS — E785 Hyperlipidemia, unspecified: Secondary | ICD-10-CM | POA: Diagnosis present

## 2014-11-09 DIAGNOSIS — K660 Peritoneal adhesions (postprocedural) (postinfection): Secondary | ICD-10-CM | POA: Diagnosis present

## 2014-11-09 DIAGNOSIS — Z933 Colostomy status: Secondary | ICD-10-CM | POA: Diagnosis not present

## 2014-11-09 DIAGNOSIS — R0603 Acute respiratory distress: Secondary | ICD-10-CM

## 2014-11-09 DIAGNOSIS — Z8619 Personal history of other infectious and parasitic diseases: Secondary | ICD-10-CM | POA: Diagnosis not present

## 2014-11-09 DIAGNOSIS — K432 Incisional hernia without obstruction or gangrene: Secondary | ICD-10-CM | POA: Diagnosis not present

## 2014-11-09 DIAGNOSIS — R0602 Shortness of breath: Secondary | ICD-10-CM | POA: Diagnosis not present

## 2014-11-09 DIAGNOSIS — R05 Cough: Secondary | ICD-10-CM | POA: Diagnosis not present

## 2014-11-09 HISTORY — PX: COLOSTOMY TAKEDOWN: SHX5783

## 2014-11-09 LAB — GLUCOSE, CAPILLARY
GLUCOSE-CAPILLARY: 96 mg/dL (ref 65–99)
Glucose-Capillary: 190 mg/dL — ABNORMAL HIGH (ref 65–99)

## 2014-11-09 LAB — MRSA PCR SCREENING: MRSA by PCR: NEGATIVE

## 2014-11-09 SURGERY — CLOSURE, COLOSTOMY
Anesthesia: General

## 2014-11-09 MED ORDER — DEXTROSE 5 % IV SOLN
2.0000 g | INTRAVENOUS | Status: AC
  Administered 2014-11-09: 2 g via INTRAVENOUS
  Filled 2014-11-09: qty 2

## 2014-11-09 MED ORDER — LABETALOL HCL 5 MG/ML IV SOLN
INTRAVENOUS | Status: AC
  Filled 2014-11-09: qty 4

## 2014-11-09 MED ORDER — FENTANYL CITRATE (PF) 100 MCG/2ML IJ SOLN
25.0000 ug | INTRAMUSCULAR | Status: DC | PRN
  Administered 2014-11-09 (×5): 50 ug via INTRAVENOUS

## 2014-11-09 MED ORDER — VITAMINS A & D EX OINT
TOPICAL_OINTMENT | CUTANEOUS | Status: AC
  Administered 2014-11-09: 1
  Filled 2014-11-09: qty 5

## 2014-11-09 MED ORDER — ONDANSETRON HCL 4 MG/2ML IJ SOLN
INTRAMUSCULAR | Status: AC
  Filled 2014-11-09: qty 2

## 2014-11-09 MED ORDER — METOPROLOL TARTRATE 1 MG/ML IV SOLN
5.0000 mg | Freq: Four times a day (QID) | INTRAVENOUS | Status: DC | PRN
  Administered 2014-11-09 – 2014-11-10 (×3): 5 mg via INTRAVENOUS
  Filled 2014-11-09 (×2): qty 5

## 2014-11-09 MED ORDER — MORPHINE SULFATE (PF) 2 MG/ML IV SOLN
INTRAVENOUS | Status: AC
  Filled 2014-11-09: qty 1

## 2014-11-09 MED ORDER — TIMOLOL MALEATE 0.5 % OP SOLN
1.0000 [drp] | Freq: Every day | OPHTHALMIC | Status: DC
  Administered 2014-11-09 – 2014-11-16 (×8): 1 [drp] via OPHTHALMIC
  Filled 2014-11-09: qty 5

## 2014-11-09 MED ORDER — HYDROCHLOROTHIAZIDE 25 MG PO TABS
25.0000 mg | ORAL_TABLET | Freq: Every day | ORAL | Status: DC
  Administered 2014-11-10 – 2014-11-16 (×7): 25 mg via ORAL
  Filled 2014-11-09 (×7): qty 1

## 2014-11-09 MED ORDER — MIDAZOLAM HCL 2 MG/2ML IJ SOLN
INTRAMUSCULAR | Status: AC
  Filled 2014-11-09: qty 4

## 2014-11-09 MED ORDER — CEFOTETAN DISODIUM-DEXTROSE 2-2.08 GM-% IV SOLR
INTRAVENOUS | Status: AC
  Filled 2014-11-09: qty 50

## 2014-11-09 MED ORDER — FENTANYL CITRATE (PF) 100 MCG/2ML IJ SOLN
INTRAMUSCULAR | Status: AC
  Filled 2014-11-09: qty 2

## 2014-11-09 MED ORDER — FENTANYL CITRATE (PF) 250 MCG/5ML IJ SOLN
INTRAMUSCULAR | Status: AC
  Filled 2014-11-09: qty 25

## 2014-11-09 MED ORDER — ALVIMOPAN 12 MG PO CAPS
12.0000 mg | ORAL_CAPSULE | Freq: Once | ORAL | Status: AC
  Administered 2014-11-09: 12 mg via ORAL
  Filled 2014-11-09: qty 1

## 2014-11-09 MED ORDER — MORPHINE SULFATE (PF) 2 MG/ML IV SOLN
2.0000 mg | INTRAVENOUS | Status: DC | PRN
  Administered 2014-11-09: 2 mg via INTRAVENOUS
  Administered 2014-11-09 – 2014-11-10 (×3): 4 mg via INTRAVENOUS
  Administered 2014-11-10: 2 mg via INTRAVENOUS
  Administered 2014-11-10: 4 mg via INTRAVENOUS
  Administered 2014-11-10: 2 mg via INTRAVENOUS
  Administered 2014-11-10: 4 mg via INTRAVENOUS
  Administered 2014-11-10: 2 mg via INTRAVENOUS
  Administered 2014-11-10 (×2): 4 mg via INTRAVENOUS
  Administered 2014-11-10 – 2014-11-11 (×6): 2 mg via INTRAVENOUS
  Administered 2014-11-11: 4 mg via INTRAVENOUS
  Administered 2014-11-12 – 2014-11-15 (×2): 2 mg via INTRAVENOUS
  Filled 2014-11-09 (×5): qty 1
  Filled 2014-11-09: qty 2
  Filled 2014-11-09: qty 1
  Filled 2014-11-09: qty 2
  Filled 2014-11-09: qty 1
  Filled 2014-11-09 (×4): qty 2
  Filled 2014-11-09: qty 1
  Filled 2014-11-09: qty 2
  Filled 2014-11-09: qty 1
  Filled 2014-11-09: qty 2
  Filled 2014-11-09: qty 1
  Filled 2014-11-09: qty 2

## 2014-11-09 MED ORDER — PEG 3350-KCL-NA BICARB-NACL 420 G PO SOLR: 4000.0000 mL | Freq: Once | ORAL | Status: DC

## 2014-11-09 MED ORDER — CHLORHEXIDINE GLUCONATE CLOTH 2 % EX PADS: 6.0000 | MEDICATED_PAD | Freq: Once | CUTANEOUS | Status: DC

## 2014-11-09 MED ORDER — POTASSIUM CHLORIDE IN NACL 20-0.9 MEQ/L-% IV SOLN
INTRAVENOUS | Status: DC
  Administered 2014-11-09: 22:00:00 via INTRAVENOUS
  Administered 2014-11-10: 100 mL/h via INTRAVENOUS
  Administered 2014-11-10 – 2014-11-14 (×6): via INTRAVENOUS
  Filled 2014-11-09 (×10): qty 1000

## 2014-11-09 MED ORDER — PHENYLEPHRINE HCL 10 MG/ML IJ SOLN
INTRAMUSCULAR | Status: DC | PRN
  Administered 2014-11-09: 80 ug via INTRAVENOUS
  Administered 2014-11-09: 160 ug via INTRAVENOUS
  Administered 2014-11-09 (×2): 120 ug via INTRAVENOUS

## 2014-11-09 MED ORDER — PHENYLEPHRINE HCL 10 MG/ML IJ SOLN
10.0000 mg | INTRAVENOUS | Status: DC | PRN
  Administered 2014-11-09: 30 ug/min via INTRAVENOUS

## 2014-11-09 MED ORDER — LABETALOL HCL 5 MG/ML IV SOLN
INTRAVENOUS | Status: DC | PRN
  Administered 2014-11-09 (×4): 5 mg via INTRAVENOUS

## 2014-11-09 MED ORDER — LATANOPROST 0.005 % OP SOLN
1.0000 [drp] | Freq: Every day | OPHTHALMIC | Status: DC
  Administered 2014-11-10 – 2014-11-15 (×6): 1 [drp] via OPHTHALMIC
  Filled 2014-11-09: qty 2.5

## 2014-11-09 MED ORDER — OXYCODONE-ACETAMINOPHEN 5-325 MG PO TABS
1.0000 | ORAL_TABLET | ORAL | Status: DC | PRN
  Administered 2014-11-11: 1 via ORAL
  Administered 2014-11-11 – 2014-11-15 (×14): 2 via ORAL
  Administered 2014-11-16: 1 via ORAL
  Filled 2014-11-09 (×4): qty 2
  Filled 2014-11-09: qty 1
  Filled 2014-11-09 (×11): qty 2

## 2014-11-09 MED ORDER — ONDANSETRON HCL 4 MG/2ML IJ SOLN
INTRAMUSCULAR | Status: DC | PRN
  Administered 2014-11-09: 4 mg via INTRAVENOUS

## 2014-11-09 MED ORDER — SODIUM CHLORIDE 0.9 % IR SOLN
Status: AC
  Filled 2014-11-09: qty 1

## 2014-11-09 MED ORDER — FENTANYL CITRATE (PF) 100 MCG/2ML IJ SOLN
50.0000 ug | INTRAMUSCULAR | Status: DC | PRN
  Administered 2014-11-09: 50 ug via INTRAVENOUS

## 2014-11-09 MED ORDER — METOPROLOL TARTRATE 1 MG/ML IV SOLN
INTRAVENOUS | Status: AC
  Filled 2014-11-09: qty 5

## 2014-11-09 MED ORDER — ENOXAPARIN SODIUM 30 MG/0.3ML ~~LOC~~ SOLN
30.0000 mg | SUBCUTANEOUS | Status: DC
  Administered 2014-11-10 – 2014-11-16 (×7): 30 mg via SUBCUTANEOUS
  Filled 2014-11-09 (×7): qty 0.3

## 2014-11-09 MED ORDER — LOSARTAN POTASSIUM-HCTZ 100-25 MG PO TABS: 1.0000 | ORAL_TABLET | Freq: Every day | ORAL | Status: DC

## 2014-11-09 MED ORDER — LOSARTAN POTASSIUM 50 MG PO TABS
100.0000 mg | ORAL_TABLET | Freq: Every day | ORAL | Status: DC
  Administered 2014-11-10 – 2014-11-16 (×7): 100 mg via ORAL
  Filled 2014-11-09 (×9): qty 2

## 2014-11-09 MED ORDER — FUROSEMIDE 10 MG/ML IJ SOLN
20.0000 mg | Freq: Once | INTRAMUSCULAR | Status: AC
  Administered 2014-11-09: 20 mg via INTRAVENOUS
  Filled 2014-11-09: qty 2

## 2014-11-09 MED ORDER — ALVIMOPAN 12 MG PO CAPS
12.0000 mg | ORAL_CAPSULE | Freq: Two times a day (BID) | ORAL | Status: DC
  Administered 2014-11-10 – 2014-11-13 (×8): 12 mg via ORAL
  Filled 2014-11-09 (×9): qty 1

## 2014-11-09 MED ORDER — ONDANSETRON HCL 4 MG/2ML IJ SOLN
4.0000 mg | Freq: Once | INTRAMUSCULAR | Status: AC
  Administered 2014-11-09: 4 mg via INTRAVENOUS

## 2014-11-09 MED ORDER — PROPOFOL 10 MG/ML IV BOLUS
INTRAVENOUS | Status: AC
  Filled 2014-11-09: qty 20

## 2014-11-09 MED ORDER — TIMOLOL HEMIHYDRATE 0.5 % OP SOLN: 1.0000 [drp] | Freq: Every day | OPHTHALMIC | Status: DC

## 2014-11-09 MED ORDER — HYDRALAZINE HCL 20 MG/ML IJ SOLN
10.0000 mg | INTRAMUSCULAR | Status: DC | PRN
  Administered 2014-11-09 – 2014-11-15 (×13): 10 mg via INTRAVENOUS
  Filled 2014-11-09 (×13): qty 1

## 2014-11-09 MED ORDER — INSULIN ASPART 100 UNIT/ML ~~LOC~~ SOLN
0.0000 [IU] | SUBCUTANEOUS | Status: DC
  Administered 2014-11-09: 3 [IU] via SUBCUTANEOUS
  Administered 2014-11-10 – 2014-11-13 (×12): 2 [IU] via SUBCUTANEOUS

## 2014-11-09 MED ORDER — NEOSTIGMINE METHYLSULFATE 10 MG/10ML IV SOLN
INTRAVENOUS | Status: DC | PRN
Start: 2014-11-09 — End: 2014-11-09
  Administered 2014-11-09: 4 mg via INTRAVENOUS

## 2014-11-09 MED ORDER — POLYMYXIN B SULFATE 500000 UNITS IJ SOLR
INTRAMUSCULAR | Status: DC | PRN
  Administered 2014-11-09: 500 mL

## 2014-11-09 MED ORDER — 0.9 % SODIUM CHLORIDE (POUR BTL) OPTIME
TOPICAL | Status: DC | PRN
  Administered 2014-11-09: 4000 mL

## 2014-11-09 MED ORDER — SUCCINYLCHOLINE CHLORIDE 20 MG/ML IJ SOLN
INTRAMUSCULAR | Status: DC | PRN
Start: 2014-11-09 — End: 2014-11-09
  Administered 2014-11-09: 100 mg via INTRAVENOUS

## 2014-11-09 MED ORDER — ROCURONIUM BROMIDE 100 MG/10ML IV SOLN
INTRAVENOUS | Status: DC | PRN
  Administered 2014-11-09: 10 mg via INTRAVENOUS
  Administered 2014-11-09: 40 mg via INTRAVENOUS
  Administered 2014-11-09: 10 mg via INTRAVENOUS

## 2014-11-09 MED ORDER — LIDOCAINE HCL (CARDIAC) 20 MG/ML IV SOLN
INTRAVENOUS | Status: DC | PRN
  Administered 2014-11-09: 50 mg via INTRAVENOUS

## 2014-11-09 MED ORDER — GLYCOPYRROLATE 0.2 MG/ML IJ SOLN
INTRAMUSCULAR | Status: DC | PRN
  Administered 2014-11-09: 0.6 mg via INTRAVENOUS

## 2014-11-09 MED ORDER — LABETALOL HCL 5 MG/ML IV SOLN
5.0000 mg | INTRAVENOUS | Status: AC | PRN
  Administered 2014-11-09 (×4): 5 mg via INTRAVENOUS

## 2014-11-09 MED ORDER — PROPOFOL 10 MG/ML IV BOLUS
INTRAVENOUS | Status: DC | PRN
  Administered 2014-11-09: 140 mg via INTRAVENOUS

## 2014-11-09 MED ORDER — LACTATED RINGERS IV SOLN
INTRAVENOUS | Status: DC
  Administered 2014-11-09 (×2): via INTRAVENOUS
  Administered 2014-11-09: 1000 mL via INTRAVENOUS

## 2014-11-09 MED ORDER — GLYCOPYRROLATE 0.2 MG/ML IJ SOLN
INTRAMUSCULAR | Status: AC
  Filled 2014-11-09: qty 3

## 2014-11-09 MED ORDER — DILTIAZEM HCL ER 120 MG PO CP24
120.0000 mg | ORAL_CAPSULE | Freq: Every morning | ORAL | Status: DC
  Administered 2014-11-10 – 2014-11-16 (×7): 120 mg via ORAL
  Filled 2014-11-09 (×10): qty 1

## 2014-11-09 MED ORDER — FENTANYL CITRATE (PF) 100 MCG/2ML IJ SOLN
INTRAMUSCULAR | Status: DC | PRN
  Administered 2014-11-09 (×4): 100 ug via INTRAVENOUS
  Administered 2014-11-09 (×2): 50 ug via INTRAVENOUS

## 2014-11-09 SURGICAL SUPPLY — 54 items
APPLICATOR COTTON TIP 6IN STRL (MISCELLANEOUS) ×6 IMPLANT
BLADE EXTENDED COATED 6.5IN (ELECTRODE) IMPLANT
BLADE HEX COATED 2.75 (ELECTRODE) ×3 IMPLANT
BLADE SURG SZ10 CARB STEEL (BLADE) ×3 IMPLANT
COVER MAYO STAND STRL (DRAPES) ×6 IMPLANT
COVER SURGICAL LIGHT HANDLE (MISCELLANEOUS) ×6 IMPLANT
DRAPE LAPAROSCOPIC ABDOMINAL (DRAPES) ×3 IMPLANT
DRAPE SHEET LG 3/4 BI-LAMINATE (DRAPES) ×3 IMPLANT
DRAPE UTILITY XL STRL (DRAPES) ×6 IMPLANT
DRAPE WARM FLUID 44X44 (DRAPE) ×3 IMPLANT
DRSG OPSITE POSTOP 4X10 (GAUZE/BANDAGES/DRESSINGS) ×3 IMPLANT
DRSG OPSITE POSTOP 4X6 (GAUZE/BANDAGES/DRESSINGS) ×3 IMPLANT
DRSG OPSITE POSTOP 4X8 (GAUZE/BANDAGES/DRESSINGS) IMPLANT
ELECT PENCIL ROCKER SW 15FT (MISCELLANEOUS) ×3 IMPLANT
ELECT REM PT RETURN 9FT ADLT (ELECTROSURGICAL) ×3
ELECTRODE REM PT RTRN 9FT ADLT (ELECTROSURGICAL) ×1 IMPLANT
GAUZE SPONGE 4X4 12PLY STRL (GAUZE/BANDAGES/DRESSINGS) ×3 IMPLANT
GLOVE BIOGEL PI IND STRL 7.5 (GLOVE) ×2 IMPLANT
GLOVE BIOGEL PI INDICATOR 7.5 (GLOVE) ×4
GLOVE ECLIPSE 7.5 STRL STRAW (GLOVE) ×6 IMPLANT
GOWN STRL REUS W/TWL XL LVL3 (GOWN DISPOSABLE) ×18 IMPLANT
KIT BASIN OR (CUSTOM PROCEDURE TRAY) ×3 IMPLANT
LEGGING LITHOTOMY PAIR STRL (DRAPES) ×3 IMPLANT
LIGASURE IMPACT 36 18CM CVD LR (INSTRUMENTS) IMPLANT
NS IRRIG 1000ML POUR BTL (IV SOLUTION) ×6 IMPLANT
PACK GENERAL/GYN (CUSTOM PROCEDURE TRAY) ×3 IMPLANT
SPONGE LAP 18X18 X RAY DECT (DISPOSABLE) ×3 IMPLANT
STAPLER CIRC CVD 29MM 37CM (STAPLE) ×3 IMPLANT
STAPLER CUT CVD 40MM BLUE (STAPLE) ×3 IMPLANT
STAPLER VISISTAT 35W (STAPLE) ×3 IMPLANT
SUCTION POOLE TIP (SUCTIONS) ×3 IMPLANT
SUT NOV 1 T60/GS (SUTURE) IMPLANT
SUT NOVA 1 T20/GS 25DT (SUTURE) ×6 IMPLANT
SUT NOVA NAB DX-16 0-1 5-0 T12 (SUTURE) IMPLANT
SUT NOVA T20/GS 25 (SUTURE) IMPLANT
SUT PDS AB 0 CT1 36 (SUTURE) ×6 IMPLANT
SUT PDS AB 1 CTX 36 (SUTURE) IMPLANT
SUT PDS AB 1 TP1 96 (SUTURE) ×6 IMPLANT
SUT PROLENE 2 0 KS (SUTURE) ×3 IMPLANT
SUT SILK 2 0 (SUTURE) ×2
SUT SILK 2 0 SH CR/8 (SUTURE) ×3 IMPLANT
SUT SILK 2 0SH CR/8 30 (SUTURE) IMPLANT
SUT SILK 2-0 18XBRD TIE 12 (SUTURE) ×1 IMPLANT
SUT SILK 2-0 30XBRD TIE 12 (SUTURE) IMPLANT
SUT SILK 3 0 (SUTURE) ×4
SUT SILK 3 0 SH CR/8 (SUTURE) ×3 IMPLANT
SUT SILK 3-0 18XBRD TIE 12 (SUTURE) ×2 IMPLANT
SUT VIC AB 3-0 54XBRD REEL (SUTURE) IMPLANT
SUT VIC AB 3-0 BRD 54 (SUTURE)
TOWEL OR 17X26 10 PK STRL BLUE (TOWEL DISPOSABLE) ×6 IMPLANT
TOWEL OR NON WOVEN STRL DISP B (DISPOSABLE) ×6 IMPLANT
TRAY FOLEY W/METER SILVER 14FR (SET/KITS/TRAYS/PACK) ×3 IMPLANT
TRAY FOLEY W/METER SILVER 16FR (SET/KITS/TRAYS/PACK) IMPLANT
YANKAUER SUCT BULB TIP 10FT TU (MISCELLANEOUS) ×3 IMPLANT

## 2014-11-09 NOTE — Transfer of Care (Signed)
Immediate Anesthesia Transfer of Care Note  Patient: Kimberly Castillo  Procedure(s) Performed: Procedure(s): TAKEDOWN HARTMAN COLOSTOMY (N/A)  Patient Location: PACU  Anesthesia Type:General  Level of Consciousness: sedated, patient cooperative and responds to stimulation  Airway & Oxygen Therapy: Patient Spontanous Breathing and Patient connected to face mask oxygen  Post-op Assessment: Report given to RN and Post -op Vital signs reviewed and stable  Post vital signs: Reviewed and stable  Last Vitals:  Filed Vitals:   11/09/14 1511  BP:   Pulse: 86  Temp:   Resp:     Complications: No apparent anesthesia complications

## 2014-11-09 NOTE — Op Note (Signed)
Preoperative Diagnosis: colostomy status, Diverticulosis  Postoprative Diagnosis: colostomy status, Diverticulosis  Procedure: Procedure(s): TAKEDOWN HARTMAN COLOSTOMY, Partial colectomy, takedown of splenic flexure   Surgeon: Excell Seltzer T   Assistants: Alphonsa Overall  Anesthesia:  General endotracheal anesthesia  Indications: Patient is a 81 year old female 1 year status post emergency sigmoid Hartmann colectomy for perforated diverticulitis. She has made a good recovery and desires colostomy takedown. I have had a number of discussions with the patient regarding the surgery and risks involved and extensive nature of the surgery and options for keeping her colostomy. She is adamant that she wants her colostomy reversed. Following a mechanical and antibiotic bowel prep at home and following extensive discussion regarding surgery and wrist detail elsewhere she is brought to the operating room for this procedure    Procedure Detail:  Patient was brought to the operating room, placed in the supine position on the operating table, and general endotracheal anesthesia induced. She received preoperative IV antibiotics. PAS replaced. Foley catheter was placed. She was carefully positioned padded in semi-lithotomy position. The abdomen and perineum were widely sterilely prepped and draped. Patient timeout was performed and correct procedure verified. The previous midline incision skirting the umbilicus was used And dissection carried down through the simultaneous tissue to the fascia. There was a small incisional hernia that was opened in the superior part of the wound and the peritoneal cavity opened. The remainder of the fascia  And peritoneum were opened with cautery. There were filmy small bowel adhesions but these were not severe and these were all taken down from the abdominal wall and the small bowel was brought out of the pelvis without any undue difficulty. The small bowel was packed in the  upper abdomen in the pelvis exposed with a Balfour retractor. The stump of the rectosigmoid was easily identified. Imaging showed some stenosis and diverticulosis of the proximal Hartmann pouch. This was also noted grossly. I localized the left ureter and this was protected. We then began to mobilize the rectosigmoid distally dividing peritoneum on either side and dividing the mesentery with the Harmonic scalpel. Approximately  10-12 cm of rectosigmoid was mobilized down to the peritoneal reflection and then just below the peritoneal reflection the rectum was of larger caliber and soft. This was cleaned of perirectal fat and mesentery and divided with a single firing of the blue load contour stapler. This portion of the colon was sent for permanent pathology. The staple line was intact and without bleeding. Attention was turned to taking the colostomy down. The colostomy was exposed and omental adhesions taken down from around it. It was mobilized up into the anterior abdominal wall. An elliptical skin incision transversely oriented was made around the stoma and dissection carried down into the subcutaneous tissue and the stoma mobilized down to the level of the fascia, adhesions to the fascia divided and the end of the colon mobilized into the abdominal cavity. There was some mild diverticulosis of the left colon. In order to obtain mobility into the pelvis particularly as we taken the rectum fairly low in the pelvis the left colon was completely mobilized dividing lateral peritoneal attachments and the splenic flexure was exposed and mobilized with the Harmonic scalpel. The omentum was dissected up off of the distal transverse colon and at this point the mid transverse colon was quite mobile down into the pelvis. A healthy area of transverse colon was chosen and was cleaned of adherent pericolic fat. Further mesentery was divided distally freeing the entire mesentery to  this point of division. The pursestring  clamp was applied and a 2-0 Prolene pursestring suture placed and the towel divided and removed. We also noted a firm area in the mid left colon possibly scarring or previous diverticulitis but could not rule out tumor and this was completely resected by mobilizing the splenic flexure and taking the resection up to the mid transverse colon. The pursestring suture was intact and the end of the colon was sized to 29 mm. Following that Dr. Lucia Gaskins went below and the rectum was easily traversed with the 29 mm dilator and the 29 mm EEA stapler was advanced to the staple line and the spike deployed just anterior to the staple line under direct vision. The anvil was hooked and the stapler closed under direct vision excluding any extraneous tissue. The stapler was fired and removed. Donuts were examined and were intact.  Dr. Lucia Gaskins and performed proctoscopy and examination anastomosis and it was tensely insufflated clamping the proximal colon and after saline irrigation there was no evidence of leak. The anastomosis was at 13 cm. Following this all instruments gloves and gowns and drapes were changed. The abdomen was thoroughly irrigated. The small bowel was run from ligament of Treitz to the ileocecal valve. The transverse colon lay in the left posterior abdomen under no tension. There was no bleeding. A small hernia sac was excised from the colostomy site. A small hernia sac was excised from the upper midline incision in the fascia mobilized a short distance to allow closure. The colostomy fascial defect was closed with running 0 PDS suture with several interrupted #1 Novafil sutures. The midline fascia was closed with running looped 0 PDS with intermittent interrupted #1 Novafil sutures. Subcutaneous tissue was irrigated with anabolic solution and skin closed with staples. Sponge needle and instrument counts were correct.    Findings: As above  Estimated Blood Loss:  150 mL's         Drains: none  Blood Given:  none          Specimens: Rectosigmoid colon, left colon with colostomy        Complications:  * No complications entered in OR log *         Disposition: PACU - hemodynamically stable.         Condition: stable

## 2014-11-09 NOTE — Anesthesia Preprocedure Evaluation (Signed)
Anesthesia Evaluation  Patient identified by MRN, date of birth, ID band Patient awake    Reviewed: Allergy & Precautions, H&P , NPO status , Patient's Chart, lab work & pertinent test results  Airway Mallampati: II  TM Distance: >3 FB Neck ROM: Full    Dental  (+) Upper Dentures, Dental Advisory Given   Pulmonary neg pulmonary ROS, former smoker,    Pulmonary exam normal breath sounds clear to auscultation       Cardiovascular hypertension, Pt. on medications Normal cardiovascular exam+ dysrhythmias Atrial Fibrillation  Rhythm:Irregular Rate:Tachycardia     Neuro/Psych glaucoma negative neurological ROS  negative psych ROS   GI/Hepatic negative GI ROS, Neg liver ROS, GERD  Medicated and Controlled,  Endo/Other  diabetes, Well Controlled, Type 2, Oral Hypoglycemic Agents  Renal/GU negative Renal ROS  negative genitourinary   Musculoskeletal negative musculoskeletal ROS (+)   Abdominal   Peds negative pediatric ROS (+)  Hematology negative hematology ROS (+) anemia , hgb 10.9   Anesthesia Other Findings   Reproductive/Obstetrics negative OB ROS                             Anesthesia Physical Anesthesia Plan  ASA: III  Anesthesia Plan: General   Post-op Pain Management:    Induction: Intravenous  Airway Management Planned: Oral ETT  Additional Equipment:   Intra-op Plan:   Post-operative Plan: Extubation in OR  Informed Consent:   Plan Discussed with: Surgeon  Anesthesia Plan Comments:         Anesthesia Quick Evaluation

## 2014-11-09 NOTE — H&P (Signed)
History of Present Illness  Patient words: discuss surgery.  The patient is a 81 year old female presenting for a post-operative visit. She returns now 12 months following emergency Hartmann colectomy for sigmoid colon perforation with diverticulosis and severe constipation. She reports no problems. We got a barium enema through her rectum and colostomy that showed a reasonably long rectosigmoid stump and no complicating factors. She has not had any significant acute illnesses. She strongly wants her colostomy reversed.  Past Medical History  Diagnosis Date  . Glaucoma   . Diabetes mellitus ([place])   . Hypertension   . Hyperlipidemia   . Arthritis     hands, back  . History of shingles   . Cataracts, bilateral   . A-fib ([place])   . Post herpetic neuralgia 12/19/2012  . Chronic lower back pain 12/19/2012  . Arthritis, lumbar spine 12/19/2012  . Anxiety state 12/22/2013  . Heart murmur   . Headache     hx migraines  . Numbness     rt side of rt leg  . Dysrhythmia   . Diverticulitis    Past Surgical History  Procedure Laterality Date  . Incision and drainage breast abscess  1958    left  . Abdominal hysterectomy  1964    partial  . Colon resection N/A 09/02/2013    Procedure: [place] colectomy;  Surgeon: Excell Seltzer, MD;  Location: [facility];  Service: General;  Laterality: N/A;     Medication History  Optivar (0.05% Solution, Ophthalmic) Active. Keflex (500MG  Capsule, Oral) Active. Valium (5MG  Tablet, Oral) Active. Dilacor XR (120MG  Capsule ER 24HR, Oral) Active. Diflucan (150MG  Tablet, Oral) Active. HydrOXYzine HCl (25MG  Tablet, Oral) Active. Xalatan (0.005% Solution, Ophthalmic) Active. Betagan (0.5% Solution, Ophthalmic) Active. Glucophage (500MG  Tablet, Oral) Active. Pamelor (10MG  Capsule, Oral) Active. Protonix (20MG  Tablet DR, Oral) Active. Potassium & Sodium Phosphates (278-164-250MG  Packet, Oral) Active. Zocor (40MG  Tablet, Oral)  Active. Valtrex (500MG  Tablet, Oral) Active. Ambien (5MG  Tablet, Oral) Active. Medications Reconciled  BP 146/82 mmHg  Pulse 112  Temp(Src) 98.7 F (37.1 C) (Oral)  Resp 18  Ht 5\' 2"  (1.575 m)  Wt 73.936 kg (163 lb)  BMI 29.81 kg/m2  SpO2 100%      Physical Exam  The physical exam findings are as follows: Note:General: Alert, well-developed African-American female who appears younger than her stated age, in no distress Skin: Warm and dry without rash or infection. HEENT: No palpable masses or thyromegaly. Sclera nonicteric. Lymph nodes: No cervical, supraclavicular, or inguinal nodes palpable. Lungs: Breath sounds clear and equal. No wheezing or increased work of breathing. Cardiovascular: Regular rate and rhythm without murmer. No JVD or edema. Abdomen: Colostomy present left lower quadrant. Nondistended. Soft and nontender. No masses palpable. No organomegaly. No palpable hernias. Well-healed low midline incision Extremities: No edema or joint swelling or deformity. No chronic venous stasis changes. Neurologic: Alert and fully oriented. Gait normal. No focal weakness. Psychiatric: Normal mood and affect. Thought content appropriate with normal judgement and insight    Assessment & Plan  COLOSTOMY STATUS (Z93.3) Impression: 12 months following Hartmann colectomy for perforated diverticulitis. I again had a long discussion with the patient as well as her son today regarding pros and cons of colostomy takedown. I discussed with her that this was not absolutely necessary for her health and somewhat elective. I discussed the nature of surgery and expected recovery as well as potential risks of anesthetic complications, bleeding, infection, anastomotic leakage requiring repeat colostomy, severe adhesions that would prevent reversal  and possible death. She understands all this very well and feels strongly that she wants the colostomy reversed. I do not think this is  unreasonable. She is given a prescription for bowel prep and antibiotics today. All of her and her son's questions were answered  Current Plans  Schedule for Surgery Takedown of Hartmann colostomy

## 2014-11-09 NOTE — Anesthesia Procedure Notes (Signed)
Procedure Name: Intubation Performed by: Gean Maidens Pre-anesthesia Checklist: Patient identified, Emergency Drugs available, Suction available, Patient being monitored and Timeout performed Patient Re-evaluated:Patient Re-evaluated prior to inductionOxygen Delivery Method: Circle system utilized Preoxygenation: Pre-oxygenation with 100% oxygen Intubation Type: IV induction and Rapid sequence Laryngoscope Size: Mac and 3 Grade View: Grade I Tube type: Oral Tube size: 7.0 mm Number of attempts: 1 Airway Equipment and Method: Stylet Placement Confirmation: ETT inserted through vocal cords under direct vision,  positive ETCO2,  CO2 detector and breath sounds checked- equal and bilateral Secured at: 22 cm Tube secured with: Tape

## 2014-11-09 NOTE — Anesthesia Postprocedure Evaluation (Signed)
  Anesthesia Post-op Note  Patient: Kimberly Castillo  Procedure(s) Performed: Procedure(s) (LRB): TAKEDOWN HARTMAN COLOSTOMY (N/A)  Patient Location: PACU  Anesthesia Type: General  Level of Consciousness: awake and alert   Airway and Oxygen Therapy: Patient Spontanous Breathing  Post-op Pain: mild  Post-op Assessment: Post-op Vital signs reviewed, Patient's Cardiovascular Status Stable, Respiratory Function Stable, Patent Airway and No signs of Nausea or vomiting  Last Vitals:  Filed Vitals:   11/09/14 1955  BP: 170/94  Pulse: 78  Temp:   Resp: 21    Post-op Vital Signs: stable   Complications: No apparent anesthesia complications

## 2014-11-09 NOTE — Interval H&P Note (Signed)
History and Physical Interval Note:  11/09/2014 3:00 PM  Kimberly Castillo  has presented today for surgery, with the diagnosis of colostomy status  The various methods of treatment have been discussed with the patient and family. After consideration of risks, benefits and other options for treatment, the patient has consented to  Procedure(s): TAKEDOWN HARTMAN COLOSTOMY (N/A) as a surgical intervention .  The patient's history has been reviewed, patient examined, no change in status, stable for surgery.  I have reviewed the patient's chart and labs.  Questions were answered to the patient's satisfaction.     Britne Borelli T

## 2014-11-10 ENCOUNTER — Encounter (HOSPITAL_COMMUNITY): Payer: Self-pay | Admitting: General Surgery

## 2014-11-10 LAB — GLUCOSE, CAPILLARY
GLUCOSE-CAPILLARY: 104 mg/dL — AB (ref 65–99)
GLUCOSE-CAPILLARY: 140 mg/dL — AB (ref 65–99)
Glucose-Capillary: 127 mg/dL — ABNORMAL HIGH (ref 65–99)
Glucose-Capillary: 103 mg/dL — ABNORMAL HIGH (ref 65–99)
Glucose-Capillary: 123 mg/dL — ABNORMAL HIGH (ref 65–99)

## 2014-11-10 LAB — CBC
HCT: 34.7 % — ABNORMAL LOW (ref 36.0–46.0)
Hemoglobin: 11.5 g/dL — ABNORMAL LOW (ref 12.0–15.0)
MCH: 32.1 pg (ref 26.0–34.0)
MCHC: 33.1 g/dL (ref 30.0–36.0)
MCV: 96.9 fL (ref 78.0–100.0)
PLATELETS: 227 10*3/uL (ref 150–400)
RBC: 3.58 MIL/uL — ABNORMAL LOW (ref 3.87–5.11)
RDW: 15.4 % (ref 11.5–15.5)
WBC: 11.7 10*3/uL — ABNORMAL HIGH (ref 4.0–10.5)

## 2014-11-10 LAB — BASIC METABOLIC PANEL
Anion gap: 7 (ref 5–15)
BUN: 17 mg/dL (ref 6–20)
CALCIUM: 8.5 mg/dL — AB (ref 8.9–10.3)
CO2: 26 mmol/L (ref 22–32)
CREATININE: 1.43 mg/dL — AB (ref 0.44–1.00)
Chloride: 105 mmol/L (ref 101–111)
GFR calc Af Amer: 39 mL/min — ABNORMAL LOW (ref >60)
GFR calc non Af Amer: 34 mL/min — ABNORMAL LOW (ref >60)
GLUCOSE: 129 mg/dL — AB (ref 65–99)
Potassium: 3.4 mmol/L — ABNORMAL LOW (ref 3.5–5.1)
Sodium: 138 mmol/L (ref 135–145)

## 2014-11-10 MED ORDER — CETYLPYRIDINIUM CHLORIDE 0.05 % MT LIQD
7.0000 mL | Freq: Two times a day (BID) | OROMUCOSAL | Status: DC
  Administered 2014-11-10 – 2014-11-15 (×8): 7 mL via OROMUCOSAL

## 2014-11-10 MED ORDER — CHLORHEXIDINE GLUCONATE 0.12 % MT SOLN
15.0000 mL | Freq: Two times a day (BID) | OROMUCOSAL | Status: DC
  Administered 2014-11-10 – 2014-11-16 (×13): 15 mL via OROMUCOSAL
  Filled 2014-11-10 (×11): qty 15

## 2014-11-10 NOTE — Care Management Note (Signed)
Case Management Note  Patient Details  Name: Kimberly Castillo MRN: [id number] Date of Birth: 10/23/1933  Subjective/Objective:    hartsmanns pouch and colectomy                Action/Plan:Date: November 10, 2014 Chart reviewed for concurrent status and case management needs. Will continue to follow patient for changes and needs: Velva Harman, RN, BSN, [place]   [phone]   Expected Discharge Date:                  Expected Discharge Plan:  Home/Self Care  In-House Referral:  NA  Discharge planning Services  CM Consult  Post Acute Care Choice:    Choice offered to:     DME Arranged:    DME Agency:     HH Arranged:    HH Agency:     Status of Service:  In process, will continue to follow  Medicare Important Message Given:    Date Medicare IM Given:    Medicare IM give by:    Date Additional Medicare IM Given:    Additional Medicare Important Message give by:     If discussed at [place] of Stay Meetings, dates discussed:    Additional Comments:  Leeroy Cha, RN 11/10/2014, 11:04 AM

## 2014-11-10 NOTE — Progress Notes (Signed)
Patient ID: Kimberly Castillo, female   DOB: 1933/04/18, 81 y.o.   MRN: [id number] 1 Day Post-Op  Subjective: Some incisional pain but controlled with medications. Denies nausea.  Objective: Vital signs in last 24 hours: Temp:  [97.2 F (36.2 C)-100.6 F (38.1 C)] 99.5 F (37.5 C) (11/09 0800) Pulse Rate:  [60-115] 61 (11/09 0800) Resp:  [11-25] 13 (11/09 1000) BP: (113-239)/(35-122) 147/67 mmHg (11/09 0800) SpO2:  [91 %-100 %] 92 % (11/09 1000) Weight:  [73.936 kg (163 lb)-79.9 kg (176 lb 2.4 oz)] 79.9 kg (176 lb 2.4 oz) (11/09 0000)    Intake/Output from previous day: 11/08 0701 - 11/09 0700 In: 3640 [I.V.:3640] Out: 1425 [Urine:1225; Blood:200] Intake/Output this shift: Total I/O In: 400 [I.V.:400] Out: -   General appearance: alert, cooperative and mild distress  Lungs: Clear breath sounds without increased work of breathing Abdomen: Soft and nondistended. Dressings dry. Appropriate incisional tenderness.  Lab Results:   Recent Labs  11/10/14 0330  WBC 11.7*  HGB 11.5*  HCT 34.7*  PLT 227   BMET  Recent Labs  11/10/14 0330  NA 138  K 3.4*  CL 105  CO2 26  GLUCOSE 129*  BUN 17  CREATININE 1.43*  CALCIUM 8.5*   CBG (last 3)   Recent Labs  11/10/14 0016 11/10/14 0356 11/10/14 0742  GLUCAP 140* 127* 104*      Studies/Results: No results found.  Anti-infectives: Anti-infectives    Start     Dose/Rate Route Frequency Ordered Stop   11/09/14 1821  polymyxin B 500,000 Units, bacitracin 50,000 Units in sodium chloride irrigation 0.9 % 500 mL irrigation  Status:  Discontinued       As needed 11/09/14 1821 11/09/14 1857   11/09/14 1140  cefoTEtan (CEFOTAN) 2 g in dextrose 5 % 50 mL IVPB     2 g 100 mL/hr over 30 Minutes Intravenous On call to O.R. 11/09/14 1140 11/09/14 1606      Assessment/Plan: s/p Procedure(s): TAKEDOWN HARTMAN COLOSTOMY Stable postoperatively without apparent complication Diabetes mellitus-sugar well-controlled on sliding  scale insulin Chronic renal insufficiency-good urine output-continue to monitor Out of bed today   LOS: 1 day    Kalei Mckillop T 11/10/2014

## 2014-11-11 ENCOUNTER — Inpatient Hospital Stay (HOSPITAL_COMMUNITY): Payer: Medicare Other

## 2014-11-11 LAB — BASIC METABOLIC PANEL
Anion gap: 6 (ref 5–15)
BUN: 24 mg/dL — ABNORMAL HIGH (ref 6–20)
CALCIUM: 8.5 mg/dL — AB (ref 8.9–10.3)
CO2: 27 mmol/L (ref 22–32)
CREATININE: 1.4 mg/dL — AB (ref 0.44–1.00)
Chloride: 109 mmol/L (ref 101–111)
GFR calc non Af Amer: 35 mL/min — ABNORMAL LOW (ref >60)
GFR, EST AFRICAN AMERICAN: 40 mL/min — AB (ref >60)
GLUCOSE: 147 mg/dL — AB (ref 65–99)
Potassium: 3.7 mmol/L (ref 3.5–5.1)
Sodium: 142 mmol/L (ref 135–145)

## 2014-11-11 LAB — CBC
HEMATOCRIT: 31.1 % — AB (ref 36.0–46.0)
Hemoglobin: 10.2 g/dL — ABNORMAL LOW (ref 12.0–15.0)
MCH: 32.1 pg (ref 26.0–34.0)
MCHC: 32.8 g/dL (ref 30.0–36.0)
MCV: 97.8 fL (ref 78.0–100.0)
PLATELETS: 197 10*3/uL (ref 150–400)
RBC: 3.18 MIL/uL — ABNORMAL LOW (ref 3.87–5.11)
RDW: 16.1 % — AB (ref 11.5–15.5)
WBC: 12.2 10*3/uL — ABNORMAL HIGH (ref 4.0–10.5)

## 2014-11-11 LAB — GLUCOSE, CAPILLARY
GLUCOSE-CAPILLARY: 109 mg/dL — AB (ref 65–99)
GLUCOSE-CAPILLARY: 118 mg/dL — AB (ref 65–99)
GLUCOSE-CAPILLARY: 136 mg/dL — AB (ref 65–99)
GLUCOSE-CAPILLARY: 139 mg/dL — AB (ref 65–99)
Glucose-Capillary: 138 mg/dL — ABNORMAL HIGH (ref 65–99)
Glucose-Capillary: 127 mg/dL — ABNORMAL HIGH (ref 65–99)
Glucose-Capillary: 143 mg/dL — ABNORMAL HIGH (ref 65–99)
Glucose-Capillary: 126 mg/dL — ABNORMAL HIGH (ref 65–99)

## 2014-11-11 NOTE — Progress Notes (Signed)
Patient ID: Kimberly Castillo, female   DOB: 1933/09/28, 81 y.o.   MRN: [id number] 2 Days Post-Op  Subjective: No major complaints this morning.Incisional pain when moving about. Denies nausea.Was out of bed quite a bit yesterday. Denies shortness of breath or chest pain.  Objective: Vital signs in last 24 hours: Temp:  [98.7 F (37.1 C)-99.7 F (37.6 C)] 99.3 F (37.4 C) (11/10 0320) Pulse Rate:  [61-124] 95 (11/10 0600) Resp:  [11-27] 19 (11/10 0600) BP: (113-196)/(40-116) 119/40 mmHg (11/10 0600) SpO2:  [92 %-100 %] 100 % (11/10 0600) FiO2 (%):  [55 %] 55 % (11/10 0048) Weight:  [77.8 kg (171 lb 8.3 oz)] 77.8 kg (171 lb 8.3 oz) (11/10 0320)    Intake/Output from previous day: 11/09 0701 - 11/10 0700 In: 2400 [I.V.:2400] Out: 1580 [Urine:1580] Intake/Output this shift: Total I/O In: 1200 [I.V.:1200] Out: 1155 [Urine:1155]  General appearance: alert, cooperative and no distress Resp: clear to auscultation bilaterally GI: nondistended. Active bowel sounds. Minimal appropriate incisional tenderness. Incision/Wound: no drainage or erythema  Lab Results:   Recent Labs  11/10/14 0330 11/11/14 0438  WBC 11.7* 12.2*  HGB 11.5* 10.2*  HCT 34.7* 31.1*  PLT 227 197   BMET  Recent Labs  11/10/14 0330 11/11/14 0438  NA 138 142  K 3.4* 3.7  CL 105 109  CO2 26 27  GLUCOSE 129* 147*  BUN 17 24*  CREATININE 1.43* 1.40*  CALCIUM 8.5* 8.5*   CBG (last 3)   Recent Labs  11/10/14 1948 11/10/14 2309 11/11/14 0322  GLUCAP 123* 127* 143*      Studies/Results: Dg Chest Port 1 View  11/11/2014  CLINICAL DATA:  Increasing shortness of breath.  Productive cough EXAM: PORTABLE CHEST 1 VIEW COMPARISON:  09/01/2013 FINDINGS: Mild hypoventilation with subsegmental atelectasis at the left base. No edema, effusion, or consolidative opacity. Chronic borderline cardiomegaly. Stable aortic and hilar contours. IMPRESSION: Hypoventilation with mild left basilar atelectasis.  Electronically Signed   By: Monte Fantasia M.D.   On: 11/11/2014 05:21    Anti-infectives: Anti-infectives    Start     Dose/Rate Route Frequency Ordered Stop   11/09/14 1821  polymyxin B 500,000 Units, bacitracin 50,000 Units in sodium chloride irrigation 0.9 % 500 mL irrigation  Status:  Discontinued       As needed 11/09/14 1821 11/09/14 1857   11/09/14 1140  cefoTEtan (CEFOTAN) 2 g in dextrose 5 % 50 mL IVPB     2 g 100 mL/hr over 30 Minutes Intravenous On call to O.R. 11/09/14 1140 11/09/14 1606      Assessment/Plan: s/p Procedure(s): TAKEDOWN HARTMAN COLOSTOMY Stable postoperatively. Transfer to telemetry floor. Start clear liquid diet. Activity encouraged. Diabetes mellitus-stable on sliding scale insulin Chronic renal insufficiency-good urine output, creatinine stable    LOS: 2 days    Quinnton Bury T 11/11/2014

## 2014-11-11 NOTE — Progress Notes (Signed)
Received patient from [facility] ICU. VSS. Patient in pain from movement from bed to bed. Will continue to monitor.

## 2014-11-11 NOTE — Plan of Care (Signed)
Problem: Activity: Goal: Ability to return to normal activity level will improve Outcome: Progressing Pt is reluctant to get out of bed but did get up to chair today with assistance.

## 2014-11-12 LAB — GLUCOSE, CAPILLARY
GLUCOSE-CAPILLARY: 93 mg/dL (ref 65–99)
GLUCOSE-CAPILLARY: 134 mg/dL — AB (ref 65–99)
Glucose-Capillary: 110 mg/dL — ABNORMAL HIGH (ref 65–99)
Glucose-Capillary: 85 mg/dL (ref 65–99)
Glucose-Capillary: 114 mg/dL — ABNORMAL HIGH (ref 65–99)

## 2014-11-12 LAB — CBC
HEMATOCRIT: 30.2 % — AB (ref 36.0–46.0)
HEMOGLOBIN: 9.7 g/dL — AB (ref 12.0–15.0)
MCH: 32.1 pg (ref 26.0–34.0)
MCHC: 32.1 g/dL (ref 30.0–36.0)
MCV: 100 fL (ref 78.0–100.0)
Platelets: 212 10*3/uL (ref 150–400)
RBC: 3.02 MIL/uL — ABNORMAL LOW (ref 3.87–5.11)
RDW: 16.5 % — AB (ref 11.5–15.5)
WBC: 10.5 10*3/uL (ref 4.0–10.5)

## 2014-11-12 LAB — BASIC METABOLIC PANEL
ANION GAP: 4 — AB (ref 5–15)
BUN: 26 mg/dL — ABNORMAL HIGH (ref 6–20)
CALCIUM: 8.6 mg/dL — AB (ref 8.9–10.3)
CHLORIDE: 111 mmol/L (ref 101–111)
CO2: 25 mmol/L (ref 22–32)
CREATININE: 1.16 mg/dL — AB (ref 0.44–1.00)
GFR calc non Af Amer: 44 mL/min — ABNORMAL LOW (ref >60)
GFR, EST AFRICAN AMERICAN: 51 mL/min — AB (ref >60)
Glucose, Bld: 99 mg/dL (ref 65–99)
Potassium: 3.8 mmol/L (ref 3.5–5.1)
SODIUM: 140 mmol/L (ref 135–145)

## 2014-11-12 NOTE — Progress Notes (Signed)
Patient ID: Kimberly Castillo, female   DOB: July 26, 1933, 81 y.o.   MRN: [id number] 3 Days Post-Op  Subjective: Pain around incision when moving, otherwise OK.  No CP or SOB. Slight nausea earlier today, none now. Tol CL without difficulty. Small amt blood per rectum noted this AM  Objective: Vital signs in last 24 hours: Temp:  [98.5 F (36.9 C)-98.8 F (37.1 C)] 98.5 F (36.9 C) (11/11 0359) Pulse Rate:  [83-103] 83 (11/11 0359) Resp:  [18-22] 18 (11/11 0359) BP: (145-172)/(63-68) 172/63 mmHg (11/11 0359) SpO2:  [97 %-100 %] 100 % (11/11 0359) Last BM Date: 11/09/14  Intake/Output from previous day: 11/10 0701 - 11/11 0700 In: 1005 [P.O.:480; I.V.:525] Out: 775 [Urine:775] Intake/Output this shift:    General appearance: alert, cooperative and no distress Resp: clear to auscultation bilaterally GI: Mild appropriate tenderness Incision/Wound: Dressings clean and dry, no erythema  Lab Results:   Recent Labs  11/11/14 0438 11/12/14 0557  WBC 12.2* 10.5  HGB 10.2* 9.7*  HCT 31.1* 30.2*  PLT 197 212   BMET  Recent Labs  11/11/14 0438 11/12/14 0557  NA 142 140  K 3.7 3.8  CL 109 111  CO2 27 25  GLUCOSE 147* 99  BUN 24* 26*  CREATININE 1.40* 1.16*  CALCIUM 8.5* 8.6*   CBG (last 3)   Recent Labs  11/12/14 0403 11/12/14 0736 11/12/14 1138  GLUCAP 110* 93 85      Studies/Results: Dg Chest Port 1 View  11/11/2014  CLINICAL DATA:  Increasing shortness of breath.  Productive cough EXAM: PORTABLE CHEST 1 VIEW COMPARISON:  09/01/2013 FINDINGS: Mild hypoventilation with subsegmental atelectasis at the left base. No edema, effusion, or consolidative opacity. Chronic borderline cardiomegaly. Stable aortic and hilar contours. IMPRESSION: Hypoventilation with mild left basilar atelectasis. Electronically Signed   By: Monte Fantasia M.D.   On: 11/11/2014 05:21    Anti-infectives: Anti-infectives    Start     Dose/Rate Route Frequency Ordered Stop   11/09/14 1821   polymyxin B 500,000 Units, bacitracin 50,000 Units in sodium chloride irrigation 0.9 % 500 mL irrigation  Status:  Discontinued       As needed 11/09/14 1821 11/09/14 1857   11/09/14 1140  cefoTEtan (CEFOTAN) 2 g in dextrose 5 % 50 mL IVPB     2 g 100 mL/hr over 30 Minutes Intravenous On call to O.R. 11/09/14 1140 11/09/14 1606      Assessment/Plan: s/p Procedure(s): TAKEDOWN HARTMAN COLOSTOMY Overall doing well without apparent complication.  WBC down.  Advance to [facility] diet PT for mobilization AODM, CRI-stable   LOS: 3 days    Tona Qualley T 11/12/2014

## 2014-11-12 NOTE — Care Management Important Message (Signed)
Important Message  Patient Details  Name: KATALENA FELL MRN: [id number] Date of Birth: June 02, 1933   Medicare Important Message Given:  Yes    Camillo Flaming 11/12/2014, 12:51 [place] Message  Patient Details  Name: CANTRICE STULTS MRN: [id number] Date of Birth: 05-Aug-1933   Medicare Important Message Given:  Yes    Camillo Flaming 11/12/2014, 12:50 PM

## 2014-11-13 LAB — BASIC METABOLIC PANEL
Anion gap: 5 (ref 5–15)
BUN: 22 mg/dL — ABNORMAL HIGH (ref 6–20)
CALCIUM: 8.5 mg/dL — AB (ref 8.9–10.3)
CO2: 27 mmol/L (ref 22–32)
CREATININE: 1.13 mg/dL — AB (ref 0.44–1.00)
Chloride: 107 mmol/L (ref 101–111)
GFR calc non Af Amer: 45 mL/min — ABNORMAL LOW (ref >60)
GFR, EST AFRICAN AMERICAN: 52 mL/min — AB (ref >60)
Glucose, Bld: 106 mg/dL — ABNORMAL HIGH (ref 65–99)
Potassium: 3.9 mmol/L (ref 3.5–5.1)
Sodium: 139 mmol/L (ref 135–145)

## 2014-11-13 LAB — GLUCOSE, CAPILLARY
GLUCOSE-CAPILLARY: 108 mg/dL — AB (ref 65–99)
GLUCOSE-CAPILLARY: 119 mg/dL — AB (ref 65–99)
GLUCOSE-CAPILLARY: 139 mg/dL — AB (ref 65–99)
GLUCOSE-CAPILLARY: 92 mg/dL (ref 65–99)
Glucose-Capillary: 102 mg/dL — ABNORMAL HIGH (ref 65–99)
Glucose-Capillary: 129 mg/dL — ABNORMAL HIGH (ref 65–99)

## 2014-11-13 LAB — CBC
HEMATOCRIT: 29.2 % — AB (ref 36.0–46.0)
HEMOGLOBIN: 9.3 g/dL — AB (ref 12.0–15.0)
MCH: 31.8 pg (ref 26.0–34.0)
MCHC: 31.8 g/dL (ref 30.0–36.0)
MCV: 100 fL (ref 78.0–100.0)
Platelets: 258 10*3/uL (ref 150–400)
RBC: 2.92 MIL/uL — ABNORMAL LOW (ref 3.87–5.11)
RDW: 15.9 % — AB (ref 11.5–15.5)
WBC: 8.7 10*3/uL (ref 4.0–10.5)

## 2014-11-13 NOTE — Progress Notes (Signed)
4 Days Post-Op  Subjective: Sitting on commode having a loose BM.  Tolerated full liquids.  Objective: Vital signs in last 24 hours: Temp:  [98.4 F (36.9 C)-99.2 F (37.3 C)] 98.9 F (37.2 C) (11/12 0437) Pulse Rate:  [67-88] 78 (11/12 0637) Resp:  [18-20] 18 (11/12 0437) BP: (145-179)/(64-73) 159/65 mmHg (11/12 0637) SpO2:  [94 %-100 %] 100 % (11/12 0437) Last BM Date: 11/12/14 (Loose per off going nurse)  Intake/Output from previous day: 11/11 0701 - 11/12 0700 In: 1800 [I.V.:1800] Out: -  Intake/Output this shift:    PE: General- In NAD Abdomen-bandages on  Lab Results:   Recent Labs  11/12/14 0557 11/13/14 0538  WBC 10.5 8.7  HGB 9.7* 9.3*  HCT 30.2* 29.2*  PLT 212 258   BMET  Recent Labs  11/12/14 0557 11/13/14 0538  NA 140 139  K 3.8 3.9  CL 111 107  CO2 25 27  GLUCOSE 99 106*  BUN 26* 22*  CREATININE 1.16* 1.13*  CALCIUM 8.6* 8.5*   PT/INR No results for input(s): LABPROT, INR in the last 72 hours. Comprehensive Metabolic Panel:    Component Value Date/Time   NA 139 11/13/2014 0538   NA 140 11/12/2014 0557   K 3.9 11/13/2014 0538   K 3.8 11/12/2014 0557   CL 107 11/13/2014 0538   CL 111 11/12/2014 0557   CO2 27 11/13/2014 0538   CO2 25 11/12/2014 0557   BUN 22* 11/13/2014 0538   BUN 26* 11/12/2014 0557   CREATININE 1.13* 11/13/2014 0538   CREATININE 1.16* 11/12/2014 0557   GLUCOSE 106* 11/13/2014 0538   GLUCOSE 99 11/12/2014 0557   CALCIUM 8.5* 11/13/2014 0538   CALCIUM 8.6* 11/12/2014 0557   AST 14 06/24/2014 1200   AST 10 12/22/2013 1145   ALT 10 06/24/2014 1200   ALT 9 12/22/2013 1145   ALKPHOS 73 06/24/2014 1200   ALKPHOS 76 12/22/2013 1145   BILITOT 0.5 06/24/2014 1200   BILITOT 0.7 12/22/2013 1145   PROT 7.2 06/24/2014 1200   PROT 6.9 12/22/2013 1145   ALBUMIN 3.7 06/24/2014 1200   ALBUMIN 3.4* 12/22/2013 1145     Studies/Results: No results found.  Anti-infectives: Anti-infectives    Start     Dose/Rate Route  Frequency Ordered Stop   11/09/14 1821  polymyxin B 500,000 Units, bacitracin 50,000 Units in sodium chloride irrigation 0.9 % 500 mL irrigation  Status:  Discontinued       As needed 11/09/14 1821 11/09/14 1857   11/09/14 1140  cefoTEtan (CEFOTAN) 2 g in dextrose 5 % 50 mL IVPB     2 g 100 mL/hr over 30 Minutes Intravenous On call to O.R. 11/09/14 1140 11/09/14 1606      Assessment Active Problems:   Takedown of colostomy 11/09/14-bowels moving    HTN-adequate control on meds.   Type II DM-cbgs 92-134    LOS: 4 days   Plan: Advance to solid CHO modified diet.    Serenidy Waltz J 11/13/2014

## 2014-11-13 NOTE — Evaluation (Signed)
Physical Therapy Evaluation Patient Details Name: Kimberly Castillo MRN: [id number] DOB: 01/12/1933 Today's Date: 11/13/2014   History of Present Illness  Pt in for reversal of colostmy performed on 11/8 /2016. 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   Clinical Impression  Pt presents with some soreness, decreased activity tolerance and strength s/p reversal of colostomy. Feel pt will benefit from continued PT to get pt to safe , I level again at home with family.    Follow Up Recommendations Home health PT    Equipment Recommendations  Rolling walker with 5" wheels    Recommendations for Other Services       Precautions / Restrictions Precautions Precautions: Fall Restrictions Weight Bearing Restrictions: No      Mobility  Bed Mobility Overal bed mobility: Needs Assistance Bed Mobility: Supine to Sit;Sit to Supine     Supine to sit: Min assist Sit to supine: Min assist   General bed mobility comments: HOB elevated and used rails. Cues for sidelying to sit to minimize pain in abdomen for incision. Continue this education to help pt perform bed mobility with least amount of pain  Transfers Overall transfer level: Needs assistance Equipment used: Rolling walker (2 wheeled) Transfers: Sit to/from Stand Sit to Stand: Min guard         General transfer comment: cues for RW safety   Ambulation/Gait Ambulation/Gait assistance: Min guard Ambulation Distance (Feet): 10 Feet (2 x  (to from bathroom)) Assistive device: Rolling walker (2 wheeled) Gait Pattern/deviations: Step-through pattern Gait velocity: slow , slight flexed trunk, however able to straighten up and encouraged for gentle straightening after surgery    General Gait Details: slight SOB with little mobility. Ecouraged Training and development officer Rankin (Stroke Patients Only)       Balance                                              Pertinent Vitals/Pain Pain Assessment: 0-10 Pain Score: 3  (increases when performs bed mobility) Pain Location: abdomen Pain Descriptors / Indicators: Aching Pain Intervention(s): Monitored during session;Repositioned    Home Living Family/patient expects to be discharged to:: Private residence Living Arrangements: Children Available Help at Discharge: Family;Available 24 hours/day Type of Home: House Home Access: Stairs to enter Entrance Stairs-Rails: None (holds to wall, had no difficulty prior to hospitalization ) Entrance Stairs-Number of Steps: 3 Home Layout: One level Home Equipment: Cane - single point (no longer has RW. )      Prior Function Level of Independence: Independent with assistive device(s)         Comments: occassionally used cane when outside but not in the house     Hand Dominance        Extremity/Trunk Assessment               Lower Extremity Assessment: Overall WFL for tasks assessed         Communication   Communication: No difficulties  Cognition Arousal/Alertness: Awake/alert Behavior During Therapy: WFL for tasks assessed/performed Overall Cognitive Status: Within Functional Limits for tasks assessed                      General Comments      Exercises        Assessment/Plan  PT Assessment Patient needs continued PT services  PT Diagnosis Difficulty walking;Generalized weakness   PT Problem List Decreased strength;Decreased activity tolerance;Decreased mobility;Decreased knowledge of use of DME  PT Treatment Interventions DME instruction;Gait training;Functional mobility training;Therapeutic activities;Therapeutic exercise   PT Goals (Current goals can be found in the Care Plan section) Acute Rehab PT Goals Patient Stated Goal: to be able to go home with my family PT Goal Formulation: With patient Time For Goal Achievement: 11/27/14 Potential to Achieve Goals: Good    Frequency Min 3X/week    Barriers to discharge        Co-evaluation               End of Session Equipment Utilized During Treatment: Gait belt (up high due to incision ) Activity Tolerance: Patient tolerated treatment well Patient left: in chair;with call bell/phone within reach;with chair alarm set Nurse Communication: Mobility status         Time: [id number] PT Time Calculation (min) (ACUTE ONLY): 27 min   Charges:   PT Evaluation $Initial PT Evaluation Tier I: 1 Procedure PT Treatments $Therapeutic Activity: 8-22 mins   PT G CodesClide Dales 11/19/14, 10:03 AM  Clide Dales, PT Pager: [phone] 11-19-2014

## 2014-11-14 LAB — GLUCOSE, CAPILLARY
Glucose-Capillary: 102 mg/dL — ABNORMAL HIGH (ref 65–99)
Glucose-Capillary: 103 mg/dL — ABNORMAL HIGH (ref 65–99)
Glucose-Capillary: 105 mg/dL — ABNORMAL HIGH (ref 65–99)
Glucose-Capillary: 114 mg/dL — ABNORMAL HIGH (ref 65–99)
Glucose-Capillary: 92 mg/dL (ref 65–99)

## 2014-11-14 MED ORDER — METFORMIN HCL 500 MG PO TABS
500.0000 mg | ORAL_TABLET | Freq: Two times a day (BID) | ORAL | Status: DC
  Administered 2014-11-14 – 2014-11-16 (×4): 500 mg via ORAL
  Filled 2014-11-14 (×5): qty 1

## 2014-11-14 MED ORDER — INSULIN ASPART 100 UNIT/ML ~~LOC~~ SOLN: 0.0000 [IU] | Freq: Three times a day (TID) | SUBCUTANEOUS | Status: DC

## 2014-11-14 MED ORDER — SODIUM CHLORIDE 0.9 % IJ SOLN: 3.0000 mL | INTRAMUSCULAR | Status: DC | PRN

## 2014-11-14 NOTE — Progress Notes (Signed)
5 Days Post-Op  Subjective: Tolerating diet.  Having BMs.  Needs assistance getting OOB. Short of breath at times (has this at home also)  Objective: Vital signs in last 24 hours: Temp:  [98.6 F (37 C)-100 F (37.8 C)] 98.7 F (37.1 C) (11/13 0547) Pulse Rate:  [79-95] 79 (11/13 0547) Resp:  [19-20] 19 (11/13 0547) BP: (153-182)/(70-80) 158/70 mmHg (11/13 0547) SpO2:  [95 %-100 %] 98 % (11/13 0547) Last BM Date: 11/13/14  Intake/Output from previous day: 11/12 0701 - 11/13 0700 In: 1263.8 [I.V.:1263.8] Out: -  Intake/Output this shift:    PE: General- In NAD Lungs-clear Abd-soft, wounds are clean and intact  Lab Results:   Recent Labs  11/12/14 0557 11/13/14 0538  WBC 10.5 8.7  HGB 9.7* 9.3*  HCT 30.2* 29.2*  PLT 212 258   BMET  Recent Labs  11/12/14 0557 11/13/14 0538  NA 140 139  K 3.8 3.9  CL 111 107  CO2 25 27  GLUCOSE 99 106*  BUN 26* 22*  CREATININE 1.16* 1.13*  CALCIUM 8.6* 8.5*   PT/INR No results for input(s): LABPROT, INR in the last 72 hours. Comprehensive Metabolic Panel:    Component Value Date/Time   NA 139 11/13/2014 0538   NA 140 11/12/2014 0557   K 3.9 11/13/2014 0538   K 3.8 11/12/2014 0557   CL 107 11/13/2014 0538   CL 111 11/12/2014 0557   CO2 27 11/13/2014 0538   CO2 25 11/12/2014 0557   BUN 22* 11/13/2014 0538   BUN 26* 11/12/2014 0557   CREATININE 1.13* 11/13/2014 0538   CREATININE 1.16* 11/12/2014 0557   GLUCOSE 106* 11/13/2014 0538   GLUCOSE 99 11/12/2014 0557   CALCIUM 8.5* 11/13/2014 0538   CALCIUM 8.6* 11/12/2014 0557   AST 14 06/24/2014 1200   AST 10 12/22/2013 1145   ALT 10 06/24/2014 1200   ALT 9 12/22/2013 1145   ALKPHOS 73 06/24/2014 1200   ALKPHOS 76 12/22/2013 1145   BILITOT 0.5 06/24/2014 1200   BILITOT 0.7 12/22/2013 1145   PROT 7.2 06/24/2014 1200   PROT 6.9 12/22/2013 1145   ALBUMIN 3.7 06/24/2014 1200   ALBUMIN 3.4* 12/22/2013 1145     Studies/Results: No results  found.  Anti-infectives: Anti-infectives    Start     Dose/Rate Route Frequency Ordered Stop   11/09/14 1821  polymyxin B 500,000 Units, bacitracin 50,000 Units in sodium chloride irrigation 0.9 % 500 mL irrigation  Status:  Discontinued       As needed 11/09/14 1821 11/09/14 1857   11/09/14 1140  cefoTEtan (CEFOTAN) 2 g in dextrose 5 % 50 mL IVPB     2 g 100 mL/hr over 30 Minutes Intravenous On call to O.R. 11/09/14 1140 11/09/14 1606      Assessment Active Problems:   Takedown of colostomy 11/09/14-bowel function has returned; still on supplemental oxygen    HTN-adequate control on meds.   Type II DM-well controlled on SSI    LOS: 5 days   Plan: Heplock IV.  Restart Metformin.  Wean oxygen off.   Kimberly Castillo 11/14/2014

## 2014-11-15 LAB — GLUCOSE, CAPILLARY
Glucose-Capillary: 105 mg/dL — ABNORMAL HIGH (ref 65–99)
Glucose-Capillary: 91 mg/dL (ref 65–99)
Glucose-Capillary: 103 mg/dL — ABNORMAL HIGH (ref 65–99)
Glucose-Capillary: 101 mg/dL — ABNORMAL HIGH (ref 65–99)

## 2014-11-15 MED ORDER — ROSUVASTATIN CALCIUM 10 MG PO TABS
10.0000 mg | ORAL_TABLET | Freq: Every day | ORAL | Status: DC
  Administered 2014-11-15: 10 mg via ORAL
  Filled 2014-11-15: qty 1

## 2014-11-15 MED ORDER — SIMVASTATIN 40 MG PO TABS: 40.0000 mg | ORAL_TABLET | Freq: Every day | ORAL | Status: DC

## 2014-11-15 NOTE — Care Management Note (Signed)
Case Management Note  Patient Details  Name: KELY MAGER MRN: [id number] Date of Birth: 10-Apr-1933  Subjective/Objective: POD#6 Takedown colostomy. PT-recc  HH, rw. Await final HHPT, home rw order, & face to face. Will offer [facility] agency for choice.                Action/Plan:d/c home w/HHPT/home rw.   Expected Discharge Date:                  Expected Discharge Plan:  [place]  In-House Referral:  NA  Discharge planning Services  CM Consult  Post Acute Care Choice:    Choice offered to:  Patient  DME Arranged:    DME Agency:     HH Arranged:    [place] Agency:     Status of Service:  In process, will continue to follow  Medicare Important Message Given:  Yes Date Medicare IM Given:    Medicare IM give by:    Date Additional Medicare IM Given:    Additional Medicare Important Message give by:     If discussed at [place] of Stay Meetings, dates discussed:    Additional Comments:  Dessa Phi, RN 11/15/2014, 7:58 PM

## 2014-11-15 NOTE — Progress Notes (Signed)
Physical Therapy Treatment Patient Details Name: EBONY GITTENS MRN: [id number] DOB: March 30, 1933 Today's Date: 11/15/2014    History of Present Illness Pt in for reversal of colostmy performed on 11/8 /2016. 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     PT Comments    Progressing with mobility. Min encouragement to progress activity.   Follow Up Recommendations  Home health PT     Equipment Recommendations  Rolling walker with 5" wheels    Recommendations for Other Services       Precautions / Restrictions Precautions Precautions: Fall Restrictions Weight Bearing Restrictions: No    Mobility  Bed Mobility Overal bed mobility: Needs Assistance Bed Mobility: Sit to Sidelying         Sit to sidelying: Min guard General bed mobility comments: close guard for safety. VCs for logroll.   Transfers Overall transfer level: Needs assistance Equipment used: Rolling walker (2 wheeled) Transfers: Sit to/from Stand Sit to Stand: Min guard         General transfer comment: close guard for safety. VCs safety, hand placement  Ambulation/Gait Ambulation/Gait assistance: Min guard Ambulation Distance (Feet): 90 Feet Assistive device: Rolling walker (2 wheeled) Gait Pattern/deviations: Step-through pattern;Decreased stride length     General Gait Details: slow gait speed. close guard for safety.    Stairs            Wheelchair Mobility    Modified Rankin (Stroke Patients Only)       Balance                                    Cognition Arousal/Alertness: Awake/alert Behavior During Therapy: WFL for tasks assessed/performed Overall Cognitive Status: Within Functional Limits for tasks assessed                      Exercises      General Comments        Pertinent Vitals/Pain Pain Assessment: 0-10 Pain Score: 9  Pain Location: abdomen Pain Descriptors / Indicators: Sore Pain Intervention(s): Monitored during session;Repositioned     Home Living                      Prior Function            PT Goals (current goals can now be found in the care plan section) Progress towards PT goals: Progressing toward goals    Frequency  Min 3X/week    PT Plan Current plan remains appropriate    Co-evaluation             End of Session   Activity Tolerance: Patient tolerated treatment well Patient left: in bed;with call bell/phone within reach;with bed alarm set     Time: 1421-1437 PT Time Calculation (min) (ACUTE ONLY): 16 min  Charges:  $Gait Training: 8-22 mins                    G Codes:      Weston Anna, MPT Pager: [phone]

## 2014-11-15 NOTE — Progress Notes (Signed)
Patient ID: Kimberly Castillo, female   DOB: [birth year], 81 y.o.   MRN: [id number] 6 Days Post-Op  Subjective: Incisional pain still significant went up and moving about comfortable when in bed. Bowels have been moving, soft. Tolerating regular diet without nausea.  Some shortness of breath when ambulating. This is improving.  Objective: Vital signs in last 24 hours: Temp:  [98.2 F (36.8 C)-99.6 F (37.6 C)] 98.2 F (36.8 C) (11/14 0929) Pulse Rate:  [78-112] 112 (11/14 0929) Resp:  [18-20] 20 (11/14 0419) BP: (147-179)/(61-98) 151/63 mmHg (11/14 1027) SpO2:  [96 %-98 %] 96 % (11/14 0929) Last BM Date: 11/14/14  Intake/Output from previous day: 11/13 0701 - 11/14 0700 In: 895 [P.O.:720; I.V.:175] Out: -  Intake/Output this shift: Total I/O In: 240 [P.O.:240] Out: -   General appearance: alert, cooperative and no distress Resp: clear to auscultation bilaterally and no increased work of breathing GI: mild incisional tenderness unchanged. No distention. Incision/Wound: clean and dry without erythema or drainage  Lab Results:   Recent Labs  11/13/14 0538  WBC 8.7  HGB 9.3*  HCT 29.2*  PLT 258   BMET  Recent Labs  11/13/14 0538  NA 139  K 3.9  CL 107  CO2 27  GLUCOSE 106*  BUN 22*  CREATININE 1.13*  CALCIUM 8.5*   CBG (last 3)   Recent Labs  11/14/14 1700 11/15/14 0723 11/15/14 1137  GLUCAP 92 91 103*      Studies/Results: No results found.  Anti-infectives: Anti-infectives    Start     Dose/Rate Route Frequency Ordered Stop   11/09/14 1821  polymyxin B 500,000 Units, bacitracin 50,000 Units in sodium chloride irrigation 0.9 % 500 mL irrigation  Status:  Discontinued       As needed 11/09/14 1821 11/09/14 1857   11/09/14 1140  cefoTEtan (CEFOTAN) 2 g in dextrose 5 % 50 mL IVPB     2 g 100 mL/hr over 30 Minutes Intravenous On call to O.R. 11/09/14 1140 11/09/14 1606      Assessment/Plan: s/p Procedure(s): TAKEDOWN HARTMAN COLOSTOMY Doing well  postoperatively without apparent complication. Continue physical therapy. May benefit from short-term SNF.   LOS: 6 days    Eline Geng T 11/15/2014

## 2014-11-16 LAB — GLUCOSE, CAPILLARY
Glucose-Capillary: 89 mg/dL (ref 65–99)
Glucose-Capillary: 90 mg/dL (ref 65–99)

## 2014-11-16 MED ORDER — OXYCODONE-ACETAMINOPHEN 5-325 MG PO TABS: 1.0000 | ORAL_TABLET | Freq: Four times a day (QID) | ORAL | Status: DC | PRN

## 2014-11-16 NOTE — Progress Notes (Signed)
Pt discharge home with RX for pain medications and follow up instructions. Pt given walker prior to discharge. Pt awaiting ride which will be here around 1500 hrs.

## 2014-11-16 NOTE — Discharge Instructions (Signed)
[facility]      [facility] [phone]  OPEN ABDOMINAL SURGERY: POST OP INSTRUCTIONS  Always review your discharge instruction sheet given to you by the facility where your surgery was performed.  IF YOU HAVE DISABILITY OR FAMILY LEAVE FORMS, YOU MUST BRING THEM TO THE OFFICE FOR PROCESSING.  PLEASE DO NOT GIVE THEM TO YOUR DOCTOR.  1. A prescription for pain medication may be given to you upon discharge.  Take your pain medication as prescribed, if needed.  If narcotic pain medicine is not needed, then you may take acetaminophen (Tylenol) or ibuprofen (Advil) as needed. 2. Take your usually prescribed medications unless otherwise directed. 3. If you need a refill on your pain medication, please contact your pharmacy. They will contact our office to request authorization.  Prescriptions will not be filled after 5pm or on week-ends. 4. You should follow a light diet the first few days after arrival home, such as soup and crackers, pudding, etc.unless your doctor has advised otherwise. A high-fiber, low fat diet can be resumed as tolerated.   Be sure to include lots of fluids daily. Most patients will experience some swelling and bruising on the chest and neck area.  Ice packs will help.  Swelling and bruising can take several days to resolve 5. Most patients will experience some swelling and bruising in the area of the incision. Ice pack will help. Swelling and bruising can take several days to resolve..  6. It is common to experience some constipation if taking pain medication after surgery.  Increasing fluid intake and taking a stool softener will usually help or prevent this problem from occurring.  A mild laxative (Milk of Magnesia or Miralax) should be taken according to package directions if there are no bowel movements after 48 hours. 7.  You may have steri-strips (small skin tapes) in place directly over the incision.  These strips should be left on the skin for 7-10 days.  If your  surgeon used skin glue on the incision, you may shower in 24 hours.  The glue will flake off over the next 2-3 weeks.  Any sutures or staples will be removed at the office during your follow-up visit. You may find that a light gauze bandage over your incision may keep your staples from being rubbed or pulled. You may shower and replace the bandage daily. 8. ACTIVITIES:  You may resume regular (light) daily activities beginning the next day--such as daily self-care, walking, climbing stairs--gradually increasing activities as tolerated.  You may have sexual intercourse when it is comfortable.  Refrain from any heavy lifting or straining until approved by your doctor. a. You may drive when you no longer are taking prescription pain medication, you can comfortably wear a seatbelt, and you can safely maneuver your car and apply brakes b. Return to Work: ___________________________________ 9. You should see your doctor in the office for a follow-up appointment approximately two weeks after your surgery.  Make sure that you call for this appointment within a day or two after you arrive home to insure a convenient appointment time. OTHER INSTRUCTIONS:  _____________________________________________________________ _____________________________________________________________  WHEN TO CALL YOUR DOCTOR: 1. Fever over 101.0 2. Inability to urinate 3. Nausea and/or vomiting 4. Extreme swelling or bruising 5. Continued bleeding from incision. 6. Increased pain, redness, or drainage from the incision. 7. Difficulty swallowing or breathing 8. Muscle cramping or spasms. 9. Numbness or tingling in hands or feet or around lips.  The clinic staff is available to   answer your questions during regular business hours.  Please don't hesitate to call and ask to speak to one of the nurses if you have concerns.  For further questions, please visit [email or web address]   

## 2014-11-16 NOTE — Discharge Summary (Signed)
Patient ID: Kimberly [id number] y.o. Mar 30, 1933  11/09/2014  Discharge date and time: 11/16/2014   Admitting Physician: Excell Seltzer T  Discharge Physician: Excell Seltzer T  Admission Diagnoses: colostomy status  Discharge Diagnoses: Same  Operations: Procedure(s): TAKEDOWN [place] COLOSTOMY  Admission Condition: good  Discharged Condition: good  Indication for Admission: Patient is approximately one year status post emergency Hartmann colectomy for perforated diverticulitis with multiple abdominal abscesses. She has made a complete recovery and strongly desires colostomy takedown. After extensive discussion regarding alternatives and indications and risks detailed elsewhere she is electively admitted following a mechanical and antibiotic bowel prep at home for takedown of her [facility] colostomy.  Hospital Course: On the morning of admission the patient underwent an uneventful takedown of her Jeanette Caprice colostomy with resection of the remainder of her left colon due to extensive diverticulosis and also a firm area in the left colon which on final pathology proved to be a small  Diverticular abscess.  Her postoperative course considering her age and comorbidities was unremarkable. She was slow to mobilize due to incisional pain.  However there were no specific complications. Her bowel function gradually returned and she was able to be started on a clear liquid diet and advanced to a regular diet prior to discharge which he is taking well. Both incisions were stapled closed and healing without evidence of infection at the time of discharge. CBC and electrolytes are unremarkable. She has chronic renal insufficiency and her BUN/creatinine remained stable. At the time of discharge she is ambulatory. She will receive home physical therapy.   Disposition: Home  Patient Instructions:    Medication List    TAKE these medications        aspirin EC 81 MG tablet  Take 81 mg  by mouth daily.     bisacodyl 5 MG EC tablet  Commonly known as:  DULCOLAX  Take 5 mg by mouth as directed. TAKES THE DAY BEFORE SURGERY     cephALEXin 500 MG capsule  Commonly known as:  KEFLEX  Take 1 capsule (500 mg total) by mouth 4 (four) times daily.     dexamethasone 4 MG tablet  Commonly known as:  DECADRON  Take 2-4 mg by mouth daily as needed (FOR BACK PAIN).     diazepam 5 MG tablet  Commonly known as:  VALIUM  TAKE ONE-HALF TO ONE TABLET BY MOUTH ONCE DAILY AS NEEDED     diltiazem 120 MG 24 hr capsule  Commonly known as:  DILACOR XR  Take 120 mg by mouth every morning.     HYDROcodone-acetaminophen 10-325 MG tablet  Commonly known as:  NORCO  Take 1 tablet by mouth every 8 (eight) hours as needed for moderate pain.     ibuprofen 200 MG tablet  Commonly known as:  ADVIL,MOTRIN  Take 400 mg by mouth every 6 (six) hours as needed for moderate pain.     latanoprost 0.005 % ophthalmic solution  Commonly known as:  XALATAN  Place 1 drop into both eyes at bedtime.     losartan-hydrochlorothiazide 100-25 MG tablet  Commonly known as:  HYZAAR  Take 1 tablet by mouth daily. DC any old scripts for same medication     metFORMIN 500 MG tablet  Commonly known as:  GLUCOPHAGE  Take 1 tablet (500 mg total) by mouth 2 (two) times daily with a meal.     metroNIDAZOLE 500 MG tablet  Commonly known as:  FLAGYL  Take 500 mg by mouth as  directed. TAKES 2 TABLETS AT 2 PM,3PM AND 10 PM THE DAY BEFORE SURGERY     neomycin 500 MG tablet  Commonly known as:  MYCIFRADIN  Take 500 mg by mouth as directed. TAKES 1 at 2 pm,3pm,and 10 pm the day before surgery     nortriptyline 10 MG capsule  Commonly known as:  PAMELOR  1-3 tabs by mouth at bedtime for Headache prevention     oxyCODONE-acetaminophen 5-325 MG tablet  Commonly known as:  PERCOCET/ROXICET  Take 1-2 tablets by mouth every 6 (six) hours as needed for moderate pain.     pantoprazole 40 MG tablet  Commonly known as:   PROTONIX  Take 1 tablet (40 mg total) by mouth daily.     polyethylene glycol packet  Commonly known as:  MIRALAX / GLYCOLAX  Take 17 g by mouth as directed. Takes the day before surgery     potassium chloride 10 MEQ tablet  Commonly known as:  K-DUR  2 tabs by mouth per day     predniSONE 10 MG tablet  Commonly known as:  DELTASONE  2 tabs by mouth per day for 4 days     simvastatin 40 MG tablet  Commonly known as:  ZOCOR  Take 1 tablet (40 mg total) by mouth at bedtime. DC any old scripts for same medication     sulindac 200 MG tablet  Commonly known as:  CLINORIL  Take 200 mg by mouth 2 (two) times daily.     timolol 0.5 % ophthalmic solution  Commonly known as:  BETIMOL  Place 1 drop into both eyes daily.     valACYclovir 500 MG tablet  Commonly known as:  VALTREX  TAKE ONE TABLET BY MOUTH TWICE DAILY     valACYclovir 500 MG tablet  Commonly known as:  VALTREX  TAKE ONE TABLET BY MOUTH TWICE DAILY     Vitamin D3 5000 UNITS Caps  Take 1 capsule by mouth daily.        Activity: no heavy lifting for 6 weeks Diet: diabetic diet Wound Care: none needed  Follow-up:  With Dr. Excell Seltzer in 3 weeks and nurse visit in one week for staple removal.  Signed: Edward Jolly MD, FACS  11/16/2014, 11:00 AM

## 2014-11-16 NOTE — Care Management Important Message (Signed)
Important Message  Patient Details  Name: Kimberly Castillo MRN: [id number] Date of Birth: 1933-06-08   Medicare Important Message Given:  Yes    Camillo Flaming 11/16/2014, 10:44 AMImportant Message  Patient Details  Name: Kimberly Castillo MRN: [id number] Date of Birth: [birth year]   Medicare Important Message Given:  Yes    Camillo Flaming 11/16/2014, 10:44 AM

## 2014-11-16 NOTE — Progress Notes (Signed)
Physical Therapy Treatment Patient Details Name: Kimberly Castillo MRN: [id number] DOB: 1933/07/09 Today's Date: 11/16/2014    History of Present Illness Pt in for reversal of colostmy performed on 11/8 /2016. 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     PT Comments    Progressing well with mobility.   Follow Up Recommendations  Home health PT     Equipment Recommendations       Recommendations for Other Services       Precautions / Restrictions Precautions Precautions: Fall Restrictions Weight Bearing Restrictions: No    Mobility  Bed Mobility               General bed mobility comments: oob in recliner  Transfers Overall transfer level: Needs assistance Equipment used: Rolling walker (2 wheeled) Transfers: Sit to/from Stand Sit to Stand: Supervision         General transfer comment: VCs safety, proper use of rollator.   Ambulation/Gait Ambulation/Gait assistance: Supervision Ambulation Distance (Feet): 125 Feet Assistive device: 4-wheeled walker Gait Pattern/deviations: Step-through pattern     General Gait Details: supervision for safety.    Stairs            Wheelchair Mobility    Modified Rankin (Stroke Patients Only)       Balance                                    Cognition Arousal/Alertness: Awake/alert Behavior During Therapy: WFL for tasks assessed/performed Overall Cognitive Status: Within Functional Limits for tasks assessed                      Exercises      General Comments        Pertinent Vitals/Pain Pain Assessment: 0-10 Pain Score: 4  Pain Location: abdomen Pain Descriptors / Indicators: Sore Pain Intervention(s): Monitored during session    Home Living                      Prior Function            PT Goals (current goals can now be found in the care plan section) Progress towards PT goals: Progressing toward goals    Frequency  Min 3X/week    PT Plan Current plan  remains appropriate    Co-evaluation             End of Session   Activity Tolerance: Patient tolerated treatment well Patient left: in chair;with call bell/phone within reach     Time: 1205-1213 PT Time Calculation (min) (ACUTE ONLY): 8 min  Charges:  $Gait Training: 8-22 mins                    G Codes:      Weston Anna, MPT Pager: [phone]

## 2014-11-16 NOTE — Care Management Note (Signed)
Case Management Note  Patient Details  Name: Kimberly Castillo MRN: [id number] Date of Birth: Oct 28, 1933  Subjective/Objective: [facility] chosen for HHPT-TC Cyril Mourning rep aware of d/c & hhpt order.HC dme rep Lecretia aware of home rw w/seat, & d/c to be delivered to rm.                   Action/Plan:d/c home w/HHC/DME.   Expected Discharge Date:                  Expected Discharge Plan:  [place]  In-House Referral:  NA  Discharge planning Services  CM Consult  Post Acute Care Choice:    Choice offered to:  Patient  DME Arranged:  Walker rolling with seat DME Agency:  [place]:  PT [place] Agency:  [place]  Status of Service:  Completed, signed off  Medicare Important Message Given:  Yes Date Medicare IM Given:    Medicare IM give by:    Date Additional Medicare IM Given:    Additional Medicare Important Message give by:     If discussed at [place] of Stay Meetings, dates discussed:    Additional Comments:  Dessa Phi, RN 11/16/2014, 11:25 AM

## 2014-11-17 ENCOUNTER — Telehealth: Payer: Self-pay | Admitting: *Deleted

## 2014-11-17 NOTE — Telephone Encounter (Signed)
Pt was on tcm list had Colostomy procedure done. D/C 11/15, and will be f/u with Dr. Excell Seltzer in 2 weeks...Johny Chess

## 2014-11-18 DIAGNOSIS — F419 Anxiety disorder, unspecified: Secondary | ICD-10-CM | POA: Diagnosis not present

## 2014-11-18 DIAGNOSIS — M1990 Unspecified osteoarthritis, unspecified site: Secondary | ICD-10-CM | POA: Diagnosis not present

## 2014-11-18 DIAGNOSIS — E119 Type 2 diabetes mellitus without complications: Secondary | ICD-10-CM | POA: Diagnosis not present

## 2014-11-18 DIAGNOSIS — Z7984 Long term (current) use of oral hypoglycemic drugs: Secondary | ICD-10-CM | POA: Diagnosis not present

## 2014-11-18 DIAGNOSIS — I10 Essential (primary) hypertension: Secondary | ICD-10-CM | POA: Diagnosis not present

## 2014-11-18 DIAGNOSIS — Z48815 Encounter for surgical aftercare following surgery on the digestive system: Secondary | ICD-10-CM | POA: Diagnosis not present

## 2014-11-18 DIAGNOSIS — I4891 Unspecified atrial fibrillation: Secondary | ICD-10-CM | POA: Diagnosis not present

## 2014-11-19 DIAGNOSIS — R269 Unspecified abnormalities of gait and mobility: Secondary | ICD-10-CM | POA: Diagnosis not present

## 2014-11-23 DIAGNOSIS — M1990 Unspecified osteoarthritis, unspecified site: Secondary | ICD-10-CM | POA: Diagnosis not present

## 2014-11-23 DIAGNOSIS — E119 Type 2 diabetes mellitus without complications: Secondary | ICD-10-CM | POA: Diagnosis not present

## 2014-11-23 DIAGNOSIS — Z48815 Encounter for surgical aftercare following surgery on the digestive system: Secondary | ICD-10-CM | POA: Diagnosis not present

## 2014-11-23 DIAGNOSIS — Z7984 Long term (current) use of oral hypoglycemic drugs: Secondary | ICD-10-CM | POA: Diagnosis not present

## 2014-11-23 DIAGNOSIS — F419 Anxiety disorder, unspecified: Secondary | ICD-10-CM | POA: Diagnosis not present

## 2014-11-23 DIAGNOSIS — I10 Essential (primary) hypertension: Secondary | ICD-10-CM | POA: Diagnosis not present

## 2014-11-23 DIAGNOSIS — I4891 Unspecified atrial fibrillation: Secondary | ICD-10-CM | POA: Diagnosis not present

## 2014-11-26 DIAGNOSIS — Z48815 Encounter for surgical aftercare following surgery on the digestive system: Secondary | ICD-10-CM | POA: Diagnosis not present

## 2014-11-26 DIAGNOSIS — Z7984 Long term (current) use of oral hypoglycemic drugs: Secondary | ICD-10-CM | POA: Diagnosis not present

## 2014-11-26 DIAGNOSIS — I4891 Unspecified atrial fibrillation: Secondary | ICD-10-CM | POA: Diagnosis not present

## 2014-11-26 DIAGNOSIS — F419 Anxiety disorder, unspecified: Secondary | ICD-10-CM | POA: Diagnosis not present

## 2014-11-26 DIAGNOSIS — I10 Essential (primary) hypertension: Secondary | ICD-10-CM | POA: Diagnosis not present

## 2014-11-26 DIAGNOSIS — E119 Type 2 diabetes mellitus without complications: Secondary | ICD-10-CM | POA: Diagnosis not present

## 2014-11-26 DIAGNOSIS — M1990 Unspecified osteoarthritis, unspecified site: Secondary | ICD-10-CM | POA: Diagnosis not present

## 2014-11-30 DIAGNOSIS — Z7984 Long term (current) use of oral hypoglycemic drugs: Secondary | ICD-10-CM | POA: Diagnosis not present

## 2014-11-30 DIAGNOSIS — M1990 Unspecified osteoarthritis, unspecified site: Secondary | ICD-10-CM | POA: Diagnosis not present

## 2014-11-30 DIAGNOSIS — E119 Type 2 diabetes mellitus without complications: Secondary | ICD-10-CM | POA: Diagnosis not present

## 2014-11-30 DIAGNOSIS — Z48815 Encounter for surgical aftercare following surgery on the digestive system: Secondary | ICD-10-CM | POA: Diagnosis not present

## 2014-11-30 DIAGNOSIS — I4891 Unspecified atrial fibrillation: Secondary | ICD-10-CM | POA: Diagnosis not present

## 2014-11-30 DIAGNOSIS — F419 Anxiety disorder, unspecified: Secondary | ICD-10-CM | POA: Diagnosis not present

## 2014-11-30 DIAGNOSIS — I10 Essential (primary) hypertension: Secondary | ICD-10-CM | POA: Diagnosis not present

## 2014-12-02 DIAGNOSIS — M1990 Unspecified osteoarthritis, unspecified site: Secondary | ICD-10-CM | POA: Diagnosis not present

## 2014-12-02 DIAGNOSIS — F419 Anxiety disorder, unspecified: Secondary | ICD-10-CM | POA: Diagnosis not present

## 2014-12-02 DIAGNOSIS — E119 Type 2 diabetes mellitus without complications: Secondary | ICD-10-CM | POA: Diagnosis not present

## 2014-12-02 DIAGNOSIS — I4891 Unspecified atrial fibrillation: Secondary | ICD-10-CM | POA: Diagnosis not present

## 2014-12-02 DIAGNOSIS — I10 Essential (primary) hypertension: Secondary | ICD-10-CM | POA: Diagnosis not present

## 2014-12-02 DIAGNOSIS — Z7984 Long term (current) use of oral hypoglycemic drugs: Secondary | ICD-10-CM | POA: Diagnosis not present

## 2014-12-02 DIAGNOSIS — Z48815 Encounter for surgical aftercare following surgery on the digestive system: Secondary | ICD-10-CM | POA: Diagnosis not present

## 2014-12-07 DIAGNOSIS — I10 Essential (primary) hypertension: Secondary | ICD-10-CM | POA: Diagnosis not present

## 2014-12-07 DIAGNOSIS — M1990 Unspecified osteoarthritis, unspecified site: Secondary | ICD-10-CM | POA: Diagnosis not present

## 2014-12-07 DIAGNOSIS — I4891 Unspecified atrial fibrillation: Secondary | ICD-10-CM | POA: Diagnosis not present

## 2014-12-07 DIAGNOSIS — Z48815 Encounter for surgical aftercare following surgery on the digestive system: Secondary | ICD-10-CM | POA: Diagnosis not present

## 2014-12-07 DIAGNOSIS — E119 Type 2 diabetes mellitus without complications: Secondary | ICD-10-CM | POA: Diagnosis not present

## 2014-12-07 DIAGNOSIS — Z7984 Long term (current) use of oral hypoglycemic drugs: Secondary | ICD-10-CM | POA: Diagnosis not present

## 2014-12-07 DIAGNOSIS — F419 Anxiety disorder, unspecified: Secondary | ICD-10-CM | POA: Diagnosis not present

## 2014-12-10 DIAGNOSIS — F419 Anxiety disorder, unspecified: Secondary | ICD-10-CM | POA: Diagnosis not present

## 2014-12-10 DIAGNOSIS — Z48815 Encounter for surgical aftercare following surgery on the digestive system: Secondary | ICD-10-CM | POA: Diagnosis not present

## 2014-12-10 DIAGNOSIS — I4891 Unspecified atrial fibrillation: Secondary | ICD-10-CM | POA: Diagnosis not present

## 2014-12-10 DIAGNOSIS — I10 Essential (primary) hypertension: Secondary | ICD-10-CM | POA: Diagnosis not present

## 2014-12-10 DIAGNOSIS — M1990 Unspecified osteoarthritis, unspecified site: Secondary | ICD-10-CM | POA: Diagnosis not present

## 2014-12-10 DIAGNOSIS — Z7984 Long term (current) use of oral hypoglycemic drugs: Secondary | ICD-10-CM | POA: Diagnosis not present

## 2014-12-10 DIAGNOSIS — E119 Type 2 diabetes mellitus without complications: Secondary | ICD-10-CM | POA: Diagnosis not present

## 2014-12-24 ENCOUNTER — Ambulatory Visit: Payer: Medicare Other | Admitting: Internal Medicine

## 2015-01-20 ENCOUNTER — Telehealth: Payer: Self-pay | Admitting: Internal Medicine

## 2015-01-20 NOTE — Telephone Encounter (Signed)
Pt wanted to clarify appt date and time

## 2015-01-20 NOTE — Telephone Encounter (Signed)
Pt request to speak to the assistant concern about medication (will not say which med she need). Please give her a call back

## 2015-01-26 ENCOUNTER — Ambulatory Visit (INDEPENDENT_AMBULATORY_CARE_PROVIDER_SITE_OTHER): Payer: Medicare Other | Admitting: Internal Medicine

## 2015-01-26 ENCOUNTER — Other Ambulatory Visit (INDEPENDENT_AMBULATORY_CARE_PROVIDER_SITE_OTHER): Payer: Medicare Other

## 2015-01-26 ENCOUNTER — Encounter: Payer: Self-pay | Admitting: Internal Medicine

## 2015-01-26 VITALS — BP 138/90 | HR 107 | Temp 97.6°F | Resp 20 | Ht 62.0 in | Wt 161.0 lb

## 2015-01-26 DIAGNOSIS — E119 Type 2 diabetes mellitus without complications: Secondary | ICD-10-CM

## 2015-01-26 DIAGNOSIS — M79672 Pain in left foot: Secondary | ICD-10-CM

## 2015-01-26 DIAGNOSIS — Z0000 Encounter for general adult medical examination without abnormal findings: Secondary | ICD-10-CM

## 2015-01-26 DIAGNOSIS — M79671 Pain in right foot: Secondary | ICD-10-CM | POA: Insufficient documentation

## 2015-01-26 DIAGNOSIS — I10 Essential (primary) hypertension: Secondary | ICD-10-CM

## 2015-01-26 DIAGNOSIS — I4891 Unspecified atrial fibrillation: Secondary | ICD-10-CM

## 2015-01-26 DIAGNOSIS — E785 Hyperlipidemia, unspecified: Secondary | ICD-10-CM | POA: Diagnosis not present

## 2015-01-26 LAB — URINALYSIS, ROUTINE W REFLEX MICROSCOPIC
Bilirubin Urine: NEGATIVE
Hgb urine dipstick: NEGATIVE
KETONES UR: NEGATIVE
Leukocytes, UA: NEGATIVE
NITRITE: NEGATIVE
PH: 6 (ref 5.0–8.0)
RBC / HPF: NONE SEEN (ref 0–?)
SPECIFIC GRAVITY, URINE: 1.025 (ref 1.000–1.030)
Total Protein, Urine: 100 — AB
Urine Glucose: NEGATIVE
Urobilinogen, UA: 0.2 (ref 0.0–1.0)

## 2015-01-26 LAB — CBC WITH DIFFERENTIAL/PLATELET
BASOS PCT: 1.1 % (ref 0.0–3.0)
Basophils Absolute: 0.1 10*3/uL (ref 0.0–0.1)
EOS ABS: 0.1 10*3/uL (ref 0.0–0.7)
Eosinophils Relative: 1.6 % (ref 0.0–5.0)
HCT: 35.1 % — ABNORMAL LOW (ref 36.0–46.0)
HEMOGLOBIN: 11.4 g/dL — AB (ref 12.0–15.0)
Lymphocytes Relative: 41.7 % (ref 12.0–46.0)
Lymphs Abs: 2.3 10*3/uL (ref 0.7–4.0)
MCHC: 32.6 g/dL (ref 30.0–36.0)
MCV: 94.1 fl (ref 78.0–100.0)
MONO ABS: 0.7 10*3/uL (ref 0.1–1.0)
Monocytes Relative: 13.1 % — ABNORMAL HIGH (ref 3.0–12.0)
Neutro Abs: 2.3 10*3/uL (ref 1.4–7.7)
Neutrophils Relative %: 42.5 % — ABNORMAL LOW (ref 43.0–77.0)
Platelets: 222 10*3/uL (ref 150.0–400.0)
RBC: 3.73 Mil/uL — AB (ref 3.87–5.11)
RDW: 17.4 % — AB (ref 11.5–15.5)
WBC: 5.4 10*3/uL (ref 4.0–10.5)

## 2015-01-26 LAB — HEPATIC FUNCTION PANEL
ALBUMIN: 3.8 g/dL (ref 3.5–5.2)
ALT: 10 U/L (ref 0–35)
AST: 15 U/L (ref 0–37)
Alkaline Phosphatase: 64 U/L (ref 39–117)
Bilirubin, Direct: 0 mg/dL (ref 0.0–0.3)
TOTAL PROTEIN: 7.2 g/dL (ref 6.0–8.3)
Total Bilirubin: 0.3 mg/dL (ref 0.2–1.2)

## 2015-01-26 LAB — BASIC METABOLIC PANEL
BUN: 28 mg/dL — AB (ref 6–23)
CHLORIDE: 106 meq/L (ref 96–112)
CO2: 26 meq/L (ref 19–32)
Calcium: 9.5 mg/dL (ref 8.4–10.5)
Creatinine, Ser: 1.35 mg/dL — ABNORMAL HIGH (ref 0.40–1.20)
GFR: 48.52 mL/min — ABNORMAL LOW (ref >60.00)
GLUCOSE: 108 mg/dL — AB (ref 70–99)
POTASSIUM: 3.6 meq/L (ref 3.5–5.1)
Sodium: 141 mEq/L (ref 135–145)

## 2015-01-26 LAB — LIPID PANEL
CHOLESTEROL: 198 mg/dL (ref 0–200)
HDL: 49.4 mg/dL (ref >39.00)
LDL Cholesterol: 113 mg/dL — ABNORMAL HIGH (ref 0–99)
NonHDL: 148.58
TRIGLYCERIDES: 178 mg/dL — AB (ref 0.0–149.0)
Total CHOL/HDL Ratio: 4
VLDL: 35.6 mg/dL (ref 0.0–40.0)

## 2015-01-26 LAB — MICROALBUMIN / CREATININE URINE RATIO
CREATININE, U: 234.3 mg/dL
MICROALB/CREAT RATIO: 26.4 mg/g (ref 0.0–30.0)
Microalb, Ur: 61.8 mg/dL — ABNORMAL HIGH (ref 0.0–1.9)

## 2015-01-26 LAB — TSH: TSH: 0.86 u[IU]/mL (ref 0.35–4.50)

## 2015-01-26 LAB — HEMOGLOBIN A1C: Hgb A1c MFr Bld: 6.3 % (ref 4.6–6.5)

## 2015-01-26 MED ORDER — SULINDAC 200 MG PO TABS: 200.0000 mg | ORAL_TABLET | Freq: Two times a day (BID) | ORAL | Status: DC

## 2015-01-26 NOTE — Assessment & Plan Note (Signed)
stable overall by history and exam, recent data reviewed with pt, and pt to continue medical treatment as before,  to f/u any worsening symptoms or concerns BP Readings from Last 3 Encounters:  01/26/15 138/90  11/16/14 186/93  11/01/14 159/71

## 2015-01-26 NOTE — Progress Notes (Signed)
Subjective:    Patient ID: Kimberly Castillo, female    DOB: 23-Feb-1933, 82 y.o.   MRN: [id number]  HPI   Here for wellness and f/u;  Overall doing ok;  Pt denies Chest pain, worsening SOB, DOE, wheezing, orthopnea, PND, worsening LE edema, palpitations, dizziness or syncope.  Pt denies neurological change such as new headache, facial or extremity weakness.  Pt denies polydipsia, polyuria, or low sugar symptoms. Pt states overall good compliance with treatment and medications, good tolerability, and has been trying to follow appropriate diet.  Pt denies worsening depressive symptoms, suicidal ideation or panic. No fever, night sweats, wt loss, loss of appetite, or other constitutional symptoms.  Pt states good ability with ADL's, has low fall risk, home safety reviewed and adequate, no other significant changes in hearing or vision, and only occasionally active with exercise.  S/p colostomy reversal per Dr Excell Seltzer, seen yesterday in f/u, doing well, no further f/u needed.. Declines flu shot  Pt continues to have recurring LBP without change in severity, bowel or bladder change, fever, wt loss,  worsening LE pain/numbness/weakness, gait change or falls. Asks for percocet and prednisone but normally gets this per ortho. Past Medical History  Diagnosis Date  . Glaucoma   . Diabetes mellitus ([place])   . Hypertension   . Hyperlipidemia   . Arthritis     hands, back  . History of shingles   . Cataracts, bilateral   . A-fib ([place])   . Post herpetic neuralgia 12/19/2012  . Chronic lower back pain 12/19/2012  . Arthritis, lumbar spine 12/19/2012  . Anxiety state 12/22/2013  . Heart murmur   . Headache     hx migraines  . Numbness     rt side of rt leg  . Dysrhythmia   . Diverticulitis    Past Surgical History  Procedure Laterality Date  . Incision and drainage breast abscess  1958    left  . Abdominal hysterectomy  1964    partial  . Colon resection N/A 09/02/2013    Procedure: [place] colectomy;   Surgeon: Excell Seltzer, MD;  Location: [facility];  Service: General;  Laterality: N/A;  . Colostomy takedown N/A 11/09/2014    Procedure: TAKEDOWN [place] COLOSTOMY;  Surgeon: Excell Seltzer, MD;  Location: [facility];  Service: General;  Laterality: N/A;    reports that she quit smoking about 17 years ago. Her smoking use included Cigarettes. She started smoking about 23 years ago. She has never used smokeless tobacco. She reports that she does not drink alcohol or use illicit drugs. family history includes Hypertension in her maternal grandmother and mother. There is no history of Colon cancer or Stomach cancer. No Known Allergies Current Outpatient Prescriptions on File Prior to Visit  Medication Sig Dispense Refill  . aspirin EC 81 MG tablet Take 81 mg by mouth daily.    . bisacodyl (DULCOLAX) 5 MG EC tablet Take 5 mg by mouth as directed. TAKES THE DAY BEFORE SURGERY    . cephALEXin (KEFLEX) 500 MG capsule Take 1 capsule (500 mg total) by mouth 4 (four) times daily. 40 capsule 0  . Cholecalciferol (VITAMIN D3) 5000 UNITS CAPS Take 1 capsule by mouth daily.    [name] dexamethasone (DECADRON) 4 MG tablet Take 2-4 mg by mouth daily as needed (FOR BACK PAIN).    [name] diazepam (VALIUM) 5 MG tablet TAKE ONE-HALF TO ONE TABLET BY MOUTH ONCE DAILY AS NEEDED (Patient taking differently: TAKE ONE-HALF TO ONE TABLET BY MOUTH  ONCE DAILY AS NEEDED FOR ANXIETY) 30 tablet 2  . diltiazem (DILACOR XR) 120 MG 24 hr capsule Take 120 mg by mouth every morning.    [name] HYDROcodone-acetaminophen (NORCO) 10-325 MG tablet Take 1 tablet by mouth every 8 (eight) hours as needed for moderate pain.    [name] ibuprofen (ADVIL,MOTRIN) 200 MG tablet Take 400 mg by mouth every 6 (six) hours as needed for moderate pain.    [name] latanoprost (XALATAN) 0.005 % ophthalmic solution Place 1 drop into both eyes at bedtime.    [name] losartan-hydrochlorothiazide (HYZAAR) 100-25 MG per tablet Take 1 tablet by mouth daily. DC any old scripts for same  medication 90 tablet 4  . metFORMIN (GLUCOPHAGE) 500 MG tablet Take 1 tablet (500 mg total) by mouth 2 (two) times daily with a meal. 180 tablet 3  . metroNIDAZOLE (FLAGYL) 500 MG tablet Take 500 mg by mouth as directed. TAKES 2 TABLETS AT 2 PM,3PM AND 10 PM THE DAY BEFORE SURGERY    . neomycin (MYCIFRADIN) 500 MG tablet Take 500 mg by mouth as directed. TAKES 1 at 2 pm,3pm,and 10 pm the day before surgery    . nortriptyline (PAMELOR) 10 MG capsule 1-3 tabs by mouth at bedtime for Headache prevention 270 capsule 1  . oxyCODONE-acetaminophen (PERCOCET/ROXICET) 5-325 MG tablet Take 1-2 tablets by mouth every 6 (six) hours as needed for moderate pain. 50 tablet 0  . pantoprazole (PROTONIX) 40 MG tablet Take 1 tablet (40 mg total) by mouth daily. 90 tablet 3  . polyethylene glycol (MIRALAX / GLYCOLAX) packet Take 17 g by mouth as directed. Takes the day before surgery    . potassium chloride (K-DUR) 10 MEQ tablet 2 tabs by mouth per day (Patient taking differently: Take 20 mEq by mouth daily. ) 180 tablet 3  . predniSONE (DELTASONE) 10 MG tablet 2 tabs by mouth per day for 4 days 8 tablet 0  . simvastatin (ZOCOR) 40 MG tablet Take 1 tablet (40 mg total) by mouth at bedtime. DC any old scripts for same medication 90 tablet 4  . sulindac (CLINORIL) 200 MG tablet Take 200 mg by mouth 2 (two) times daily.    . timolol (BETIMOL) 0.5 % ophthalmic solution Place 1 drop into both eyes daily.    . valACYclovir (VALTREX) 500 MG tablet TAKE ONE TABLET BY MOUTH TWICE DAILY 180 tablet 0  . valACYclovir (VALTREX) 500 MG tablet TAKE ONE TABLET BY MOUTH TWICE DAILY 180 tablet 0   No current facility-administered medications on file prior to visit.    Review of Systems Constitutional: Negative for increased diaphoresis, other activity, appetite or siginficant weight change other than noted HENT: Negative for worsening hearing loss, ear pain, facial swelling, mouth sores and neck stiffness.   Eyes: Negative for  other worsening pain, redness or visual disturbance.  Respiratory: Negative for shortness of breath and wheezing  Cardiovascular: Negative for chest pain and palpitations.  Gastrointestinal: Negative for diarrhea, blood in stool, abdominal distention or other pain Genitourinary: Negative for hematuria, flank pain or change in urine volume.  Musculoskeletal: Negative for myalgias or other joint complaints.  Skin: Negative for color change and wound or drainage.  Neurological: Negative for syncope and numbness. other than noted Hematological: Negative for adenopathy. or other swelling Psychiatric/Behavioral: Negative for hallucinations, SI, self-injury, decreased concentration or other worsening agitation.      Objective:   Physical Exam BP 138/90 mmHg  Pulse 107  Temp(Src) 97.6 F (36.4 C) (Oral)  Resp 20  Ht 5\' 2"  (1.575 m)  Wt 161 lb (73.029 kg)  BMI 29.44 kg/m2  SpO2 94% /VS noted,  Constitutional: Pt is oriented to person, place, and time. Appears well-developed and well-nourished, in no significant distress Head: Normocephalic and atraumatic.  Right Ear: External ear normal.  Left Ear: External ear normal.  Nose: Nose normal.  Mouth/Throat: Oropharynx is clear and moist.  Eyes: Conjunctivae and EOM are normal. Pupils are equal, round, and reactive to light.  Neck: Normal range of motion. Neck supple. No JVD present. No tracheal deviation present or significant neck LA or mass Cardiovascular: Normal rate, regular rhythm, normal heart sounds and intact distal pulses.   Pulmonary/Chest: Effort normal and breath sounds without rales or wheezing  Abdominal: Soft. Bowel sounds are normal. NT. No HSM  Musculoskeletal: Normal range of motion. Exhibits no edema.  Lymphadenopathy:  Has no cervical adenopathy.  Neurological: Pt is alert and oriented to person, place, and time. Pt has normal reflexes. No cranial nerve deficit. Motor grossly intact Skin: Skin is warm and dry. No rash  noted.  Psychiatric:  Has normal mood and affect. Behavior is normal.     Assessment & Plan:

## 2015-01-26 NOTE — Assessment & Plan Note (Signed)
stable overall by history and exam, recent data reviewed with pt, and pt to continue medical treatment as before,  to f/u any worsening symptoms or concerns Lab Results  Component Value Date   HGBA1C 6.9* 11/01/2014

## 2015-01-26 NOTE — Assessment & Plan Note (Signed)
For podiatry referal

## 2015-01-26 NOTE — Progress Notes (Signed)
Pre visit review using our clinic review tool, if applicable. No additional management support is needed unless otherwise documented below in the visit note. 

## 2015-01-26 NOTE — Assessment & Plan Note (Addendum)

## 2015-01-26 NOTE — Patient Instructions (Signed)
Your EKG was OK today  Please continue all other medications as before, and refills have been done if requested.  Please have the pharmacy call with any other refills you may need.  Please continue your efforts at being more active, low cholesterol diet, and weight control.  You are otherwise up to date with prevention measures today.  Please keep your appointments with your specialists as you may have planned  You will be contacted regarding the referral for: podiatry  Please go to the LAB in the Basement (turn left off the elevator) for the tests to be done today  You will be contacted by phone if any changes need to be made immediately.  Otherwise, you will receive a letter about your results with an explanation, but please check with MyChart first.  Please remember to sign up for MyChart if you have not done so, as this will be important to you in the future with finding out test results, communicating by private email, and scheduling acute appointments online when needed.  Please return in 6 months, or sooner if needed, with Lab testing done 3-5 days before

## 2015-02-14 ENCOUNTER — Ambulatory Visit: Payer: Medicare Other | Admitting: Podiatry

## 2015-02-21 ENCOUNTER — Other Ambulatory Visit: Payer: Self-pay | Admitting: Internal Medicine

## 2015-02-22 ENCOUNTER — Telehealth: Payer: Self-pay | Admitting: Internal Medicine

## 2015-02-22 NOTE — Telephone Encounter (Signed)
Patient states that's her current doctor who prescribes hydrocodone is out.   Would like to know if Dr. Jenny Reichmann could do this.

## 2015-02-22 NOTE — Telephone Encounter (Signed)
Please advise, would you be ok with doing so?

## 2015-02-22 NOTE — Telephone Encounter (Signed)
Normally, that MD who is is out should have another MD covering  Please ask if this is true, but if not i can do a rx  (except if she has pain contract for pain med, then I could not do this)

## 2015-02-24 ENCOUNTER — Telehealth: Payer: Self-pay

## 2015-02-24 ENCOUNTER — Ambulatory Visit (INDEPENDENT_AMBULATORY_CARE_PROVIDER_SITE_OTHER): Payer: Medicare Other

## 2015-02-24 VITALS — BP 138/68 | HR 64 | Ht 63.0 in | Wt 156.8 lb

## 2015-02-24 DIAGNOSIS — Z0000 Encounter for general adult medical examination without abnormal findings: Secondary | ICD-10-CM

## 2015-02-24 MED ORDER — OXYCODONE-ACETAMINOPHEN 5-325 MG PO TABS: 1.0000 | ORAL_TABLET | Freq: Four times a day (QID) | ORAL | Status: DC | PRN

## 2015-02-24 NOTE — Telephone Encounter (Signed)
Ok for [employer] 5/325 (not the 10/325) - Done hardcopy to [employer]

## 2015-02-24 NOTE — Telephone Encounter (Signed)
This patient in for AWV; Apt next week but worked her in. Request refill for (hydrocodone w acetaminophen 10/325) rx by Dr. Excell Seltzer; States she has been out x 3 days; gets "nervous" now in the am; "shaky", but was just in and did not get any med for "nerves". Dr. Excell Seltzer wrote pain rx, s/p colostomy takedown in Nov 2016; told the patient he cannot refill.   Appears she has been on some pain med in the past but can not determine what she took in 2016 prior to Nov surgery. Pain is for post hepatic pain and back pain. Is taking her Clinoril but is not taking her valium;   Please advise;  Tks
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
## 2015-02-24 NOTE — Patient Instructions (Addendum)
Kimberly Castillo , Thank you for taking time to come for your Medicare Wellness Visit. I appreciate your ongoing commitment to your health goals. Please review the following plan we discussed and let me know if I can assist you in the future.   Will have eye exam soon by Dr. Venetia Maxon  Declines flu and pneumonia;  Feeling anxious not sure why; would like norco filled    These are the goals we discussed: Goals    . patient     Would love to be able to clean up again; she enjoys housekeeping Live to take care of granddtr;         This is a list of the screening recommended for you and due dates:  Health Maintenance  Topic Date Due  . Eye exam for diabetics  82  . Pneumonia vaccines (2 of 2 - PPSV23) 12/19/2013  . Flu Shot  08/02/2014  . Hemoglobin A1C  07/26/2015  . Complete foot exam   01/26/2016  . Colon Cancer Screening  09/30/2016  . Tetanus Vaccine  09/20/2022  . DEXA scan (bone density measurement)  Completed  . Shingles Vaccine  Addressed     Fall Prevention in the Home  Falls can cause injuries. They can happen to people of all ages. There are many things you can do to make your home safe and to help prevent falls.  WHAT CAN I DO ON THE OUTSIDE OF MY HOME?  Regularly fix the edges of walkways and driveways and fix any cracks.  Remove anything that might make you trip as you walk through a door, such as a raised step or threshold.  Trim any bushes or trees on the path to your home.  Use bright outdoor lighting.  Clear any walking paths of anything that might make someone trip, such as rocks or tools.  Regularly check to see if handrails are loose or broken. Make sure that both sides of any steps have handrails.  Any raised decks and porches should have guardrails on the edges.  Have any leaves, snow, or ice cleared regularly.  Use sand or salt on walking paths during winter.  Clean up any spills in your garage right away. This includes oil or grease  spills. WHAT CAN I DO IN THE BATHROOM?   Use night lights.  Install grab bars by the toilet and in the tub and shower. Do not use towel bars as grab bars.  Use non-skid mats or decals in the tub or shower.  If you need to sit down in the shower, use a plastic, non-slip stool.  Keep the floor dry. Clean up any water that spills on the floor as soon as it happens.  Remove soap buildup in the tub or shower regularly.  Attach bath mats securely with double-sided non-slip rug tape.  Do not have throw rugs and other things on the floor that can make you trip. WHAT CAN I DO IN THE BEDROOM?  Use night lights.  Make sure that you have a light by your bed that is easy to reach.  Do not use any sheets or blankets that are too big for your bed. They should not hang down onto the floor.  Have a firm chair that has side arms. You can use this for support while you get dressed.  Do not have throw rugs and other things on the floor that can make you trip. WHAT CAN I DO IN THE KITCHEN?  Clean up any spills right  away.  Avoid walking on wet floors.  Keep items that you use a lot in easy-to-reach places.  If you need to reach something above you, use a strong step stool that has a grab bar.  Keep electrical cords out of the way.  Do not use floor polish or wax that makes floors slippery. If you must use wax, use non-skid floor wax.  Do not have throw rugs and other things on the floor that can make you trip. WHAT CAN I DO WITH MY STAIRS?  Do not leave any items on the stairs.  Make sure that there are handrails on both sides of the stairs and use them. Fix handrails that are broken or loose. Make sure that handrails are as long as the stairways.  Check any carpeting to make sure that it is firmly attached to the stairs. Fix any carpet that is loose or worn.  Avoid having throw rugs at the top or bottom of the stairs. If you do have throw rugs, attach them to the floor with carpet  tape.  Make sure that you have a light switch at the top of the stairs and the bottom of the stairs. If you do not have them, ask someone to add them for you. WHAT ELSE CAN I DO TO HELP PREVENT FALLS?  Wear shoes that:  Do not have high heels.  Have rubber bottoms.  Are comfortable and fit you well.  Are closed at the toe. Do not wear sandals.  If you use a stepladder:  Make sure that it is fully opened. Do not climb a closed stepladder.  Make sure that both sides of the stepladder are locked into place.  Ask someone to hold it for you, if possible.  Clearly mark and make sure that you can see:  Any grab bars or handrails.  First and last steps.  Where the edge of each step is.  Use tools that help you move around (mobility aids) if they are needed. These include:  Canes.  Walkers.  Scooters.  Crutches.  Turn on the lights when you go into a dark area. Replace any light bulbs as soon as they burn out.  Set up your furniture so you have a clear path. Avoid moving your furniture around.  If any of your floors are uneven, fix them.  If there are any pets around you, be aware of where they are.  Review your medicines with your doctor. Some medicines can make you feel dizzy. This can increase your chance of falling. Ask your doctor what other things that you can do to help prevent falls.   This information is not intended to replace advice given to you by your health care provider. Make sure you discuss any questions you have with your health care provider.   Document Released: 10/14/2008 Document Revised: 05/04/2014 Document Reviewed: 01/22/2014 Elsevier Interactive Patient Education 2016 Laurie Maintenance, Female Adopting a healthy lifestyle and getting preventive care can go a long way to promote health and wellness. Talk with your health care provider about what schedule of regular examinations is right for you. This is a good chance for you  to check in with your provider about disease prevention and staying healthy. In between checkups, there are plenty of things you can do on your own. Experts have done a lot of research about which lifestyle changes and preventive measures are most likely to keep you healthy. Ask your health care provider for more information.  WEIGHT AND DIET  Eat a healthy diet  Be sure to include plenty of vegetables, fruits, low-fat dairy products, and lean protein.  Do not eat a lot of foods high in solid fats, added sugars, or salt.  Get regular exercise. This is one of the most important things you can do for your health.  Most adults should exercise for at least 150 minutes each week. The exercise should increase your heart rate and make you sweat (moderate-intensity exercise).  Most adults should also do strengthening exercises at least twice a week. This is in addition to the moderate-intensity exercise.  Maintain a healthy weight  Body mass index (BMI) is a measurement that can be used to identify possible weight problems. It estimates body fat based on height and weight. Your health care provider can help determine your BMI and help you achieve or maintain a healthy weight.  For females 49 years of age and older:   A BMI below 18.5 is considered underweight.  A BMI of 18.5 to 24.9 is normal.  A BMI of 25 to 29.9 is considered overweight.  A BMI of 30 and above is considered obese.  Watch levels of cholesterol and blood lipids  You should start having your blood tested for lipids and cholesterol at 80 years of age, then have this test every 5 years.  You may need to have your cholesterol levels checked more often if:  Your lipid or cholesterol levels are high.  You are older than 80 years of age.  You are at high risk for heart disease.  CANCER SCREENING   Lung Cancer  Lung cancer screening is recommended for adults 3-62 years old who are at high risk for lung cancer because  of a history of smoking.  A yearly low-dose CT scan of the lungs is recommended for people who:  Currently smoke.  Have quit within the past 15 years.  Have at least a 30-pack-year history of smoking. A pack year is smoking an average of one pack of cigarettes a day for 1 year.  Yearly screening should continue until it has been 15 years since you quit.  Yearly screening should stop if you develop a health problem that would prevent you from having lung cancer treatment.  Breast Cancer  Practice breast self-awareness. This means understanding how your breasts normally appear and feel.  It also means doing regular breast self-exams. Let your health care provider know about any changes, no matter how small.  If you are in your 20s or 30s, you should have a clinical breast exam (CBE) by a health care provider every 1-3 years as part of a regular health exam.  If you are 26 or older, have a CBE every year. Also consider having a breast X-ray (mammogram) every year.  If you have a family history of breast cancer, talk to your health care provider about genetic screening.  If you are at high risk for breast cancer, talk to your health care provider about having an MRI and a mammogram every year.  Breast cancer gene (BRCA) assessment is recommended for women who have family members with BRCA-related cancers. BRCA-related cancers include:  Breast.  Ovarian.  Tubal.  Peritoneal cancers.  Results of the assessment will determine the need for genetic counseling and BRCA1 and BRCA2 testing. Cervical Cancer Your health care provider may recommend that you be screened regularly for cancer of the pelvic organs (ovaries, uterus, and vagina). This screening involves a pelvic examination, including checking  for microscopic changes to the surface of your cervix (Pap test). You may be encouraged to have this screening done every 3 years, beginning at age 65.  For women ages 28-65, health care  providers may recommend pelvic exams and Pap testing every 3 years, or they may recommend the Pap and pelvic exam, combined with testing for human papilloma virus (HPV), every 5 years. Some types of HPV increase your risk of cervical cancer. Testing for HPV may also be done on women of any age with unclear Pap test results.  Other health care providers may not recommend any screening for nonpregnant women who are considered low risk for pelvic cancer and who do not have symptoms. Ask your health care provider if a screening pelvic exam is right for you.  If you have had past treatment for cervical cancer or a condition that could lead to cancer, you need Pap tests and screening for cancer for at least 20 years after your treatment. If Pap tests have been discontinued, your risk factors (such as having a new sexual partner) need to be reassessed to determine if screening should resume. Some women have medical problems that increase the chance of getting cervical cancer. In these cases, your health care provider may recommend more frequent screening and Pap tests. Colorectal Cancer  This type of cancer can be detected and often prevented.  Routine colorectal cancer screening usually begins at 80 years of age and continues through 80 years of age.  Your health care provider may recommend screening at an earlier age if you have risk factors for colon cancer.  Your health care provider may also recommend using home test kits to check for hidden blood in the stool.  A small camera at the end of a tube can be used to examine your colon directly (sigmoidoscopy or colonoscopy). This is done to check for the earliest forms of colorectal cancer.  Routine screening usually begins at age 44.  Direct examination of the colon should be repeated every 5-10 years through 80 years of age. However, you may need to be screened more often if early forms of precancerous polyps or small growths are found. Skin  Cancer  Check your skin from head to toe regularly.  Tell your health care provider about any new moles or changes in moles, especially if there is a change in a mole's shape or color.  Also tell your health care provider if you have a mole that is larger than the size of a pencil eraser.  Always use sunscreen. Apply sunscreen liberally and repeatedly throughout the day.  Protect yourself by wearing long sleeves, pants, a wide-brimmed hat, and sunglasses whenever you are outside. HEART DISEASE, DIABETES, AND HIGH BLOOD PRESSURE   High blood pressure causes heart disease and increases the risk of stroke. High blood pressure is more likely to develop in:  People who have blood pressure in the high end of the normal range (130-139/85-89 mm Hg).  People who are overweight or obese.  People who are African American.  If you are 27-33 years of age, have your blood pressure checked every 3-5 years. If you are 25 years of age or older, have your blood pressure checked every year. You should have your blood pressure measured twice--once when you are at a hospital or clinic, and once when you are not at a hospital or clinic. Record the average of the two measurements. To check your blood pressure when you are not at a hospital or  clinic, you can use:  An automated blood pressure machine at a pharmacy.  A home blood pressure monitor.  If you are between 8 years and 48 years old, ask your health care provider if you should take aspirin to prevent strokes.  Have regular diabetes screenings. This involves taking a blood sample to check your fasting blood sugar level.  If you are at a normal weight and have a low risk for diabetes, have this test once every three years after 80 years of age.  If you are overweight and have a high risk for diabetes, consider being tested at a younger age or more often. PREVENTING INFECTION  Hepatitis B  If you have a higher risk for hepatitis B, you should be  screened for this virus. You are considered at high risk for hepatitis B if:  You were born in a country where hepatitis B is common. Ask your health care provider which countries are considered high risk.  Your parents were born in a high-risk country, and you have not been immunized against hepatitis B (hepatitis B vaccine).  You have HIV or AIDS.  You use needles to inject street drugs.  You live with someone who has hepatitis B.  You have had sex with someone who has hepatitis B.  You get hemodialysis treatment.  You take certain medicines for conditions, including cancer, organ transplantation, and autoimmune conditions. Hepatitis C  Blood testing is recommended for:  Everyone born from [birth year] through 1963.  Anyone with known risk factors for hepatitis C. Sexually transmitted infections (STIs)  You should be screened for sexually transmitted infections (STIs) including gonorrhea and chlamydia if:  You are sexually active and are younger than 80 years of age.  You are older than 80 years of age and your health care provider tells you that you are at risk for this type of infection.  Your sexual activity has changed since you were last screened and you are at an increased risk for chlamydia or gonorrhea. Ask your health care provider if you are at risk.  If you do not have HIV, but are at risk, it may be recommended that you take a prescription medicine daily to prevent HIV infection. This is called pre-exposure prophylaxis (PrEP). You are considered at risk if:  You are sexually active and do not regularly use condoms or know the HIV status of your partner(s).  You take drugs by injection.  You are sexually active with a partner who has HIV. Talk with your health care provider about whether you are at high risk of being infected with HIV. If you choose to begin PrEP, you should first be tested for HIV. You should then be tested every 3 months for as long as you are taking  PrEP.  PREGNANCY   If you are premenopausal and you may become pregnant, ask your health care provider about preconception counseling.  If you may become pregnant, take 400 to 800 micrograms (mcg) of folic acid every day.  If you want to prevent pregnancy, talk to your health care provider about birth control (contraception). OSTEOPOROSIS AND MENOPAUSE   Osteoporosis is a disease in which the bones lose minerals and strength with aging. This can result in serious bone fractures. Your risk for osteoporosis can be identified using a bone density scan.  If you are 57 years of age or older, or if you are at risk for osteoporosis and fractures, ask your health care provider if you should be screened.  Ask your health care provider whether you should take a calcium or vitamin D supplement to lower your risk for osteoporosis.  Menopause may have certain physical symptoms and risks.  Hormone replacement therapy may reduce some of these symptoms and risks. Talk to your health care provider about whether hormone replacement therapy is right for you.  HOME CARE INSTRUCTIONS   Schedule regular health, dental, and eye exams.  Stay current with your immunizations.   Do not use any tobacco products including cigarettes, chewing tobacco, or electronic cigarettes.  If you are pregnant, do not drink alcohol.  If you are breastfeeding, limit how much and how often you drink alcohol.  Limit alcohol intake to no more than 1 drink per day for nonpregnant women. One drink equals 12 ounces of beer, 5 ounces of wine, or 1 ounces of hard liquor.  Do not use street drugs.  Do not share needles.  Ask your health care provider for help if you need support or information about quitting drugs.  Tell your health care provider if you often feel depressed.  Tell your health care provider if you have ever been abused or do not feel safe at home.   This information is not intended to replace advice given  to you by your health care provider. Make sure you discuss any questions you have with your health care provider.   Document Released: 07/03/2010 Document Revised: 01/08/2014 Document Reviewed: 11/19/2012 Elsevier Interactive Patient Education [employer].

## 2015-02-25 NOTE — Telephone Encounter (Signed)
Medication put up front for patient to pick up patient aware.

## 2015-03-03 ENCOUNTER — Ambulatory Visit: Payer: Medicare Other

## 2015-03-22 DIAGNOSIS — H04123 Dry eye syndrome of bilateral lacrimal glands: Secondary | ICD-10-CM | POA: Diagnosis not present

## 2015-03-22 DIAGNOSIS — E119 Type 2 diabetes mellitus without complications: Secondary | ICD-10-CM | POA: Diagnosis not present

## 2015-03-22 DIAGNOSIS — H401131 Primary open-angle glaucoma, bilateral, mild stage: Secondary | ICD-10-CM | POA: Diagnosis not present

## 2015-04-19 ENCOUNTER — Other Ambulatory Visit: Payer: Self-pay | Admitting: Internal Medicine

## 2015-04-20 NOTE — Telephone Encounter (Signed)
Done hardcopy to Corinne  

## 2015-04-20 NOTE — Telephone Encounter (Signed)
Please advise 

## 2015-04-26 ENCOUNTER — Other Ambulatory Visit: Payer: Self-pay | Admitting: Internal Medicine

## 2015-05-25 ENCOUNTER — Other Ambulatory Visit: Payer: Self-pay | Admitting: Internal Medicine

## 2015-07-12 ENCOUNTER — Telehealth: Payer: Self-pay

## 2015-07-12 ENCOUNTER — Ambulatory Visit (INDEPENDENT_AMBULATORY_CARE_PROVIDER_SITE_OTHER): Payer: Medicare Other | Admitting: Internal Medicine

## 2015-07-12 ENCOUNTER — Encounter: Payer: Self-pay | Admitting: Internal Medicine

## 2015-07-12 VITALS — BP 140/76 | HR 78 | Temp 98.6°F | Resp 20 | Wt 159.0 lb

## 2015-07-12 DIAGNOSIS — G8929 Other chronic pain: Secondary | ICD-10-CM

## 2015-07-12 DIAGNOSIS — E119 Type 2 diabetes mellitus without complications: Secondary | ICD-10-CM | POA: Diagnosis not present

## 2015-07-12 DIAGNOSIS — M545 Low back pain, unspecified: Secondary | ICD-10-CM | POA: Insufficient documentation

## 2015-07-12 DIAGNOSIS — F411 Generalized anxiety disorder: Secondary | ICD-10-CM

## 2015-07-12 DIAGNOSIS — Z0001 Encounter for general adult medical examination with abnormal findings: Secondary | ICD-10-CM

## 2015-07-12 DIAGNOSIS — I10 Essential (primary) hypertension: Secondary | ICD-10-CM | POA: Diagnosis not present

## 2015-07-12 DIAGNOSIS — K2100 Gastro-esophageal reflux disease with esophagitis, without bleeding: Secondary | ICD-10-CM

## 2015-07-12 DIAGNOSIS — R6889 Other general symptoms and signs: Secondary | ICD-10-CM

## 2015-07-12 MED ORDER — LOSARTAN POTASSIUM-HCTZ 100-25 MG PO TABS: 1.0000 | ORAL_TABLET | Freq: Every day | ORAL | Status: DC

## 2015-07-12 MED ORDER — DILTIAZEM HCL ER 120 MG PO CP24: 120.0000 mg | ORAL_CAPSULE | Freq: Every morning | ORAL | Status: DC

## 2015-07-12 MED ORDER — POTASSIUM CHLORIDE ER 10 MEQ PO TBCR: EXTENDED_RELEASE_TABLET | ORAL | Status: DC

## 2015-07-12 MED ORDER — SIMVASTATIN 40 MG PO TABS: 40.0000 mg | ORAL_TABLET | Freq: Every day | ORAL | Status: DC

## 2015-07-12 MED ORDER — PANTOPRAZOLE SODIUM 40 MG PO TBEC: 40.0000 mg | DELAYED_RELEASE_TABLET | Freq: Every day | ORAL | Status: DC

## 2015-07-12 MED ORDER — METFORMIN HCL 500 MG PO TABS: 500.0000 mg | ORAL_TABLET | Freq: Two times a day (BID) | ORAL | Status: DC

## 2015-07-12 MED ORDER — DEXAMETHASONE 4 MG PO TABS: 2.0000 mg | ORAL_TABLET | Freq: Every day | ORAL | Status: DC | PRN

## 2015-07-12 MED ORDER — DIAZEPAM 5 MG PO TABS: ORAL_TABLET | ORAL | Status: DC

## 2015-07-12 MED ORDER — VALACYCLOVIR HCL 500 MG PO TABS: 500.0000 mg | ORAL_TABLET | Freq: Two times a day (BID) | ORAL | Status: DC

## 2015-07-12 MED ORDER — OXYCODONE-ACETAMINOPHEN 5-325 MG PO TABS: 1.0000 | ORAL_TABLET | Freq: Four times a day (QID) | ORAL | Status: DC | PRN

## 2015-07-12 NOTE — Telephone Encounter (Signed)
Medication refills sent to pharmacy 

## 2015-07-12 NOTE — Patient Instructions (Addendum)
Please continue all other medications as before, and refills have been done if requested.  Please have the pharmacy call with any other refills you may need.  Please continue your efforts at being more active, low cholesterol diet, and weight control.  You are otherwise up to date with prevention measures today.  Please keep your appointments with your specialists as you may have planned  Please return in 6 months, or sooner if needed, with Lab testing done 3-5 days before  

## 2015-07-12 NOTE — Progress Notes (Signed)
Subjective:    Patient ID: Kimberly Castillo, female    DOB: 03/30/1933, 82 y.o.   MRN: [id number]  HPI  Here to f/u; overall doing ok,  Pt denies  wheezing, orthopnea, PND, increased LE swelling, palpitations, or syncope, though has had some dizziness recently, eyes red, Does c/o ongoing fatigue, but denies signficant daytime hypersomnolence.  Just wants to sit after 3 steps.  "Just dont feel good."   Has been sob with exertion as well, and several fleeting chest pains in the context of "hurting all over"  "I feel so bored and dont want to do anything since I retired."  Pt denies new neurological symptoms such as new headache, or facial or extremity weakness or numbness.  Pt denies polydipsia, polyuria, or low sugar episode.   Pt denies new neurological symptoms such as new headache, or facial or extremity weakness or numbness.   Pt states overall good compliance with meds, mostly trying to follow appropriate diet, with wt overall stable,   BP Readings from Last 3 Encounters:  07/12/15 140/76  02/24/15 138/68  01/26/15 138/90   Wt Readings from Last 3 Encounters:  07/12/15 159 lb (72.122 kg)  02/24/15 156 lb 12 oz (71.101 kg)  01/26/15 161 lb (73.029 kg)  Mentions visited by insurance nurse who suggested she needed something different for pain, which has been ongoing for 10 yrs - Pt continues to have recurring LBP without change in severity, bowel or bladder change, fever, wt loss,  worsening LE pain/numbness/weakness, gait change or falls.  "i was told that if that pain ever hit the aorta I was gone."  Has been taking oxycodone-acet prn but not even one per day, 6-7 tabs left, sometimes gets by with 1/2 pill, can make her somewhat sleepy.  Denies worsening depressive symptoms, suicidal ideation, or panic; has ongoing anxiety, not increased recently, but admits it is an onogoing issue, needs valium refill.  To f/u with optho in about 10 for dilated exam, cont's timolol asd.  Has ongoing dry eye - for  restasis as well. Past Medical History  Diagnosis Date  . Glaucoma   . Diabetes mellitus ([place])   . Hypertension   . Hyperlipidemia   . Arthritis     hands, back  . History of shingles   . Cataracts, bilateral   . A-fib ([place])   . Post herpetic neuralgia 12/19/2012  . Chronic lower back pain 12/19/2012  . Arthritis, lumbar spine 12/19/2012  . Anxiety state 12/22/2013  . Heart murmur   . Headache     hx migraines  . Numbness     rt side of rt leg  . Dysrhythmia   . Diverticulitis    Past Surgical History  Procedure Laterality Date  . Incision and drainage breast abscess  1958    left  . Abdominal hysterectomy  1964    partial  . Colon resection N/A 09/02/2013    Procedure: [place] colectomy;  Surgeon: Excell Seltzer, MD;  Location: [facility];  Service: General;  Laterality: N/A;  . Colostomy takedown N/A 11/09/2014    Procedure: TAKEDOWN [place] COLOSTOMY;  Surgeon: Excell Seltzer, MD;  Location: [facility];  Service: General;  Laterality: N/A;    reports that she quit smoking about 17 years ago. Her smoking use included Cigarettes. She started smoking about 23 years ago. She has never used smokeless tobacco. She reports that she does not drink alcohol or use illicit drugs. family history includes Hypertension in her maternal grandmother and  mother. There is no history of Colon cancer or Stomach cancer. No Known Allergies Current Outpatient Prescriptions on File Prior to Visit  Medication Sig Dispense Refill  . aspirin EC 81 MG tablet Take 81 mg by mouth daily.    . bisacodyl (DULCOLAX) 5 MG EC tablet Take 5 mg by mouth as directed. TAKES THE DAY BEFORE SURGERY    . Cholecalciferol (VITAMIN D3) 5000 UNITS CAPS Take 1 capsule by mouth daily.    [name] diltiazem (CARDIZEM) 120 MG tablet TAKE ONE TABLET BY MOUTH ONCE DAILY 30 tablet 0  . latanoprost (XALATAN) 0.005 % ophthalmic solution Place 1 drop into both eyes at bedtime.    [name] neomycin (MYCIFRADIN) 500 MG tablet Take 500 mg by mouth  as directed. Reported on 02/24/2015    . polyethylene glycol (MIRALAX / GLYCOLAX) packet Take 17 g by mouth as directed. Takes the day before surgery    . timolol (BETIMOL) 0.5 % ophthalmic solution Place 1 drop into both eyes daily.     No current facility-administered medications on file prior to visit.   Review of Systems  Constitutional: Negative for unusual diaphoresis or night sweats HENT: Negative for ear swelling or discharge Eyes: Negative for worsening visual haziness  Respiratory: Negative for choking and stridor.   Gastrointestinal: Negative for distension or worsening eructation Genitourinary: Negative for retention or change in urine volume.  Musculoskeletal: Negative for other MSK pain or swelling Skin: Negative for color change and worsening wound Neurological: Negative for tremors and numbness other than noted  Psychiatric/Behavioral: Negative for decreased concentration or agitation other than above       Objective:   Physical Exam BP 140/76 mmHg  Pulse 78  Temp(Src) 98.6 F (37 C) (Oral)  Resp 20  Wt 159 lb (72.122 kg)  SpO2 98% VS noted,  Constitutional: Pt appears in no apparent distress HENT: Head: NCAT.  Right Ear: External ear normal.  Left Ear: External ear normal.  Eyes: . Pupils are equal, round, and reactive to light. Conjunctivae and EOM are normal Neck: Normal range of motion. Neck supple.  Cardiovascular: Normal rate and regular rhythm.   Pulmonary/Chest: Effort normal and breath sounds without rales or wheezing.  Abd:  Soft, NT, ND, + BS Neurological: Pt is alert. Not confused , motor grossly intact Skin: Skin is warm. No rash, no LE edema Psychiatric: Pt behavior is normal. No agitation. mild nervous Lumbar spine diffuse chronic midline tender    Assessment & Plan:

## 2015-07-12 NOTE — Progress Notes (Signed)
Pre visit review using our clinic review tool, if applicable. No additional management support is needed unless otherwise documented below in the visit note. 

## 2015-07-18 NOTE — Assessment & Plan Note (Signed)
stable overall by history and exam, recent data reviewed with pt, and pt to continue medical treatment as before,  to f/u any worsening symptoms or concerns Lab Results  Component Value Date   WBC 5.4 01/26/2015   HGB 11.4* 01/26/2015   HCT 35.1* 01/26/2015   PLT 222.0 01/26/2015   GLUCOSE 108* 01/26/2015   CHOL 198 01/26/2015   TRIG 178.0* 01/26/2015   HDL 49.40 01/26/2015   LDLDIRECT 88.0 06/24/2014   LDLCALC 113* 01/26/2015   ALT 10 01/26/2015   AST 15 01/26/2015   NA 141 01/26/2015   K 3.6 01/26/2015   CL 106 01/26/2015   CREATININE 1.35* 01/26/2015   BUN 28* 01/26/2015   CO2 26 01/26/2015   TSH 0.86 01/26/2015   HGBA1C 6.3 01/26/2015   MICROALBUR 61.8* 01/26/2015

## 2015-07-18 NOTE — Assessment & Plan Note (Signed)
stable overall by history and exam, recent data reviewed with pt, and pt to continue medical treatment as before,  to f/u any worsening symptoms or concerns Lab Results  Component Value Date   HGBA1C 6.3 01/26/2015

## 2015-07-18 NOTE — Assessment & Plan Note (Signed)
Mild, reassured, declines need for counseling, to f/u any worsening symptoms or concerns

## 2015-07-18 NOTE — Assessment & Plan Note (Signed)
stable overall by history and exam, recent data reviewed with pt, and pt to continue medical treatment as before,  to f/u any worsening symptoms or concerns BP Readings from Last 3 Encounters:  07/12/15 140/76  02/24/15 138/68  01/26/15 138/90

## 2015-07-18 NOTE — Assessment & Plan Note (Signed)
Stable, for pain med refill,  to f/u any worsening symptoms or concerns  

## 2015-07-28 ENCOUNTER — Other Ambulatory Visit: Payer: Self-pay

## 2015-07-28 MED ORDER — DILTIAZEM HCL 120 MG PO TABS: 120.0000 mg | ORAL_TABLET | Freq: Every day | ORAL | 0 refills | Status: DC

## 2015-08-25 ENCOUNTER — Ambulatory Visit (INDEPENDENT_AMBULATORY_CARE_PROVIDER_SITE_OTHER): Payer: Medicare Other | Admitting: Internal Medicine

## 2015-08-25 ENCOUNTER — Encounter: Payer: Self-pay | Admitting: Internal Medicine

## 2015-08-25 ENCOUNTER — Other Ambulatory Visit (INDEPENDENT_AMBULATORY_CARE_PROVIDER_SITE_OTHER): Payer: Medicare Other

## 2015-08-25 VITALS — BP 142/84 | HR 97 | Temp 98.4°F | Resp 20 | Wt 162.0 lb

## 2015-08-25 DIAGNOSIS — E785 Hyperlipidemia, unspecified: Secondary | ICD-10-CM | POA: Diagnosis not present

## 2015-08-25 DIAGNOSIS — Z0001 Encounter for general adult medical examination with abnormal findings: Secondary | ICD-10-CM

## 2015-08-25 DIAGNOSIS — E119 Type 2 diabetes mellitus without complications: Secondary | ICD-10-CM | POA: Diagnosis not present

## 2015-08-25 DIAGNOSIS — I10 Essential (primary) hypertension: Secondary | ICD-10-CM

## 2015-08-25 DIAGNOSIS — B0229 Other postherpetic nervous system involvement: Secondary | ICD-10-CM

## 2015-08-25 DIAGNOSIS — G8929 Other chronic pain: Secondary | ICD-10-CM

## 2015-08-25 DIAGNOSIS — M545 Low back pain, unspecified: Secondary | ICD-10-CM

## 2015-08-25 DIAGNOSIS — R6889 Other general symptoms and signs: Secondary | ICD-10-CM

## 2015-08-25 LAB — HEPATIC FUNCTION PANEL
ALBUMIN: 4.1 g/dL (ref 3.5–5.2)
ALT: 9 U/L (ref 0–35)
AST: 12 U/L (ref 0–37)
Alkaline Phosphatase: 66 U/L (ref 39–117)
BILIRUBIN DIRECT: 0 mg/dL (ref 0.0–0.3)
TOTAL PROTEIN: 7.4 g/dL (ref 6.0–8.3)
Total Bilirubin: 0.4 mg/dL (ref 0.2–1.2)

## 2015-08-25 LAB — LIPID PANEL
CHOLESTEROL: 190 mg/dL (ref 0–200)
HDL: 54.5 mg/dL (ref >39.00)
NONHDL: 135.69
TRIGLYCERIDES: 268 mg/dL — AB (ref 0.0–149.0)
Total CHOL/HDL Ratio: 3
VLDL: 53.6 mg/dL — ABNORMAL HIGH (ref 0.0–40.0)

## 2015-08-25 LAB — BASIC METABOLIC PANEL
BUN: 32 mg/dL — ABNORMAL HIGH (ref 6–23)
CHLORIDE: 109 meq/L (ref 96–112)
CO2: 29 meq/L (ref 19–32)
CREATININE: 1.5 mg/dL — AB (ref 0.40–1.20)
Calcium: 9.7 mg/dL (ref 8.4–10.5)
GFR: 42.91 mL/min — ABNORMAL LOW (ref >60.00)
GLUCOSE: 124 mg/dL — AB (ref 70–99)
Potassium: 3.9 mEq/L (ref 3.5–5.1)
Sodium: 146 mEq/L — ABNORMAL HIGH (ref 135–145)

## 2015-08-25 LAB — HEMOGLOBIN A1C: HEMOGLOBIN A1C: 6.2 % (ref 4.6–6.5)

## 2015-08-25 LAB — LDL CHOLESTEROL, DIRECT: Direct LDL: 104 mg/dL

## 2015-08-25 MED ORDER — DILTIAZEM HCL 30 MG PO TABS: 30.0000 mg | ORAL_TABLET | Freq: Four times a day (QID) | ORAL | 3 refills | Status: DC

## 2015-08-25 MED ORDER — TRAMADOL HCL 50 MG PO TABS: 50.0000 mg | ORAL_TABLET | Freq: Three times a day (TID) | ORAL | 2 refills | Status: DC | PRN

## 2015-08-25 MED ORDER — GABAPENTIN 100 MG PO CAPS: 100.0000 mg | ORAL_CAPSULE | Freq: Three times a day (TID) | ORAL | 5 refills | Status: DC

## 2015-08-25 MED ORDER — LOSARTAN POTASSIUM-HCTZ 100-25 MG PO TABS: 1.0000 | ORAL_TABLET | Freq: Every day | ORAL | 3 refills | Status: DC

## 2015-08-25 NOTE — Progress Notes (Signed)
Pre visit review using our clinic review tool, if applicable. No additional management support is needed unless otherwise documented below in the visit note. 

## 2015-08-25 NOTE — Patient Instructions (Signed)
Please take all new medication as prescribed - the gabapentin 100 mg three times per day  You can call in 1 wk for a higher dose if this lower dose does not help  OK to stop the Diltiazem capsules  Please take all new medication as prescribed - the diltiazem 30 mg tablets four times per day  Your fluid pill was refilled as this is included in the losartan - HCT medication  Please take all new medication as prescribed - the tramadol for pain  Please let me know if you change your mind about the referral to Neurology  Please go to the LAB in the Basement (turn left off the elevator) for the tests to be done today  You will be contacted by phone if any changes need to be made immediately.  Otherwise, you will receive a letter about your results with an explanation, but please check with MyChart first.  Please remember to sign up for MyChart if you have not done so, as this will be important to you in the future with finding out test results, communicating by private email, and scheduling acute appointments online when needed.  Please return in 6 months, or sooner if needed, with Lab testing done 3-5 days before

## 2015-08-25 NOTE — Progress Notes (Signed)
Subjective:    Patient ID: Kimberly Castillo, female    DOB: [birth year], 82 y.o.   MRN: [id number]  HPI  Here to f/u; overall doing ok,  Pt denies chest pain, increasing sob or doe, wheezing, orthopnea, PND, increased LE swelling, palpitations, dizziness or syncope.  Pt denies new neurological symptoms such as new headache, or facial or extremity weakness or numbness.  Pt denies polydipsia, polyuria, or low sugar episode.   Pt denies new neurological symptoms such as new headache, or facial or extremity weakness or numbness.   Pt states overall good compliance with meds, mostly trying to follow appropriate diet, with wt overall stable,  but little exercise however.  Has been taking valtrex but not helping PHN pain.  Does not like the cardizem capsules and will not take, asks for change to tablets.  Pt continues to have recurring LBP without change in severity, bowel or bladder change, fever, wt loss,  worsening LE pain/numbness/weakness, gait change or falls.  Past Medical History:  Diagnosis Date  . A-fib ([place])   . Anxiety state 12/22/2013  . Arthritis    hands, back  . Arthritis, lumbar spine 12/19/2012  . Cataracts, bilateral   . Chronic lower back pain 12/19/2012  . Diabetes mellitus ([place])   . Diverticulitis   . Dysrhythmia   . Glaucoma   . Headache    hx migraines  . Heart murmur   . History of shingles   . Hyperlipidemia   . Hypertension   . Numbness    rt side of rt leg  . Post herpetic neuralgia 12/19/2012   Past Surgical History:  Procedure Laterality Date  . ABDOMINAL HYSTERECTOMY  1964   partial  . COLON RESECTION N/A 09/02/2013   Procedure: [place] colectomy;  Surgeon: Excell Seltzer, MD;  [facility];  Service: General;  Laterality: N/A;  . COLOSTOMY TAKEDOWN N/A 11/09/2014   Procedure: TAKEDOWN [facility] [place] COLOSTOMY;  Surgeon: Excell Seltzer, MD;  Location: [facility];  Service: General;  Laterality: N/A;  . [place]   left    reports  that she quit smoking about 17 years ago. Her smoking use included Cigarettes. She started smoking about 23 years ago. She has never used smokeless tobacco. She reports that she does not drink alcohol or use drugs. family history includes Hypertension in her maternal grandmother and mother. No Known Allergies Current Outpatient Prescriptions on File Prior to Visit  Medication Sig Dispense Refill  . aspirin EC 81 MG tablet Take 81 mg by mouth daily.    . bisacodyl (DULCOLAX) 5 MG EC tablet Take 5 mg by mouth as directed. TAKES THE DAY BEFORE SURGERY    . Cholecalciferol (VITAMIN D3) 5000 UNITS CAPS Take 1 capsule by mouth daily.    [name] dexamethasone (DECADRON) 4 MG tablet Take 0.5-1 tablets (2-4 mg total) by mouth daily as needed (FOR BACK PAIN). 30 tablet 1  . diazepam (VALIUM) 5 MG tablet TAKE ONE-HALF TO ONE TABLET BY MOUTH ONCE DAILY AS NEEDED 30 tablet 2  . latanoprost (XALATAN) 0.005 % ophthalmic solution Place 1 drop into both eyes at bedtime.    . metFORMIN (GLUCOPHAGE) 500 MG tablet Take 1 tablet (500 mg total) by mouth 2 (two) times daily with a meal. 180 tablet 3  . oxyCODONE-acetaminophen (PERCOCET/ROXICET) 5-325 MG tablet Take 1 tablet by mouth every 6 (six) hours as needed for moderate pain. 50 tablet 0  . pantoprazole (PROTONIX) 40 MG tablet Take 1  tablet (40 mg total) by mouth daily. 90 tablet 3  . polyethylene glycol (MIRALAX / GLYCOLAX) packet Take 17 g by mouth as directed. Takes the day before surgery    . potassium chloride (K-DUR) 10 MEQ tablet 2 tabs by mouth per day 180 tablet 3  . simvastatin (ZOCOR) 40 MG tablet Take 1 tablet (40 mg total) by mouth at bedtime. DC any old scripts for same medication 90 tablet 4  . timolol (BETIMOL) 0.5 % ophthalmic solution Place 1 drop into both eyes daily.     No current facility-administered medications on file prior to visit.    Review of Systems  Constitutional: Negative for unusual diaphoresis or night sweats HENT: Negative for ear  swelling or discharge Eyes: Negative for worsening visual haziness  Respiratory: Negative for choking and stridor.   Gastrointestinal: Negative for distension or worsening eructation Genitourinary: Negative for retention or change in urine volume.  Musculoskeletal: Negative for other MSK pain or swelling Skin: Negative for color change and worsening wound Neurological: Negative for tremors and numbness other than noted  Psychiatric/Behavioral: Negative for decreased concentration or agitation other than above       Objective:   Physical Exam BP (!) 142/84   Pulse 97   Temp 98.4 F (36.9 C) (Oral)   Resp 20   Wt 162 lb (73.5 kg)   SpO2 94%   BMI 28.70 kg/m  VS noted,  Constitutional: Pt appears in no apparent distress HENT: Head: NCAT.  Right Ear: External ear normal.  Left Ear: External ear normal.  Eyes: . Pupils are equal, round, and reactive to light. Conjunctivae and EOM are normal Neck: Normal range of motion. Neck supple.  Cardiovascular: Normal rate and regular rhythm.   Pulmonary/Chest: Effort normal and breath sounds without rales or wheezing.  Abd:  Soft, NT, ND, + BS Neurological: Pt is alert. Not confused , motor grossly intact Skin: Skin is warm. No rash, no LE edema Psychiatric: Pt behavior is normal. No agitation.      Assessment & Plan:

## 2015-08-27 NOTE — Assessment & Plan Note (Signed)
Ok to d/c the valtrex, ok for gabapentin 100 tid, for 300 tid if not effective

## 2015-08-27 NOTE — Assessment & Plan Note (Signed)
Chronic gradual worsening,  no neuro changes,  to f/u any worsening symptoms or concerns

## 2015-08-27 NOTE — Assessment & Plan Note (Signed)
stable overall by history and exam, recent data reviewed with pt, and pt to continue medical treatment as before,  to f/u any worsening symptoms or concerns Lab Results  Component Value Date   HGBA1C 6.2 08/25/2015

## 2015-08-27 NOTE — Assessment & Plan Note (Signed)
stable overall by history and exam, recent data reviewed with pt, and pt to continue medical treatment as before,  to f/u any worsening symptoms or concerns Lab Results  Component Value Date   LDLCALC 113 (H) 01/26/2015

## 2015-08-27 NOTE — Assessment & Plan Note (Signed)
Pt refuses dilt capsules, to change to 30 qid tablets,  to f/u any worsening symptoms or concerns

## 2015-09-08 ENCOUNTER — Other Ambulatory Visit: Payer: Self-pay | Admitting: Internal Medicine

## 2015-10-12 ENCOUNTER — Other Ambulatory Visit: Payer: Self-pay | Admitting: Internal Medicine

## 2015-10-20 ENCOUNTER — Encounter: Payer: Self-pay | Admitting: Internal Medicine

## 2015-10-20 DIAGNOSIS — H401131 Primary open-angle glaucoma, bilateral, mild stage: Secondary | ICD-10-CM | POA: Diagnosis not present

## 2015-10-20 DIAGNOSIS — E119 Type 2 diabetes mellitus without complications: Secondary | ICD-10-CM | POA: Diagnosis not present

## 2015-10-20 DIAGNOSIS — H04123 Dry eye syndrome of bilateral lacrimal glands: Secondary | ICD-10-CM | POA: Diagnosis not present

## 2015-10-20 LAB — HM DIABETES EYE EXAM

## 2015-11-09 ENCOUNTER — Telehealth: Payer: Self-pay

## 2015-11-09 MED ORDER — TRAMADOL HCL 50 MG PO TABS: 50.0000 mg | ORAL_TABLET | Freq: Three times a day (TID) | ORAL | 5 refills | Status: DC | PRN

## 2015-11-09 MED ORDER — METFORMIN HCL 500 MG PO TABS: 500.0000 mg | ORAL_TABLET | Freq: Two times a day (BID) | ORAL | 3 refills | Status: DC

## 2015-11-09 MED ORDER — DIAZEPAM 5 MG PO TABS: ORAL_TABLET | ORAL | 5 refills | Status: DC

## 2015-11-09 MED ORDER — GABAPENTIN 100 MG PO CAPS: 100.0000 mg | ORAL_CAPSULE | Freq: Three times a day (TID) | ORAL | 5 refills | Status: DC

## 2015-11-09 MED ORDER — POTASSIUM CHLORIDE ER 10 MEQ PO TBCR: EXTENDED_RELEASE_TABLET | ORAL | 3 refills | Status: DC

## 2015-11-09 NOTE — Telephone Encounter (Signed)
Metformin, K, gabapentin done erx  Diazepam, tramadol Done hardcopy to [employer]

## 2015-11-09 NOTE — Addendum Note (Signed)
Addended by: Biagio Borg on: 11/09/2015 06:19 PM   Modules accepted: Orders

## 2015-11-09 NOTE — Telephone Encounter (Signed)
Please advise patient is requesting refill on metformin potassium, diazepam, gabapentin, and tramadol.

## 2015-11-10 NOTE — Telephone Encounter (Signed)
faxed

## 2015-11-13 ENCOUNTER — Other Ambulatory Visit: Payer: Self-pay | Admitting: Internal Medicine

## 2015-12-14 ENCOUNTER — Ambulatory Visit (INDEPENDENT_AMBULATORY_CARE_PROVIDER_SITE_OTHER): Payer: Medicare Other | Admitting: Orthopaedic Surgery

## 2015-12-14 ENCOUNTER — Encounter (INDEPENDENT_AMBULATORY_CARE_PROVIDER_SITE_OTHER): Payer: Self-pay | Admitting: Orthopaedic Surgery

## 2015-12-14 ENCOUNTER — Ambulatory Visit (INDEPENDENT_AMBULATORY_CARE_PROVIDER_SITE_OTHER): Payer: Medicare Other

## 2015-12-14 ENCOUNTER — Telehealth (INDEPENDENT_AMBULATORY_CARE_PROVIDER_SITE_OTHER): Payer: Self-pay | Admitting: *Deleted

## 2015-12-14 VITALS — BP 193/96 | HR 93 | Ht 63.0 in | Wt 163.0 lb

## 2015-12-14 DIAGNOSIS — M2021 Hallux rigidus, right foot: Secondary | ICD-10-CM

## 2015-12-14 DIAGNOSIS — M25561 Pain in right knee: Secondary | ICD-10-CM

## 2015-12-14 DIAGNOSIS — G8929 Other chronic pain: Secondary | ICD-10-CM

## 2015-12-14 DIAGNOSIS — M79671 Pain in right foot: Secondary | ICD-10-CM

## 2015-12-14 DIAGNOSIS — M4726 Other spondylosis with radiculopathy, lumbar region: Secondary | ICD-10-CM

## 2015-12-14 NOTE — Telephone Encounter (Signed)
Ic and left message with Vascular lab at [facility] to return my call to schedule doppler, pending call back

## 2015-12-14 NOTE — Addendum Note (Signed)
Addended by: Daylene Posey T on: 12/14/2015 03:45 PM   Modules accepted: Orders

## 2015-12-14 NOTE — Progress Notes (Signed)
Office Visit Note   Patient: Kimberly Castillo           Date of Birth: 09-13-1933           MRN: [id number] Visit Date: 12/14/2015              Requested by: Kimberly Castillo [place], [place] [id number] PCP: Kimberly Cower, Castillo   Assessment & Plan: Visit Diagnoses:  1. Pain in right foot   2. Other spondylosis with radiculopathy, lumbar region   3. Chronic pain of right knee   4. Hallux rigidus, right foot   Claudication. Neurogenic versus arterial.  Plan: We'll schedule patient for lumbar spine MRI to rule out HNP/stenosis and compared to previous study done 2015. Follow-up the office after completion to discuss results and further treatment options. We'll also schedule patient for bilateral lower extremity arterial Dopplers.  Follow-Up Instructions: Return Follow up after lumbar spine MRI to discuss results with Kimberly Castillo..   Orders:  Orders Placed This Encounter  Procedures  . XR Foot Complete Right  . XR Lumbar Spine Complete  . XR KNEE 3 VIEW RIGHT  . MR LUMBAR SPINE WO CONTRAST   No orders of the defined types were placed in this encounter.     Procedures: No procedures performed   Clinical Data: No additional findings.   Subjective: Chief Complaint  Patient presents with  . Lower Back - Pain  . Right Knee - Pain  . Right Foot - Pain    Kimberly Castillo is here for Castillo back, right knee and right foot. States that today Castillo back is doing fine that she has taken prednisone from Kimberly Castillo when she takes it. Castillo right knee, she is having pain at Castillo distal patella area and sometimes it will not bend, she has to message it to get it to move.  As far as Castillo right foot she has pain in the arch of Castillo foot and goes across top of Castillo foot.   Patient states that she's had low back pain for several years. Pain numbness and tingling also goes down Castillo right leg. Kimberly Castillo had previously followed patient for orthopedic issues.  She's had previous lumbar spine MRI scan that was done 07/26/2013 and report read: IMPRESSION: 1. Severe bilateral facet arthrosis at L4-5 with progressive listhesis resulting in mild spinal stenosis, bilateral lateral recess stenosis, and left greater than right neural foraminal stenosis. 2. Disc bulging and facet arthrosis at L5-S1 resulting in severe, right greater than left neural foraminal stenosis.  She states that Kimberly Castillo in Cast[place] to send Castillo for epidural steroid injections but she declined. She describes neurogenic claudication symptoms. Right knee pain with ambulating squatting and stairs. She also complains of pain at the right first MTP joint that has been ongoing for quite a while. Also pain when ambulating and with dorsiflexion of Castillo toe.   Review of Systems  Constitutional: Negative.   HENT: Negative.   Eyes: Negative.   Respiratory: Negative.   Musculoskeletal: Positive for back pain.  Skin: Negative.   Neurological: Positive for numbness (Right lower extremity).  Psychiatric/Behavioral: Negative.      Objective: Vital Signs: BP (!) 193/96 (BP Location: Left Arm, Patient Position: Sitting)   Pulse 93   Ht 5\' 3"  (1.6 m)   Wt 163 lb (73.9 kg)   BMI 28.87 kg/m   Physical Exam  Constitutional: She  is oriented to person, place, and time. No distress.  HENT:  Head: Normocephalic and atraumatic.  Eyes: EOM are normal. Pupils are equal, round, and reactive to light.  Neck: Normal range of motion.  Pulmonary/Chest: No respiratory distress.  Abdominal: She exhibits no distension.  Musculoskeletal:  Bilateral tenderness around the SI joints. Negative logroll. Negative sugars. Right knee shows good range of motion. Does have some patellofemoral crepitus. Tender medial plica. Medial joint line tender. Some knee swelling without significant palpable effusion. Right foot she is tender over the first MTP joint. She does have some dorsal spurs at the joint. Some  limitation with dorsiflexion  Neurological: She is alert and oriented to person, place, and time.  Skin: Skin is warm and dry.    Ortho Exam  Specialty Comments:  No specialty comments available.  Imaging: Xr Lumbar Spine Complete  Result Date: 12/14/2015 X-rays lumbar spine shows multilevel lumbar spondylosis. Grade 1-2 L4-5 spinal listhesis. L4-5 facet arthropathy. There is aortic calcification seen on lateral view. Impression multilevel lumbar spondylosis. L4-5 spondylolisthesis. No acute changes.  Xr Foot Complete Right  Result Date: 12/14/2015 X-ray right foot shows first MTP arthritic changes with dorsal Impression right first MTP DJD. No acute findings.  Xr Knee 3 View Right  Result Date: 12/14/2015 X-ray right knee shows some joint space narrowing but otherwise looks good for patient's age. She does have some arterial calcification seen on lateral view knee extending down into the calf. Impression right knee degenerative changes.    PMFS History: Patient Active Problem List   Diagnosis Date Noted  . Chronic low back pain 07/12/2015  . Bilateral foot pain 01/26/2015  . Colostomy status ([place]) 11/09/2014  . Rash 06/24/2014  . Preventative health care 12/31/2013  . Anxiety state 12/22/2013  . Yeast vaginitis 12/22/2013  . Diverticulitis of colon with perforation s/p colectomy/ostomy 09/02/2013 09/02/2013  . Castillo herpetic neuralgia 12/19/2012  . Arthritis, lumbar spine ([place]) 12/19/2012  . Glaucoma   . A-fib ([place])   . GERD (gastroesophageal reflux disease) 10/06/2012  . DM type 2 (diabetes mellitus, type 2) ([place]) 09/22/2012  . Hypertension 09/22/2012  . Hyperlipemia 09/22/2012   Past Medical History:  Diagnosis Date  . A-fib ([place])   . Anxiety state 12/22/2013  . Arthritis    hands, back  . Arthritis, lumbar spine ([place]) 12/19/2012  . Cataracts, bilateral   . Chronic lower back pain 12/19/2012  . Diabetes mellitus ([place])   . Diverticulitis   . Dysrhythmia   .  Glaucoma   . Headache    hx migraines  . Heart murmur   . History of shingles   . Hyperlipidemia   . Hypertension   . Numbness    rt side of rt leg  . Castillo herpetic neuralgia 12/19/2012    Family History  Problem Relation Age of Onset  . Colon cancer Neg Hx   . Stomach cancer Neg Hx   . Hypertension Mother   . Hypertension Maternal Grandmother     Past Surgical History:  Procedure Laterality Date  . ABDOMINAL HYSTERECTOMY  1964   partial  . COLON RESECTION N/A 09/02/2013   Procedure: [place] colectomy;  Surgeon: Excell Seltzer, [place]illo;  Location: [facility];  Service: General;  Laterality: N/A;  . COLOSTOMY TAKEDOWN N/A 11/09/2014   Procedure: TAKEDOWN [place] COLOSTOMY;  Surgeon: Excell Seltzer, [place];  Location: [facility];  Service: General;  Laterality: N/A;  . [place]   left   Social History  Occupational History  . Not on file.   Social History Main Topics  . Smoking status: Former Smoker    Types: Cigarettes    Start date: 09/17/1991    Quit date: 10/31/1997  . Smokeless tobacco: Never Used  . Alcohol use No  . Drug use: No  . Sexual activity: Yes

## 2015-12-14 NOTE — Addendum Note (Signed)
Addended by: Precious Bard on: 12/14/2015 01:17 PM   Modules accepted: Orders

## 2015-12-28 ENCOUNTER — Ambulatory Visit (HOSPITAL_COMMUNITY)
Admission: RE | Admit: 2015-12-28 | Discharge: 2015-12-28 | Disposition: A | Discharge status: Home / Self Care | Payer: Medicare Other | Source: Ambulatory Visit | Attending: Surgery | Admitting: Surgery

## 2015-12-28 DIAGNOSIS — M48061 Spinal stenosis, lumbar region without neurogenic claudication: Secondary | ICD-10-CM | POA: Diagnosis not present

## 2015-12-28 DIAGNOSIS — M1288 Other specific arthropathies, not elsewhere classified, other specified site: Secondary | ICD-10-CM | POA: Insufficient documentation

## 2015-12-28 DIAGNOSIS — M4726 Other spondylosis with radiculopathy, lumbar region: Secondary | ICD-10-CM | POA: Insufficient documentation

## 2015-12-28 DIAGNOSIS — M4316 Spondylolisthesis, lumbar region: Secondary | ICD-10-CM | POA: Insufficient documentation

## 2015-12-28 DIAGNOSIS — M4804 Spinal stenosis, thoracic region: Secondary | ICD-10-CM | POA: Insufficient documentation

## 2016-01-11 ENCOUNTER — Ambulatory Visit (INDEPENDENT_AMBULATORY_CARE_PROVIDER_SITE_OTHER): Payer: Medicare Other | Admitting: Orthopaedic Surgery

## 2016-02-01 ENCOUNTER — Ambulatory Visit (INDEPENDENT_AMBULATORY_CARE_PROVIDER_SITE_OTHER): Payer: Medicare Other | Admitting: Internal Medicine

## 2016-02-01 ENCOUNTER — Encounter (INDEPENDENT_AMBULATORY_CARE_PROVIDER_SITE_OTHER): Payer: Self-pay

## 2016-02-01 ENCOUNTER — Encounter: Payer: Self-pay | Admitting: Internal Medicine

## 2016-02-01 VITALS — BP 178/106 | HR 100 | Ht 61.0 in | Wt 166.0 lb

## 2016-02-01 DIAGNOSIS — K625 Hemorrhage of anus and rectum: Secondary | ICD-10-CM

## 2016-02-01 DIAGNOSIS — R159 Full incontinence of feces: Secondary | ICD-10-CM | POA: Diagnosis not present

## 2016-02-01 DIAGNOSIS — R197 Diarrhea, unspecified: Secondary | ICD-10-CM

## 2016-02-01 MED ORDER — HYOSCYAMINE SULFATE 0.125 MG SL SUBL: 0.1250 mg | SUBLINGUAL_TABLET | SUBLINGUAL | 1 refills | Status: DC | PRN

## 2016-02-01 MED ORDER — COLESTIPOL HCL 1 G PO TABS: 1.0000 g | ORAL_TABLET | Freq: Two times a day (BID) | ORAL | 3 refills | Status: DC

## 2016-02-01 NOTE — Progress Notes (Signed)
HISTORY OF PRESENT ILLNESS:  Kimberly Castillo is a 83 y.o. female  with multiple medical problems as listed below. She is sent today by Dr. Excell Seltzer regarding diarrhea and rectal bleeding and consideration of colonoscopy. The patient has a history of perforated diverticulitis September 2015 for which she underwent emergency [facility] colectomy. Underwent subsequent takedown November 2016. She saw Dr. Excell Seltzer December 2017 in his clinic with complaints of alternating bowel habits, incontinence, and minor rectal bleeding. The patient does have a history of adenomatous colon polyps and last underwent complete colonoscopy September 2013. At that time moderate diverticulosis with sigmoid stenosis and ascending colon polyp adenoma which was removed. Patient tells me that she will notice some fecal incontinence. She tries to treat this with enemas hoping to wash out all of the stool. Bowels tend to be loose for which she takes Imodium. This helps. She denies weight loss. She also complains of bloating and gas. Some nausea. Weight is been stable by her account  REVIEW OF SYSTEMS:  All non-GI ROS negative except for fatigue, arthritis, back pain, itching, shortness of breath, ankle swelling, sleeping problems  Past Medical History:  Diagnosis Date  . A-fib ([place])   . Anxiety state 12/22/2013  . Arthritis    hands, back  . Arthritis, lumbar spine ([place]) 12/19/2012  . Cataracts, bilateral   . Chronic lower back pain 12/19/2012  . Diabetes mellitus ([place])   . Diverticulitis   . Dysrhythmia   . Glaucoma   . Headache    hx migraines  . Heart murmur   . History of shingles   . Hyperlipidemia   . Hypertension   . Numbness    rt side of rt leg  . Post herpetic neuralgia 12/19/2012    Past Surgical History:  Procedure Laterality Date  . ABDOMINAL HYSTERECTOMY  1964   partial  . COLON RESECTION N/A 09/02/2013   Procedure: [facility] [place] colectomy;  Surgeon: Excell Seltzer, MD;  Location: [facility];  Service:  General;  Laterality: N/A;  . COLOSTOMY TAKEDOWN N/A 11/09/2014   Procedure: TAKEDOWN [facility] [place] COLOSTOMY;  Surgeon: Excell Seltzer, MD;  Location: [facility];  Service: General;  Laterality: N/A;  . INCISION AND DRAINAGE BREAST ABSCESS  1958   left    Social History Kimberly Castillo  reports that she quit smoking about 18 years ago. Her smoking use included Cigarettes. She started smoking about 24 years ago. She has never used smokeless tobacco. She reports that she does not drink alcohol or use drugs.  family history includes Hypertension in her maternal grandmother and mother.  No Known Allergies     PHYSICAL EXAMINATION: Vital signs: BP (!) 178/106 (BP Location: Left Arm, Patient Position: Sitting, Cuff Size: Normal)   Pulse 100   Ht 5\' 1"  (1.549 m) Comment: height measured without shoes  Wt 166 lb (75.3 kg)   BMI 31.37 kg/m   Constitutional: Pleasant elderly female, generally well-appearing, no acute distress Psychiatric: alert and oriented x3, cooperative Eyes: extraocular movements intact, anicteric, conjunctiva pink Mouth: oral pharynx moist, no lesions Neck: supple no lymphadenopathy Cardiovascular: heart regular rate and rhythm, no murmur Lungs: clear to auscultation bilaterally Abdomen: soft, nontender, nondistended, no obvious ascites, no peritoneal signs, normal bowel sounds, no organomegaly. Multiple surgical incisions well-healed Rectal: Deferred until colonoscopy Extremities: no clubbing or cyanosis. 1+ lower extremity edema bilaterally Skin: no lesions on visible extremities Neuro: No focal deficits. Normal DTRs. Cranial nerves intact  ASSESSMENT:  #1. Loose stools with incontinence #2. Minor rectal bleeding #  3. History of perforated diverticulitis requiring transient colostomy 2015 with subsequent takedown 2016 #4. History of adenomatous colon polyps 2013  #5. Gen. medical problems including type 2 diabetes   PLAN:  #1. Schedule colonoscopy with biopsies to  rule out microscopic colitis. The patient is high-risk given her age and comorbidities.The nature of the procedure, as well as the risks, benefits, and alternatives were carefully and thoroughly reviewed with the patient. Ample time for discussion and questions allowed. The patient understood, was satisfied, and agreed to proceed. #2. Instructed to wear protective undergarments #3. May benefit from daily fiber supplementation with Metamucil 2 tablespoons #4. Hold diabetic medications the day of the procedure  Please send a copy of this dictation to Dr. Excell Seltzer

## 2016-02-01 NOTE — Patient Instructions (Signed)
We will call you in the next couple of weeks to schedule you for your colonoscopy

## 2016-02-02 ENCOUNTER — Other Ambulatory Visit (INDEPENDENT_AMBULATORY_CARE_PROVIDER_SITE_OTHER): Payer: Medicare Other

## 2016-02-02 ENCOUNTER — Ambulatory Visit (INDEPENDENT_AMBULATORY_CARE_PROVIDER_SITE_OTHER): Payer: Medicare Other | Admitting: Internal Medicine

## 2016-02-02 VITALS — BP 140/80 | HR 93 | Temp 98.2°F | Resp 20 | Wt 166.0 lb

## 2016-02-02 DIAGNOSIS — R7989 Other specified abnormal findings of blood chemistry: Secondary | ICD-10-CM

## 2016-02-02 DIAGNOSIS — B0229 Other postherpetic nervous system involvement: Secondary | ICD-10-CM | POA: Diagnosis not present

## 2016-02-02 DIAGNOSIS — E119 Type 2 diabetes mellitus without complications: Secondary | ICD-10-CM

## 2016-02-02 DIAGNOSIS — Z0000 Encounter for general adult medical examination without abnormal findings: Secondary | ICD-10-CM

## 2016-02-02 LAB — CBC WITH DIFFERENTIAL/PLATELET
BASOS PCT: 1.2 % (ref 0.0–3.0)
Basophils Absolute: 0.1 10*3/uL (ref 0.0–0.1)
EOS PCT: 1.8 % (ref 0.0–5.0)
Eosinophils Absolute: 0.1 10*3/uL (ref 0.0–0.7)
HCT: 35 % — ABNORMAL LOW (ref 36.0–46.0)
Hemoglobin: 11.7 g/dL — ABNORMAL LOW (ref 12.0–15.0)
LYMPHS ABS: 2 10*3/uL (ref 0.7–4.0)
Lymphocytes Relative: 31.5 % (ref 12.0–46.0)
MCHC: 33.5 g/dL (ref 30.0–36.0)
MCV: 96.9 fl (ref 78.0–100.0)
MONO ABS: 0.7 10*3/uL (ref 0.1–1.0)
MONOS PCT: 11.5 % (ref 3.0–12.0)
NEUTROS ABS: 3.5 10*3/uL (ref 1.4–7.7)
Neutrophils Relative %: 54 % (ref 43.0–77.0)
Platelets: 209 10*3/uL (ref 150.0–400.0)
RBC: 3.61 Mil/uL — ABNORMAL LOW (ref 3.87–5.11)
RDW: 15.7 % — AB (ref 11.5–15.5)
WBC: 6.5 10*3/uL (ref 4.0–10.5)

## 2016-02-02 LAB — LIPID PANEL
CHOL/HDL RATIO: 5
Cholesterol: 239 mg/dL — ABNORMAL HIGH (ref 0–200)
HDL: 46.1 mg/dL (ref >39.00)
NONHDL: 193.04
Triglycerides: 376 mg/dL — ABNORMAL HIGH (ref 0.0–149.0)
VLDL: 75.2 mg/dL — AB (ref 0.0–40.0)

## 2016-02-02 LAB — MICROALBUMIN / CREATININE URINE RATIO
Creatinine,U: 451.8 mg/dL
MICROALB UR: 81.4 mg/dL — AB (ref 0.0–1.9)
Microalb Creat Ratio: 18 mg/g (ref 0.0–30.0)

## 2016-02-02 LAB — HEPATIC FUNCTION PANEL
ALT: 11 U/L (ref 0–35)
AST: 14 U/L (ref 0–37)
Albumin: 3.9 g/dL (ref 3.5–5.2)
Alkaline Phosphatase: 52 U/L (ref 39–117)
Bilirubin, Direct: 0.1 mg/dL (ref 0.0–0.3)
Total Bilirubin: 0.3 mg/dL (ref 0.2–1.2)
Total Protein: 7.1 g/dL (ref 6.0–8.3)

## 2016-02-02 LAB — TSH: TSH: 0.95 u[IU]/mL (ref 0.35–4.50)

## 2016-02-02 LAB — BASIC METABOLIC PANEL
BUN: 31 mg/dL — ABNORMAL HIGH (ref 6–23)
CALCIUM: 10 mg/dL (ref 8.4–10.5)
CO2: 29 mEq/L (ref 19–32)
Chloride: 107 mEq/L (ref 96–112)
Creatinine, Ser: 2.55 mg/dL — ABNORMAL HIGH (ref 0.40–1.20)
GFR: 23.23 mL/min — AB (ref >60.00)
Glucose, Bld: 145 mg/dL — ABNORMAL HIGH (ref 70–99)
POTASSIUM: 3.3 meq/L — AB (ref 3.5–5.1)
SODIUM: 145 meq/L (ref 135–145)

## 2016-02-02 LAB — URINALYSIS, ROUTINE W REFLEX MICROSCOPIC
Hgb urine dipstick: NEGATIVE
Ketones, ur: NEGATIVE
Leukocytes, UA: NEGATIVE
Nitrite: NEGATIVE
Specific Gravity, Urine: >=1.03 — AB (ref 1.000–1.030)
Total Protein, Urine: 100 — AB
Urine Glucose: NEGATIVE
Urobilinogen, UA: 0.2 (ref 0.0–1.0)
pH: 5.5 (ref 5.0–8.0)

## 2016-02-02 LAB — HEMOGLOBIN A1C: Hgb A1c MFr Bld: 6.3 % (ref 4.6–6.5)

## 2016-02-02 LAB — LDL CHOLESTEROL, DIRECT: Direct LDL: 129 mg/dL

## 2016-02-02 MED ORDER — GABAPENTIN 100 MG PO CAPS: 200.0000 mg | ORAL_CAPSULE | Freq: Three times a day (TID) | ORAL | 5 refills | Status: DC

## 2016-02-02 MED ORDER — LANCETS MISC: 11 refills | Status: DC

## 2016-02-02 MED ORDER — OXYCODONE-ACETAMINOPHEN 5-325 MG PO TABS: 1.0000 | ORAL_TABLET | Freq: Four times a day (QID) | ORAL | 0 refills | Status: DC | PRN

## 2016-02-02 MED ORDER — ONETOUCH ULTRA 2 W/DEVICE KIT: PACK | 0 refills | Status: DC

## 2016-02-02 MED ORDER — GLUCOSE BLOOD VI STRP: ORAL_STRIP | 12 refills | Status: DC

## 2016-02-02 NOTE — Progress Notes (Signed)
Pre visit review using our clinic review tool, if applicable. No additional management support is needed unless otherwise documented below in the visit note. 

## 2016-02-02 NOTE — Patient Instructions (Addendum)
Ok to increase the gabapentin to 2 pills three times per day  Please continue all other medications as before, and refills have been done if requested  - the percocet  Please make sure to see Dr Lorin Mercy as you have planned  Please have the pharmacy call with any other refills you may need.  Please continue your efforts at being more active, low cholesterol diet, and weight control.  You are otherwise up to date with prevention measures today.  Please keep your appointments with your specialists as you may have planned  Please go to the LAB in the Basement (turn left off the elevator) for the tests to be done today  You will be contacted by phone if any changes need to be made immediately.  Otherwise, you will receive a letter about your results with an explanation, but please check with MyChart first.  Please remember to sign up for MyChart if you have not done so, as this will be important to you in the future with finding out test results, communicating by private email, and scheduling acute appointments online when needed.  Please return in 6 months, or sooner if needed, with Lab testing done 3-5 days before
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
## 2016-02-03 ENCOUNTER — Encounter: Payer: Self-pay | Admitting: Internal Medicine

## 2016-02-03 ENCOUNTER — Ambulatory Visit (INDEPENDENT_AMBULATORY_CARE_PROVIDER_SITE_OTHER): Payer: Medicare Other | Admitting: Orthopaedic Surgery

## 2016-02-03 ENCOUNTER — Other Ambulatory Visit: Payer: Self-pay | Admitting: Internal Medicine

## 2016-02-03 ENCOUNTER — Encounter (INDEPENDENT_AMBULATORY_CARE_PROVIDER_SITE_OTHER): Payer: Self-pay | Admitting: Orthopaedic Surgery

## 2016-02-03 VITALS — BP 177/85 | HR 78 | Ht 63.0 in | Wt 163.0 lb

## 2016-02-03 DIAGNOSIS — N183 Chronic kidney disease, stage 3 unspecified: Secondary | ICD-10-CM

## 2016-02-03 DIAGNOSIS — G8929 Other chronic pain: Secondary | ICD-10-CM | POA: Diagnosis not present

## 2016-02-03 DIAGNOSIS — M545 Low back pain: Secondary | ICD-10-CM | POA: Diagnosis not present

## 2016-02-03 MED ORDER — PREDNISONE 5 MG PO TABS: 5.0000 mg | ORAL_TABLET | Freq: Every day | ORAL | 0 refills | Status: DC | PRN

## 2016-02-03 NOTE — Assessment & Plan Note (Signed)
stable overall by history and exam, recent data reviewed with pt, and pt to continue medical treatment as before,  to f/u any worsening symptoms or concerns Lab Results  Component Value Date   HGBA1C 6.3 02/02/2016

## 2016-02-03 NOTE — Assessment & Plan Note (Signed)
Uncontrolled, for incresaed gabapentin to 200 tid

## 2016-02-03 NOTE — Assessment & Plan Note (Signed)

## 2016-02-03 NOTE — Progress Notes (Signed)
Office Visit Note   Patient: Kimberly Castillo           Date of Birth: 21-Jul-1933           MRN: [id number] Visit Date: 02/03/2016              Requested by: Biagio Borg, MD [place], [place] [address] PCP: Cathlean Cower, MD   Assessment & Plan: Visit Diagnoses:  1. Chronic bilateral low back pain without sciatica     Plan: MRI is reviewed with patient images secondary workup with the report. She does not have progression and does have some degenerative spondylolisthesis with mild canal narrowing at L4-5. We discussed the epidural injection. With her other health problems and her age I do not think get lumbar decompression is needed particularly with only mild narrowing. We discussed the possible epidural injection 1 as a possible option. Recheck 4 weeks.  Follow-Up Instructions: Return in about 4 weeks (around 03/02/2016).   Orders:  No orders of the defined types were placed in this encounter.  Meds ordered this encounter  Medications  . predniSONE (DELTASONE) 5 MG tablet    Sig: Take 1 tablet (5 mg total) by mouth daily as needed.    Dispense:  20 tablet    Refill:  0      Procedures: No procedures performed   Clinical Data: No additional findings.   Subjective: Chief Complaint  Patient presents with  . Lower Back - Pain    Patient is here to review MRI Lumbar Spine. She continues to have low back pain and bilateral feet pain. She does complain of pain from shingles that she states she has all of the time.     Review of Systems  Constitutional: Negative for chills and diaphoresis.  HENT: Negative for ear discharge, ear pain and nosebleeds.   Eyes: Negative for discharge and visual disturbance.       Positive for glaucoma  Respiratory: Negative for cough, choking and shortness of breath.   Cardiovascular: Positive for leg swelling. Negative for chest pain and palpitations.       Positive for hypertension  Gastrointestinal: Negative for abdominal  distention and abdominal pain.       History of perforation with the ostomy  Endocrine: Negative for cold intolerance and heat intolerance.       Positive for diabetes  Genitourinary: Negative for flank pain and hematuria.  Musculoskeletal: Positive for arthralgias and back pain.       Shift aches with walking. She tires easily.  Skin: Negative for rash and wound.  Neurological: Negative for seizures and speech difficulty.  Hematological: Negative for adenopathy. Does not bruise/bleed easily.  Psychiatric/Behavioral: Negative for agitation and suicidal ideas.     Objective: Vital Signs: BP (!) 177/85   Pulse 78   Ht 5\' 3"  (1.6 m)   Wt 163 lb (73.9 kg)   BMI 28.87 kg/m   Physical Exam  Constitutional: She is oriented to person, place, and time. She appears well-developed.  HENT:  Head: Normocephalic.  Right Ear: External ear normal.  Left Ear: External ear normal.  Eyes: Pupils are equal, round, and reactive to light.  Neck: No tracheal deviation present. No thyromegaly present.  Cardiovascular: Normal rate.   Pulmonary/Chest: Effort normal.  Abdominal: Soft.  Positive for ostomy  Musculoskeletal:  Patient is good hip range of motion. Negative straight leg raising. Anterior tib EHL is active. She has good strength with resisted testing.  No plantar foot lesions.  Neurological: She is alert and oriented to person, place, and time.  Skin: Skin is warm and dry.  Psychiatric: She has a normal mood and affect. Her behavior is normal.    Ortho Exam  Specialty Comments:  No specialty comments available.  Imaging: No results found.  Study Result   CLINICAL DATA:  Back pain and lower extremity radiculopathy. RIGHT leg pain. Assess for lumbar impingement. History of diabetes, lumbar spine arthritis with chronic back pain.  EXAM: MRI LUMBAR SPINE WITHOUT CONTRAST  TECHNIQUE: Multiplanar, multisequence MR imaging of the lumbar spine was performed. No intravenous  contrast was administered.  COMPARISON:  Lumbar spine radiographs December 14, 2015 and MRI of the lumbar spine July 26, 2013  FINDINGS: SEGMENTATION: For the purposes of this report, the last well-formed intervertebral disc will be described as L5-S1.  ALIGNMENT: Maintenance of the lumbar lordosis. Stable grade 1 (5 mm) L4-5 anterolisthesis.  VERTEBRAE:Vertebral bodies are intact. Intervertebral discs demonstrate normal morphology, decreased T2 signal within all disc compatible with desiccation, unchanged. Multilevel mild subacute on chronic discogenic endplate changes. No abnormal bone marrow signal to suggest acute osseous process. Included view of the thoracic spine demonstrates moderate to severe progressed T11-12 disc height loss and desiccation associated with moderate subacute on chronic discogenic endplate changes, minimal acute component.  CONUS MEDULLARIS: Conus medullaris terminates at L1-2 and demonstrates normal morphology and signal characteristics. Cauda equina is normal.  PARASPINAL AND SOFT TISSUES: Included prevertebral and paraspinal soft tissues are normal.  DISC LEVELS:  T11-12: Moderate broad-based disc bulge, moderate facet arthropathy and ligamentum flavum redundancy. Mild canal stenosis. Severe RIGHT, moderate LEFT neural foraminal narrowing.  T12-L1 (no axial images): No disc bulge, canal stenosis nor neural foraminal narrowing.  L1-2 and L2-3: No disc bulge, canal stenosis nor neural foraminal narrowing. Mild facet arthropathy.  L3-4: Similar small broad-based disc bulge. Moderate facet arthropathy and ligamentum flavum redundancy without canal stenosis. Mild LEFT neural foraminal narrowing.  L4-5: Anterolisthesis. Small broad-based disc bulge. Severe facet arthropathy with ligamentum flavum redundancy. Mild canal stenosis. Moderate to severe LEFT greater than RIGHT neural foraminal narrowing.  L5-S1: Small broad-based disc  bulge. Severe RIGHT, moderate LEFT facet arthropathy with ligamentum flavum redundancy. No canal stenosis. Severe RIGHT, moderate to severe LEFT neural foraminal narrowing.  IMPRESSION: Similar grade 1 L4-5 anterolisthesis on degenerative basis, no spondylolysis.  Degenerative change of lumbar spine.  Mild canal stenosis L4-5.  Multilevel neural foraminal narrowing: Severe on the RIGHT at T11-12 and L5-S1.   Electronically Signed   By: Elon Alas M.D.   On: 12/28/2015 14:13    PMFS History: Patient Active Problem List   Diagnosis Date Noted  . Chronic low back pain 07/12/2015  . Bilateral foot pain 01/26/2015  . Colostomy status ([place]) 11/09/2014  . Rash 06/24/2014  . Preventative health care 12/31/2013  . Anxiety state 12/22/2013  . Yeast vaginitis 12/22/2013  . Diverticulitis of colon with perforation s/p colectomy/ostomy 09/02/2013 09/02/2013  . Post herpetic neuralgia 12/19/2012  . Arthritis, lumbar spine ([place]) 12/19/2012  . Glaucoma   . A-fib ([place])   . GERD (gastroesophageal reflux disease) 10/06/2012  . DM type 2 (diabetes mellitus, type 2) ([place]) 09/22/2012  . Hypertension 09/22/2012  . Hyperlipemia 09/22/2012   Past Medical History:  Diagnosis Date  . A-fib ([place])   . Anxiety state 12/22/2013  . Arthritis    hands, back  . Arthritis, lumbar spine ([place]) 12/19/2012  . Cataracts, bilateral   . Chronic lower  back pain 12/19/2012  . Diabetes mellitus ([place])   . Diverticulitis   . Dysrhythmia   . Glaucoma   . Headache    hx migraines  . Heart murmur   . History of shingles   . Hyperlipidemia   . Hypertension   . Numbness    rt side of rt leg  . Post herpetic neuralgia 12/19/2012    Family History  Problem Relation Age of Onset  . Hypertension Mother   . Hypertension Maternal Grandmother   . Colon cancer Neg Hx   . Stomach cancer Neg Hx     Past Surgical History:  Procedure Laterality Date  . ABDOMINAL HYSTERECTOMY  1964   partial  .  COLON RESECTION N/A 09/02/2013   Procedure: [facility] [place] colectomy;  Surgeon: Excell Seltzer, MD;  [facility];  Service: General;  Laterality: N/A;  . COLOSTOMY TAKEDOWN N/A 11/09/2014   Procedure: TAKEDOWN [facility] [place] COLOSTOMY;  Surgeon: Excell Seltzer, MD;  Location: [facility];  Service: General;  Laterality: N/A;  . INCISION AND DRAINAGE BREAST ABSCESS  1958   left   Social History   Occupational History  . Not on file.   Social History Main Topics  . Smoking status: Former Smoker    Types: Cigarettes    Start date: 09/17/1991    Quit date: 10/31/1997  . Smokeless tobacco: Never Used  . Alcohol use No  . Drug use: No  . Sexual activity: Yes

## 2016-02-07 ENCOUNTER — Ambulatory Visit: Payer: Medicare Other | Admitting: Internal Medicine

## 2016-02-09 ENCOUNTER — Other Ambulatory Visit: Payer: Medicare Other

## 2016-02-10 ENCOUNTER — Encounter: Payer: Self-pay | Admitting: Internal Medicine

## 2016-02-16 ENCOUNTER — Ambulatory Visit (INDEPENDENT_AMBULATORY_CARE_PROVIDER_SITE_OTHER): Payer: Medicare Other | Admitting: Nurse Practitioner

## 2016-02-16 ENCOUNTER — Other Ambulatory Visit (INDEPENDENT_AMBULATORY_CARE_PROVIDER_SITE_OTHER): Payer: Medicare Other

## 2016-02-16 ENCOUNTER — Encounter: Payer: Self-pay | Admitting: Nurse Practitioner

## 2016-02-16 VITALS — BP 170/80 | HR 99 | Temp 98.2°F | Ht 63.0 in | Wt 162.0 lb

## 2016-02-16 DIAGNOSIS — I10 Essential (primary) hypertension: Secondary | ICD-10-CM

## 2016-02-16 DIAGNOSIS — N183 Chronic kidney disease, stage 3 unspecified: Secondary | ICD-10-CM | POA: Insufficient documentation

## 2016-02-16 DIAGNOSIS — E876 Hypokalemia: Secondary | ICD-10-CM | POA: Diagnosis not present

## 2016-02-16 LAB — BASIC METABOLIC PANEL
BUN: 21 mg/dL (ref 6–23)
CHLORIDE: 106 meq/L (ref 96–112)
CO2: 32 mEq/L (ref 19–32)
Calcium: 9.9 mg/dL (ref 8.4–10.5)
Creatinine, Ser: 1.68 mg/dL — ABNORMAL HIGH (ref 0.40–1.20)
GFR: 37.6 mL/min — ABNORMAL LOW (ref >60.00)
Glucose, Bld: 115 mg/dL — ABNORMAL HIGH (ref 70–99)
POTASSIUM: 3 meq/L — AB (ref 3.5–5.1)
Sodium: 145 mEq/L (ref 135–145)

## 2016-02-16 MED ORDER — POTASSIUM CHLORIDE ER 10 MEQ PO TBCR: 10.0000 meq | EXTENDED_RELEASE_TABLET | Freq: Two times a day (BID) | ORAL | 0 refills | Status: DC

## 2016-02-16 MED ORDER — METOPROLOL SUCCINATE ER 50 MG PO TB24: 50.0000 mg | ORAL_TABLET | Freq: Every day | ORAL | 3 refills | Status: DC

## 2016-02-16 NOTE — Assessment & Plan Note (Signed)
Start metoprolol XR 50mg / Follow up in 62month. Hold losartan HCTZ due to declining kidney function.

## 2016-02-16 NOTE — Progress Notes (Signed)
Pre visit review using our clinic review tool, if applicable. No additional management support is needed unless otherwise documented below in the visit note. 

## 2016-02-16 NOTE — Assessment & Plan Note (Addendum)
Improved. Upcoming appt with nephrologist 02/20/16. Pending renal US. Maintain potassium supplement. Continue to hole losartan HCTZ.

## 2016-02-16 NOTE — Patient Instructions (Addendum)
Call [facility] to reschedule renal Ultrasound.  [facility]  Medical diagnostic imaging center in [place], [place]   Address: [address], [place], [place] [address]  Phone: [phone]   Scheduled with [facility] nephrology 02/[id number].  Continue to hold losartan HCTZ.

## 2016-02-16 NOTE — Progress Notes (Signed)
Subjective:  Patient ID: Kimberly Castillo, female    DOB: 05-06-1933  Age: 83 y.o. MRN: [id number]  CC: Follow-up (1 wk fu due to lab work. )   HPI  HTN: uncontrolled. Stopped taking losartan HCTZ due to declining kidney function. No worsening edema. No palpiatations, no Cp, no headache.  CKD: needs repeat BMP.  Outpatient Medications Prior to Visit  Medication Sig Dispense Refill  . aspirin EC 81 MG tablet Take 81 mg by mouth daily.    . Blood Glucose Monitoring Suppl (ONE TOUCH ULTRA 2) w/Device KIT USE as directed 1 each 0  . Cholecalciferol (VITAMIN D3) 5000 UNITS CAPS Take 1 capsule by mouth daily.    . colestipol (COLESTID) 1 g tablet Take 1 tablet (1 g total) by mouth 2 (two) times daily. 60 tablet 3  . diazepam (VALIUM) 5 MG tablet TAKE ONE-HALF TO ONE TABLET BY MOUTH ONCE DAILY AS NEEDED 30 tablet 5  . diltiazem (CARDIZEM) 120 MG tablet TAKE ONE TABLET BY MOUTH ONCE DAILY 90 tablet 1  . gabapentin (NEURONTIN) 100 MG capsule Take 2 capsules (200 mg total) by mouth 3 (three) times daily. 180 capsule 5  . glucose blood (ONE TOUCH ULTRA TEST) test strip Use as instructed 100 each 12  . hyoscyamine (LEVSIN SL) 0.125 MG SL tablet Place 1-2 tablets (0.125-0.25 mg total) under the tongue every 4 (four) hours as needed. 60 tablet 1  . Lancets MISC Use as directed once daily 100 each 11  . latanoprost (XALATAN) 0.005 % ophthalmic solution Place 1 drop into both eyes at bedtime.    . metFORMIN (GLUCOPHAGE) 500 MG tablet Take 1 tablet (500 mg total) by mouth 2 (two) times daily with a meal. 180 tablet 3  . oxyCODONE-acetaminophen (PERCOCET/ROXICET) 5-325 MG tablet Take 1 tablet by mouth every 6 (six) hours as needed for moderate pain. 50 tablet 0  . pantoprazole (PROTONIX) 40 MG tablet Take 1 tablet (40 mg total) by mouth daily. 90 tablet 3  . predniSONE (DELTASONE) 5 MG tablet Take 1 tablet (5 mg total) by mouth daily as needed. 20 tablet 0  . simvastatin (ZOCOR) 40 MG tablet Take 1 tablet  (40 mg total) by mouth at bedtime. DC any old scripts for same medication 90 tablet 4  . timolol (BETIMOL) 0.5 % ophthalmic solution Place 1 drop into both eyes daily.    . traMADol (ULTRAM) 50 MG tablet Take 1 tablet (50 mg total) by mouth every 8 (eight) hours as needed. 60 tablet 5  . dexamethasone (DECADRON) 4 MG tablet Take 0.5-1 tablets (2-4 mg total) by mouth daily as needed (FOR BACK PAIN). 30 tablet 1  . diltiazem (CARDIZEM) 30 MG tablet Take 1 tablet (30 mg total) by mouth 4 (four) times daily. 360 tablet 3  . losartan-hydrochlorothiazide (HYZAAR) 100-25 MG tablet Take 1 tablet by mouth daily. 90 tablet 3  . potassium chloride (K-DUR) 10 MEQ tablet 2 tabs by mouth per day 180 tablet 3  . polyethylene glycol (MIRALAX / GLYCOLAX) packet Take 17 g by mouth as directed. Takes the day before surgery     No facility-administered medications prior to visit.     ROS See HPI  Objective:  BP (!) 170/80   Pulse 99   Temp 98.2 F (36.8 C)   Ht _0  (1.6 m)   Wt 162 lb (73.5 kg)   SpO2 94%   BMI 28.70 kg/m   BP Readings from Last 3 Encounters:  02/16/16 [name])  170/80  02/03/16 (!) 177/85  02/02/16 140/80    Wt Readings from Last 3 Encounters:  02/16/16 162 lb (73.5 kg)  02/03/16 163 lb (73.9 kg)  02/02/16 166 lb (75.3 kg)    Physical Exam  Constitutional: She is oriented to person, place, and time. No distress.  Cardiovascular: Normal rate and normal heart sounds.   Pulmonary/Chest: Effort normal and breath sounds normal.  Musculoskeletal: She exhibits no edema.  Neurological: She is alert and oriented to person, place, and time.  Skin: Skin is warm and dry.  Vitals reviewed.   Lab Results  Component Value Date   WBC 6.5 02/02/2016   HGB 11.7 (L) 02/02/2016   HCT 35.0 (L) 02/02/2016   PLT 209.0 02/02/2016   GLUCOSE 115 (H) 02/16/2016   CHOL 239 (H) 02/02/2016   TRIG 376.0 (H) 02/02/2016   HDL 46.10 02/02/2016   LDLDIRECT 129.0 02/02/2016   LDLCALC 113 (H)  01/26/2015   ALT 11 02/02/2016   AST 14 02/02/2016   NA 145 02/16/2016   K 3.0 (L) 02/16/2016   CL 106 02/16/2016   CREATININE 1.68 (H) 02/16/2016   BUN 21 02/16/2016   CO2 32 02/16/2016   TSH 0.95 02/02/2016   HGBA1C 6.3 02/02/2016   MICROALBUR 81.4 (H) 02/02/2016    Mr Lumbar Spine Wo Contrast  Result Date: 12/28/2015 CLINICAL DATA:  Back pain and lower extremity radiculopathy. RIGHT leg pain. Assess for lumbar impingement. History of diabetes, lumbar spine arthritis with chronic back pain. EXAM: MRI LUMBAR SPINE WITHOUT CONTRAST TECHNIQUE: Multiplanar, multisequence MR imaging of the lumbar spine was performed. No intravenous contrast was administered. COMPARISON:  Lumbar spine radiographs December 14, 2015 and MRI of the lumbar spine July 26, 2013 FINDINGS: SEGMENTATION: For the purposes of this report, the last well-formed intervertebral disc will be described as L5-S1. ALIGNMENT: Maintenance of the lumbar lordosis. Stable grade 1 (5 mm) L4-5 anterolisthesis. VERTEBRAE:Vertebral bodies are intact. Intervertebral discs demonstrate normal morphology, decreased T2 signal within all disc compatible with desiccation, unchanged. Multilevel mild subacute on chronic discogenic endplate changes. No abnormal bone marrow signal to suggest acute osseous process. Included view of the thoracic spine demonstrates moderate to severe progressed T11-12 disc height loss and desiccation associated with moderate subacute on chronic discogenic endplate changes, minimal acute component. CONUS MEDULLARIS: Conus medullaris terminates at L1-2 and demonstrates normal morphology and signal characteristics. Cauda equina is normal. PARASPINAL AND SOFT TISSUES: Included prevertebral and paraspinal soft tissues are normal. DISC LEVELS: T11-12: Moderate broad-based disc bulge, moderate facet arthropathy and ligamentum flavum redundancy. Mild canal stenosis. Severe RIGHT, moderate LEFT neural foraminal narrowing. T12-L1 (no  axial images): No disc bulge, canal stenosis nor neural foraminal narrowing. L1-2 and L2-3: No disc bulge, canal stenosis nor neural foraminal narrowing. Mild facet arthropathy. L3-4: Similar small broad-based disc bulge. Moderate facet arthropathy and ligamentum flavum redundancy without canal stenosis. Mild LEFT neural foraminal narrowing. L4-5: Anterolisthesis. Small broad-based disc bulge. Severe facet arthropathy with ligamentum flavum redundancy. Mild canal stenosis. Moderate to severe LEFT greater than RIGHT neural foraminal narrowing. L5-S1: Small broad-based disc bulge. Severe RIGHT, moderate LEFT facet arthropathy with ligamentum flavum redundancy. No canal stenosis. Severe RIGHT, moderate to severe LEFT neural foraminal narrowing. IMPRESSION: Similar grade 1 L4-5 anterolisthesis on degenerative basis, no spondylolysis. Degenerative change of lumbar spine.  Mild canal stenosis L4-5. Multilevel neural foraminal narrowing: Severe on the RIGHT at T11-12 and L5-S1. Electronically Signed   By: Elon Alas M.D.   On: 12/28/2015 14:13  Assessment & Plan:   Cyncere was seen today for follow-up.  Diagnoses and all orders for this visit:  Essential hypertension -     Basic metabolic panel; Future -     metoprolol succinate (TOPROL-XL) 50 MG 24 hr tablet; Take 1 tablet (50 mg total) by mouth daily. Take with or immediately following a meal.  CKD (chronic kidney disease), stage III  Hypokalemia -     potassium chloride (K-DUR) 10 MEQ tablet; Take 1 tablet (10 mEq total) by mouth 2 (two) times daily. 2 tabs by mouth per day   I have discontinued Ms. [place]'s dexamethasone and losartan-hydrochlorothiazide. I have also changed her potassium chloride. Additionally, I am having her start on metoprolol succinate. Lastly, I am having her maintain her latanoprost, timolol, aspirin EC, Vitamin D3, polyethylene glycol, simvastatin, pantoprazole, metFORMIN, traMADol, diazepam, diltiazem, hyoscyamine,  colestipol, gabapentin, oxyCODONE-acetaminophen, glucose blood, ONE TOUCH ULTRA 2, Lancets, and predniSONE.  Meds ordered this encounter  Medications  . metoprolol succinate (TOPROL-XL) 50 MG 24 hr tablet    Sig: Take 1 tablet (50 mg total) by mouth daily. Take with or immediately following a meal.    Dispense:  30 tablet    Refill:  3    Order Specific Question:   Supervising Provider    Answer:   Cassandria Anger [[id number]]  . potassium chloride (K-DUR) 10 MEQ tablet    Sig: Take 1 tablet (10 mEq total) by mouth 2 (two) times daily. 2 tabs by mouth per day    Dispense:  60 tablet    Refill:  0    Order Specific Question:   Supervising Provider    Answer:   Cassandria Anger [[id number]]    Follow-up: Return in about 2 weeks (around 03/01/2016) for HTN.  Wilfred Lacy, NP

## 2016-03-01 ENCOUNTER — Ambulatory Visit: Payer: Medicare Other | Admitting: Internal Medicine

## 2016-03-01 DIAGNOSIS — Z0289 Encounter for other administrative examinations: Secondary | ICD-10-CM

## 2016-03-02 ENCOUNTER — Ambulatory Visit (INDEPENDENT_AMBULATORY_CARE_PROVIDER_SITE_OTHER): Payer: Medicare Other | Admitting: Orthopaedic Surgery

## 2016-03-02 ENCOUNTER — Encounter (INDEPENDENT_AMBULATORY_CARE_PROVIDER_SITE_OTHER): Payer: Self-pay | Admitting: Orthopaedic Surgery

## 2016-03-02 VITALS — BP 215/110 | HR 110 | Ht 63.0 in | Wt 163.0 lb

## 2016-03-02 DIAGNOSIS — M4316 Spondylolisthesis, lumbar region: Secondary | ICD-10-CM

## 2016-03-02 NOTE — Progress Notes (Addendum)
Office Visit Note   Patient: Kimberly Castillo           Date of Birth: 13-Dec-1933           MRN: [id number] Visit Date: 03/02/2016              Requested by: Biagio Borg, MD [address], [place] [address] PCP: Cathlean Cower, MD   Assessment & Plan: Visit Diagnoses:  1. Spondylolisthesis, lumbar region   L4-5 degenerative spondylolisthesis 3 mm with intact pars. Mild central narrowing and mild lateral recess narrowing.  Plan: She'll continue to try to be active and walk. She'll check on her blood pressure which was elevated today may be related to the prednisone she had been taking. She did note some improvement with the prednisone. She'll try to walk some and when she gets tired  She can  sit and rest .She will  try to avoid a lot of bending or twisting activities or break it up and do just a little bit each day. I'll check her back again in 6 months and on return we will obtain lateral flexion extension lumbar x-rays to evaluate the L4-5 level. If she has a significant increase in her pain she will return earlier. Patient relates that she thinks this is related to a accident she had which was an [place] in 1998. I discussed with her at this point I would not consider operative intervention. If her symptoms get worse she will call us know she related she gone shopping with her granddaughter and sometimes has to walk a little slow but still is able to go from store to store and complete all the shopping.  Follow-Up Instructions: Return in about 6 months (around 09/02/2016).   Orders:  No orders of the defined types were placed in this encounter.  No orders of the defined types were placed in this encounter.     Procedures: No procedures performed   Clinical Data: No additional findings.   Subjective: Chief Complaint  Patient presents with  . Lower Back - Pain    Patient returns for four week follow up low back pain. She states that she is no better. She does think that the  prednisone helped some, but states that it makes you fat. She states that she "doesn't feel well today". She is taking Tylenol ES and Excedrin prn daily with some relief.     Review of Systems 14 point review of systems is updated and is unchanged from 02/03/2016 office visit. Only changes and improvement with the prednisone and the elevation of blood pressure noted today.  Objective: Vital Signs: BP (!) 215/110   Pulse (!) 110   Ht 5\' 3"  (1.6 m)   Wt 163 lb (73.9 kg)   BMI 28.87 kg/m   Physical Exam  Constitutional: She is oriented to person, place, and time. She appears well-developed.  HENT:  Head: Normocephalic.  Right Ear: External ear normal.  Left Ear: External ear normal.  Eyes: Pupils are equal, round, and reactive to light.  Neck: No tracheal deviation present. No thyromegaly present.  Cardiovascular: Normal rate.   Pulmonary/Chest: Effort normal.  Abdominal: Soft.  Musculoskeletal:  Patient several get from sitting standing she has normal gait. She appears younger than her stated age. Reflexes are intact no isolated motor weakness. She has some sciatic notch tenderness mild trochanteric bursal tenderness. No pitting edema. Anterior tib quads are strong. Intact pulses.  Neurological: She is alert and  oriented to person, place, and time.  Skin: Skin is warm and dry.  Psychiatric: She has a normal mood and affect. Her behavior is normal.    Ortho Exam  Specialty Comments:  No specialty comments available.  Imaging: No results found.   PMFS History: Patient Active Problem List   Diagnosis Date Noted  . Spondylolisthesis, lumbar region 03/02/2016  . CKD (chronic kidney disease), stage III 02/16/2016  . Chronic low back pain 07/12/2015  . Bilateral foot pain 01/26/2015  . Colostomy status ([place]) 11/09/2014  . Rash 06/24/2014  . Preventative health care 12/31/2013  . Anxiety state 12/22/2013  . Yeast vaginitis 12/22/2013  . Diverticulitis of colon with  perforation s/p colectomy/ostomy 09/02/2013 09/02/2013  . Post herpetic neuralgia 12/19/2012  . Arthritis, lumbar spine ([place]) 12/19/2012  . Glaucoma   . A-fib ([facility] [place])   . GERD (gastroesophageal reflux disease) 10/06/2012  . DM type 2 (diabetes mellitus, type 2) ([place]) 09/22/2012  . Hypertension 09/22/2012  . Hyperlipemia 09/22/2012   Past Medical History:  Diagnosis Date  . A-fib ([place])   . Anxiety state 12/22/2013  . Arthritis    hands, back  . Arthritis, lumbar spine ([place]) 12/19/2012  . Cataracts, bilateral   . Chronic lower back pain 12/19/2012  . Diabetes mellitus ([facility])   . Diverticulitis   . Dysrhythmia   . Glaucoma   . Headache    hx migraines  . Heart murmur   . History of shingles   . Hyperlipidemia   . Hypertension   . Numbness    rt side of rt leg  . Post herpetic neuralgia 12/19/2012    Family History  Problem Relation Age of Onset  . Hypertension Mother   . Hypertension Maternal Grandmother   . Colon cancer Neg Hx   . Stomach cancer Neg Hx     Past Surgical History:  Procedure Laterality Date  . ABDOMINAL HYSTERECTOMY  1964   partial  . COLON RESECTION N/A 09/02/2013   Procedure: [place] colectomy;  Surgeon: Excell Seltzer, MD;  Location: [facility];  Service: General;  Laterality: N/A;  . COLOSTOMY TAKEDOWN N/A 11/09/2014   Procedure: TAKEDOWN [place] COLOSTOMY;  Surgeon: Excell Seltzer, MD;  Location: [facility];  Service: General;  Laterality: N/A;  . INCISION AND DRAINAGE BREAST ABSCESS  1958   left   Social History   Occupational History  . Not on file.   Social History Main Topics  . Smoking status: Former Smoker    Types: Cigarettes    Start date: 09/17/1991    Quit date: 10/31/1997  . Smokeless tobacco: Never Used  . Alcohol use No  . Drug use: No  . Sexual activity: Yes

## 2016-03-07 ENCOUNTER — Ambulatory Visit: Payer: Medicare Other | Admitting: Internal Medicine

## 2016-03-12 ENCOUNTER — Other Ambulatory Visit: Payer: Medicare Other

## 2016-03-16 ENCOUNTER — Encounter: Payer: Self-pay | Admitting: Internal Medicine

## 2016-03-16 ENCOUNTER — Ambulatory Visit
Admission: RE | Admit: 2016-03-16 | Discharge: 2016-03-16 | Disposition: A | Discharge status: Home / Self Care | Payer: Medicare Other | Source: Ambulatory Visit | Attending: Internal Medicine | Admitting: Internal Medicine

## 2016-03-16 DIAGNOSIS — N183 Chronic kidney disease, stage 3 unspecified: Secondary | ICD-10-CM

## 2016-04-05 ENCOUNTER — Ambulatory Visit (AMBULATORY_SURGERY_CENTER): Payer: Self-pay | Admitting: *Deleted

## 2016-04-05 ENCOUNTER — Encounter: Payer: Self-pay | Admitting: Internal Medicine

## 2016-04-05 VITALS — Ht 62.0 in | Wt 159.0 lb

## 2016-04-05 DIAGNOSIS — Z86010 Personal history of colonic polyps: Secondary | ICD-10-CM

## 2016-04-05 DIAGNOSIS — R197 Diarrhea, unspecified: Secondary | ICD-10-CM

## 2016-04-05 MED ORDER — NA SULFATE-K SULFATE-MG SULF 17.5-3.13-1.6 GM/177ML PO SOLN: 1.0000 | Freq: Once | ORAL | 0 refills | Status: AC

## 2016-04-05 NOTE — Progress Notes (Signed)
No egg or soy allergy known to patient  No issues with past sedation with any surgeries  or procedures, no intubation problems  No diet pills per patient No home 02 use per patient  No blood thinners per patient  Pt denies issues with constipation  Past hx of  A fib  But no  A flutter - no recent a fib issues per pt.  No coupon needed for pt's prep  Pt has no e mail for [employer] video

## 2016-04-09 ENCOUNTER — Other Ambulatory Visit: Payer: Self-pay | Admitting: Internal Medicine

## 2016-04-19 ENCOUNTER — Ambulatory Visit (AMBULATORY_SURGERY_CENTER): Payer: Medicare Other | Admitting: Internal Medicine

## 2016-04-19 ENCOUNTER — Encounter: Payer: Self-pay | Admitting: Internal Medicine

## 2016-04-19 VITALS — BP 128/63 | HR 69 | Temp 98.9°F | Resp 16 | Ht 62.0 in | Wt 159.0 lb

## 2016-04-19 DIAGNOSIS — R197 Diarrhea, unspecified: Secondary | ICD-10-CM | POA: Diagnosis not present

## 2016-04-19 DIAGNOSIS — Z86010 Personal history of colonic polyps: Secondary | ICD-10-CM

## 2016-04-19 DIAGNOSIS — R194 Change in bowel habit: Secondary | ICD-10-CM

## 2016-04-19 MED ORDER — SODIUM CHLORIDE 0.9 % IV SOLN: 500.0000 mL | INTRAVENOUS | Status: DC

## 2016-04-19 NOTE — Progress Notes (Signed)
Pt's abdomen soft and easily palpable.  No c/o pain or needing to pass air.  Told pt to ambulate and drink warm fluids if she feels cramping; understanding voiced

## 2016-04-19 NOTE — Op Note (Signed)
[facility] Patient Name: Kimberly Castillo Procedure Date: 04/19/2016 10:37 AM MRN: [id number] Endoscopist: Docia Chuck. Henrene Pastor , MD Age: 83 Referring MD:  Date of Birth: 23-Jun-1933 Gender: Female Account #: [id number] Procedure:                Colonoscopy Indications:              Rectal bleeding, Change in bowel habits, Fecal                            incontinence. Now reports to me that she has                            occasional firm stools. We will use enemas. Has a                            history of adenomatous colon polyps. Last exam                            2013. Status post left hemicolectomy with colostomy                            and subsequent takedown for perforated                            diverticulitis 2016/2017 Medicines:                Monitored Anesthesia Care Procedure:                Pre-Anesthesia Assessment:                           - Prior to the procedure, a History and Physical                            was performed, and patient medications and                            allergies were reviewed. The patient's tolerance of                            previous anesthesia was also reviewed. The risks                            and benefits of the procedure and the sedation                            options and risks were discussed with the patient.                            All questions were answered, and informed consent                            was obtained. Prior Anticoagulants: The patient has  taken no previous anticoagulant or antiplatelet                            agents. ASA Grade Assessment: II - A patient with                            mild systemic disease. After reviewing the risks                            and benefits, the patient was deemed in                            satisfactory condition to undergo the procedure.                           After obtaining informed consent, the colonoscope                    was passed under direct vision. Throughout the                            procedure, the patient's blood pressure, pulse, and                            oxygen saturations were monitored continuously. The                            Colonoscope was introduced through the anus and                            advanced to the the cecum, identified by                            appendiceal orifice and ileocecal valve. The                            ileocecal valve, appendiceal orifice, and rectum                            were photographed. The quality of the bowel                            preparation was good. The colonoscopy was performed                            without difficulty. The patient tolerated the                            procedure well. The bowel preparation used was                            SUPREP. Scope In: 10:59:24 AM Scope Out: 11:04:39 AM Scope Withdrawal Time: 0 hours 4 minutes 35 seconds  Total Procedure Duration: 0 hours 5 minutes 15 seconds  Findings:  The patient is status post generous left                            hemicolectomy with colocolonic anastomosis distally                            at 8 cm from the anal verge.A few small-mouthed                            diverticula were found in the neo-left colon.                           The exam was otherwise without abnormality on                            direct views. No attempt at retroflexion secondary                            to narrowed rectal vault surgery. Antegrade views                            from the anal verge unremarkable. Complications:            No immediate complications. Estimated blood loss:                            None. Estimated Blood Loss:     Estimated blood loss: none. Impression:               - Diverticulosis in the neo-left colon.                           - The examination was otherwise normal status post                             generous left hemicolectomy with distal anastomosis                            as described. Biopsies not felt necessary.                           - Suspect issues with bowel secondary to altered                            anatomy. Recommendation:           1. RECOMMEND METAMUCIL 2 TABLESPOONS DAILY TO                            IMPROVE BOWEL CONSISTENCY                           2. Wear protective undergarments to guard against                            incontinence  3. Avoid enemas.                           4. Resume previous diet.                           5. Continue present medications.                           6. return to the care of your primary provider. GI                            follow-up as needed Docia Chuck. Henrene Pastor, MD 04/19/2016 11:15:01 AM This report has been signed electronically. CC Letter to:             Excell Seltzer MD,

## 2016-04-19 NOTE — Patient Instructions (Signed)
YOU HAD AN ENDOSCOPIC PROCEDURE TODAY AT [facility]:   Refer to the procedure report that was given to you for any specific questions about what was found during the examination.  If the procedure report does not answer your questions, please call your gastroenterologist to clarify.  If you requested that your care partner not be given the details of your procedure findings, then the procedure report has been included in a sealed envelope for you to review at your convenience later.  YOU SHOULD EXPECT: Some feelings of bloating in the abdomen. Passage of more gas than usual.  Walking can help get rid of the air that was put into your GI tract during the procedure and reduce the bloating. If you had a lower endoscopy (such as a colonoscopy or flexible sigmoidoscopy) you may notice spotting of blood in your stool or on the toilet paper. If you underwent a bowel prep for your procedure, you may not have a normal bowel movement for a few days.  Please Note:  You might notice some irritation and congestion in your nose or some drainage.  This is from the oxygen used during your procedure.  There is no need for concern and it should clear up in a day or so.  SYMPTOMS TO REPORT IMMEDIATELY:   Following lower endoscopy (colonoscopy or flexible sigmoidoscopy):  Excessive amounts of blood in the stool  Significant tenderness or worsening of abdominal pains  Swelling of the abdomen that is new, acute  Fever of 100F or higher  For urgent or emergent issues, a gastroenterologist can be reached at any hour by calling [phone].   DIET:  We do recommend a small meal at first, but then you may proceed to your regular diet.  Drink plenty of fluids but you should avoid alcoholic beverages for 24 hours.  ACTIVITY:  You should plan to take it easy for the rest of today and you should NOT DRIVE or use heavy machinery until tomorrow (because of the sedation medicines used during the test).     FOLLOW UP: Our staff will call the number listed on your records the next business day following your procedure to check on you and address any questions or concerns that you may have regarding the information given to you following your procedure. If we do not reach you, we will leave a message.  However, if you are feeling well and you are not experiencing any problems, there is no need to return our call.  We will assume that you have returned to your regular daily activities without incident.  SIGNATURES/CONFIDENTIALITY: You and/or your care partner have signed paperwork which will be entered into your electronic medical record.  These signatures attest to the fact that that the information above on your After Visit Summary has been reviewed and is understood.  Full responsibility of the confidentiality of this discharge information lies with you and/or your care-partner.  Dr. Henrene Pastor recommends Metamucil 2 tablespoons daily to improve bowel consistency  Wear protective undergarments  Avoid enemas  Continue your normal medications  Please follow up with family doctor as needed  Please read over handouts about diverticulosis and high fiber diets

## 2016-04-19 NOTE — Progress Notes (Signed)
Report to PACU, RN, vss, BBS= Clear.  

## 2016-04-20 ENCOUNTER — Telehealth: Payer: Self-pay

## 2016-04-20 NOTE — Telephone Encounter (Signed)
  Follow up Call-  Call back number 04/19/2016  Post procedure Call Back phone  # [phone]  Permission to leave phone message Yes  Some recent data might be hidden     Patient questions:  Do you have a fever, pain , or abdominal swelling? No. Pain Score  0 *  Have you tolerated food without any problems? Yes.    Have you been able to return to your normal activities? Yes.    Do you have any questions about your discharge instructions: Diet   No. Medications  No. Follow up visit  No.  Do you have questions or concerns about your Care? No.  Actions: * If pain score is 4 or above: No action needed, pain <4.

## 2016-05-30 ENCOUNTER — Other Ambulatory Visit (INDEPENDENT_AMBULATORY_CARE_PROVIDER_SITE_OTHER): Payer: Self-pay | Admitting: Orthopaedic Surgery

## 2016-05-30 ENCOUNTER — Other Ambulatory Visit: Payer: Self-pay | Admitting: Internal Medicine

## 2016-05-31 NOTE — Telephone Encounter (Signed)
Done hardcopy to Shirron  

## 2016-05-31 NOTE — Telephone Encounter (Signed)
Does not look like pt asked for this. Surescripts just sending. Made her BP go up and gain weight. Denied.

## 2016-05-31 NOTE — Telephone Encounter (Signed)
[facility]?

## 2016-05-31 NOTE — Telephone Encounter (Signed)
faxed

## 2016-07-06 ENCOUNTER — Other Ambulatory Visit: Payer: Self-pay | Admitting: Internal Medicine

## 2016-07-10 NOTE — Telephone Encounter (Signed)
Faxed

## 2016-07-10 NOTE — Telephone Encounter (Signed)
Done hardcopy to Shirron  

## 2016-07-26 IMAGING — CR DG CHEST 1V PORT
1 series · 1 of 1 positions shown · non-contrast
Comparison: 09/01/2013

CLINICAL DATA: Increasing shortness of breath.  Productive cough

EXAM:
PORTABLE CHEST 1 VIEW

[AP]
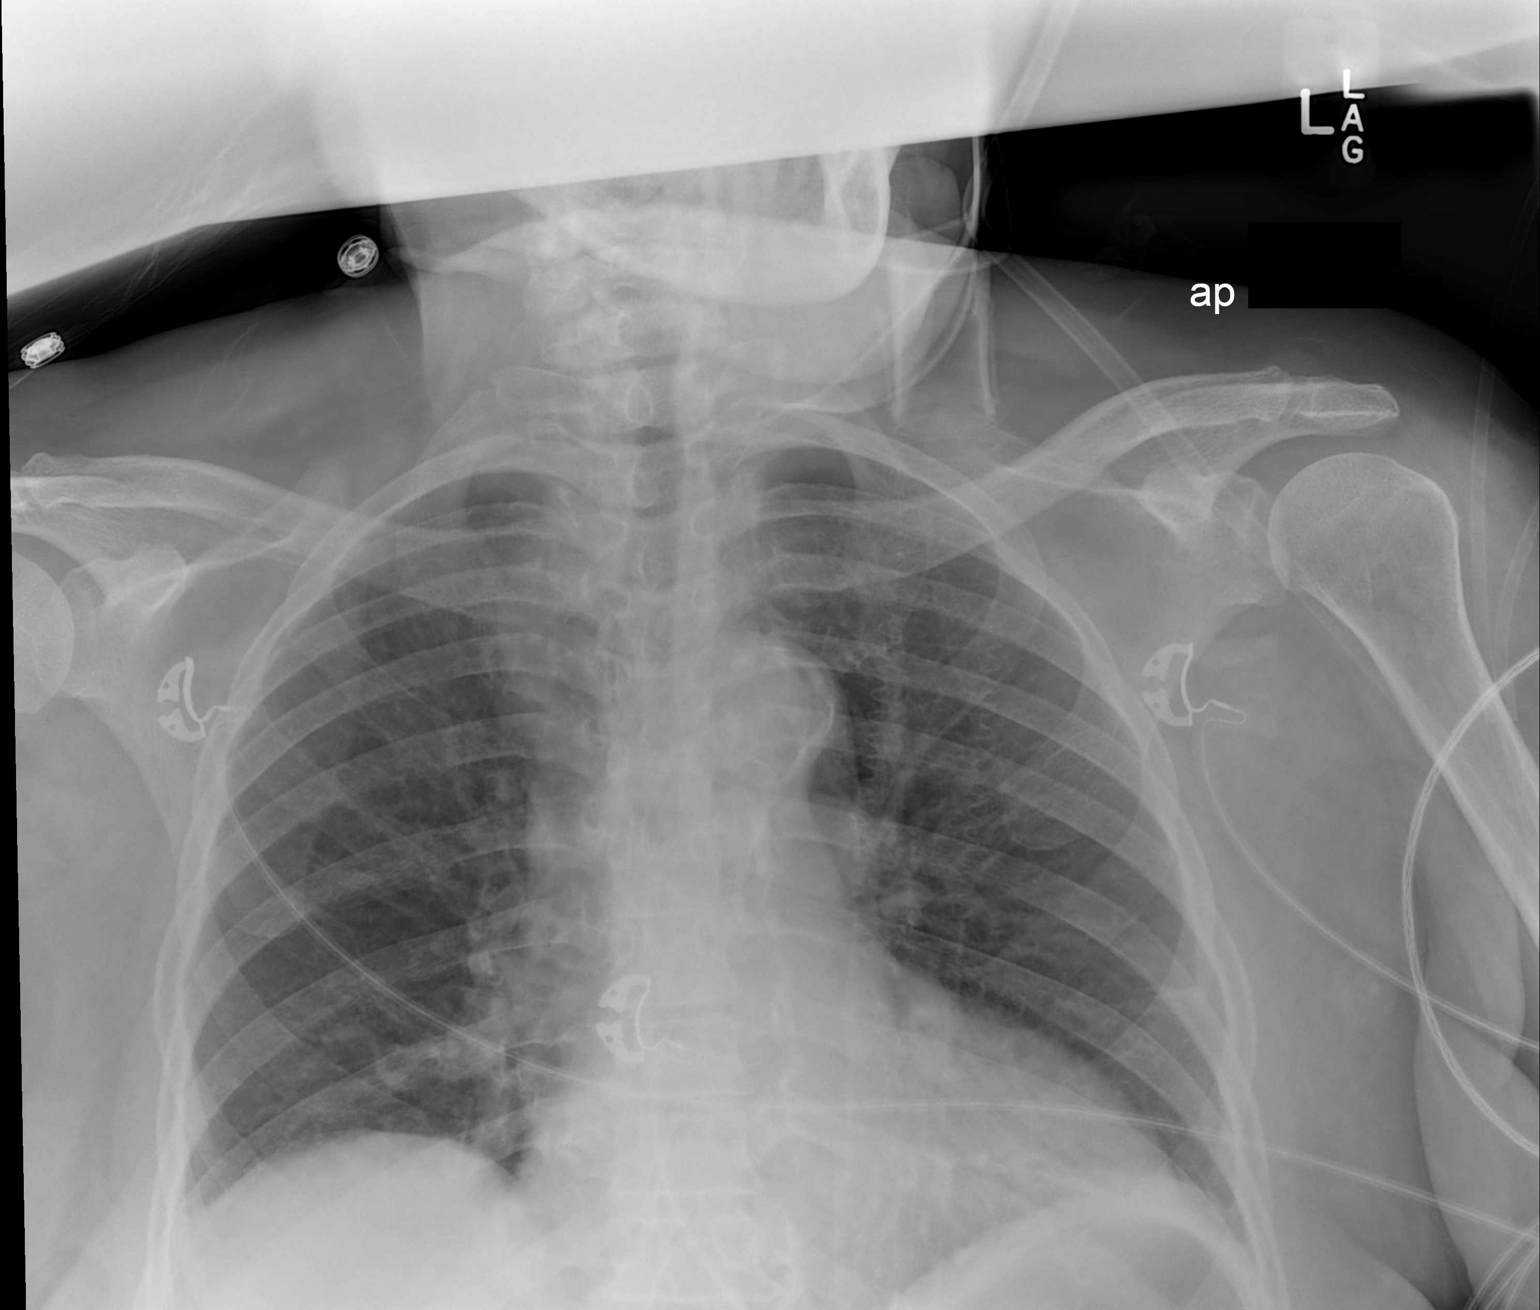

[1 of 1 positions shown; findings below may reference images not displayed]

FINDINGS: Mild hypoventilation with subsegmental atelectasis at the left base.
No edema, effusion, or consolidative opacity. Chronic borderline
cardiomegaly. Stable aortic and hilar contours.
IMPRESSION: Hypoventilation with mild left basilar atelectasis.

## 2016-08-10 ENCOUNTER — Other Ambulatory Visit (INDEPENDENT_AMBULATORY_CARE_PROVIDER_SITE_OTHER): Payer: Medicare Other

## 2016-08-10 ENCOUNTER — Encounter: Payer: Self-pay | Admitting: Internal Medicine

## 2016-08-10 ENCOUNTER — Ambulatory Visit (INDEPENDENT_AMBULATORY_CARE_PROVIDER_SITE_OTHER): Payer: Medicare Other | Admitting: Internal Medicine

## 2016-08-10 VITALS — BP 140/92 | HR 89 | Ht 62.0 in | Wt 154.0 lb

## 2016-08-10 DIAGNOSIS — M545 Low back pain: Secondary | ICD-10-CM | POA: Diagnosis not present

## 2016-08-10 DIAGNOSIS — E785 Hyperlipidemia, unspecified: Secondary | ICD-10-CM | POA: Diagnosis not present

## 2016-08-10 DIAGNOSIS — Z0000 Encounter for general adult medical examination without abnormal findings: Secondary | ICD-10-CM

## 2016-08-10 DIAGNOSIS — I10 Essential (primary) hypertension: Secondary | ICD-10-CM

## 2016-08-10 DIAGNOSIS — K2100 Gastro-esophageal reflux disease with esophagitis, without bleeding: Secondary | ICD-10-CM

## 2016-08-10 DIAGNOSIS — E119 Type 2 diabetes mellitus without complications: Secondary | ICD-10-CM

## 2016-08-10 DIAGNOSIS — E876 Hypokalemia: Secondary | ICD-10-CM | POA: Diagnosis not present

## 2016-08-10 DIAGNOSIS — G8929 Other chronic pain: Secondary | ICD-10-CM | POA: Diagnosis not present

## 2016-08-10 DIAGNOSIS — B0229 Other postherpetic nervous system involvement: Secondary | ICD-10-CM

## 2016-08-10 LAB — BASIC METABOLIC PANEL
BUN: 26 mg/dL — ABNORMAL HIGH (ref 6–23)
CO2: 29 meq/L (ref 19–32)
CREATININE: 1.43 mg/dL — AB (ref 0.40–1.20)
Calcium: 9.5 mg/dL (ref 8.4–10.5)
Chloride: 110 mEq/L (ref 96–112)
GFR: 45.23 mL/min — ABNORMAL LOW (ref >60.00)
Glucose, Bld: 87 mg/dL (ref 70–99)
POTASSIUM: 3.4 meq/L — AB (ref 3.5–5.1)
SODIUM: 143 meq/L (ref 135–145)

## 2016-08-10 LAB — LIPID PANEL
CHOL/HDL RATIO: 4
Cholesterol: 171 mg/dL (ref 0–200)
HDL: 47.3 mg/dL (ref >39.00)
LDL CALC: 91 mg/dL (ref 0–99)
NONHDL: 123.52
Triglycerides: 163 mg/dL — ABNORMAL HIGH (ref 0.0–149.0)
VLDL: 32.6 mg/dL (ref 0.0–40.0)

## 2016-08-10 LAB — HEPATIC FUNCTION PANEL
ALK PHOS: 65 U/L (ref 39–117)
ALT: 10 U/L (ref 0–35)
AST: 11 U/L (ref 0–37)
Albumin: 3.8 g/dL (ref 3.5–5.2)
BILIRUBIN DIRECT: 0.1 mg/dL (ref 0.0–0.3)
BILIRUBIN TOTAL: 0.5 mg/dL (ref 0.2–1.2)
Total Protein: 7.1 g/dL (ref 6.0–8.3)

## 2016-08-10 LAB — HEMOGLOBIN A1C: Hgb A1c MFr Bld: 6 % (ref 4.6–6.5)

## 2016-08-10 MED ORDER — COLESTIPOL HCL 1 G PO TABS
1.0000 g | ORAL_TABLET | Freq: Two times a day (BID) | ORAL | 3 refills | Status: DC
Start: 2016-08-10 — End: 2018-05-13

## 2016-08-10 MED ORDER — OXYCODONE-ACETAMINOPHEN 5-325 MG PO TABS: 1.0000 | ORAL_TABLET | Freq: Four times a day (QID) | ORAL | 0 refills | Status: DC | PRN

## 2016-08-10 MED ORDER — METOPROLOL SUCCINATE ER 100 MG PO TB24: 100.0000 mg | ORAL_TABLET | Freq: Every day | ORAL | 3 refills | Status: DC

## 2016-08-10 MED ORDER — SIMVASTATIN 40 MG PO TABS: 40.0000 mg | ORAL_TABLET | Freq: Every day | ORAL | 3 refills | Status: DC

## 2016-08-10 MED ORDER — TRAMADOL HCL 50 MG PO TABS: 50.0000 mg | ORAL_TABLET | Freq: Three times a day (TID) | ORAL | 5 refills | Status: DC | PRN

## 2016-08-10 MED ORDER — PANTOPRAZOLE SODIUM 40 MG PO TBEC: 40.0000 mg | DELAYED_RELEASE_TABLET | Freq: Every day | ORAL | 3 refills | Status: DC

## 2016-08-10 MED ORDER — GABAPENTIN 100 MG PO CAPS: 200.0000 mg | ORAL_CAPSULE | Freq: Three times a day (TID) | ORAL | 5 refills | Status: DC

## 2016-08-10 MED ORDER — POTASSIUM CHLORIDE ER 10 MEQ PO TBCR: 10.0000 meq | EXTENDED_RELEASE_TABLET | Freq: Two times a day (BID) | ORAL | 3 refills | Status: DC

## 2016-08-10 MED ORDER — METFORMIN HCL 500 MG PO TABS: 500.0000 mg | ORAL_TABLET | Freq: Two times a day (BID) | ORAL | 3 refills | Status: DC

## 2016-08-10 MED ORDER — DILTIAZEM HCL 120 MG PO TABS
120.0000 mg | ORAL_TABLET | Freq: Every day | ORAL | 3 refills | Status: DC
Start: 2016-08-10 — End: 2017-07-10

## 2016-08-10 MED ORDER — DIAZEPAM 5 MG PO TABS: ORAL_TABLET | ORAL | 5 refills | Status: DC

## 2016-08-10 NOTE — Assessment & Plan Note (Signed)
Mild uncontrolled, for increase the toprol XL to 100 qd

## 2016-08-10 NOTE — Assessment & Plan Note (Signed)
Goal < 70, for contd diet and f/u lab today,  to f/u any worsening symptoms or concerns

## 2016-08-10 NOTE — Patient Instructions (Addendum)
Ok to increase the toprol XL to 100 mg per day  Please continue all other medications as before, and refills have been done if requeste - the valium and the pain medication  Please have the pharmacy call with any other refills you may need.  Please continue your efforts at being more active, low cholesterol diet, and weight control.  You are otherwise up to date with prevention measures today.  Please keep your appointments with your specialists as you may have planned  Please go to the LAB in the Basement (turn left off the elevator) for the tests to be done today  You will be contacted by phone if any changes need to be made immediately.  Otherwise, you will receive a letter about your results with an explanation, but please check with MyChart first.  Please remember to sign up for MyChart if you have not done so, as this will be important to you in the future with finding out test results, communicating by private email, and scheduling acute appointments online when needed.  If you have Medicare related insurance (such as traditional Medicare, [employer] or [employer], or similar), Please make an appointment at the [facility] with Sharee Pimple, the [employer], for your Wellness Visit in this office, which is a benefit with your insurance.  Please return in 6 months, or sooner if needed, with Lab testing done 3-5 days before

## 2016-08-10 NOTE — Assessment & Plan Note (Signed)
Also had tramadol (to not take same day as percocet) for PHN prn

## 2016-08-10 NOTE — Assessment & Plan Note (Signed)
Stable, for pain med refill,  to f/u any worsening symptoms or concerns  

## 2016-08-10 NOTE — Progress Notes (Signed)
Subjective:    Patient ID: Kimberly Castillo, female    DOB: 12-13-1933, 83 y.o.   MRN: [id number]  HPI  Here to f/u; overall doing ok,  Pt denies chest pain, increasing sob or doe, wheezing, orthopnea, PND, increased LE swelling, palpitations, dizziness or syncope.  Pt denies new neurological symptoms such as new headache, or facial or extremity weakness or numbness.  Pt denies polydipsia, polyuria, or low sugar episode.  Pt states overall good compliance with meds, mostly trying to follow appropriate diet, with wt overall stable,  but little exercise however    Dr Florene Glen Neysa Hotter has rec'd increase toprl XL to 100 per pt. Denies worsening depressive symptoms, suicidal ideation, or panic; has ongoing anxiety, asks for valium refill.  Very occasional percocet use still needed for recurring LBP.  Has migraine pain as well.   Past Medical History:  Diagnosis Date  . A-fib ([place])   . Anxiety state 12/22/2013  . Arthritis    hands, back  . Arthritis, lumbar spine ([place]) 12/19/2012  . Cataracts, bilateral   . Chronic lower back pain 12/19/2012  . Diabetes mellitus ([place])   . Diverticulitis   . Dysrhythmia   . GERD (gastroesophageal reflux disease)   . Glaucoma   . Headache    hx migraines  . Heart murmur   . History of shingles   . Hyperlipidemia   . Hypertension   . Numbness    rt side of rt leg  . Post herpetic neuralgia 12/19/2012   Past Surgical History:  Procedure Laterality Date  . ABDOMINAL HYSTERECTOMY  1964   partial  . COLON RESECTION N/A 09/02/2013   Procedure: [place] colectomy;  Surgeon: Excell Seltzer, MD;  Location: [facility];  Service: General;  Laterality: N/A;  . COLONOSCOPY    . COLOSTOMY TAKEDOWN N/A 11/09/2014   Procedure: TAKEDOWN [place] COLOSTOMY;  Surgeon: Excell Seltzer, MD;  Location: [facility];  Service: General;  Laterality: N/A;  . [place]   left  . POLYPECTOMY      reports that she quit smoking about 18 years ago. Her smoking  use included Cigarettes. She started smoking about 24 years ago. She has never used smokeless tobacco. She reports that she does not drink alcohol or use drugs. family history includes Hypertension in her maternal grandmother and mother. No Known Allergies Current Outpatient Prescriptions on File Prior to Visit  Medication Sig Dispense Refill  . aspirin EC 81 MG tablet Take 81 mg by mouth daily.    . Blood Glucose Monitoring Suppl (ONE TOUCH ULTRA 2) w/Device KIT USE as directed 1 each 0  . Cholecalciferol (VITAMIN D3) 5000 UNITS CAPS Take 1 capsule by mouth daily.    . colestipol (COLESTID) 1 g tablet Take 1 tablet (1 g total) by mouth 2 (two) times daily. 60 tablet 3  . diltiazem (CARDIZEM) 120 MG tablet TAKE ONE TABLET BY MOUTH ONCE DAILY 90 tablet 1  . gabapentin (NEURONTIN) 100 MG capsule Take 2 capsules (200 mg total) by mouth 3 (three) times daily. 180 capsule 5  . glucose blood (ONE TOUCH ULTRA TEST) test strip Use as instructed 100 each 12  . hyoscyamine (LEVSIN SL) 0.125 MG SL tablet Place 1-2 tablets (0.125-0.25 mg total) under the tongue every 4 (four) hours as needed. 60 tablet 1  . Lancets MISC Use as directed once daily 100 each 11  . latanoprost (XALATAN) 0.005 % ophthalmic solution Place 1 drop into both eyes at  bedtime.    . metFORMIN (GLUCOPHAGE) 500 MG tablet Take 1 tablet (500 mg total) by mouth 2 (two) times daily with a meal. 180 tablet 3  . pantoprazole (PROTONIX) 40 MG tablet Take 1 tablet (40 mg total) by mouth daily. 90 tablet 3  . potassium chloride (K-DUR) 10 MEQ tablet Take 1 tablet (10 mEq total) by mouth 2 (two) times daily. 2 tabs by mouth per day 60 tablet 0  . simvastatin (ZOCOR) 40 MG tablet Take 1 tablet (40 mg total) by mouth at bedtime. DC any old scripts for same medication 90 tablet 4  . sulindac (CLINORIL) 200 MG tablet TAKE ONE TABLET BY MOUTH TWICE DAILY AS NEEDED 180 tablet 1  . timolol (BETIMOL) 0.5 % ophthalmic solution Place 1 drop into both eyes  daily.    . traMADol (ULTRAM) 50 MG tablet TAKE ONE TABLET BY MOUTH EVERY 8 HOURS AS NEEDED 60 tablet 5   No current facility-administered medications on file prior to visit.     Review of Systems  Constitutional: Negative for other unusual diaphoresis or sweats HENT: Negative for ear discharge or swelling Eyes: Negative for other worsening visual disturbances Respiratory: Negative for stridor or other swelling  Gastrointestinal: Negative for worsening distension or other blood Genitourinary: Negative for retention or other urinary change Musculoskeletal: Negative for other MSK pain or swelling Skin: Negative for color change or other new lesions Neurological: Negative for worsening tremors and other numbness  Psychiatric/Behavioral: Negative for worsening agitation or other fatigue All other system neg per pt    Objective:   Physical Exam BP (!) 140/92   Pulse 89   Ht 5' 2"  (1.575 m)   Wt 154 lb (69.9 kg)   SpO2 97%   BMI 28.17 kg/m  VS noted, not ill appearing Constitutional: Pt appears in NAD HENT: Head: NCAT.  Right Ear: External ear normal.  Left Ear: External ear normal.  Eyes: . Pupils are equal, round, and reactive to light. Conjunctivae and EOM are normal Nose: without d/c or deformity Neck: Neck supple. Gross normal ROM Cardiovascular: Normal rate and regular rhythm.   Pulmonary/Chest: Effort normal and breath sounds without rales or wheezing.  Abd:  Soft, NT, ND, + BS, no organomegaly Spine nontender in the midline Neurological: Pt is alert. At baseline orientation, motor grossly intact Skin: Skin is warm. No rashes, other new lesions, no LE edema Psychiatric: Pt behavior is normal without agitation , mild nervous No other exam findings  Lab Results  Component Value Date   WBC 6.5 02/02/2016   HGB 11.7 (L) 02/02/2016   HCT 35.0 (L) 02/02/2016   PLT 209.0 02/02/2016   GLUCOSE 115 (H) 02/16/2016   CHOL 239 (H) 02/02/2016   TRIG 376.0 (H) 02/02/2016   HDL  46.10 02/02/2016   LDLDIRECT 129.0 02/02/2016   LDLCALC 113 (H) 01/26/2015   ALT 11 02/02/2016   AST 14 02/02/2016   NA 145 02/16/2016   K 3.0 (L) 02/16/2016   CL 106 02/16/2016   CREATININE 1.68 (H) 02/16/2016   BUN 21 02/16/2016   CO2 32 02/16/2016   TSH 0.95 02/02/2016   HGBA1C 6.3 02/02/2016   MICROALBUR 81.4 (H) 02/02/2016      Assessment & Plan:

## 2016-08-10 NOTE — Assessment & Plan Note (Signed)
stable overall by history and exam, recent data reviewed with pt, and pt to continue medical treatment as before,  to f/u any worsening symptoms or concerns, for f/u lab today 

## 2016-08-14 ENCOUNTER — Other Ambulatory Visit: Payer: Self-pay | Admitting: Internal Medicine

## 2016-08-14 ENCOUNTER — Telehealth: Payer: Self-pay

## 2016-08-14 ENCOUNTER — Encounter: Payer: Self-pay | Admitting: Internal Medicine

## 2016-08-14 DIAGNOSIS — E876 Hypokalemia: Secondary | ICD-10-CM

## 2016-08-14 MED ORDER — POTASSIUM CHLORIDE ER 10 MEQ PO TBCR: EXTENDED_RELEASE_TABLET | ORAL | 3 refills | Status: DC

## 2016-08-14 NOTE — Telephone Encounter (Signed)
Pt has been informed and expressed understanding.  

## 2016-08-14 NOTE — Telephone Encounter (Signed)
-----   Message from Biagio Borg, MD sent at 08/14/2016  1:15 PM EDT ----- .Left message on MyChart, pt to cont same tx except  The test results show that your current treatment is OK, except the potassium is still mildly low.  Please increase the potassium to 3 pills per day.  A new prescription will be sent, and you should be called from the office as well.Redmond Baseman to please inform pt, I will do rx

## 2016-09-04 ENCOUNTER — Encounter (INDEPENDENT_AMBULATORY_CARE_PROVIDER_SITE_OTHER): Payer: Self-pay | Admitting: Orthopaedic Surgery

## 2016-09-04 ENCOUNTER — Ambulatory Visit (INDEPENDENT_AMBULATORY_CARE_PROVIDER_SITE_OTHER): Payer: Medicare Other | Admitting: Orthopaedic Surgery

## 2016-09-04 ENCOUNTER — Ambulatory Visit (INDEPENDENT_AMBULATORY_CARE_PROVIDER_SITE_OTHER): Payer: Self-pay

## 2016-09-04 VITALS — Ht 62.0 in | Wt 154.0 lb

## 2016-09-04 DIAGNOSIS — M4316 Spondylolisthesis, lumbar region: Secondary | ICD-10-CM | POA: Diagnosis not present

## 2016-09-04 MED ORDER — DEXAMETHASONE 4 MG PO TABS: 4.0000 mg | ORAL_TABLET | Freq: Every day | ORAL | 0 refills | Status: DC

## 2016-09-04 NOTE — Progress Notes (Signed)
Office Visit Note   Patient: Kimberly Castillo           Date of Birth: [birth year]           MRN: [id number] Visit Date: 09/04/2016              Requested by: Biagio Borg, MD [place] [place], [place] [id number] PCP: Biagio Borg, MD   Assessment & Plan: Visit Diagnoses:  1. Spondylolisthesis, lumbar region     Plan: Patient is stable degenerative L4-5 spondylolisthesis with bilateral severe foraminal stenosis. She is active stable and I'll recheck her in 6 months. If her ambulation decreases her she increases leg pain or weakness she can return sooner. Dexamethasone 4 mg prescription given she can take a half tablet once or twice a month if she has a severe flare and we discussed the problems taking it more frequently with decreased bone density and making her diabetes worse.  Follow-Up Instructions: Return in about 6 months (around 03/04/2017).   Orders:  Orders Placed This Encounter  Procedures  . XR Lumb Spine Flex&Ext Only   No orders of the defined types were placed in this encounter.     Procedures: No procedures performed   Clinical Data: No additional findings.   Subjective: Chief Complaint  Patient presents with  . Lower Back - Pain, Follow-up    HPI 83 year old female seen with degenerative spondylolisthesis at L4-5 grade 1-1/2. She occasionally takes some Percocet possibly 2 or 3 times a month. She requested some more dexamethasone which she occasionally takes once or twice a month usually only a half tablet for a day or 2 and then quits. She's been ambulatory she denies bowel or bladder symptoms. She's had some discomfort at the base of her right thumb the response to topical anti-inflammatory. She also occasionally had some pain in her great toe negative history of gout. Positive history type 2 diabetes.  Review of Systems systems updated noticed the history of type 2 diabetes, hypertension, hyperlipidemia, GERD, history of anxiety, L4-5  spondylolisthesis, stage III kidney disease, atrial fib   Objective: Vital Signs: Ht 5\' 2"  (1.575 m)   Wt 154 lb (69.9 kg)   BMI 28.17 kg/m   Physical Exam  Constitutional: She is oriented to person, place, and time. She appears well-developed.  HENT:  Head: Normocephalic.  Right Ear: External ear normal.  Left Ear: External ear normal.  Eyes: Pupils are equal, round, and reactive to light.  Neck: No tracheal deviation present. No thyromegaly present.  Cardiovascular: Intact distal pulses.   Pulmonary/Chest: Effort normal.  Abdominal: Soft.  Neurological: She is alert and oriented to person, place, and time.  Skin: Skin is warm and dry.  Psychiatric: She has a normal mood and affect. Her behavior is normal.    Ortho Exam she gets from sitting standing comfortably. Mild discomfort straight leg raising 90. Normal heel-to-toe gait.  Specialty Comments:  No specialty comments available.  Imaging: Xr Lumb Spine Flex&ext Only  Result Date: 09/04/2016 Lateral flexion extension lumbar x-rays are obtained. This shows grade 1.5 anterolisthesis at L4-5 with minimal change on flexion-extension. There is facet arthropathy. Negative for acute fracture. Impression: L4-5 degenerative anterolisthesis without significant shifting on flexion-extension views    PMFS History: Patient Active Problem List   Diagnosis Date Noted  . Spondylolisthesis, lumbar region 03/02/2016  . CKD (chronic kidney disease), stage III 02/16/2016  . Chronic low back pain 07/12/2015  . Bilateral foot pain 01/26/2015  .  Colostomy status ([place]) 11/09/2014  . Rash 06/24/2014  . Preventative health care 12/31/2013  . Anxiety state 12/22/2013  . Yeast vaginitis 12/22/2013  . Diverticulitis of colon with perforation s/p colectomy/ostomy 09/02/2013 09/02/2013  . Post herpetic neuralgia 12/19/2012  . Arthritis, lumbar spine ([place]) 12/19/2012  . Glaucoma   . A-fib ([place])   . GERD (gastroesophageal reflux disease)  10/06/2012  . DM type 2 (diabetes mellitus, type 2) ([place]) 09/22/2012  . Hypertension 09/22/2012  . Hyperlipemia 09/22/2012   Past Medical History:  Diagnosis Date  . A-fib ([place])   . Anxiety state 12/22/2013  . Arthritis    hands, back  . Arthritis, lumbar spine ([place]) 12/19/2012  . Cataracts, bilateral   . Chronic lower back pain 12/19/2012  . Diabetes mellitus ([place])   . Diverticulitis   . Dysrhythmia   . GERD (gastroesophageal reflux disease)   . Glaucoma   . Headache    hx migraines  . Heart murmur   . History of shingles   . Hyperlipidemia   . Hypertension   . Numbness    rt side of rt leg  . Post herpetic neuralgia 12/19/2012    Family History  Problem Relation Age of Onset  . Hypertension Mother   . Hypertension Maternal Grandmother   . Colon cancer Neg Hx   . Stomach cancer Neg Hx   . Colon polyps Neg Hx   . Esophageal cancer Neg Hx   . Rectal cancer Neg Hx     Past Surgical History:  Procedure Laterality Date  . ABDOMINAL HYSTERECTOMY  1964   partial  . COLON RESECTION N/A 09/02/2013   Procedure: [place] colectomy;  Surgeon: Excell Seltzer, MD;  Location: [facility];  Service: General;  Laterality: N/A;  . COLONOSCOPY    . COLOSTOMY TAKEDOWN N/A 11/09/2014   Procedure: TAKEDOWN [place] COLOSTOMY;  Surgeon: Excell Seltzer, MD;  Location: [facility];  Service: General;  Laterality: N/A;  . [place]   left  . POLYPECTOMY     Social History   Occupational History  . Not on file.   Social History Main Topics  . Smoking status: Former Smoker    Types: Cigarettes    Start date: 09/17/1991    Quit date: 10/31/1997  . Smokeless tobacco: Never Used  . Alcohol use No  . Drug use: No  . Sexual activity: Yes

## 2017-01-22 ENCOUNTER — Encounter: Payer: Self-pay | Admitting: General Surgery

## 2017-02-12 ENCOUNTER — Telehealth: Payer: Self-pay | Admitting: Internal Medicine

## 2017-02-12 ENCOUNTER — Ambulatory Visit (INDEPENDENT_AMBULATORY_CARE_PROVIDER_SITE_OTHER): Payer: Medicare HMO | Admitting: Internal Medicine

## 2017-02-12 ENCOUNTER — Other Ambulatory Visit: Payer: Self-pay | Admitting: Internal Medicine

## 2017-02-12 ENCOUNTER — Other Ambulatory Visit (INDEPENDENT_AMBULATORY_CARE_PROVIDER_SITE_OTHER): Payer: Medicare HMO

## 2017-02-12 ENCOUNTER — Encounter: Payer: Self-pay | Admitting: Internal Medicine

## 2017-02-12 VITALS — BP 132/84 | HR 91 | Temp 98.4°F | Ht 62.0 in | Wt 153.0 lb

## 2017-02-12 DIAGNOSIS — E119 Type 2 diabetes mellitus without complications: Secondary | ICD-10-CM

## 2017-02-12 DIAGNOSIS — Z0000 Encounter for general adult medical examination without abnormal findings: Secondary | ICD-10-CM | POA: Diagnosis not present

## 2017-02-12 DIAGNOSIS — M25561 Pain in right knee: Secondary | ICD-10-CM | POA: Insufficient documentation

## 2017-02-12 DIAGNOSIS — E876 Hypokalemia: Secondary | ICD-10-CM

## 2017-02-12 LAB — BASIC METABOLIC PANEL
BUN: 27 mg/dL — ABNORMAL HIGH (ref 6–23)
CO2: 28 meq/L (ref 19–32)
Calcium: 9.6 mg/dL (ref 8.4–10.5)
Chloride: 108 mEq/L (ref 96–112)
Creatinine, Ser: 1.63 mg/dL — ABNORMAL HIGH (ref 0.40–1.20)
GFR: 38.84 mL/min — ABNORMAL LOW (ref >60.00)
GLUCOSE: 97 mg/dL (ref 70–99)
POTASSIUM: 3.1 meq/L — AB (ref 3.5–5.1)
SODIUM: 144 meq/L (ref 135–145)

## 2017-02-12 LAB — URINALYSIS, ROUTINE W REFLEX MICROSCOPIC
BILIRUBIN URINE: NEGATIVE
Ketones, ur: NEGATIVE
Leukocytes, UA: NEGATIVE
Nitrite: NEGATIVE
Total Protein, Urine: >=300 — AB
Urine Glucose: NEGATIVE
Urobilinogen, UA: 0.2 (ref 0.0–1.0)
pH: 6 (ref 5.0–8.0)

## 2017-02-12 LAB — CBC WITH DIFFERENTIAL/PLATELET
BASOS PCT: 1.2 % (ref 0.0–3.0)
Basophils Absolute: 0.1 10*3/uL (ref 0.0–0.1)
EOS PCT: 2.6 % (ref 0.0–5.0)
Eosinophils Absolute: 0.1 10*3/uL (ref 0.0–0.7)
HCT: 37.9 % (ref 36.0–46.0)
Hemoglobin: 12.7 g/dL (ref 12.0–15.0)
LYMPHS ABS: 2.2 10*3/uL (ref 0.7–4.0)
Lymphocytes Relative: 42.5 % (ref 12.0–46.0)
MCHC: 33.4 g/dL (ref 30.0–36.0)
MCV: 96.6 fl (ref 78.0–100.0)
MONOS PCT: 10.8 % (ref 3.0–12.0)
Monocytes Absolute: 0.6 10*3/uL (ref 0.1–1.0)
NEUTROS PCT: 42.9 % — AB (ref 43.0–77.0)
Neutro Abs: 2.3 10*3/uL (ref 1.4–7.7)
Platelets: 239 10*3/uL (ref 150.0–400.0)
RBC: 3.92 Mil/uL (ref 3.87–5.11)
RDW: 14.2 % (ref 11.5–15.5)
WBC: 5.3 10*3/uL (ref 4.0–10.5)

## 2017-02-12 LAB — TSH: TSH: 1.97 u[IU]/mL (ref 0.35–4.50)

## 2017-02-12 LAB — LIPID PANEL
CHOL/HDL RATIO: 4
Cholesterol: 202 mg/dL — ABNORMAL HIGH (ref 0–200)
HDL: 49.5 mg/dL (ref >39.00)
LDL CALC: 122 mg/dL — AB (ref 0–99)
NONHDL: 152.01
Triglycerides: 152 mg/dL — ABNORMAL HIGH (ref 0.0–149.0)
VLDL: 30.4 mg/dL (ref 0.0–40.0)

## 2017-02-12 LAB — HEPATIC FUNCTION PANEL
ALBUMIN: 3.8 g/dL (ref 3.5–5.2)
ALK PHOS: 75 U/L (ref 39–117)
ALT: 13 U/L (ref 0–35)
AST: 14 U/L (ref 0–37)
BILIRUBIN DIRECT: 0.1 mg/dL (ref 0.0–0.3)
BILIRUBIN TOTAL: 0.6 mg/dL (ref 0.2–1.2)
Total Protein: 7.2 g/dL (ref 6.0–8.3)

## 2017-02-12 LAB — MICROALBUMIN / CREATININE URINE RATIO
CREATININE, U: 201.5 mg/dL
MICROALB/CREAT RATIO: 111.1 mg/g — AB (ref 0.0–30.0)
Microalb, Ur: 224 mg/dL — ABNORMAL HIGH (ref 0.0–1.9)

## 2017-02-12 LAB — HEMOGLOBIN A1C: Hgb A1c MFr Bld: 6.1 % (ref 4.6–6.5)

## 2017-02-12 MED ORDER — OXYCODONE-ACETAMINOPHEN 5-325 MG PO TABS: 1.0000 | ORAL_TABLET | Freq: Four times a day (QID) | ORAL | 0 refills | Status: DC | PRN

## 2017-02-12 MED ORDER — POTASSIUM CHLORIDE ER 10 MEQ PO TBCR: EXTENDED_RELEASE_TABLET | ORAL | 3 refills | Status: DC

## 2017-02-12 NOTE — Assessment & Plan Note (Signed)

## 2017-02-12 NOTE — Patient Instructions (Signed)
Please take all new medication as prescribed - the percocet for pain  Please be sure to follow up with Dr Lorin Mercy as you have planned for the right knee  Please continue all other medications as before, and refills have been done if requested.  Please have the pharmacy call with any other refills you may need.  Please continue your efforts at being more active, low cholesterol diet, and weight control.  You are otherwise up to date with prevention measures today.  Please keep your appointments with your specialists as you may have planned  Please go to the LAB in the Basement (turn left off the elevator) for the tests to be done today  You will be contacted by phone if any changes need to be made immediately.  Otherwise, you will receive a letter about your results with an explanation, but please check with MyChart first.  Please remember to sign up for MyChart if you have not done so, as this will be important to you in the future with finding out test results, communicating by private email, and scheduling acute appointments online when needed.  Please return in 6 months, or sooner if needed, with Lab testing done 3-5 days before

## 2017-02-12 NOTE — Telephone Encounter (Signed)
Copied from [place] [id number]. Topic: Quick Communication - Rx Refill/Question >> Feb 12, 2017 12:00 PM Arletha Grippe wrote: Medication: oxyCODONE-acetaminophen (PERCOCET/ROXICET) 5-325 MG tablet    Has the patient contacted their pharmacy? Yes.     (Agent: If no, request that the patient contact the pharmacy for the refill.)   Preferred Pharmacy (with phone number or street name): pharm called - they need to know the diagnosis for rx - and also can the fill the rx for 5 days instead of 7 per [facility].  Unless she has had surgery, then they can do 7 days Cb# [phone]   Agent: Please be advised that RX refills may take up to 3 business days. We ask that you follow-up with your pharmacy.

## 2017-02-12 NOTE — Telephone Encounter (Signed)
Call to pharmacy- diagnosis code given for visit- verified non surgical visit.

## 2017-02-12 NOTE — Assessment & Plan Note (Signed)
stable overall by history and exam, recent data reviewed with pt, and pt to continue medical treatment as before,  to f/u any worsening symptoms or concerns  

## 2017-02-12 NOTE — Assessment & Plan Note (Signed)
For percocet prn, rare use prn

## 2017-02-12 NOTE — Progress Notes (Signed)
Subjective:    Patient ID: Kimberly Castillo, female    DOB: [birth year], 84 y.o.   MRN: [id number]  HPI  Here for wellness and f/u;  Overall doing ok;  Pt denies Chest pain, worsening SOB, DOE, wheezing, orthopnea, PND, worsening LE edema, palpitations, dizziness or syncope.  Pt denies neurological change such as new headache, facial or extremity weakness.  Pt denies polydipsia, polyuria, or low sugar symptoms. Pt states overall good compliance with treatment and medications, good tolerability, and has been trying to follow appropriate diet.  Pt denies worsening depressive symptoms, suicidal ideation or panic. No fever, night sweats, wt loss, loss of appetite, or other constitutional symptoms.  Pt states good ability with ADL's, has low fall risk, home safety reviewed and adequate, no other significant changes in hearing or vision, and not active with exercise.  Declines flu shot, was sick with last one a few yrs ago.  Also has upcoming appt with Dr Lorin Mercy for right knee, did have a giveaway just last night and went down to floor on the right knee, but caught the wall and no injury.  Pain now about 120/10, hoping for tx today prior to ortho appt..  Also with feet pain but no swelling.  Also has appt with optho for next mo.  No other interval hx or complaints Past Medical History:  Diagnosis Date  . A-fib ([place])   . Anxiety state 12/22/2013  . Arthritis    hands, back  . Arthritis, lumbar spine 12/19/2012  . Cataracts, bilateral   . Chronic lower back pain 12/19/2012  . Diabetes mellitus ([place])   . Diverticulitis   . Dysrhythmia   . GERD (gastroesophageal reflux disease)   . Glaucoma   . Headache    hx migraines  . Heart murmur   . History of shingles   . Hyperlipidemia   . Hypertension   . Numbness    rt side of rt leg  . Post herpetic neuralgia 12/19/2012   Past Surgical History:  Procedure Laterality Date  . ABDOMINAL HYSTERECTOMY  1964   partial  . COLON RESECTION N/A 09/02/2013   Procedure: [facility] [place] colectomy;  Surgeon: Excell Seltzer, MD;  Location: [facility];  Service: General;  Laterality: N/A;  . COLONOSCOPY    . COLOSTOMY TAKEDOWN N/A 11/09/2014   Procedure: TAKEDOWN [facility] [place] COLOSTOMY;  Surgeon: Excell Seltzer, MD;  Location: [facility];  Service: General;  Laterality: N/A;  . [place]   left  . POLYPECTOMY      reports that she quit smoking about 19 years ago. Her smoking use included cigarettes. She started smoking about 25 years ago. she has never used smokeless tobacco. She reports that she does not drink alcohol or use drugs. family history includes Hypertension in her maternal grandmother and mother. No Known Allergies Current Outpatient Medications on File Prior to Visit  Medication Sig Dispense Refill  . aspirin EC 81 MG tablet Take 81 mg by mouth daily.    . Blood Glucose Monitoring Suppl (ONE TOUCH ULTRA 2) w/Device KIT USE as directed 1 each 0  . Cholecalciferol (VITAMIN D3) 5000 UNITS CAPS Take 1 capsule by mouth daily.    . colestipol (COLESTID) 1 g tablet Take 1 tablet (1 g total) by mouth 2 (two) times daily. 180 tablet 3  . dexamethasone (DECADRON) 4 MG tablet Take 1 tablet (4 mg total) by mouth daily. 30 tablet 0  . diazepam (VALIUM) 5 MG tablet TAKE ONE-HALF TO  ONE TABLET BY MOUTH ONCE DAILY AS NEEDED 30 tablet 5  . diltiazem (CARDIZEM) 120 MG tablet Take 1 tablet (120 mg total) by mouth daily. 90 tablet 3  . gabapentin (NEURONTIN) 100 MG capsule Take 2 capsules (200 mg total) by mouth 3 (three) times daily. 180 capsule 5  . glucose blood (ONE TOUCH ULTRA TEST) test strip Use as instructed 100 each 12  . hyoscyamine (LEVSIN SL) 0.125 MG SL tablet Place 1-2 tablets (0.125-0.25 mg total) under the tongue every 4 (four) hours as needed. 60 tablet 1  . Lancets MISC Use as directed once daily 100 each 11  . latanoprost (XALATAN) 0.005 % ophthalmic solution Place 1 drop into both eyes at bedtime.    . metFORMIN  (GLUCOPHAGE) 500 MG tablet Take 1 tablet (500 mg total) by mouth 2 (two) times daily with a meal. 180 tablet 3  . metoprolol succinate (TOPROL-XL) 100 MG 24 hr tablet Take 1 tablet (100 mg total) by mouth daily. Take with or immediately following a meal. 90 tablet 3  . pantoprazole (PROTONIX) 40 MG tablet Take 1 tablet (40 mg total) by mouth daily. 90 tablet 3  . simvastatin (ZOCOR) 40 MG tablet Take 1 tablet (40 mg total) by mouth at bedtime. DC any old scripts for same medication 90 tablet 3  . sulindac (CLINORIL) 200 MG tablet TAKE ONE TABLET BY MOUTH TWICE DAILY AS NEEDED 180 tablet 1  . timolol (BETIMOL) 0.5 % ophthalmic solution Place 1 drop into both eyes daily.    . traMADol (ULTRAM) 50 MG tablet Take 1 tablet (50 mg total) by mouth every 8 (eight) hours as needed. 60 tablet 5   No current facility-administered medications on file prior to visit.    Review of Systems Constitutional: Negative for other unusual diaphoresis, sweats, appetite or weight changes HENT: Negative for other worsening hearing loss, ear pain, facial swelling, mouth sores or neck stiffness.   Eyes: Negative for other worsening pain, redness or other visual disturbance.  Respiratory: Negative for other stridor or swelling Cardiovascular: Negative for other palpitations or other chest pain  Gastrointestinal: Negative for worsening diarrhea or loose stools, blood in stool, distention or other pain Genitourinary: Negative for hematuria, flank pain or other change in urine volume.  Musculoskeletal: Negative for myalgias or other joint swelling.  Skin: Negative for other color change, or other wound or worsening drainage.  Neurological: Negative for other syncope or numbness. Hematological: Negative for other adenopathy or swelling Psychiatric/Behavioral: Negative for hallucinations, other worsening agitation, SI, self-injury, or new decreased concentration All other system neg per pt    Objective:   Physical Exam BP  132/84   Pulse 91   Temp 98.4 F (36.9 C) (Oral)   Ht _0  (1.575 m)   Wt 153 lb (69.4 kg)   SpO2 98%   BMI 27.98 kg/m  VS noted,  Constitutional: Pt is oriented to person, place, and time. Appears well-developed and well-nourished, in no significant distress and comfortable Head: Normocephalic and atraumatic  Eyes: Conjunctivae and EOM are normal. Pupils are equal, round, and reactive to light Right Ear: External ear normal without discharge Left Ear: External ear normal without discharge Nose: Nose without discharge or deformity Mouth/Throat: Oropharynx is without other ulcerations and moist  Neck: Normal range of motion. Neck supple. No JVD present. No tracheal deviation present or significant neck LA or mass Cardiovascular: Normal rate, regular rhythm, normal heart sounds and intact distal pulses.   Pulmonary/Chest: WOB normal and breath  sounds without rales or wheezing  Abdominal: Soft. Bowel sounds are normal. NT. No HSM  Musculoskeletal: Normal range of motion. Exhibits no edema, right medial knee bony degenerative changes Lymphadenopathy: Has no other cervical adenopathy.  Neurological: Pt is alert and oriented to person, place, and time. Pt has normal reflexes. No cranial nerve deficit. Motor grossly intact, Gait intact Skin: Skin is warm and dry. No rash noted or new ulcerations Psychiatric:  Has normal mood and affect. Behavior is normal without agitation No other exam findings Lab Results  Component Value Date   WBC 5.3 02/12/2017   HGB 12.7 02/12/2017   HCT 37.9 02/12/2017   PLT 239.0 02/12/2017   GLUCOSE 97 02/12/2017   CHOL 202 (H) 02/12/2017   TRIG 152.0 (H) 02/12/2017   HDL 49.50 02/12/2017   LDLDIRECT 129.0 02/02/2016   LDLCALC 122 (H) 02/12/2017   ALT 13 02/12/2017   AST 14 02/12/2017   NA 144 02/12/2017   K 3.1 (L) 02/12/2017   CL 108 02/12/2017   CREATININE 1.63 (H) 02/12/2017   BUN 27 (H) 02/12/2017   CO2 28 02/12/2017   TSH 1.97 02/12/2017    HGBA1C 6.1 02/12/2017   MICROALBUR 224.0 (H) 02/12/2017        Assessment & Plan:

## 2017-02-13 ENCOUNTER — Telehealth: Payer: Self-pay

## 2017-02-13 NOTE — Telephone Encounter (Signed)
Called pt, LVM.   CRM created.  

## 2017-02-13 NOTE — Telephone Encounter (Signed)
-----   Message from Biagio Borg, MD sent at 02/12/2017  1:13 PM EST ----- Left message on MyChart, pt to cont same tx except  The test results show that your current treatment is OK, as the tests are ok except the potassium is still mild low.  We need to increase the potassium by 1 more pill per day.  I will send a new prescription, and you should hear from the office as well.Redmond Baseman to please inform pt, I will do rx

## 2017-03-05 ENCOUNTER — Ambulatory Visit (INDEPENDENT_AMBULATORY_CARE_PROVIDER_SITE_OTHER): Payer: Medicare HMO | Admitting: Orthopaedic Surgery

## 2017-03-05 ENCOUNTER — Encounter (INDEPENDENT_AMBULATORY_CARE_PROVIDER_SITE_OTHER): Payer: Self-pay | Admitting: Orthopaedic Surgery

## 2017-03-05 VITALS — BP 126/76 | HR 44 | Ht 62.0 in | Wt 153.0 lb

## 2017-03-05 DIAGNOSIS — M4316 Spondylolisthesis, lumbar region: Secondary | ICD-10-CM

## 2017-03-05 DIAGNOSIS — M47816 Spondylosis without myelopathy or radiculopathy, lumbar region: Secondary | ICD-10-CM | POA: Diagnosis not present

## 2017-03-05 NOTE — Progress Notes (Signed)
Office Visit Note   Patient: Kimberly Castillo           Date of Birth: 1933-12-08           MRN: [id number] Visit Date: 03/05/2017              Requested by: Biagio Borg, MD [address] [place], [place] [address] PCP: Biagio Borg, MD   Assessment & Plan: Visit Diagnoses:  1. Arthritis, lumbar spine   2. Spondylolisthesis, lumbar region     Plan: Continue walking and rest.  If she gets increased symptoms of claudication or radiculopathy she can return.  No change in her medications.  Follow-Up Instructions: No Follow-up on file.   Orders:  No orders of the defined types were placed in this encounter.  No orders of the defined types were placed in this encounter.     Procedures: No procedures performed   Clinical Data: No additional findings.   Subjective: Chief Complaint  Patient presents with  . Lower Back - Pain, Follow-up    HPI  84 year old female returns with degenerative anterolisthesis at L4-5 with facet arthropathy and some foraminal stenosis.  She states she has pain in her back pain in her legs has problems with a cyst over the second metatarsal head.  She is had pain due to post herpetic neuralgia after shingles in 2014.  She has knee pain problems and states her entire body hurts.  She is on Neurontin also occasionally uses Percocet and tries to limit the amount of Percocet she takes.  Previously she was followed by Dr. Marily Memos.  Patient does have some knee arthritis has some vascular calcification.  She takes the dexamethasone 1/2 tablet 1 or 2 a month and also the oxycodone occasionally.  Patient also uses Tylenol arthritis.  Review of Systems 14 point review of systems updated unchanged from last office visit.   Objective: Vital Signs: BP 126/76   Pulse (!) 44   Ht 5\' 2"  (1.575 m)   Wt 153 lb (69.4 kg)   BMI 27.98 kg/m   Physical Exam  Constitutional: She is oriented to person, place, and time. She appears well-developed.  HENT:    Head: Normocephalic.  Right Ear: External ear normal.  Left Ear: External ear normal.  Eyes: Pupils are equal, round, and reactive to light.  Neck: No tracheal deviation present. No thyromegaly present.  Cardiovascular: Normal rate.  Pulmonary/Chest: Effort normal.  Abdominal: Soft.  Neurological: She is alert and oriented to person, place, and time.  Skin: Skin is warm and dry.  Psychiatric: She has a normal mood and affect. Her behavior is normal.    Ortho Exam patient has some lumbar tenderness negative straight leg raising 90 degrees.  Palpable ganglion adjacent to the second toe flexor tendon right foot.  It is about 1 cm.  Patient states at times he gets bigger and then gets smaller.  Good capillary refill to the toes dorsalis pedis pulses intact.  Anterior tib gastrocsoleus are active and symmetrical.  Specialty Comments:  No specialty comments available.  Imaging: No results found.   PMFS History: Patient Active Problem List   Diagnosis Date Noted  . Right knee pain 02/12/2017  . Spondylolisthesis, lumbar region 03/02/2016  . CKD (chronic kidney disease), stage III ([place]) 02/16/2016  . Chronic low back pain 07/12/2015  . Bilateral foot pain 01/26/2015  . Colostomy status ([place]) 11/09/2014  . Rash 06/24/2014  . Preventative health care 12/31/2013  .  Anxiety state 12/22/2013  . Yeast vaginitis 12/22/2013  . Diverticulitis of colon with perforation s/p colectomy/ostomy 09/02/2013 09/02/2013  . Post herpetic neuralgia 12/19/2012  . Arthritis, lumbar spine 12/19/2012  . Glaucoma   . A-fib ([place])   . GERD (gastroesophageal reflux disease) 10/06/2012  . DM type 2 (diabetes mellitus, type 2) ([place]) 09/22/2012  . Hypertension 09/22/2012  . Hyperlipemia 09/22/2012   Past Medical History:  Diagnosis Date  . A-fib ([place])   . Anxiety state 12/22/2013  . Arthritis    hands, back  . Arthritis, lumbar spine 12/19/2012  . Cataracts, bilateral   . Chronic lower back pain  12/19/2012  . Diabetes mellitus ([place])   . Diverticulitis   . Dysrhythmia   . GERD (gastroesophageal reflux disease)   . Glaucoma   . Headache    hx migraines  . Heart murmur   . History of shingles   . Hyperlipidemia   . Hypertension   . Numbness    rt side of rt leg  . Post herpetic neuralgia 12/19/2012    Family History  Problem Relation Age of Onset  . Hypertension Mother   . Hypertension Maternal Grandmother   . Colon cancer Neg Hx   . Stomach cancer Neg Hx   . Colon polyps Neg Hx   . Esophageal cancer Neg Hx   . Rectal cancer Neg Hx     Past Surgical History:  Procedure Laterality Date  . ABDOMINAL HYSTERECTOMY  1964   partial  . COLON RESECTION N/A 09/02/2013   Procedure: [facility] [place] colectomy;  Surgeon: Excell Seltzer, MD;  [facility];  Service: General;  Laterality: N/A;  . COLONOSCOPY    . COLOSTOMY TAKEDOWN N/A 11/09/2014   Procedure: TAKEDOWN [facility] [place] COLOSTOMY;  Surgeon: Excell Seltzer, MD;  [facility];  Service: General;  Laterality: N/A;  . [place]   left  . POLYPECTOMY     Social History   Occupational History  . Not on file  Tobacco Use  . Smoking status: Former Smoker    Types: Cigarettes    Start date: 09/17/1991    Last attempt to quit: 10/31/1997    Years since quitting: 19.3  . Smokeless tobacco: Never Used  Substance and Sexual Activity  . Alcohol use: No  . Drug use: No  . Sexual activity: Yes

## 2017-04-16 ENCOUNTER — Other Ambulatory Visit: Payer: Self-pay

## 2017-04-16 MED ORDER — SIMVASTATIN 40 MG PO TABS: 40.0000 mg | ORAL_TABLET | Freq: Every day | ORAL | 3 refills | Status: DC

## 2017-04-16 MED ORDER — GABAPENTIN 100 MG PO CAPS
200.0000 mg | ORAL_CAPSULE | Freq: Three times a day (TID) | ORAL | 5 refills | Status: DC
Start: 2017-04-16 — End: 2018-05-13

## 2017-04-16 MED ORDER — METFORMIN HCL 500 MG PO TABS: 500.0000 mg | ORAL_TABLET | Freq: Two times a day (BID) | ORAL | 3 refills | Status: DC

## 2017-07-08 ENCOUNTER — Encounter: Payer: Self-pay | Admitting: Internal Medicine

## 2017-07-08 ENCOUNTER — Ambulatory Visit (INDEPENDENT_AMBULATORY_CARE_PROVIDER_SITE_OTHER): Payer: Medicare HMO | Admitting: Internal Medicine

## 2017-07-08 ENCOUNTER — Other Ambulatory Visit (INDEPENDENT_AMBULATORY_CARE_PROVIDER_SITE_OTHER): Payer: Medicare HMO

## 2017-07-08 VITALS — BP 142/94 | HR 84 | Temp 98.7°F | Ht 62.0 in | Wt 152.0 lb

## 2017-07-08 DIAGNOSIS — I10 Essential (primary) hypertension: Secondary | ICD-10-CM

## 2017-07-08 DIAGNOSIS — E785 Hyperlipidemia, unspecified: Secondary | ICD-10-CM

## 2017-07-08 DIAGNOSIS — E119 Type 2 diabetes mellitus without complications: Secondary | ICD-10-CM

## 2017-07-08 DIAGNOSIS — N183 Chronic kidney disease, stage 3 unspecified: Secondary | ICD-10-CM

## 2017-07-08 DIAGNOSIS — F411 Generalized anxiety disorder: Secondary | ICD-10-CM

## 2017-07-08 LAB — BASIC METABOLIC PANEL
BUN: 21 mg/dL (ref 6–23)
CALCIUM: 9.7 mg/dL (ref 8.4–10.5)
CO2: 29 mEq/L (ref 19–32)
CREATININE: 1.8 mg/dL — AB (ref 0.40–1.20)
Chloride: 106 mEq/L (ref 96–112)
GFR: 34.6 mL/min — AB (ref >60.00)
GLUCOSE: 110 mg/dL — AB (ref 70–99)
Potassium: 3.9 mEq/L (ref 3.5–5.1)
Sodium: 143 mEq/L (ref 135–145)

## 2017-07-08 LAB — LIPID PANEL
Cholesterol: 149 mg/dL (ref 0–200)
HDL: 50.3 mg/dL (ref >39.00)
LDL Cholesterol: 69 mg/dL (ref 0–99)
NONHDL: 98.37
Total CHOL/HDL Ratio: 3
Triglycerides: 147 mg/dL (ref 0.0–149.0)
VLDL: 29.4 mg/dL (ref 0.0–40.0)

## 2017-07-08 LAB — HEMOGLOBIN A1C: HEMOGLOBIN A1C: 6.1 % (ref 4.6–6.5)

## 2017-07-08 LAB — HEPATIC FUNCTION PANEL
ALK PHOS: 79 U/L (ref 39–117)
ALT: 21 U/L (ref 0–35)
AST: 19 U/L (ref 0–37)
Albumin: 3.8 g/dL (ref 3.5–5.2)
BILIRUBIN DIRECT: 0.2 mg/dL (ref 0.0–0.3)
BILIRUBIN TOTAL: 0.7 mg/dL (ref 0.2–1.2)
Total Protein: 7.1 g/dL (ref 6.0–8.3)

## 2017-07-08 MED ORDER — CITALOPRAM HYDROBROMIDE 10 MG PO TABS: 10.0000 mg | ORAL_TABLET | Freq: Every day | ORAL | 3 refills | Status: DC

## 2017-07-08 NOTE — Patient Instructions (Addendum)
Please take all new medication as prescribed - the celexa 10 mg per day for nerves  Please continue all other medications as before, and refills have been done if requested.  Please have the pharmacy call with any other refills you may need.  Please continue your efforts at being more active, low cholesterol diabetic diet, and weight control.  Please keep your appointments with your specialists as you may have planned  Please go to the LAB in the Basement (turn left off the elevator) for the tests to be done today  You will be contacted by phone if any changes need to be made immediately.  Otherwise, you will receive a letter about your results with an explanation, but please check with MyChart first.  Please remember to sign up for MyChart if you have not done so, as this will be important to you in the future with finding out test results, communicating by private email, and scheduling acute appointments online when needed.  Please return in 6 months, or sooner if needed, with Lab testing done 3-5 days before

## 2017-07-08 NOTE — Assessment & Plan Note (Signed)
stable overall by history and exam, recent data reviewed with pt, and pt to continue medical treatment as before,  to f/u any worsening symptoms or concerns, for f/u lab and cont f/u with renal

## 2017-07-08 NOTE — Assessment & Plan Note (Signed)
Lab Results  Component Value Date   LDLCALC 122 (H) 02/12/2017  goal ldl < 70 ; increased last visit, for f/u today after better diet complaince
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
## 2017-07-08 NOTE — Assessment & Plan Note (Signed)
Mild uncontrolled, likely situational, stable overall by history and exam, recent data reviewed with pt, and pt to continue medical treatment as before,  to f/u any worsening symptoms or concerns BP Readings from Last 3 Encounters:  07/08/17 (!) 142/94  03/05/17 126/76  02/12/17 132/84

## 2017-07-08 NOTE — Assessment & Plan Note (Signed)
stable overall by history and exam, recent data reviewed with pt, and pt to continue medical treatment as before,  to f/u any worsening symptoms or concerns, for f/u a1c 

## 2017-07-08 NOTE — Assessment & Plan Note (Addendum)
With some mild overall increase, reassured, declines counseling but will accept celexa 10 qd

## 2017-07-10 ENCOUNTER — Other Ambulatory Visit: Payer: Self-pay | Admitting: Internal Medicine

## 2017-07-10 MED ORDER — DILTIAZEM HCL 120 MG PO TABS: 120.0000 mg | ORAL_TABLET | Freq: Every day | ORAL | 3 refills | Status: DC

## 2017-07-10 MED ORDER — OXYCODONE-ACETAMINOPHEN 5-325 MG PO TABS: 1.0000 | ORAL_TABLET | Freq: Every day | ORAL | 0 refills | Status: DC | PRN

## 2017-07-10 MED ORDER — DIAZEPAM 5 MG PO TABS: ORAL_TABLET | ORAL | 5 refills | Status: DC

## 2017-07-10 NOTE — Telephone Encounter (Signed)
Copied from [place] [id number]. Topic: Quick Communication - Rx Refill/Question >> Jul 10, 2017  3:25 PM Scherrie Gerlach wrote: Medication: oxyCODONE-acetaminophen (PERCOCET/ROXICET) 5-325 MG tablet diazepam (VALIUM) 5 MG tablet diltiazem (CARDIZEM) 120 MG tablet  Pt states she was seen 7/08 and advised the dr she needed these meds for her headache. Pt states she has had an ongoing headache since then and cannot get rid of it  Pt states the pain is making her sick.  Pt is requesting these meds asap.  Pt states she is not going to take the citalopram (CELEXA) 10 MG tablet. After reading the insert, she was not comfortable taking them.  [place], [place] - [place] [phone] (Phone) [phone] (Fax)

## 2017-07-10 NOTE — Telephone Encounter (Signed)
All done erx 

## 2017-07-11 NOTE — Telephone Encounter (Signed)
Pt informed

## 2017-08-13 ENCOUNTER — Ambulatory Visit: Payer: Medicare HMO | Admitting: Internal Medicine

## 2017-08-20 DIAGNOSIS — H02412 Mechanical ptosis of left eyelid: Secondary | ICD-10-CM | POA: Diagnosis not present

## 2017-08-20 DIAGNOSIS — E119 Type 2 diabetes mellitus without complications: Secondary | ICD-10-CM | POA: Diagnosis not present

## 2017-08-20 DIAGNOSIS — H401131 Primary open-angle glaucoma, bilateral, mild stage: Secondary | ICD-10-CM | POA: Diagnosis not present

## 2017-12-05 ENCOUNTER — Other Ambulatory Visit: Payer: Self-pay

## 2017-12-05 MED ORDER — METOPROLOL SUCCINATE ER 100 MG PO TB24: 100.0000 mg | ORAL_TABLET | Freq: Every day | ORAL | 1 refills | Status: DC

## 2017-12-17 DIAGNOSIS — K432 Incisional hernia without obstruction or gangrene: Secondary | ICD-10-CM | POA: Diagnosis not present

## 2017-12-17 DIAGNOSIS — R1030 Lower abdominal pain, unspecified: Secondary | ICD-10-CM | POA: Diagnosis not present

## 2017-12-23 ENCOUNTER — Other Ambulatory Visit: Payer: Self-pay | Admitting: General Surgery

## 2017-12-23 DIAGNOSIS — R1030 Lower abdominal pain, unspecified: Secondary | ICD-10-CM

## 2018-01-07 ENCOUNTER — Ambulatory Visit: Payer: Medicare HMO | Admitting: Internal Medicine

## 2018-01-09 ENCOUNTER — Other Ambulatory Visit: Payer: Self-pay | Admitting: General Surgery

## 2018-01-09 ENCOUNTER — Ambulatory Visit
Admission: RE | Admit: 2018-01-09 | Discharge: 2018-01-09 | Disposition: A | Discharge status: Home / Self Care | Payer: Medicare HMO | Source: Ambulatory Visit | Attending: General Surgery | Admitting: General Surgery

## 2018-01-09 ENCOUNTER — Telehealth: Payer: Self-pay | Admitting: *Deleted

## 2018-01-09 DIAGNOSIS — R1030 Lower abdominal pain, unspecified: Secondary | ICD-10-CM

## 2018-01-09 NOTE — Telephone Encounter (Signed)
LVM for patient to call back in regards to scheduling AWV with our health coach.  

## 2018-01-10 ENCOUNTER — Encounter: Payer: Self-pay | Admitting: Internal Medicine

## 2018-01-10 ENCOUNTER — Telehealth: Payer: Self-pay | Admitting: Internal Medicine

## 2018-01-10 ENCOUNTER — Ambulatory Visit (INDEPENDENT_AMBULATORY_CARE_PROVIDER_SITE_OTHER): Payer: Medicare HMO | Admitting: Internal Medicine

## 2018-01-10 VITALS — BP 116/72 | HR 54 | Temp 97.9°F | Ht 62.0 in

## 2018-01-10 DIAGNOSIS — E119 Type 2 diabetes mellitus without complications: Secondary | ICD-10-CM

## 2018-01-10 DIAGNOSIS — Z0000 Encounter for general adult medical examination without abnormal findings: Secondary | ICD-10-CM

## 2018-01-10 DIAGNOSIS — G8929 Other chronic pain: Secondary | ICD-10-CM

## 2018-01-10 DIAGNOSIS — I10 Essential (primary) hypertension: Secondary | ICD-10-CM

## 2018-01-10 DIAGNOSIS — M545 Low back pain, unspecified: Secondary | ICD-10-CM

## 2018-01-10 LAB — POCT GLYCOSYLATED HEMOGLOBIN (HGB A1C): Hemoglobin A1C: 5.1 % (ref 4.0–5.6)

## 2018-01-10 MED ORDER — TRAMADOL HCL 50 MG PO TABS: 50.0000 mg | ORAL_TABLET | Freq: Four times a day (QID) | ORAL | 5 refills | Status: DC | PRN

## 2018-01-10 NOTE — Assessment & Plan Note (Signed)

## 2018-01-10 NOTE — Progress Notes (Signed)
Subjective:    Patient ID: Kimberly Castillo, female    DOB: 08-09-1933, 85 y.o.   MRN: [id number]  HPI  Here for wellness and f/u;  Overall doing ok;  Pt denies Chest pain, worsening SOB, DOE, wheezing, orthopnea, PND, worsening LE edema, palpitations, dizziness or syncope.  Pt denies neurological change such as new headache, facial or extremity weakness.  Pt denies polydipsia, polyuria, or low sugar symptoms. Pt states overall good compliance with treatment and medications, good tolerability, and has been trying to follow appropriate diet.  Pt denies worsening depressive symptoms, suicidal ideation or panic. No fever, night sweats, wt loss, loss of appetite, or other constitutional symptoms.  Pt states good ability with ADL's, has low fall risk, home safety reviewed and adequate, no other significant changes in hearing or vision, and not active with exercise. Due for optho appt, and plans to call.   lso dizzy prior to this visit today, weak and fatigue intermittent for several weeks, not sure if related to low volume like dehydration or something else  Plans to drink more fluids. Feels better to lie down and keep legs up.  No fever, Denies urinary symptoms such as dysuria, frequency, urgency, flank pain, hematuria or n/v, fever, chills.  CT abd/pelvis yesterday without acute findings.  Pt denies fever, wt loss, night sweats, loss of appetite, or other constitutional symptoms  Denies urinary symptoms such as dysuria, frequency, urgency, flank pain, hematuria or n/v, fever, chills.  Denies worsening reflux, abd pain, dysphagia, n/v, bowel change or blood.  Pt continues to have recurring LBP without change in severity, bowel or bladder change, fever, wt loss,  worsening LE pain/numbness/weakness, gait change or falls. Past Medical History:  Diagnosis Date  . A-fib ([place])   . Anxiety state 12/22/2013  . Arthritis    hands, back  . Arthritis, lumbar spine 12/19/2012  . Cataracts, bilateral   . Chronic lower  back pain 12/19/2012  . Diabetes mellitus ([place])   . Diverticulitis   . Dysrhythmia   . GERD (gastroesophageal reflux disease)   . Glaucoma   . Headache    hx migraines  . Heart murmur   . History of shingles   . Hyperlipidemia   . Hypertension   . Numbness    rt side of rt leg  . Post herpetic neuralgia 12/19/2012   Past Surgical History:  Procedure Laterality Date  . ABDOMINAL HYSTERECTOMY  1964   partial  . COLON RESECTION N/A 09/02/2013   Procedure: [facility] [place] colectomy;  Surgeon: Excell Seltzer, MD;  Location: [facility];  Service: General;  Laterality: N/A;  . COLONOSCOPY    . COLOSTOMY TAKEDOWN N/A 11/09/2014   Procedure: TAKEDOWN [facility] [place] COLOSTOMY;  Surgeon: Excell Seltzer, MD;  Location: [facility];  Service: General;  Laterality: N/A;  . [place]   left  . POLYPECTOMY      reports that she quit smoking about 20 years ago. Her smoking use included cigarettes. She started smoking about 26 years ago. She has never used smokeless tobacco. She reports that she does not drink alcohol or use drugs. family history includes Hypertension in her maternal grandmother and mother. No Known Allergies Current Outpatient Medications on File Prior to Visit  Medication Sig Dispense Refill  . aspirin EC 81 MG tablet Take 81 mg by mouth daily.    . Blood Glucose Monitoring Suppl (ONE TOUCH ULTRA 2) w/Device KIT USE as directed 1 each 0  . Cholecalciferol (VITAMIN D3)  5000 UNITS CAPS Take 1 capsule by mouth daily.    . colestipol (COLESTID) 1 g tablet Take 1 tablet (1 g total) by mouth 2 (two) times daily. 180 tablet 3  . dexamethasone (DECADRON) 4 MG tablet Take 1 tablet (4 mg total) by mouth daily. 30 tablet 0  . diazepam (VALIUM) 5 MG tablet TAKE ONE-HALF TO ONE TABLET BY MOUTH ONCE DAILY AS NEEDED 30 tablet 5  . diltiazem (CARDIZEM) 120 MG tablet Take 1 tablet (120 mg total) by mouth daily. 90 tablet 3  . gabapentin (NEURONTIN) 100 MG capsule Take 2 capsules  (200 mg total) by mouth 3 (three) times daily. 180 capsule 5  . glucose blood (ONE TOUCH ULTRA TEST) test strip Use as instructed 100 each 12  . hyoscyamine (LEVSIN SL) 0.125 MG SL tablet Place 1-2 tablets (0.125-0.25 mg total) under the tongue every 4 (four) hours as needed. 60 tablet 1  . Lancets MISC Use as directed once daily 100 each 11  . latanoprost (XALATAN) 0.005 % ophthalmic solution Place 1 drop into both eyes at bedtime.    . metFORMIN (GLUCOPHAGE) 500 MG tablet Take 1 tablet (500 mg total) by mouth 2 (two) times daily with a meal. 180 tablet 3  . metoprolol succinate (TOPROL-XL) 100 MG 24 hr tablet Take 1 tablet (100 mg total) by mouth daily. Take with or immediately following a meal. 90 tablet 1  . oxyCODONE-acetaminophen (PERCOCET/ROXICET) 5-325 MG tablet Take 1 tablet by mouth daily as needed for moderate pain. 30 tablet 0  . pantoprazole (PROTONIX) 40 MG tablet Take 1 tablet (40 mg total) by mouth daily. 90 tablet 3  . potassium chloride (K-DUR) 10 MEQ tablet 4 tabs by mouth per day 360 tablet 3  . simvastatin (ZOCOR) 40 MG tablet Take 1 tablet (40 mg total) by mouth at bedtime. DC any old scripts for same medication 90 tablet 3  . sulindac (CLINORIL) 200 MG tablet TAKE ONE TABLET BY MOUTH TWICE DAILY AS NEEDED 180 tablet 1  . timolol (BETIMOL) 0.5 % ophthalmic solution Place 1 drop into both eyes daily.     No current facility-administered medications on file prior to visit.   ROS: Constitutional: Negative for other unusual diaphoresis, sweats, appetite or weight changes HENT: Negative for other worsening hearing loss, ear pain, facial swelling, mouth sores or neck stiffness.   Eyes: Negative for other worsening pain, redness or other visual disturbance.  Respiratory: Negative for other stridor or swelling Cardiovascular: Negative for other palpitations or other chest pain  Gastrointestinal: Negative for worsening diarrhea or loose stools, blood in stool, distention or other  pain Genitourinary: Negative for hematuria, flank pain or other change in urine volume.  Musculoskeletal: Negative for myalgias or other joint swelling.  Skin: Negative for other color change, or other wound or worsening drainage.  Neurological: Negative for other syncope or numbness. Hematological: Negative for other adenopathy or swelling Psychiatric/Behavioral: Negative for hallucinations, other worsening agitation, SI, self-injury, or new decreased concentration All other system neg per pt    Objective:   Physical Exam BP 116/72   Pulse (!) 54   Temp 97.9 F (36.6 C) (Oral)   Ht 5' 2"  (1.575 m)   SpO2 98%   BMI 27.80 kg/m  VS noted,  Constitutional: Pt is oriented to person, place, and time. Appears well-developed and well-nourished, in no significant distress and comfortable Head: Normocephalic and atraumatic  Eyes: Conjunctivae and EOM are normal. Pupils are equal, round, and reactive to light Right  Ear: External ear normal without discharge Left Ear: External ear normal without discharge Nose: Nose without discharge or deformity Mouth/Throat: Oropharynx is without other ulcerations and moist  Neck: Normal range of motion. Neck supple. No JVD present. No tracheal deviation present or significant neck LA or mass Cardiovascular: Normal rate, regular rhythm, normal heart sounds and intact distal pulses.   Pulmonary/Chest: WOB normal and breath sounds without rales or wheezing  Abdominal: Soft. Bowel sounds are normal. NT. No HSM  Musculoskeletal: Normal range of motion. Exhibits no edema Lymphadenopathy: Has no other cervical adenopathy.  Neurological: Pt is alert and oriented to person, place, and time. Pt has normal reflexes. No cranial nerve deficit. Motor grossly intact, Gait intact Skin: Skin is warm and dry. No rash noted or new ulcerations Psychiatric:  Has normal mood and affect. Behavior is normal without agitation No other exam findings Lab Results  Component Value  Date   WBC 5.3 02/12/2017   HGB 12.7 02/12/2017   HCT 37.9 02/12/2017   PLT 239.0 02/12/2017   GLUCOSE 110 (H) 07/08/2017   CHOL 149 07/08/2017   TRIG 147.0 07/08/2017   HDL 50.30 07/08/2017   LDLDIRECT 129.0 02/02/2016   LDLCALC 69 07/08/2017   ALT 21 07/08/2017   AST 19 07/08/2017   NA 143 07/08/2017   K 3.9 07/08/2017   CL 106 07/08/2017   CREATININE 1.80 (H) 07/08/2017   BUN 21 07/08/2017   CO2 29 07/08/2017   TSH 1.97 02/12/2017   HGBA1C 5.1 01/10/2018   MICROALBUR 224.0 (H) 02/12/2017    POCT glycosylated hemoglobin (Hb A1C)  Order: [id number]  Status:  Final result Visible to patient:  No (Not Released) Dx:  Type 2 diabetes mellitus without comp...   Ref Range & Units 16:26 (01/10/18) 79moago (07/08/17) 167mogo (02/12/17) 1y77yro (08/10/16) 94yr37yr (02/02/16) 29yr 65yr(08/25/15) 29yr a54yr1/25/17)  Hemoglobin A1C 4.0 - 5.6 % 5.1  6.1 R, CM 6.1 R, CM 6.0 R, CM 6.3 R, CM 6.2 R, CM 6.3 R, CM           Assessment & Plan:

## 2018-01-10 NOTE — Telephone Encounter (Signed)
Patient called to the office to cancel her appointment today with Dr. Jenny Reichmann because she's dizzy and can't drive to the appointment. Amber, [place] Agent called me and I advised her to tell the patient to find someone to bring her to the appointment, because she will need to be evaluated. She advised the patient of my recommendation and she says the patient says she will find someone. I called the patient to follow up and to advise if she couldn't find someone to call 911 to go to ED. The number I called was her son Emmerson Shuffield. I advised that the patient is dizzy and will need to come to the appointment today, he says he will bring her.

## 2018-01-10 NOTE — Assessment & Plan Note (Signed)
stable overall by history and exam, recent data reviewed with pt, and pt to continue medical treatment as before,  to f/u any worsening symptoms or concerns, for tramadol refill

## 2018-01-10 NOTE — Assessment & Plan Note (Signed)
stable overall by history and exam, recent data reviewed with pt, and pt to continue medical treatment as before,  to f/u any worsening symptoms or concerns  

## 2018-01-10 NOTE — Assessment & Plan Note (Signed)
stable overall by history and exam, recent data reviewed with pt, and pt to continue medical treatment as before,  to f/u any worsening symptoms or concerns, for f/u a1c today

## 2018-01-10 NOTE — Patient Instructions (Addendum)
Your A1c was OK today  Please continue all other medications as before, and refills have been done if requested - the tramadol  Please have the pharmacy call with any other refills you may need.  Please continue your efforts at being more active, low cholesterol diet, and weight control.  You are otherwise up to date with prevention measures today.  Please keep your appointments with your specialists as you may have planned  Please return in 6 months, or sooner if needed, with Lab testing done 3-5 days before

## 2018-01-10 NOTE — Telephone Encounter (Signed)
Noted  

## 2018-01-14 ENCOUNTER — Other Ambulatory Visit: Payer: Self-pay | Admitting: Internal Medicine

## 2018-01-14 MED ORDER — TRAMADOL HCL 50 MG PO TABS: 50.0000 mg | ORAL_TABLET | Freq: Four times a day (QID) | ORAL | 5 refills | Status: DC | PRN

## 2018-01-30 DIAGNOSIS — R1030 Lower abdominal pain, unspecified: Secondary | ICD-10-CM | POA: Diagnosis not present

## 2018-01-30 DIAGNOSIS — K432 Incisional hernia without obstruction or gangrene: Secondary | ICD-10-CM | POA: Diagnosis not present

## 2018-02-11 DIAGNOSIS — E119 Type 2 diabetes mellitus without complications: Secondary | ICD-10-CM | POA: Diagnosis not present

## 2018-02-11 DIAGNOSIS — H401131 Primary open-angle glaucoma, bilateral, mild stage: Secondary | ICD-10-CM | POA: Diagnosis not present

## 2018-02-11 DIAGNOSIS — H02412 Mechanical ptosis of left eyelid: Secondary | ICD-10-CM | POA: Diagnosis not present

## 2018-03-12 ENCOUNTER — Telehealth: Payer: Self-pay | Admitting: Internal Medicine

## 2018-03-12 NOTE — Telephone Encounter (Signed)
This is not a usual medication that I monitor and refill, and we would need to know the purpose of the medication and how it was previously prescribed, so I would have to hold off for now. thanks

## 2018-03-12 NOTE — Telephone Encounter (Signed)
Copied from [place] #[id number]. Topic: Quick Communication - Rx Refill/Question >> Mar 12, 2018  3:54 PM Margot Ables wrote: Medication: dexamethasone (DECADRON) 4 MG tablet - pt requesting this from Dr. Jenny Reichmann as she doesn't see ortho anymore - pt takes PRN a few times per month  Has the patient contacted their pharmacy? Yes - RX expired Preferred Pharmacy (with phone number or street name): [place], [place] - [address] [place] [phone] (Phone) [phone] (Fax)

## 2018-03-13 NOTE — Telephone Encounter (Signed)
Called pt, LVM to discuss.  

## 2018-04-15 ENCOUNTER — Telehealth: Payer: Self-pay | Admitting: *Deleted

## 2018-04-15 NOTE — Telephone Encounter (Signed)
LVM for patient to call back in regards to scheduling AWV with our health coach.  

## 2018-04-16 ENCOUNTER — Other Ambulatory Visit (INDEPENDENT_AMBULATORY_CARE_PROVIDER_SITE_OTHER): Payer: Self-pay | Admitting: Orthopaedic Surgery

## 2018-04-16 ENCOUNTER — Other Ambulatory Visit: Payer: Self-pay | Admitting: Internal Medicine

## 2018-04-16 DIAGNOSIS — K2100 Gastro-esophageal reflux disease with esophagitis, without bleeding: Secondary | ICD-10-CM

## 2018-04-16 NOTE — Telephone Encounter (Signed)
Done erx 

## 2018-04-17 ENCOUNTER — Other Ambulatory Visit (INDEPENDENT_AMBULATORY_CARE_PROVIDER_SITE_OTHER): Payer: Self-pay | Admitting: Orthopaedic Surgery

## 2018-04-17 NOTE — Telephone Encounter (Signed)
Please advise 

## 2018-04-18 ENCOUNTER — Telehealth (INDEPENDENT_AMBULATORY_CARE_PROVIDER_SITE_OTHER): Payer: Self-pay

## 2018-04-18 MED ORDER — DEXAMETHASONE 4 MG PO TABS: 4.0000 mg | ORAL_TABLET | Freq: Every day | ORAL | 0 refills | Status: DC

## 2018-04-18 NOTE — Telephone Encounter (Signed)
I spoke with pharmacist. She states that they did not receive approval for medication that Dr. Lorin Mercy sent in electronically. Medication resent.

## 2018-04-18 NOTE — Telephone Encounter (Signed)
[facility] called and states patient was prescribed Dexamethasone (DECADRON) 4 MG tablet. Would like for Dr Lorin Mercy to approve this medication. Needs approval. Would like a GL [phone]

## 2018-05-12 ENCOUNTER — Telehealth: Payer: Self-pay | Admitting: *Deleted

## 2018-05-12 NOTE — Telephone Encounter (Signed)
OV scheduled 05/13/18 @ 3:40. Screened for [place].

## 2018-05-12 NOTE — Telephone Encounter (Signed)
Copied from [place] [phone]. Topic: Appointment Scheduling - Scheduling Inquiry for Clinic >> May 12, 2018  2:34 PM Vernona Rieger wrote: Reason for CRM: patient's son, Jeneen Rinks would like to have an appt with Dr Jenny Reichmann for his mom. He said she is sleeping a lot, her memory is sketchy and wants a review of her meds. Please call Jeneen Rinks >> May 12, 2018  3:02 PM Para Skeans A wrote: Please advise if this can be in office visit.

## 2018-05-12 NOTE — Telephone Encounter (Signed)
Ok for OV?

## 2018-05-13 ENCOUNTER — Ambulatory Visit (INDEPENDENT_AMBULATORY_CARE_PROVIDER_SITE_OTHER): Payer: Medicare HMO | Admitting: Internal Medicine

## 2018-05-13 ENCOUNTER — Encounter: Payer: Self-pay | Admitting: Internal Medicine

## 2018-05-13 ENCOUNTER — Other Ambulatory Visit: Payer: Self-pay

## 2018-05-13 VITALS — BP 126/74 | HR 60 | Ht 62.0 in

## 2018-05-13 DIAGNOSIS — E559 Vitamin D deficiency, unspecified: Secondary | ICD-10-CM | POA: Diagnosis not present

## 2018-05-13 DIAGNOSIS — E119 Type 2 diabetes mellitus without complications: Secondary | ICD-10-CM

## 2018-05-13 DIAGNOSIS — R300 Dysuria: Secondary | ICD-10-CM | POA: Diagnosis not present

## 2018-05-13 DIAGNOSIS — E538 Deficiency of other specified B group vitamins: Secondary | ICD-10-CM

## 2018-05-13 DIAGNOSIS — R413 Other amnesia: Secondary | ICD-10-CM | POA: Diagnosis not present

## 2018-05-13 DIAGNOSIS — N183 Chronic kidney disease, stage 3 unspecified: Secondary | ICD-10-CM

## 2018-05-13 DIAGNOSIS — K2100 Gastro-esophageal reflux disease with esophagitis, without bleeding: Secondary | ICD-10-CM

## 2018-05-13 DIAGNOSIS — R29818 Other symptoms and signs involving the nervous system: Secondary | ICD-10-CM

## 2018-05-13 DIAGNOSIS — I10 Essential (primary) hypertension: Secondary | ICD-10-CM

## 2018-05-13 MED ORDER — GLUCOSE BLOOD VI STRP: ORAL_STRIP | 12 refills | Status: DC

## 2018-05-13 MED ORDER — LANCETS MISC: 11 refills | Status: DC

## 2018-05-13 MED ORDER — ONETOUCH ULTRA 2 W/DEVICE KIT: PACK | 0 refills | Status: DC

## 2018-05-13 MED ORDER — PANTOPRAZOLE SODIUM 40 MG PO TBEC: 40.0000 mg | DELAYED_RELEASE_TABLET | Freq: Every day | ORAL | 0 refills | Status: DC

## 2018-05-13 MED ORDER — SIMVASTATIN 40 MG PO TABS: 40.0000 mg | ORAL_TABLET | Freq: Every day | ORAL | 3 refills | Status: DC

## 2018-05-13 MED ORDER — METFORMIN HCL 500 MG PO TABS: 500.0000 mg | ORAL_TABLET | Freq: Two times a day (BID) | ORAL | 3 refills | Status: DC

## 2018-05-13 MED ORDER — DILTIAZEM HCL 120 MG PO TABS: 120.0000 mg | ORAL_TABLET | Freq: Every day | ORAL | 3 refills | Status: DC

## 2018-05-13 NOTE — Assessment & Plan Note (Addendum)
Etiology unclear, for cxr and Head MRI r/p cva, check urine cx and labs as ordered  Note:  Total time for pt hx, exam, review of record with pt in the room, determination of diagnoses and plan for further eval and tx is > 40 min, with over 50% spent in coordination and counseling of patient including the differential dx, tx, further evaluation and other management of memory changes, HTN, DM, CKD

## 2018-05-13 NOTE — Assessment & Plan Note (Signed)
stable overall by history and exam, recent data reviewed with pt, and pt to continue medical treatment as before,  to f/u any worsening symptoms or concerns  

## 2018-05-13 NOTE — Assessment & Plan Note (Signed)
stable overall by history and exam, recent data reviewed with pt, and pt to continue medical treatment as before,  to f/u any worsening symptoms or concerns, for a1c with labs, note pt has not likely been taking the metformin recently

## 2018-05-13 NOTE — Patient Instructions (Signed)
Please take all new medication as prescribed - see your list today  Please continue all other medications as before, and refills have been done if requested.  Please have the pharmacy call with any other refills you may need.  Please continue your efforts at being more active, low cholesterol diet, and weight control.  You are otherwise up to date with prevention measures today.  Please keep your appointments with your specialists as you may have planned  You will be contacted regarding the referral for: MRI for the head  Please go to the XRAY Department in the Basement (go straight as you get off the elevator) for the x-ray testing  Please go to the LAB in the Basement (turn left off the elevator) for the tests to be done today  You will be contacted by phone if any changes need to be made immediately.  Otherwise, you will receive a letter about your results with an explanation, but please check with MyChart first.  Please remember to sign up for MyChart if you have not done so, as this will be important to you in the future with finding out test results, communicating by private email, and scheduling acute appointments online when needed.  Please return in 1 month, or sooner if needed

## 2018-05-13 NOTE — Progress Notes (Signed)
Subjective:    Patient ID: Kimberly Castillo, female    DOB: 06/02/1933, 85 y.o.   MRN: [id number]  HPI  Here with son, c/o sudden onset change in memory ability for about 3 -4 days without a clear reason or other symptoms;  Pt denies fever, wt loss, night sweats, loss of appetite, or other constitutional symptoms Denies urinary symptoms such as dysuria, frequency, urgency, flank pain, hematuria or n/v, fever, chills.  No ST, cough and Pt denies chest pain, increased sob or doe, wheezing, orthopnea, PND, increased LE swelling, palpitations, dizziness or syncope.  Pt denies new neurological symptoms such as new headache, or facial or extremity weakness or numbness   Pt denies polydipsia, polyuria, but pt appears to have longer term issue with med non compliance since the family started checking on this a few days ago.  Has full bottles expired meds, and no med for DM, appears to not be taking the meds on her list, including the diltiazem and potassium. Past Medical History:  Diagnosis Date  . A-fib ([place])   . Anxiety state 12/22/2013  . Arthritis    hands, back  . Arthritis, lumbar spine 12/19/2012  . Cataracts, bilateral   . Chronic lower back pain 12/19/2012  . Diabetes mellitus ([place])   . Diverticulitis   . Dysrhythmia   . GERD (gastroesophageal reflux disease)   . Glaucoma   . Headache    hx migraines  . Heart murmur   . History of shingles   . Hyperlipidemia   . Hypertension   . Numbness    rt side of rt leg  . Post herpetic neuralgia 12/19/2012   Past Surgical History:  Procedure Laterality Date  . ABDOMINAL HYSTERECTOMY  1964   partial  . COLON RESECTION N/A 09/02/2013   Procedure: [facility] [place] colectomy;  Surgeon: Excell Seltzer, MD;  Location: [facility];  Service: General;  Laterality: N/A;  . COLONOSCOPY    . COLOSTOMY TAKEDOWN N/A 11/09/2014   Procedure: TAKEDOWN [facility] [place] COLOSTOMY;  Surgeon: Excell Seltzer, MD;  Location: [facility];  Service: General;  Laterality: N/A;  . [place]   left  . POLYPECTOMY      reports that she quit smoking about 20 years ago. Her smoking use included cigarettes. She started smoking about 26 years ago. She has never used smokeless tobacco. She reports that she does not drink alcohol or use drugs. family history includes Hypertension in her maternal grandmother and mother. No Known Allergies Current Outpatient Medications on File Prior to Visit  Medication Sig Dispense Refill  . aspirin EC 81 MG tablet Take 81 mg by mouth daily.    [name] latanoprost (XALATAN) 0.005 % ophthalmic solution Place 1 drop into both eyes at bedtime.    . timolol (BETIMOL) 0.5 % ophthalmic solution Place 1 drop into both eyes daily.     No current facility-administered medications on file prior to visit.    Review of Systems  Constitutional: Negative for other unusual diaphoresis or sweats HENT: Negative for ear discharge or swelling Eyes: Negative for other worsening visual disturbances Respiratory: Negative for stridor or other swelling  Gastrointestinal: Negative for worsening distension or other blood Genitourinary: Negative for retention or other urinary change Musculoskeletal: Negative for other MSK pain or swelling Skin: Negative for color change or other new lesions Neurological: Negative for worsening tremors and other numbness  Psychiatric/Behavioral: Negative for worsening agitation or other fatigue All other system neg per pt  Objective:   Physical Exam BP 126/74   Pulse 60   Ht 5\' 2"  (1.575 m)   SpO2 95%   BMI 27.80 kg/m  VS noted,  Constitutional: Pt appears in NAD HENT: Head: NCAT.  Right Ear: External ear normal.  Left Ear: External ear normal.  Eyes: . Pupils are equal, round, and reactive to light. Conjunctivae and EOM are normal Nose: without d/c or deformity Neck: Neck supple. Gross normal ROM Cardiovascular: Normal rate and regular rhythm.   Pulmonary/Chest: Effort normal and breath sounds  without rales or wheezing.  Abd:  Soft, NT, ND, + BS, no organomegaly Neurological: Pt is alert. At orientation to name and office clinic only, motor intact 5/5,  Skin: Skin is warm. No rashes, other new lesions, no LE edema Psychiatric: Pt behavior is normal without agitation  No other exam findings Lab Results  Component Value Date   WBC 5.3 02/12/2017   HGB 12.7 02/12/2017   HCT 37.9 02/12/2017   PLT 239.0 02/12/2017   GLUCOSE 110 (H) 07/08/2017   CHOL 149 07/08/2017   TRIG 147.0 07/08/2017   HDL 50.30 07/08/2017   LDLDIRECT 129.0 02/02/2016   LDLCALC 69 07/08/2017   ALT 21 07/08/2017   AST 19 07/08/2017   NA 143 07/08/2017   K 3.9 07/08/2017   CL 106 07/08/2017   CREATININE 1.80 (H) 07/08/2017   BUN 21 07/08/2017   CO2 29 07/08/2017   TSH 1.97 02/12/2017   HGBA1C 5.1 01/10/2018   MICROALBUR 224.0 (H) 02/12/2017       Assessment & Plan:

## 2018-05-14 ENCOUNTER — Other Ambulatory Visit: Payer: Self-pay | Admitting: Internal Medicine

## 2018-05-14 MED ORDER — GLUCOSE BLOOD VI STRP: ORAL_STRIP | 12 refills | Status: DC

## 2018-05-15 ENCOUNTER — Ambulatory Visit (INDEPENDENT_AMBULATORY_CARE_PROVIDER_SITE_OTHER)
Admission: RE | Admit: 2018-05-15 | Discharge: 2018-05-15 | Disposition: A | Discharge status: Home / Self Care | Payer: Medicare HMO | Source: Ambulatory Visit | Attending: Internal Medicine | Admitting: Internal Medicine

## 2018-05-15 ENCOUNTER — Other Ambulatory Visit: Payer: Self-pay

## 2018-05-15 ENCOUNTER — Other Ambulatory Visit (INDEPENDENT_AMBULATORY_CARE_PROVIDER_SITE_OTHER): Payer: Medicare HMO

## 2018-05-15 DIAGNOSIS — E538 Deficiency of other specified B group vitamins: Secondary | ICD-10-CM | POA: Diagnosis not present

## 2018-05-15 DIAGNOSIS — E119 Type 2 diabetes mellitus without complications: Secondary | ICD-10-CM

## 2018-05-15 DIAGNOSIS — R413 Other amnesia: Secondary | ICD-10-CM | POA: Diagnosis not present

## 2018-05-15 DIAGNOSIS — R300 Dysuria: Secondary | ICD-10-CM

## 2018-05-15 DIAGNOSIS — E559 Vitamin D deficiency, unspecified: Secondary | ICD-10-CM

## 2018-05-15 DIAGNOSIS — Z8659 Personal history of other mental and behavioral disorders: Secondary | ICD-10-CM | POA: Diagnosis not present

## 2018-05-15 LAB — CBC WITH DIFFERENTIAL/PLATELET
Basophils Absolute: 0 10*3/uL (ref 0.0–0.1)
Basophils Relative: 0.8 % (ref 0.0–3.0)
Eosinophils Absolute: 0.1 10*3/uL (ref 0.0–0.7)
Eosinophils Relative: 1.6 % (ref 0.0–5.0)
HCT: 36.1 % (ref 36.0–46.0)
Hemoglobin: 12.1 g/dL (ref 12.0–15.0)
Lymphocytes Relative: 31.4 % (ref 12.0–46.0)
Lymphs Abs: 1.8 10*3/uL (ref 0.7–4.0)
MCHC: 33.5 g/dL (ref 30.0–36.0)
MCV: 97.6 fl (ref 78.0–100.0)
Monocytes Absolute: 0.4 10*3/uL (ref 0.1–1.0)
Monocytes Relative: 6.9 % (ref 3.0–12.0)
Neutro Abs: 3.5 10*3/uL (ref 1.4–7.7)
Neutrophils Relative %: 59.3 % (ref 43.0–77.0)
Platelets: 201 10*3/uL (ref 150.0–400.0)
RBC: 3.7 Mil/uL — ABNORMAL LOW (ref 3.87–5.11)
RDW: 14.3 % (ref 11.5–15.5)
WBC: 5.9 10*3/uL (ref 4.0–10.5)

## 2018-05-15 LAB — VITAMIN B12: Vitamin B-12: 363 pg/mL (ref 211–911)

## 2018-05-15 LAB — URINALYSIS, ROUTINE W REFLEX MICROSCOPIC
Bilirubin Urine: NEGATIVE
Ketones, ur: NEGATIVE
Leukocytes,Ua: NEGATIVE
Nitrite: NEGATIVE
Specific Gravity, Urine: >=1.03 — AB (ref 1.000–1.030)
Total Protein, Urine: >=300 — AB
Urine Glucose: NEGATIVE
Urobilinogen, UA: 0.2 (ref 0.0–1.0)
pH: 6 (ref 5.0–8.0)

## 2018-05-15 LAB — BASIC METABOLIC PANEL
BUN: 32 mg/dL — ABNORMAL HIGH (ref 6–23)
CO2: 26 mEq/L (ref 19–32)
Calcium: 9.2 mg/dL (ref 8.4–10.5)
Chloride: 106 mEq/L (ref 96–112)
Creatinine, Ser: 2.09 mg/dL — ABNORMAL HIGH (ref 0.40–1.20)
GFR: 27.34 mL/min — ABNORMAL LOW (ref >60.00)
Glucose, Bld: 190 mg/dL — ABNORMAL HIGH (ref 70–99)
Potassium: 3.7 mEq/L (ref 3.5–5.1)
Sodium: 141 mEq/L (ref 135–145)

## 2018-05-15 LAB — LIPID PANEL
Cholesterol: 211 mg/dL — ABNORMAL HIGH (ref 0–200)
HDL: 47.5 mg/dL (ref >39.00)
LDL Cholesterol: 124 mg/dL — ABNORMAL HIGH (ref 0–99)
NonHDL: 163.49
Total CHOL/HDL Ratio: 4
Triglycerides: 198 mg/dL — ABNORMAL HIGH (ref 0.0–149.0)
VLDL: 39.6 mg/dL (ref 0.0–40.0)

## 2018-05-15 LAB — VITAMIN D 25 HYDROXY (VIT D DEFICIENCY, FRACTURES): VITD: 15.51 ng/mL — ABNORMAL LOW (ref 30.00–100.00)

## 2018-05-15 LAB — MICROALBUMIN / CREATININE URINE RATIO
Creatinine,U: 248.1 mg/dL
Microalb Creat Ratio: 145.2 mg/g — ABNORMAL HIGH (ref 0.0–30.0)
Microalb, Ur: 360.2 mg/dL — ABNORMAL HIGH (ref 0.0–1.9)

## 2018-05-15 LAB — HEPATIC FUNCTION PANEL
ALT: 12 U/L (ref 0–35)
AST: 18 U/L (ref 0–37)
Albumin: 3.4 g/dL — ABNORMAL LOW (ref 3.5–5.2)
Alkaline Phosphatase: 66 U/L (ref 39–117)
Bilirubin, Direct: 0.1 mg/dL (ref 0.0–0.3)
Total Bilirubin: 0.6 mg/dL (ref 0.2–1.2)
Total Protein: 6.8 g/dL (ref 6.0–8.3)

## 2018-05-15 LAB — TSH: TSH: 1.06 u[IU]/mL (ref 0.35–4.50)

## 2018-05-15 LAB — HEMOGLOBIN A1C: Hgb A1c MFr Bld: 6.4 % (ref 4.6–6.5)

## 2018-05-15 LAB — MAGNESIUM: Magnesium: 1.8 mg/dL (ref 1.5–2.5)

## 2018-05-16 ENCOUNTER — Telehealth: Payer: Self-pay | Admitting: Internal Medicine

## 2018-05-16 ENCOUNTER — Encounter (HOSPITAL_COMMUNITY): Payer: Self-pay

## 2018-05-16 ENCOUNTER — Inpatient Hospital Stay (HOSPITAL_COMMUNITY)
Admission: EM | Admit: 2018-05-16 | Discharge: 2018-05-19 | DRG: 040 | Disposition: A | Discharge status: Home Health | Payer: Medicare HMO | Attending: Internal Medicine | Admitting: Internal Medicine

## 2018-05-16 ENCOUNTER — Ambulatory Visit
Admission: RE | Admit: 2018-05-16 | Discharge: 2018-05-16 | Disposition: A | Discharge status: Home / Self Care | Payer: Medicare HMO | Source: Ambulatory Visit | Attending: Internal Medicine | Admitting: Internal Medicine

## 2018-05-16 ENCOUNTER — Other Ambulatory Visit: Payer: Self-pay

## 2018-05-16 DIAGNOSIS — R9431 Abnormal electrocardiogram [ECG] [EKG]: Secondary | ICD-10-CM

## 2018-05-16 DIAGNOSIS — F411 Generalized anxiety disorder: Secondary | ICD-10-CM | POA: Diagnosis present

## 2018-05-16 DIAGNOSIS — H53461 Homonymous bilateral field defects, right side: Secondary | ICD-10-CM | POA: Diagnosis present

## 2018-05-16 DIAGNOSIS — R413 Other amnesia: Secondary | ICD-10-CM | POA: Diagnosis not present

## 2018-05-16 DIAGNOSIS — E876 Hypokalemia: Secondary | ICD-10-CM | POA: Diagnosis present

## 2018-05-16 DIAGNOSIS — I6622 Occlusion and stenosis of left posterior cerebral artery: Secondary | ICD-10-CM | POA: Diagnosis not present

## 2018-05-16 DIAGNOSIS — I63332 Cerebral infarction due to thrombosis of left posterior cerebral artery: Secondary | ICD-10-CM | POA: Diagnosis not present

## 2018-05-16 DIAGNOSIS — G9341 Metabolic encephalopathy: Secondary | ICD-10-CM | POA: Diagnosis present

## 2018-05-16 DIAGNOSIS — N183 Chronic kidney disease, stage 3 unspecified: Secondary | ICD-10-CM | POA: Diagnosis present

## 2018-05-16 DIAGNOSIS — Z1159 Encounter for screening for other viral diseases: Secondary | ICD-10-CM

## 2018-05-16 DIAGNOSIS — Z9119 Patient's noncompliance with other medical treatment and regimen: Secondary | ICD-10-CM | POA: Diagnosis not present

## 2018-05-16 DIAGNOSIS — E559 Vitamin D deficiency, unspecified: Secondary | ICD-10-CM | POA: Diagnosis present

## 2018-05-16 DIAGNOSIS — Z87891 Personal history of nicotine dependence: Secondary | ICD-10-CM

## 2018-05-16 DIAGNOSIS — I10 Essential (primary) hypertension: Secondary | ICD-10-CM

## 2018-05-16 DIAGNOSIS — Z9114 Patient's other noncompliance with medication regimen: Secondary | ICD-10-CM | POA: Diagnosis not present

## 2018-05-16 DIAGNOSIS — I129 Hypertensive chronic kidney disease with stage 1 through stage 4 chronic kidney disease, or unspecified chronic kidney disease: Secondary | ICD-10-CM | POA: Diagnosis present

## 2018-05-16 DIAGNOSIS — I63432 Cerebral infarction due to embolism of left posterior cerebral artery: Secondary | ICD-10-CM | POA: Diagnosis present

## 2018-05-16 DIAGNOSIS — I6389 Other cerebral infarction: Secondary | ICD-10-CM | POA: Diagnosis not present

## 2018-05-16 DIAGNOSIS — K219 Gastro-esophageal reflux disease without esophagitis: Secondary | ICD-10-CM

## 2018-05-16 DIAGNOSIS — R519 Headache, unspecified: Secondary | ICD-10-CM

## 2018-05-16 DIAGNOSIS — N184 Chronic kidney disease, stage 4 (severe): Secondary | ICD-10-CM | POA: Diagnosis present

## 2018-05-16 DIAGNOSIS — Z90710 Acquired absence of both cervix and uterus: Secondary | ICD-10-CM | POA: Diagnosis not present

## 2018-05-16 DIAGNOSIS — Z7982 Long term (current) use of aspirin: Secondary | ICD-10-CM

## 2018-05-16 DIAGNOSIS — R29703 NIHSS score 3: Secondary | ICD-10-CM | POA: Diagnosis present

## 2018-05-16 DIAGNOSIS — Z79899 Other long term (current) drug therapy: Secondary | ICD-10-CM | POA: Diagnosis not present

## 2018-05-16 DIAGNOSIS — E119 Type 2 diabetes mellitus without complications: Secondary | ICD-10-CM

## 2018-05-16 DIAGNOSIS — E785 Hyperlipidemia, unspecified: Secondary | ICD-10-CM | POA: Diagnosis present

## 2018-05-16 DIAGNOSIS — I4891 Unspecified atrial fibrillation: Secondary | ICD-10-CM | POA: Diagnosis present

## 2018-05-16 DIAGNOSIS — H53462 Homonymous bilateral field defects, left side: Secondary | ICD-10-CM | POA: Diagnosis present

## 2018-05-16 DIAGNOSIS — Z91199 Patient's noncompliance with other medical treatment and regimen due to unspecified reason: Secondary | ICD-10-CM

## 2018-05-16 DIAGNOSIS — E1122 Type 2 diabetes mellitus with diabetic chronic kidney disease: Secondary | ICD-10-CM | POA: Diagnosis present

## 2018-05-16 DIAGNOSIS — R29818 Other symptoms and signs involving the nervous system: Secondary | ICD-10-CM

## 2018-05-16 DIAGNOSIS — I639 Cerebral infarction, unspecified: Secondary | ICD-10-CM | POA: Diagnosis present

## 2018-05-16 DIAGNOSIS — Z20828 Contact with and (suspected) exposure to other viral communicable diseases: Secondary | ICD-10-CM | POA: Diagnosis not present

## 2018-05-16 DIAGNOSIS — R4182 Altered mental status, unspecified: Secondary | ICD-10-CM | POA: Diagnosis not present

## 2018-05-16 DIAGNOSIS — Z7984 Long term (current) use of oral hypoglycemic drugs: Secondary | ICD-10-CM | POA: Diagnosis not present

## 2018-05-16 DIAGNOSIS — E1159 Type 2 diabetes mellitus with other circulatory complications: Secondary | ICD-10-CM | POA: Diagnosis not present

## 2018-05-16 DIAGNOSIS — I361 Nonrheumatic tricuspid (valve) insufficiency: Secondary | ICD-10-CM | POA: Diagnosis not present

## 2018-05-16 DIAGNOSIS — I482 Chronic atrial fibrillation, unspecified: Secondary | ICD-10-CM | POA: Diagnosis not present

## 2018-05-16 LAB — I-STAT CHEM 8, ED
BUN: 30 mg/dL — ABNORMAL HIGH (ref 8–23)
Calcium, Ion: 1.22 mmol/L (ref 1.15–1.40)
Chloride: 109 mmol/L (ref 98–111)
Creatinine, Ser: 2 mg/dL — ABNORMAL HIGH (ref 0.44–1.00)
Glucose, Bld: 93 mg/dL (ref 70–99)
HCT: 36 % (ref 36.0–46.0)
Hemoglobin: 12.2 g/dL (ref 12.0–15.0)
Potassium: 3.3 mmol/L — ABNORMAL LOW (ref 3.5–5.1)
Sodium: 142 mmol/L (ref 135–145)
TCO2: 25 mmol/L (ref 22–32)

## 2018-05-16 LAB — DIFFERENTIAL
Abs Immature Granulocytes: 0.01 10*3/uL (ref 0.00–0.07)
Basophils Absolute: 0 10*3/uL (ref 0.0–0.1)
Basophils Relative: 0 %
Eosinophils Absolute: 0.1 10*3/uL (ref 0.0–0.5)
Eosinophils Relative: 2 %
Immature Granulocytes: 0 %
Lymphocytes Relative: 34 %
Lymphs Abs: 2 10*3/uL (ref 0.7–4.0)
Monocytes Absolute: 0.6 10*3/uL (ref 0.1–1.0)
Monocytes Relative: 10 %
Neutro Abs: 3.2 10*3/uL (ref 1.7–7.7)
Neutrophils Relative %: 54 %

## 2018-05-16 LAB — CBC
HCT: 36.5 % (ref 36.0–46.0)
Hemoglobin: 11.7 g/dL — ABNORMAL LOW (ref 12.0–15.0)
MCH: 31.8 pg (ref 26.0–34.0)
MCHC: 32.1 g/dL (ref 30.0–36.0)
MCV: 99.2 fL (ref 80.0–100.0)
Platelets: 212 10*3/uL (ref 150–400)
RBC: 3.68 MIL/uL — ABNORMAL LOW (ref 3.87–5.11)
RDW: 13.1 % (ref 11.5–15.5)
WBC: 5.9 10*3/uL (ref 4.0–10.5)
nRBC: 0 % (ref 0.0–0.2)

## 2018-05-16 LAB — URINE CULTURE
MICRO NUMBER:: 474780
SPECIMEN QUALITY:: ADEQUATE

## 2018-05-16 LAB — COMPREHENSIVE METABOLIC PANEL
ALT: 18 U/L (ref 0–44)
AST: 25 U/L (ref 15–41)
Albumin: 3.5 g/dL (ref 3.5–5.0)
Alkaline Phosphatase: 74 U/L (ref 38–126)
Anion gap: 11 (ref 5–15)
BUN: 31 mg/dL — ABNORMAL HIGH (ref 8–23)
CO2: 23 mmol/L (ref 22–32)
Calcium: 9.6 mg/dL (ref 8.9–10.3)
Chloride: 108 mmol/L (ref 98–111)
Creatinine, Ser: 1.93 mg/dL — ABNORMAL HIGH (ref 0.44–1.00)
GFR calc Af Amer: 27 mL/min — ABNORMAL LOW (ref >60)
GFR calc non Af Amer: 24 mL/min — ABNORMAL LOW (ref >60)
Glucose, Bld: 91 mg/dL (ref 70–99)
Potassium: 3.4 mmol/L — ABNORMAL LOW (ref 3.5–5.1)
Sodium: 142 mmol/L (ref 135–145)
Total Bilirubin: 0.5 mg/dL (ref 0.3–1.2)
Total Protein: 7.2 g/dL (ref 6.5–8.1)

## 2018-05-16 LAB — SARS CORONAVIRUS 2 BY RT PCR (HOSPITAL ORDER, PERFORMED IN ~~LOC~~ HOSPITAL LAB): SARS Coronavirus 2: NEGATIVE

## 2018-05-16 LAB — APTT: aPTT: 27 seconds (ref 24–36)

## 2018-05-16 LAB — CBG MONITORING, ED: Glucose-Capillary: 86 mg/dL (ref 70–99)

## 2018-05-16 LAB — PROTIME-INR
INR: 1.1 (ref 0.8–1.2)
Prothrombin Time: 13.9 seconds (ref 11.4–15.2)

## 2018-05-16 LAB — GLUCOSE, CAPILLARY: Glucose-Capillary: 96 mg/dL (ref 70–99)

## 2018-05-16 MED ORDER — INSULIN ASPART 100 UNIT/ML ~~LOC~~ SOLN
0.0000 [IU] | Freq: Three times a day (TID) | SUBCUTANEOUS | Status: DC
  Administered 2018-05-18: 1 [IU] via SUBCUTANEOUS

## 2018-05-16 MED ORDER — ACETAMINOPHEN 325 MG PO TABS
650.0000 mg | ORAL_TABLET | Freq: Once | ORAL | Status: AC
  Administered 2018-05-16: 21:00:00 650 mg via ORAL
  Filled 2018-05-16: qty 2

## 2018-05-16 MED ORDER — ATORVASTATIN CALCIUM 80 MG PO TABS
80.0000 mg | ORAL_TABLET | Freq: Every day | ORAL | Status: DC
  Administered 2018-05-17 – 2018-05-19 (×3): 80 mg via ORAL
  Filled 2018-05-16 (×4): qty 1

## 2018-05-16 MED ORDER — SODIUM CHLORIDE 0.9 % IV SOLN
INTRAVENOUS | Status: DC
  Administered 2018-05-16 – 2018-05-18 (×4): via INTRAVENOUS

## 2018-05-16 MED ORDER — ASPIRIN EC 81 MG PO TBEC: 81.0000 mg | DELAYED_RELEASE_TABLET | Freq: Every day | ORAL | Status: DC

## 2018-05-16 MED ORDER — TIMOLOL MALEATE 0.5 % OP SOLN
1.0000 [drp] | Freq: Every day | OPHTHALMIC | Status: DC
  Administered 2018-05-17 – 2018-05-19 (×3): 1 [drp] via OPHTHALMIC
  Filled 2018-05-16: qty 5

## 2018-05-16 MED ORDER — LATANOPROST 0.005 % OP SOLN
1.0000 [drp] | Freq: Every day | OPHTHALMIC | Status: DC
  Administered 2018-05-17 – 2018-05-18 (×3): 1 [drp] via OPHTHALMIC
  Filled 2018-05-16: qty 2.5

## 2018-05-16 MED ORDER — SODIUM CHLORIDE 0.9% FLUSH: 3.0000 mL | Freq: Once | INTRAVENOUS | Status: DC

## 2018-05-16 MED ORDER — ATORVASTATIN CALCIUM 40 MG PO TABS: 40.0000 mg | ORAL_TABLET | Freq: Every day | ORAL | Status: DC

## 2018-05-16 MED ORDER — SODIUM CHLORIDE 0.9 % IV SOLN
Freq: Once | INTRAVENOUS | Status: AC
  Administered 2018-05-17: 01:00:00 via INTRAVENOUS

## 2018-05-16 MED ORDER — INSULIN ASPART 100 UNIT/ML ~~LOC~~ SOLN: 0.0000 [IU] | Freq: Every day | SUBCUTANEOUS | Status: DC

## 2018-05-16 NOTE — ED Notes (Signed)
Tech collecting labs.

## 2018-05-16 NOTE — ED Provider Notes (Signed)
[facility] EMERGENCY DEPARTMENT Provider Note   CSN: [id number] Arrival date & time: 05/16/18  1816    History   Chief Complaint Chief Complaint  Patient presents with   Altered Mental Status    HPI Kimberly Castillo is a 85 y.o. female.     The history is provided by the patient, medical records and a relative. No language interpreter was used.  Altered Mental Status   Kimberly Castillo is a 85 y.o. female who presents to the Emergency Department complaining of AMS. She presents to the emergency department accompanied by her son for evaluation of confusion and abnormal MRI. He states that on Saturday she seemed confused it was not behaving as herself. She has been sleeping more since that time. She did transiently improve Monday only to worsened since then. She saw her PCP is an outpatient and an outpatient MRI was obtained. They referred to the emergency department due to MRI findings concerning for acute CVA. No reports of recent fevers, cough, shortness of breath, nausea, vomiting. She lives with her son. Symptoms are severe and constant nature. Past Medical History:  Diagnosis Date   A-fib [facility])    Anxiety state 12/22/2013   Arthritis    hands, back   Arthritis, lumbar spine 12/19/2012   Cataracts, bilateral    Chronic lower back pain 12/19/2012   Diabetes mellitus ([place])    Diverticulitis    Dysrhythmia    GERD (gastroesophageal reflux disease)    Glaucoma    Headache    hx migraines   Heart murmur    History of shingles    Hyperlipidemia    Hypertension    Numbness    rt side of rt leg   Post herpetic neuralgia 12/19/2012    Patient Active Problem List   Diagnosis Date Noted   Memory changes 05/13/2018   Right knee pain 02/12/2017   Spondylolisthesis, lumbar region 03/02/2016   CKD (chronic kidney disease), stage III ([place]) 02/16/2016   Chronic low back pain 07/12/2015   Bilateral foot pain 01/26/2015   Colostomy status  ([place]) 11/09/2014   Rash 06/24/2014   Preventative health care 12/31/2013   Anxiety state 12/22/2013   Yeast vaginitis 12/22/2013   Diverticulitis of colon with perforation s/p colectomy/ostomy 09/02/2013 09/02/2013   Post herpetic neuralgia 12/19/2012   Arthritis, lumbar spine 12/19/2012   Glaucoma    A-fib ([place])    GERD (gastroesophageal reflux disease) 10/06/2012   DM type 2 (diabetes mellitus, type 2) ([place]) 09/22/2012   Hypertension 09/22/2012   Hyperlipemia 09/22/2012    Past Surgical History:  Procedure Laterality Date   ABDOMINAL HYSTERECTOMY  1964   partial   COLON RESECTION N/A 09/02/2013   Procedure: [place] colectomy;  Surgeon: Excell Seltzer, MD;  Location: [facility];  Service: General;  Laterality: N/A;   COLONOSCOPY     COLOSTOMY TAKEDOWN N/A 11/09/2014   Procedure: TAKEDOWN [place] COLOSTOMY;  Surgeon: Excell Seltzer, MD;  Location: [facility];  Service: General;  Laterality: N/A;   INCISION AND DRAINAGE BREAST ABSCESS  1958   left   POLYPECTOMY       OB History   No obstetric history on file.      Home Medications    Prior to Admission medications   Medication Sig Start Date End Date Taking? Authorizing Provider  aspirin EC 81 MG tablet Take 81 mg by mouth daily.    [provider]  Blood Glucose Monitoring Suppl (ONE TOUCH ULTRA 2) w/Device  KIT USE as directed 05/13/18   Biagio Borg, MD  diltiazem (CARDIZEM) 120 MG tablet Take 1 tablet (120 mg total) by mouth daily. For heart and blood pressure 05/13/18   Biagio Borg, MD  glucose blood (ONE TOUCH ULTRA TEST) test strip Use as instructed daily E11.9 05/14/18   Biagio Borg, MD  Lancets MISC Use as directed once daily 05/13/18   Biagio Borg, MD  latanoprost (XALATAN) 0.005 % ophthalmic solution Place 1 drop into both eyes at bedtime.    [provider]  metFORMIN (GLUCOPHAGE) 500 MG tablet Take 1 tablet (500 mg total) by mouth 2 (two) times daily with a meal. For diabetes  05/13/18   Biagio Borg, MD  pantoprazole (PROTONIX) 40 MG tablet Take 1 tablet (40 mg total) by mouth daily. For reflux 05/13/18   Biagio Borg, MD  simvastatin (ZOCOR) 40 MG tablet Take 1 tablet (40 mg total) by mouth at bedtime. For high cholesterol 05/13/18   Biagio Borg, MD  timolol (BETIMOL) 0.5 % ophthalmic solution Place 1 drop into both eyes daily.    [provider]    Family History Family History  Problem Relation Age of Onset   Hypertension Mother    Hypertension Maternal Grandmother    Colon cancer Neg Hx    Stomach cancer Neg Hx    Colon polyps Neg Hx    Esophageal cancer Neg Hx    Rectal cancer Neg Hx     Social History Social History   Tobacco Use   Smoking status: Former Smoker    Types: Cigarettes    Start date: 09/17/1991    Last attempt to quit: 10/31/1997    Years since quitting: 20.5   Smokeless tobacco: Never Used  Substance Use Topics   Alcohol use: No   Drug use: No     Allergies   Patient has no known allergies.   Review of Systems Review of Systems  All other systems reviewed and are negative.    Physical Exam Updated Vital Signs BP (!) 186/75    Pulse 62    Temp 98.4 F (36.9 C) (Oral)    Resp 20    Ht 5' 2"  (1.575 m)    Wt 61.2 kg    SpO2 97%    BMI 24.69 kg/m   Physical Exam Vitals signs and nursing note reviewed.  Constitutional:      Appearance: She is well-developed.  HENT:     Head: Normocephalic and atraumatic.  Cardiovascular:     Rate and Rhythm: Normal rate and regular rhythm.     Heart sounds: No murmur.  Pulmonary:     Effort: Pulmonary effort is normal. No respiratory distress.     Breath sounds: Normal breath sounds.  Abdominal:     Palpations: Abdomen is soft.     Tenderness: There is no abdominal tenderness. There is no guarding or rebound.  Musculoskeletal:        General: No swelling or tenderness.  Skin:    General: Skin is warm and dry.  Neurological:     Mental Status: She is  alert.     Comments: Mildly confused. Disoriented to time. Difficulty with naming objects. Slow to answer questions. Five out of five strength in all four extremities with sensation to light touch intact in all four extremities. Inconsistent visual field testing.  Psychiatric:        Behavior: Behavior normal.      ED Treatments /  Results  Labs (all labs ordered are listed, but only abnormal results are displayed) Labs Reviewed  CBC - Abnormal; Notable for the following components:      Result Value   RBC 3.68 (*)    Hemoglobin 11.7 (*)    All other components within normal limits  COMPREHENSIVE METABOLIC PANEL - Abnormal; Notable for the following components:   Potassium 3.4 (*)    BUN 31 (*)    Creatinine, Ser 1.93 (*)    GFR calc non Af [name] 24 (*)    GFR calc Af [name] 27 (*)    All other components within normal limits  I-STAT CHEM 8, ED - Abnormal; Notable for the following components:   Potassium 3.3 (*)    BUN 30 (*)    Creatinine, Ser 2.00 (*)    All other components within normal limits  PROTIME-INR  APTT  DIFFERENTIAL  CBG MONITORING, ED    EKG EKG Interpretation  Date/Time:  Friday May 16 2018 19:44:34 EDT Ventricular Rate:  70 PR Interval:    QRS Duration: 91 QT Interval:  411 QTC Calculation: 444 R Axis:   -49 Text Interpretation:  Sinus rhythm Ventricular premature complex Left anterior fascicular block LVH with secondary repolarization abnormality Anterior infarct, old Confirmed by Quintella Reichert [phone]) on 05/16/2018 8:01:25 PM   Radiology Dg Chest 2 View  Result Date: 05/16/2018 CLINICAL DATA:  85 year old female with a history of altered mental status EXAM: CHEST - 2 VIEW COMPARISON:  10/12/2014, 09/01/2013 FINDINGS: Cardiomediastinal silhouette unchanged in size and contour. No pneumothorax or pleural effusion. No confluent airspace disease. Coarsened interstitial markings. Calcifications of the aortic arch. No displaced fracture.  Degenerative  changes of the spine IMPRESSION: Negative for acute cardiopulmonary disease Electronically Signed   By: Corrie Mckusick D.O.   On: 05/16/2018 08:03   Mr Brain Wo Contrast  Result Date: 05/16/2018 CLINICAL DATA:  Initial evaluation for acute confusion, memory loss for 1 week. EXAM: MRI HEAD WITHOUT CONTRAST TECHNIQUE: Multiplanar, multiecho pulse sequences of the brain and surrounding structures were obtained without intravenous contrast. COMPARISON:  None available. FINDINGS: Brain: Cerebral volume within normal limits for age. Mild patchy T2/FLAIR hyperintensity within the periventricular and deep white matter both cerebral hemispheres most consistent with chronic small vessel ischemic disease, mild in nature. Large confluent area of restricted diffusion involving the parasagittal left occipital lobe compatible with acute to early subacute left PCA territory infarct. Mild patchy involvement of left thalamus. No associated hemorrhage. Mild localized mass effect without midline shift. No other evidence for acute or subacute ischemia. Gray-white matter differentiation otherwise maintained. No other areas of chronic cortical infarction. No acute or chronic intracranial hemorrhage. No mass lesion, midline shift or mass effect. No hydrocephalus. No extra-axial fluid collection. Normal pituitary gland. Vascular: Major intracranial vascular flow voids are maintained. Skull and upper cervical spine: Craniocervical junction within normal limits. Visualized upper cervical spine normal. Bone marrow signal intensity within normal limits. No scalp soft tissue abnormality. Sinuses/Orbits: Globes and orbital soft tissues within normal limits. Moderate mucosal thickening noted within the right maxillary sinus, likely chronic. Paranasal sinuses are otherwise largely clear. No mastoid effusion. Inner ear structures normal. Other: None. IMPRESSION: 1. Large acute to early subacute left PCA territory infarct. No associated hemorrhage  or significant regional mass effect. 2. Underlying mild for age chronic microvascular ischemic disease. Results were called by telephone at the time of interpretation on 05/16/2018 at 7:14 pm to the physician assistant in the emergency room, [facility],  who verbally acknowledged these results. Electronically Signed   By: Jeannine Boga M.D.   On: 05/16/2018 19:15    Procedures Procedures (including critical care time)  Medications Ordered in ED Medications  sodium chloride flush (NS) 0.9 % injection 3 mL (has no administration in time range)  acetaminophen (TYLENOL) tablet 650 mg (has no administration in time range)     Initial Impression / Assessment and Plan / ED Course  I have reviewed the triage vital signs and the nursing notes.  Pertinent labs & imaging results that were available during my care of the patient were reviewed by me and considered in my medical decision making (see chart for details).        Patient here for evaluation CVA diagnosed on outpatient MRI. Discussed the patient with Dr. Lorraine Lax, who will see the patient and consult. Medicine consulted for admission. Patient and son updated findings of studies and recommendation for admission and they are in agreement with treatment plan.  Final Clinical Impressions(s) / ED Diagnoses   Final diagnoses:  Acute CVA (cerebrovascular accident) [facility])    ED Discharge Orders    None       Quintella Reichert, MD 05/16/18 2018

## 2018-05-16 NOTE — Telephone Encounter (Signed)
Copied from [place] [id number]. Topic: Quick Communication - Rx Refill/Question >> May 16, 2018  9:46 AM Scherrie Gerlach wrote: Medication: diltiazem (CARDIZEM) 120 MG tablet Interaction w/ simvastatin (ZOCOR) 40 MG tablet  Pharmacy called to advise there is an interaction with these 2 meds, the diltiazem can raise the levels of the simvastatin and cause increased muscle ache. Pharmacy needs to know before they fil if dr ok with these together.

## 2018-05-16 NOTE — Telephone Encounter (Signed)
Pharmacy informed.

## 2018-05-16 NOTE — ED Triage Notes (Signed)
Pt reports coming from [facility], the nurse explained to the pt my have had a stroke.  Son reports that the pt had altered mental status around last Saturday evening 5/9. Did not know where she was.

## 2018-05-16 NOTE — ED Notes (Signed)
ED TO INPATIENT HANDOFF REPORT  ED Nurse Name and Phone #:  [place] Fam [phone]  S Name/Age/Gender Kimberly Castillo 85 y.o. female Room/Bed: 032C/032C  Code Status   Code Status: Prior  Home/SNF/Other Home Patient oriented to: self Is this baseline? No   Triage Complete: Triage complete  Chief Complaint confusion/vision changes   Triage Note Pt reports coming from [facility], the nurse explained to the pt my have had a stroke.  Son reports that the pt had altered mental status around last Saturday evening 5/9. Did not know where she was.    Allergies No Known Allergies  Level of Care/Admitting Diagnosis ED Disposition    ED Disposition Condition [facility]: [place] [[id number]]  Level of Care: Progressive [102]  I expect the patient will be discharged within 24 hours: Yes  LOW acuity---Tx typically complete <24 hrs---ACUTE conditions typically can be evaluated <24 hours---LABS likely to return to acceptable levels <24 hours---IS near functional baseline---EXPECTED to return to current living arrangement---NOT newly hypoxic: Meets criteria for 5C-Observation unit  Covid Evaluation: Screening Protocol (No Symptoms)  Diagnosis: CVA (cerebral vascular accident) [facility]) [321224]  Admitting Physician: Bernadette Hoit [[id number]]  Attending Physician: Bernadette Hoit [[id number]]  PT Class (Do Not Modify): Observation [104]  PT Acc Code (Do Not Modify): Observation [10022]       B Medical/Surgery History Past Medical History:  Diagnosis Date  . A-fib ([place])   . Anxiety state 12/22/2013  . Arthritis    hands, back  . Arthritis, lumbar spine 12/19/2012  . Cataracts, bilateral   . Chronic lower back pain 12/19/2012  . Diabetes mellitus ([place])   . Diverticulitis   . Dysrhythmia   . GERD (gastroesophageal reflux disease)   . Glaucoma   . Headache    hx migraines  . Heart murmur   . History of shingles   . Hyperlipidemia   .  Hypertension   . Numbness    rt side of rt leg  . Post herpetic neuralgia 12/19/2012   Past Surgical History:  Procedure Laterality Date  . ABDOMINAL HYSTERECTOMY  1964   partial  . COLON RESECTION N/A 09/02/2013   Procedure: [place] colectomy;  Surgeon: Excell Seltzer, MD;  Location: [facility];  Service: General;  Laterality: N/A;  . COLONOSCOPY    . COLOSTOMY TAKEDOWN N/A 11/09/2014   Procedure: TAKEDOWN [place] COLOSTOMY;  Surgeon: Excell Seltzer, MD;  Location: [facility];  Service: General;  Laterality: N/A;  . [place]   left  . POLYPECTOMY       A IV Location/Drains/Wounds Patient Lines/Drains/Airways Status   Active Line/Drains/Airways    Name:   Placement date:   Placement time:   Site:   Days:   Incision (Closed) 11/09/14 Abdomen Other (Comment)   11/09/14    1824     1284   Incision (Closed) 11/09/14 Abdomen Left   11/09/14    1824     1284          Intake/Output Last 24 hours No intake or output data in the 24 hours ending 05/16/18 2115  Labs/Imaging Results for orders placed or performed during the hospital encounter of 05/16/18 (from the past 48 hour(s))  Protime-INR     Status: None   Collection Time: 05/16/18  7:19 PM  Result Value Ref Range   Prothrombin Time 13.9 11.4 - 15.2 seconds   INR 1.1 0.8 - 1.2  Comment: (NOTE) INR goal varies based on device and disease states. Performed at [facility], [place] [address] [place], [place] [address]   APTT     Status: None   Collection Time: 05/16/18  7:19 PM  Result Value Ref Range   aPTT 27 24 - 36 seconds    Comment: Performed at [facility] [address], [place], [place] [address]  CBC     Status: Abnormal   Collection Time: 05/16/18  7:19 PM  Result Value Ref Range   WBC 5.9 4.0 - 10.5 K/uL   RBC 3.68 (L) 3.87 - 5.11 MIL/uL   Hemoglobin 11.7 (L) 12.0 - 15.0 g/dL   HCT 36.5 36.0 - 46.0 %   MCV 99.2 80.0 - 100.0 fL   MCH 31.8 26.0 - 34.0 pg   MCHC  32.1 30.0 - 36.0 g/dL   RDW 13.1 11.5 - 15.5 %   Platelets 212 150 - 400 K/uL   nRBC 0.0 0.0 - 0.2 %    Comment: Performed at [facility], [place] [address], [place], [place] [address]  Differential     Status: None   Collection Time: 05/16/18  7:19 PM  Result Value Ref Range   Neutrophils Relative % 54 %   Neutro Abs 3.2 1.7 - 7.7 K/uL   Lymphocytes Relative 34 %   Lymphs Abs 2.0 0.7 - 4.0 K/uL   Monocytes Relative 10 %   Monocytes Absolute 0.6 0.1 - 1.0 K/uL   Eosinophils Relative 2 %   Eosinophils Absolute 0.1 0.0 - 0.5 K/uL   Basophils Relative 0 %   Basophils Absolute 0.0 0.0 - 0.1 K/uL   Immature Granulocytes 0 %   Abs Immature Granulocytes 0.01 0.00 - 0.07 K/uL    Comment: Performed at [facility], [place] [address], [place], [place] [address]  Comprehensive metabolic panel     Status: Abnormal   Collection Time: 05/16/18  7:19 PM  Result Value Ref Range   Sodium 142 135 - 145 mmol/L   Potassium 3.4 (L) 3.5 - 5.1 mmol/L   Chloride 108 98 - 111 mmol/L   CO2 23 22 - 32 mmol/L   Glucose, Bld 91 70 - 99 mg/dL   BUN 31 (H) 8 - 23 mg/dL   Creatinine, Ser 1.93 (H) 0.44 - 1.00 mg/dL   Calcium 9.6 8.9 - 10.3 mg/dL   Total Protein 7.2 6.5 - 8.1 g/dL   Albumin 3.5 3.5 - 5.0 g/dL   AST 25 15 - 41 U/L   ALT 18 0 - 44 U/L   Alkaline Phosphatase 74 38 - 126 U/L   Total Bilirubin 0.5 0.3 - 1.2 mg/dL   GFR calc non Af [name] 24 (L) >60 mL/min   GFR calc Af [name] 27 (L) >60 mL/min   Anion gap 11 5 - 15    Comment: Performed at [facility] [address], [place], [place] [address]  I-stat chem 8, ED [facility] and [facility] only)     Status: Abnormal   Collection Time: 05/16/18  7:28 PM  Result Value Ref Range   Sodium 142 135 - 145 mmol/L   Potassium 3.3 (L) 3.5 - 5.1 mmol/L   Chloride 109 98 - 111 mmol/L   BUN 30 (H) 8 - 23 mg/dL   Creatinine, Ser 2.00 (H) 0.44 - 1.00 mg/dL   Glucose, Bld 93 70 - 99 mg/dL   Calcium, Ion 1.22 1.15 - 1.40  mmol/L   TCO2 25 22 - 32 mmol/L    Hemoglobin 12.2 12.0 - 15.0 g/dL   HCT 36.0 36.0 - 46.0 %  CBG monitoring, ED     Status: None   Collection Time: 05/16/18  8:02 PM  Result Value Ref Range   Glucose-Capillary 86 70 - 99 mg/dL   Dg Chest 2 View  Result Date: 05/16/2018 CLINICAL DATA:  84 year old female with a history of altered mental status EXAM: CHEST - 2 VIEW COMPARISON:  10/12/2014, 09/01/2013 FINDINGS: Cardiomediastinal silhouette unchanged in size and contour. No pneumothorax or pleural effusion. No confluent airspace disease. Coarsened interstitial markings. Calcifications of the aortic arch. No displaced fracture.  Degenerative changes of the spine IMPRESSION: Negative for acute cardiopulmonary disease Electronically Signed   By: Corrie Mckusick D.O.   On: 05/16/2018 08:03   Mr Brain Wo Contrast  Result Date: 05/16/2018 CLINICAL DATA:  Initial evaluation for acute confusion, memory loss for 1 week. EXAM: MRI HEAD WITHOUT CONTRAST TECHNIQUE: Multiplanar, multiecho pulse sequences of the brain and surrounding structures were obtained without intravenous contrast. COMPARISON:  None available. FINDINGS: Brain: Cerebral volume within normal limits for age. Mild patchy T2/FLAIR hyperintensity within the periventricular and deep white matter both cerebral hemispheres most consistent with chronic small vessel ischemic disease, mild in nature. Large confluent area of restricted diffusion involving the parasagittal left occipital lobe compatible with acute to early subacute left PCA territory infarct. Mild patchy involvement of left thalamus. No associated hemorrhage. Mild localized mass effect without midline shift. No other evidence for acute or subacute ischemia. Gray-white matter differentiation otherwise maintained. No other areas of chronic cortical infarction. No acute or chronic intracranial hemorrhage. No mass lesion, midline shift or mass effect. No hydrocephalus. No extra-axial fluid collection. Normal pituitary gland. Vascular:  Major intracranial vascular flow voids are maintained. Skull and upper cervical spine: Craniocervical junction within normal limits. Visualized upper cervical spine normal. Bone marrow signal intensity within normal limits. No scalp soft tissue abnormality. Sinuses/Orbits: Globes and orbital soft tissues within normal limits. Moderate mucosal thickening noted within the right maxillary sinus, likely chronic. Paranasal sinuses are otherwise largely clear. No mastoid effusion. Inner ear structures normal. Other: None. IMPRESSION: 1. Large acute to early subacute left PCA territory infarct. No associated hemorrhage or significant regional mass effect. 2. Underlying mild for age chronic microvascular ischemic disease. Results were called by telephone at the time of interpretation on 05/16/2018 at 7:14 pm to the physician assistant in the emergency room, [facility], who verbally acknowledged these results. Electronically Signed   By: Jeannine Boga M.D.   On: 05/16/2018 19:15    Pending Labs Unresulted Labs (From admission, onward)    Start     Ordered   05/16/18 2021  SARS Coronavirus 2 (CEPHEID - Performed in [place] hospital lab), Hosp Order  (Asymptomatic Patients Labs)  Once,   R    Question:  Rule Out  Answer:  Yes   05/16/18 2020          Vitals/Pain Today's Vitals   05/16/18 1945 05/16/18 1950 05/16/18 2030 05/16/18 2044  BP: (!) 186/75  (!) 190/91   Pulse: 62  62   Resp: 20  16   Temp:      TempSrc:      SpO2: 97%  98%   Weight:      Height:      PainSc:  0-No pain  7     Isolation Precautions No active isolations  Medications Medications  sodium chloride flush (NS) 0.9 % injection 3 mL (has no administration in time range)  acetaminophen (TYLENOL) tablet 650 mg (650 mg Oral Given 05/16/18 2056)    Mobility walks Moderate fall risk   Focused Assessments Neuro Assessment Handoff:  Swallow screen pass? Yes    NIH Stroke Scale ( + Modified Stroke Scale Criteria)   Interval: Initial Level of Consciousness (1a.)   : Alert, keenly responsive LOC Questions (1b. )   +: Answers neither question correctly LOC Commands (1c. )   + : Performs both tasks correctly Best Gaze (2. )  +: Normal Visual (3. )  +: No visual loss Facial Palsy (4. )    : Normal symmetrical movements Motor Arm, Left (5a. )   +: No drift Motor Arm, Right (5b. )   +: No drift Motor Leg, Left (6a. )   +: No drift Motor Leg, Right (6b. )   +: No drift Limb Ataxia (7. ): Absent Sensory (8. )   +: Normal, no sensory loss Best Language (9. )   +: No aphasia Dysarthria (10. ): Normal Extinction/Inattention (11.)   +: No Abnormality Modified SS Total  +: 2 Complete NIHSS TOTAL: 2 Last date known well: 05/10/18   Neuro Assessment: Exceptions to WDL Neuro Checks:   Initial (05/16/18 1946)  Last Documented NIHSS Modified Score: 2 (05/16/18 1946) Has TPA been given? No If patient is a Neuro Trauma and patient is going to OR before floor call report to [place] nurse: [phone] or [phone]     R Recommendations: See Admitting Provider Note  Report given to:   Additional Notes:  COVID test has been collected

## 2018-05-16 NOTE — H&P (Signed)
History and Physical    Kimberly Castillo GYJ:[id number] DOB: Jun 21, 1933 DOA: 05/16/2018  Referring MD/NP/PA: Quintella Reichert PCP: Biagio Borg, MD  Patient coming from: Home  Chief Complaint: Altered mental status  HPI: Kimberly Castillo is an 85 y.o. female with medical history significant of atrial fibrillation, type 2 diabetes mellitus, GERD, and hyperlipidemia is to the emergency department due to 6-day onset of altered mental status. History cannot be obtained from patient due to recent changes in memory.  History was obtained from son at bedside, per son, patient was noted to present with change in mental status on Saturday(05/10/2018) in which case she was confused and was not behaving as herself, on following day (Sunday, 05/11/2018), patient slept for the most part of the day which was unusual in patient.  She was seen by her PCP (Dr. Cathlean Cower) on Tuesday (05/13/2018) during week chest x-ray and some lab work were ordered. Head MRI to rule out CVA ordered was done today and showed large acute to early subacute left PCA territory infarct and patient was asked to go to the ED for further evaluation and management.  She denies slurred speech or any weakness in the extremities.  However she complained of worsening memory left parietal area headache.  Patient denies nausea, vomiting, chest pain, shortness of breath or abdominal pain.   ED Course:  In the emergency department, BP was elevated at 107/85 and other vital signs are within normal range.  Work-up in the ED showed hypokalemia and elevated BUN/creatinine.  A1c was checked and was 6.4, cholesterol 211, triglycerides 188, LDL around 24.  Vitamin D was 15.51. SARS coronavirus 2 was negative. Neurologist (Dr.Aroor) was consulted by ED physician and will see patient as a consult after being admitted by medicine team per ED physician and ED chart.  Hospitalist was asked to admit patient for further evaluation and management.  Review of Systems:    Constitutional: Denies any acute distress or dizziness. HENT: Negative for ear discharge or swelling Eyes: Worsening visual disturbances, negative for eye pain Respiratory: Denies shortness of breath or increased work of breathing Cardiovascular: Denies chest pain, palpitations or diaphoresis Gastrointestinal: Denies nausea, vomiting or abdominal pain Genitourinary: Denies urinary retention or blood in urine Musculoskeletal: Negative for other MSK pain or swelling Skin: Negative for color change or other new lesions Neurological: Complained of worsening memory and mild headache.  Psychiatric/Behavioral: Negative for worsening agitation or other fatigue   Past Medical History:  Diagnosis Date  . A-fib ([place])   . Anxiety state 12/22/2013  . Arthritis    hands, back  . Arthritis, lumbar spine 12/19/2012  . Cataracts, bilateral   . Chronic lower back pain 12/19/2012  . Diabetes mellitus ([place])   . Diverticulitis   . Dysrhythmia   . GERD (gastroesophageal reflux disease)   . Glaucoma   . Headache    hx migraines  . Heart murmur   . History of shingles   . Hyperlipidemia   . Hypertension   . Numbness    rt side of rt leg  . Post herpetic neuralgia 12/19/2012    Past Surgical History:  Procedure Laterality Date  . ABDOMINAL HYSTERECTOMY  1964   partial  . COLON RESECTION N/A 09/02/2013   Procedure: [place] colectomy;  Surgeon: Excell Seltzer, MD;  Location: [facility];  Service: General;  Laterality: N/A;  . COLONOSCOPY    . COLOSTOMY TAKEDOWN N/A 11/09/2014   Procedure: TAKEDOWN [place] COLOSTOMY;  Surgeon: Excell Seltzer, MD;  Location: [facility];  Service: General;  Laterality: N/A;  . INCISION AND DRAINAGE BREAST ABSCESS  1958   left  . POLYPECTOMY       reports that she quit smoking about 20 years ago. Her smoking use included cigarettes. She started smoking about 26 years ago. She has never used smokeless tobacco. She reports that she does not drink alcohol or use drugs.   No Known Allergies  Family History  Problem Relation Age of Onset  . Hypertension Mother   . Hypertension Maternal Grandmother   . Colon cancer Neg Hx   . Stomach cancer Neg Hx   . Colon polyps Neg Hx   . Esophageal cancer Neg Hx   . Rectal cancer Neg Hx      Prior to Admission medications   Medication Sig Start Date End Date Taking? Authorizing Provider  diltiazem (CARDIZEM) 120 MG tablet Take 1 tablet (120 mg total) by mouth daily. For heart and blood pressure 05/13/18  Yes Biagio Borg, MD  aspirin EC 81 MG tablet Take 81 mg by mouth daily.    [provider]  Blood Glucose Monitoring Suppl (ONE TOUCH ULTRA 2) w/Device KIT USE as directed 05/13/18   Biagio Borg, MD  glucose blood (ONE TOUCH ULTRA TEST) test strip Use as instructed daily E11.9 05/14/18   Biagio Borg, MD  Lancets MISC Use as directed once daily 05/13/18   Biagio Borg, MD  latanoprost (XALATAN) 0.005 % ophthalmic solution Place 1 drop into both eyes at bedtime.    [provider]  metFORMIN (GLUCOPHAGE) 500 MG tablet Take 1 tablet (500 mg total) by mouth 2 (two) times daily with a meal. For diabetes 05/13/18   Biagio Borg, MD  pantoprazole (PROTONIX) 40 MG tablet Take 1 tablet (40 mg total) by mouth daily. For reflux 05/13/18   Biagio Borg, MD  simvastatin (ZOCOR) 40 MG tablet Take 1 tablet (40 mg total) by mouth at bedtime. For high cholesterol 05/13/18   Biagio Borg, MD  timolol (BETIMOL) 0.5 % ophthalmic solution Place 1 drop into both eyes daily.    [provider]    Physical Exam: Vitals:   05/16/18 2030 05/16/18 2045 05/16/18 2115 05/16/18 2221  BP: (!) 190/91 (!) 186/98 (!) 188/91 (!) 171/89  Pulse: 62 69 65 75  Resp: _0 Temp:    98.4 F (36.9 C)  TempSrc:    Oral  SpO2: 98% 100% 100% 99%  Weight:      Height:          Constitutional: NAD, calm, comfortable Vitals:   05/16/18 2030 05/16/18 2045 05/16/18 2115 05/16/18 2221  BP: (!) 190/91 (!) 186/98  (!) 188/91 (!) 171/89  Pulse: 62 69 65 75  Resp: _1 Temp:    98.4 F (36.9 C)  TempSrc:    Oral  SpO2: 98% 100% 100% 99%  Weight:      Height:       Eyes: PERRL, lids and conjunctivae normal ENMT: Mucous membranes are moist. Posterior pharynx clear of any exudate or lesions.Normal dentition.  Neck: normal, supple, no masses, no thyromegaly Respiratory: clear to auscultation bilaterally, no wheezing, no crackles. Normal respiratory effort. No accessory muscle use.  Cardiovascular: Regular rate and rhythm, no murmurs / rubs / gallops. No extremity edema. 2+ pedal pulses. No carotid bruits.  Abdomen: no tenderness, no masses palpated. No hepatosplenomegaly. Bowel sounds positive.  Musculoskeletal: no clubbing /  cyanosis. No joint deformity upper and lower extremities. Good ROM, no contractures. Normal muscle tone.  Skin: no rashes, lesions, ulcers. No induration Neurologic: CN 2-12 grossly intact. Sensation intact, DTR normal. Strength 5/5 in all 4.  Psychiatric: Normal judgment and insight. Alert and oriented x 3. Normal mood.   Labs on Admission: I have personally reviewed following labs and imaging studies  CBC: Recent Labs  Lab 05/15/18 1328 05/16/18 1919 05/16/18 1928  WBC 5.9 5.9  --   NEUTROABS 3.5 3.2  --   HGB 12.1 11.7* 12.2  HCT 36.1 36.5 36.0  MCV 97.6 99.2  --   PLT 201.0 212  --    Basic Metabolic Panel: Recent Labs  Lab 05/15/18 1328 05/16/18 1919 05/16/18 1928  NA 141 142 142  K 3.7 3.4* 3.3*  CL 106 108 109  CO2 26 23  --   GLUCOSE 190* 91 93  BUN 32* 31* 30*  CREATININE 2.09* 1.93* 2.00*  CALCIUM 9.2 9.6  --   MG 1.8  --   --    GFR: Estimated Creatinine Clearance: 18.3 mL/min (A) (by C-G formula based on SCr of 2 mg/dL (H)). Liver Function Tests: Recent Labs  Lab 05/15/18 1328 05/16/18 1919  AST 18 25  ALT 12 18  ALKPHOS 66 74  BILITOT 0.6 0.5  PROT 6.8 7.2  ALBUMIN 3.4* 3.5   No results for input(s): LIPASE, AMYLASE in the  last 168 hours. No results for input(s): AMMONIA in the last 168 hours. Coagulation Profile: Recent Labs  Lab 05/16/18 1919  INR 1.1   Cardiac Enzymes: No results for input(s): CKTOTAL, CKMB, CKMBINDEX, TROPONINI in the last 168 hours. BNP (last 3 results) No results for input(s): PROBNP in the last 8760 hours. HbA1C: Recent Labs    05/15/18 1328  HGBA1C 6.4   CBG: Recent Labs  Lab 05/16/18 2002 05/16/18 2316  GLUCAP 86 96   Lipid Profile: Recent Labs    05/15/18 1328  CHOL 211*  HDL 47.50  LDLCALC 124*  TRIG 198.0*  CHOLHDL 4   Thyroid Function Tests: Recent Labs    05/15/18 1328  TSH 1.06   Anemia Panel: Recent Labs    05/15/18 1328  VITAMINB12 363   Urine analysis:    Component Value Date/Time   COLORURINE YELLOW 05/15/2018 [place] 05/15/2018 1516   LABSPEC >=1.030 (A) 05/15/2018 1516   PHURINE 6.0 05/15/2018 1516   GLUCOSEU NEGATIVE 05/15/2018 1516   HGBUR TRACE-INTACT (A) 05/15/2018 1516   BILIRUBINUR NEGATIVE 05/15/2018 1516   KETONESUR NEGATIVE 05/15/2018 1516   PROTEINUR >300 (A) 09/01/2013 2030   UROBILINOGEN 0.2 05/15/2018 1516   NITRITE NEGATIVE 05/15/2018 1516   LEUKOCYTESUR NEGATIVE 05/15/2018 1516   Sepsis Labs: _0 (procalcitonin:4,lacticidven:4) ) Recent Results (from the past 240 hour(s))  Urine Culture     Status: None   Collection Time: 05/15/18  3:16 PM  Result Value Ref Range Status   MICRO NUMBER: [id number]  Final   SPECIMEN QUALITY: Adequate  Final   Sample Source URINE  Final   STATUS: FINAL  Final   Result:   Final    Multiple organisms present, each less than 10,000 CFU/mL. These organisms, commonly found on external and internal genitalia, are considered to be colonizers. No further testing performed.  SARS Coronavirus 2 (CEPHEID - Performed in [place] hospital lab), Hosp Order     Status: None   Collection Time: 05/16/18  9:23 PM  Result Value Ref Range Status  SARS Coronavirus 2  NEGATIVE NEGATIVE Final    Comment: (NOTE) If result is NEGATIVE SARS-CoV-2 target nucleic acids are NOT DETECTED. The SARS-CoV-2 RNA is generally detectable in upper and lower  respiratory specimens during the acute phase of infection. The lowest  concentration of SARS-CoV-2 viral copies this assay can detect is 250  copies / mL. A negative result does not preclude SARS-CoV-2 infection  and should not be used as the sole basis for treatment or other  patient management decisions.  A negative result may occur with  improper specimen collection / handling, submission of specimen other  than nasopharyngeal swab, presence of viral mutation(s) within the  areas targeted by this assay, and inadequate number of viral copies  (<250 copies / mL). A negative result must be combined with clinical  observations, patient history, and epidemiological information. If result is POSITIVE SARS-CoV-2 target nucleic acids are DETECTED. The SARS-CoV-2 RNA is generally detectable in upper and lower  respiratory specimens dur ing the acute phase of infection.  Positive  results are indicative of active infection with SARS-CoV-2.  Clinical  correlation with patient history and other diagnostic information is  necessary to determine patient infection status.  Positive results do  not rule out bacterial infection or co-infection with other viruses. If result is PRESUMPTIVE POSTIVE SARS-CoV-2 nucleic acids MAY BE PRESENT.   A presumptive positive result was obtained on the submitted specimen  and confirmed on repeat testing.  While 2019 novel coronavirus  (SARS-CoV-2) nucleic acids may be present in the submitted sample  additional confirmatory testing may be necessary for epidemiological  and / or clinical management purposes  to differentiate between  SARS-CoV-2 and other Sarbecovirus currently known to infect humans.  If clinically indicated additional testing with an alternate test  methodology [phone])  is advised. The SARS-CoV-2 RNA is generally  detectable in upper and lower respiratory sp ecimens during the acute  phase of infection. The expected result is Negative. Fact Sheet for Patients:  StrictlyIdeas.no Fact Sheet for Healthcare Providers: BankingDealers.co.za This test is not yet approved or cleared by the [place] FDA and has been authorized for detection and/or diagnosis of SARS-CoV-2 by FDA under an Emergency Use Authorization (EUA).  This EUA will remain in effect (meaning this test can be used) for the duration of the COVID-19 declaration under Section 564(b)(1) of the Act, 21 U.S.C. section 360bbb-3(b)(1), unless the authorization is terminated or revoked sooner. Performed at [facility], [place] [address], [place], [place] [address]      Radiological Exams on Admission: Dg Chest 2 View  Result Date: 05/16/2018 CLINICAL DATA:  85 year old female with a history of altered mental status EXAM: CHEST - 2 VIEW COMPARISON:  10/12/2014, 09/01/2013 FINDINGS: Cardiomediastinal silhouette unchanged in size and contour. No pneumothorax or pleural effusion. No confluent airspace disease. Coarsened interstitial markings. Calcifications of the aortic arch. No displaced fracture.  Degenerative changes of the spine IMPRESSION: Negative for acute cardiopulmonary disease Electronically Signed   By: Corrie Mckusick D.O.   On: 05/16/2018 08:03   Mr Brain Wo Contrast  Result Date: 05/16/2018 CLINICAL DATA:  Initial evaluation for acute confusion, memory loss for 1 week. EXAM: MRI HEAD WITHOUT CONTRAST TECHNIQUE: Multiplanar, multiecho pulse sequences of the brain and surrounding structures were obtained without intravenous contrast. COMPARISON:  None available. FINDINGS: Brain: Cerebral volume within normal limits for age. Mild patchy T2/FLAIR hyperintensity within the periventricular and deep white matter both cerebral hemispheres most  consistent with chronic small  vessel ischemic disease, mild in nature. Large confluent area of restricted diffusion involving the parasagittal left occipital lobe compatible with acute to early subacute left PCA territory infarct. Mild patchy involvement of left thalamus. No associated hemorrhage. Mild localized mass effect without midline shift. No other evidence for acute or subacute ischemia. Gray-white matter differentiation otherwise maintained. No other areas of chronic cortical infarction. No acute or chronic intracranial hemorrhage. No mass lesion, midline shift or mass effect. No hydrocephalus. No extra-axial fluid collection. Normal pituitary gland. Vascular: Major intracranial vascular flow voids are maintained. Skull and upper cervical spine: Craniocervical junction within normal limits. Visualized upper cervical spine normal. Bone marrow signal intensity within normal limits. No scalp soft tissue abnormality. Sinuses/Orbits: Globes and orbital soft tissues within normal limits. Moderate mucosal thickening noted within the right maxillary sinus, likely chronic. Paranasal sinuses are otherwise largely clear. No mastoid effusion. Inner ear structures normal. Other: None. IMPRESSION: 1. Large acute to early subacute left PCA territory infarct. No associated hemorrhage or significant regional mass effect. 2. Underlying mild for age chronic microvascular ischemic disease. Results were called by telephone at the time of interpretation on 05/16/2018 at 7:14 pm to the physician assistant in the emergency room, [facility], who verbally acknowledged these results. Electronically Signed   By: Jeannine Boga M.D.   On: 05/16/2018 19:15    EKG: Independently reviewed showed sinus rhythm at a rate of 70 bpm with T wave inversion in V4-V6  Assessment/Plan Principal Problem:   CVA (cerebral vascular accident) ([place]) Active Problems:   DM type 2 (diabetes mellitus, type 2) ([place])   Hypertension    Hyperlipemia   GERD (gastroesophageal reflux disease)   A-fib ([place])   CKD (chronic kidney disease), stage III ([place])   Noncompliance   Vitamin D deficiency   Abnormal EKG   Headache   Acute/subacute CVA MRI of the head without contrast showed large acute to early subacute left PCA territory infarct  Patient will be admitted to progressive unit with telemetry Echocardiogram will be done Bilateral carotid ultrasound will be done CTA head and neck will be held at this time due to patient's kidney status Continue aspirin and statin Neurologist(Dr. Arror) was already consulted per ED physician, we shall await further recommendation. Continue n.p.o till after swallow eval at bedside Continue fall/aspiration/seizure precautions and neuro checks Continue PT/ST/OT eval and treat  Hypertension (uncontrolled) Permissive hypertension will be allowed at this time  Headache Continue Tylenol as needed  Hyperlipidemia  Continue atorvastatin  Type 2 diabetes mellitus  Hgb A1c= 6.4 Continue insulin sliding scale and hypoglycemia protocol Metformin will be held at this time  Atrial fibrillation  CHADS-VASc score=7 Patient appears not to be on any anticoagulant Continue telemetry Heart rate currently controlled; rate control meds will be resumed  GERD Continue Protonix  Acute on chronic CKD IV Patient presents with barriers to creatinine 31/1.93 Continue IV hydration Avoid nephrotoxic drugs at this time  Vitamin D deficiency Vitamin D=15.51; patient will be started on vitamin D  Abnormal EKG EKG showed T wave inversion in V4-V6, she denies any chest pain Continue to trend troponin   Medication noncompliance  Patient has history of medication noncompliance She will be counseled on importance of being compliant with medication   DVT prophylaxis: SCDs  Code Status: Full  Family Communication: Son at bedside  Disposition Plan: Observation with plan to discharge patient home  in less than 48 hours pending neurologist recommendation.  Consults called: Neurologis (Dr. Lorraine Lax) Admission status: Observation  Bernadette Hoit MD [facility] If 7PM-7AM, please contact night-coverage 05/16/2018, 11:45 PM

## 2018-05-16 NOTE — Consult Note (Addendum)
Requesting Physician: Dr. Ralene Bathe    Chief Complaint: Confusion  History obtained from: Patient and Chart    HPI:                                                                                                                                       Kimberly Castillo is an 85 y.o. female with past medical history of atrial fibrillation not on anticoagulation, diabetes mellitus, hyperlipidemia presents to the ED after being confused since Saturday.  Patient was evaluated by her PCP on Tuesday who recommended MRI brain to rule out CVA for patient's confusion.  MRI brain was done today as outpatient which showed a large acute to subacute left PCA infarction and requested to go to the emergency department.  Patient was assessed this morning, is a poor historian and cannot really recall what happened.  She is complaining of a headache on the left side.  She takes aspirin daily at home.  Date last known well: 05/10/18 tPA Given: No, outside window NIHSS: 3 Baseline MRS 0    Past Medical History:  Diagnosis Date  . A-fib ([place])   . Anxiety state 12/22/2013  . Arthritis    hands, back  . Arthritis, lumbar spine 12/19/2012  . Cataracts, bilateral   . Chronic lower back pain 12/19/2012  . Diabetes mellitus ([place])   . Diverticulitis   . Dysrhythmia   . GERD (gastroesophageal reflux disease)   . Glaucoma   . Headache    hx migraines  . Heart murmur   . History of shingles   . Hyperlipidemia   . Hypertension   . Numbness    rt side of rt leg  . Post herpetic neuralgia 12/19/2012    Past Surgical History:  Procedure Laterality Date  . ABDOMINAL HYSTERECTOMY  1964   partial  . COLON RESECTION N/A 09/02/2013   Procedure: [place] colectomy;  Surgeon: Excell Seltzer, MD;  Location: [facility];  Service: General;  Laterality: N/A;  . COLONOSCOPY    . COLOSTOMY TAKEDOWN N/A 11/09/2014   Procedure: TAKEDOWN [place] COLOSTOMY;  Surgeon: Excell Seltzer, MD;  Location: [facility];  Service: General;   Laterality: N/A;  . [place]   left  . POLYPECTOMY      Family History  Problem Relation Age of Onset  . Hypertension Mother   . Hypertension Maternal Grandmother   . Colon cancer Neg Hx   . Stomach cancer Neg Hx   . Colon polyps Neg Hx   . Esophageal cancer Neg Hx   . Rectal cancer Neg Hx    Social History:  reports that she quit smoking about 20 years ago. Her smoking use included cigarettes. She started smoking about 26 years ago. She has never used smokeless tobacco. She reports that she does not drink alcohol or use drugs.  Allergies: No Known Allergies  Medications:  I reviewed home medications   ROS:                                                                                                                                     14 systems reviewed and negative except above    Examination:                                                                                                      General: Appears well-developed  Psych: Affect appropriate to situation Eyes: No scleral injection HENT: No OP obstrucion Head: Normocephalic.  Cardiovascular: Normal rate and regular rhythm.  Respiratory: Effort normal and breath sounds normal to anterior ascultation GI: Soft.  No distension. There is no tenderness.  Skin: WDI    Neurological Examination Mental Status: Alert, oriented to place and person.  Unable to state month correctly.  Appears to understand the situation and asked appropriate questions.  Speech fluent without evidence of aphasia. Able to follow 3 step commands without difficulty. Cranial Nerves: II: Visual fields : Right homonymous hemianopsia III,IV, VI: ptosis not present, extra-ocular motions intact bilaterally, pupils equal, round, reactive to light and accommodation V,VII: smile symmetric, facial light  touch sensation normal bilaterally VIII: hearing normal bilaterally IX,X: uvula rises symmetrically XI: bilateral shoulder shrug XII: midline tongue extension Motor: Right : Upper extremity   5/5    Left:     Upper extremity   5/5  Lower extremity   5/5     Lower extremity   5/5 Tone and bulk:normal tone throughout; no atrophy noted Sensory: Pinprick and light touch intact throughout, bilaterally Deep Tendon Reflexes:  Plantars: Right: downgoing   Left: downgoing Cerebellar: normal finger-to-nose, normal rapid alternating movements and normal heel-to-shin test Gait: not assessed     Lab Results: Basic Metabolic Panel: Recent Labs  Lab 05/15/18 1328 05/16/18 1919 05/16/18 1928  NA 141 142 142  K 3.7 3.4* 3.3*  CL 106 108 109  CO2 26 23  --   GLUCOSE 190* 91 93  BUN 32* 31* 30*  CREATININE 2.09* 1.93* 2.00*  CALCIUM 9.2 9.6  --   MG 1.8  --   --     CBC: Recent Labs  Lab 05/15/18 1328 05/16/18 1919 05/16/18 1928  WBC 5.9 5.9  --   NEUTROABS 3.5 3.2  --   HGB 12.1 11.7* 12.2  HCT 36.1 36.5 36.0  MCV 97.6 99.2  --   PLT 201.0 212  --  Coagulation Studies: Recent Labs    05/16/18 1919  LABPROT 13.9  INR 1.1    Imaging: Dg Chest 2 View  Result Date: 05/16/2018 CLINICAL DATA:  85 year old female with a history of altered mental status EXAM: CHEST - 2 VIEW COMPARISON:  10/12/2014, 09/01/2013 FINDINGS: Cardiomediastinal silhouette unchanged in size and contour. No pneumothorax or pleural effusion. No confluent airspace disease. Coarsened interstitial markings. Calcifications of the aortic arch. No displaced fracture.  Degenerative changes of the spine IMPRESSION: Negative for acute cardiopulmonary disease Electronically Signed   By: Corrie Mckusick D.O.   On: 05/16/2018 08:03   Mr Brain Wo Contrast  Result Date: 05/16/2018 CLINICAL DATA:  Initial evaluation for acute confusion, memory loss for 1 week. EXAM: MRI HEAD WITHOUT CONTRAST TECHNIQUE: Multiplanar,  multiecho pulse sequences of the brain and surrounding structures were obtained without intravenous contrast. COMPARISON:  None available. FINDINGS: Brain: Cerebral volume within normal limits for age. Mild patchy T2/FLAIR hyperintensity within the periventricular and deep white matter both cerebral hemispheres most consistent with chronic small vessel ischemic disease, mild in nature. Large confluent area of restricted diffusion involving the parasagittal left occipital lobe compatible with acute to early subacute left PCA territory infarct. Mild patchy involvement of left thalamus. No associated hemorrhage. Mild localized mass effect without midline shift. No other evidence for acute or subacute ischemia. Gray-white matter differentiation otherwise maintained. No other areas of chronic cortical infarction. No acute or chronic intracranial hemorrhage. No mass lesion, midline shift or mass effect. No hydrocephalus. No extra-axial fluid collection. Normal pituitary gland. Vascular: Major intracranial vascular flow voids are maintained. Skull and upper cervical spine: Craniocervical junction within normal limits. Visualized upper cervical spine normal. Bone marrow signal intensity within normal limits. No scalp soft tissue abnormality. Sinuses/Orbits: Globes and orbital soft tissues within normal limits. Moderate mucosal thickening noted within the right maxillary sinus, likely chronic. Paranasal sinuses are otherwise largely clear. No mastoid effusion. Inner ear structures normal. Other: None. IMPRESSION: 1. Large acute to early subacute left PCA territory infarct. No associated hemorrhage or significant regional mass effect. 2. Underlying mild for age chronic microvascular ischemic disease. Results were called by telephone at the time of interpretation on 05/16/2018 at 7:14 pm to the physician assistant in the emergency room, [facility], who verbally acknowledged these results. Electronically Signed   By: Jeannine Boga M.D.   On: 05/16/2018 19:15     ASSESSMENT AND PLAN  85 y.o. female with past medical history of atrial fibrillation not on anticoagulation, diabetes mellitus, hyperlipidemia presents to the ED after being confused since Saturday. MRI brain shows a large left PCA infarct with no hemorrhagic transformation.  Clinically she has a left homonymous hemianopsia.  Likely etiology for stroke is atrial fibrillation, patient not on anticoagulation but has high CADS2VASC2 score.    Large left PCA  Subacute stroke likely due to Afib   #MRA head and carotid doppler  #Transthoracic Echo  #Continue aspirin for now, will need to start anticoagulation.  Eliquis would be ideal choice and echocardiogram shows no valvular abnormalities, LV thrombus.  Patient is approximately 7 days out from her stroke and therefore we should be able to start anticoagulation. #Start or continue Atorvastatin 80 mg/other high intensity statin # BP goal: 140/90 mmHg # HBAIC and Lipid profile # Telemetry monitoring # Frequent neuro checks #  stroke swallow screen  Please page stroke NP  Or  PA  Or MD from 8am -4 pm  as this patient from this time will  be  followed by the stroke.   You can look them up on [email or web address]  Password [facility]     Chayanne Speir [facility] Pager Number [phone]

## 2018-05-16 NOTE — Telephone Encounter (Signed)
Ok to take together in this case, thanks

## 2018-05-16 NOTE — ED Notes (Addendum)
Kimberly Castillo, son [phone]  Kimberly Castillo, daughter [phone]

## 2018-05-17 ENCOUNTER — Inpatient Hospital Stay (HOSPITAL_COMMUNITY): Payer: Medicare HMO

## 2018-05-17 ENCOUNTER — Observation Stay (HOSPITAL_COMMUNITY): Payer: Medicare HMO

## 2018-05-17 ENCOUNTER — Other Ambulatory Visit: Payer: Self-pay | Admitting: Internal Medicine

## 2018-05-17 ENCOUNTER — Encounter: Payer: Self-pay | Admitting: Internal Medicine

## 2018-05-17 DIAGNOSIS — Z7984 Long term (current) use of oral hypoglycemic drugs: Secondary | ICD-10-CM | POA: Diagnosis not present

## 2018-05-17 DIAGNOSIS — K219 Gastro-esophageal reflux disease without esophagitis: Secondary | ICD-10-CM | POA: Diagnosis present

## 2018-05-17 DIAGNOSIS — Z1159 Encounter for screening for other viral diseases: Secondary | ICD-10-CM | POA: Diagnosis not present

## 2018-05-17 DIAGNOSIS — N184 Chronic kidney disease, stage 4 (severe): Secondary | ICD-10-CM

## 2018-05-17 DIAGNOSIS — Z9119 Patient's noncompliance with other medical treatment and regimen: Secondary | ICD-10-CM | POA: Diagnosis not present

## 2018-05-17 DIAGNOSIS — E1159 Type 2 diabetes mellitus with other circulatory complications: Secondary | ICD-10-CM | POA: Diagnosis not present

## 2018-05-17 DIAGNOSIS — Z90710 Acquired absence of both cervix and uterus: Secondary | ICD-10-CM | POA: Diagnosis not present

## 2018-05-17 DIAGNOSIS — F411 Generalized anxiety disorder: Secondary | ICD-10-CM | POA: Diagnosis present

## 2018-05-17 DIAGNOSIS — E785 Hyperlipidemia, unspecified: Secondary | ICD-10-CM

## 2018-05-17 DIAGNOSIS — Z79899 Other long term (current) drug therapy: Secondary | ICD-10-CM | POA: Diagnosis not present

## 2018-05-17 DIAGNOSIS — E1122 Type 2 diabetes mellitus with diabetic chronic kidney disease: Secondary | ICD-10-CM | POA: Diagnosis present

## 2018-05-17 DIAGNOSIS — R29703 NIHSS score 3: Secondary | ICD-10-CM | POA: Diagnosis present

## 2018-05-17 DIAGNOSIS — Z7982 Long term (current) use of aspirin: Secondary | ICD-10-CM | POA: Diagnosis not present

## 2018-05-17 DIAGNOSIS — G9341 Metabolic encephalopathy: Secondary | ICD-10-CM | POA: Diagnosis present

## 2018-05-17 DIAGNOSIS — H53462 Homonymous bilateral field defects, left side: Secondary | ICD-10-CM | POA: Diagnosis present

## 2018-05-17 DIAGNOSIS — E119 Type 2 diabetes mellitus without complications: Secondary | ICD-10-CM

## 2018-05-17 DIAGNOSIS — I639 Cerebral infarction, unspecified: Secondary | ICD-10-CM

## 2018-05-17 DIAGNOSIS — N183 Chronic kidney disease, stage 3 (moderate): Secondary | ICD-10-CM | POA: Diagnosis not present

## 2018-05-17 DIAGNOSIS — I63432 Cerebral infarction due to embolism of left posterior cerebral artery: Principal | ICD-10-CM

## 2018-05-17 DIAGNOSIS — I361 Nonrheumatic tricuspid (valve) insufficiency: Secondary | ICD-10-CM

## 2018-05-17 DIAGNOSIS — I10 Essential (primary) hypertension: Secondary | ICD-10-CM | POA: Diagnosis not present

## 2018-05-17 DIAGNOSIS — Z9114 Patient's other noncompliance with medication regimen: Secondary | ICD-10-CM | POA: Diagnosis not present

## 2018-05-17 DIAGNOSIS — H53461 Homonymous bilateral field defects, right side: Secondary | ICD-10-CM | POA: Diagnosis present

## 2018-05-17 DIAGNOSIS — E559 Vitamin D deficiency, unspecified: Secondary | ICD-10-CM | POA: Diagnosis present

## 2018-05-17 DIAGNOSIS — I129 Hypertensive chronic kidney disease with stage 1 through stage 4 chronic kidney disease, or unspecified chronic kidney disease: Secondary | ICD-10-CM | POA: Diagnosis present

## 2018-05-17 DIAGNOSIS — E876 Hypokalemia: Secondary | ICD-10-CM | POA: Diagnosis present

## 2018-05-17 DIAGNOSIS — I6389 Other cerebral infarction: Secondary | ICD-10-CM | POA: Diagnosis not present

## 2018-05-17 DIAGNOSIS — Z87891 Personal history of nicotine dependence: Secondary | ICD-10-CM | POA: Diagnosis not present

## 2018-05-17 LAB — GLUCOSE, CAPILLARY
Glucose-Capillary: 80 mg/dL (ref 70–99)
Glucose-Capillary: 107 mg/dL — ABNORMAL HIGH (ref 70–99)
Glucose-Capillary: 85 mg/dL (ref 70–99)
Glucose-Capillary: 87 mg/dL (ref 70–99)

## 2018-05-17 LAB — TROPONIN I
Troponin I: 0.03 ng/mL (ref <0.03)
Troponin I: 0.04 ng/mL (ref <0.03)
Troponin I: 0.04 ng/mL (ref <0.03)

## 2018-05-17 MED ORDER — VITAMIN D (ERGOCALCIFEROL) 1.25 MG (50000 UNIT) PO CAPS: 50000.0000 [IU] | ORAL_CAPSULE | ORAL | 0 refills | Status: DC

## 2018-05-17 MED ORDER — HALOPERIDOL LACTATE 5 MG/ML IJ SOLN
2.5000 mg | Freq: Once | INTRAMUSCULAR | Status: AC
  Administered 2018-05-17: 23:00:00 2.5 mg via INTRAVENOUS
  Filled 2018-05-17: qty 1

## 2018-05-17 MED ORDER — CLOPIDOGREL BISULFATE 75 MG PO TABS
75.0000 mg | ORAL_TABLET | Freq: Every day | ORAL | Status: DC
  Administered 2018-05-17 – 2018-05-19 (×3): 75 mg via ORAL
  Filled 2018-05-17 (×3): qty 1

## 2018-05-17 MED ORDER — ASPIRIN EC 81 MG PO TBEC
81.0000 mg | DELAYED_RELEASE_TABLET | Freq: Every day | ORAL | Status: DC
  Administered 2018-05-18: 81 mg via ORAL
  Filled 2018-05-17: qty 1

## 2018-05-17 MED ORDER — ASPIRIN EC 325 MG PO TBEC
325.0000 mg | DELAYED_RELEASE_TABLET | Freq: Every day | ORAL | Status: DC
  Administered 2018-05-17: 09:00:00 325 mg via ORAL
  Filled 2018-05-17: qty 1

## 2018-05-17 MED ORDER — DILTIAZEM HCL 30 MG PO TABS: 120.0000 mg | ORAL_TABLET | Freq: Every day | ORAL | Status: DC

## 2018-05-17 MED ORDER — DILTIAZEM HCL ER COATED BEADS 120 MG PO CP24
120.0000 mg | ORAL_CAPSULE | Freq: Every day | ORAL | Status: DC
  Administered 2018-05-17 – 2018-05-19 (×3): 120 mg via ORAL
  Filled 2018-05-17 (×3): qty 1

## 2018-05-17 NOTE — Evaluation (Signed)
Physical Therapy Evaluation Patient Details Name: Kimberly Castillo MRN: [id number] DOB: Nov 09, 1933 Today's Date: 05/17/2018   History of Present Illness  Pt is a 85 yo F with primary problem on CVA. PMH significant for CVA, T2DM, HTN, hyperlipidemia, GERD, Afib, CKD.   Clinical Impression  Pt found on bed-side commode. Was able to slowly transfer back to seated EOB with minA and holding bed rails. Demonstrated seated balance control with supervision. Prior cognitive function unknown due to family not present but pt had difficulty remembering the names of her children and grand children. She had changes to her stories throughout treatment stating once that her son was married and later that none were married. Pt repeatedly ran into walls and obstacles on her left side while ambulating- reported she could see them but "I didn't think I was that close." Will benefit from gait training to improve safety.     Follow Up Recommendations Home health PT    Equipment Recommendations  Rolling walker with 5" wheels    Recommendations for Other Services OT consult     Precautions / Restrictions Precautions Precautions: Fall Restrictions Weight Bearing Restrictions: No      Mobility  Bed Mobility Overal bed mobility: Independent                Transfers Overall transfer level: Modified independent Equipment used: Rolling walker (2 wheeled)                Ambulation/Gait Ambulation/Gait assistance: Min guard Gait Distance (Feet): 50 Feet Assistive device: Rolling walker (2 wheeled) Gait Pattern/deviations: Drifts right/left Gait velocity: decreased   General Gait Details: repeatedly ran into wall and other objects on her left side- reported she thought she was getting close to the wall but I didn't thiink I was that close  Science writer    Modified Rankin (Stroke Patients Only)       Balance Overall balance assessment: Mild deficits  observed, not formally tested                                           Pertinent Vitals/Pain Pain Assessment: Faces Faces Pain Scale: Hurts a little bit Pain Location: thoracic spine Pain Descriptors / Indicators: Aching Pain Intervention(s): Monitored during session    Home Living Family/patient expects to be discharged to:: Private residence Living Arrangements: Children Available Help at Discharge: Family;Available 24 hours/day Type of Home: House Home Access: Other (comment)(pt unsure)       Home Equipment: Cane - single point Additional Comments: reports she only uses it sometimes    Prior Function Level of Independence: Independent with assistive device(s)         Comments: pt reports she is able to do what she wants to do     Hand Dominance        Extremity/Trunk Assessment   Upper Extremity Assessment Upper Extremity Assessment: Defer to OT evaluation    Lower Extremity Assessment Lower Extremity Assessment: Overall WFL for tasks assessed    Cervical / Trunk Assessment Cervical / Trunk Assessment: Normal  Communication   Communication: No difficulties  Cognition Arousal/Alertness: Awake/alert Behavior During Therapy: WFL for tasks assessed/performed Overall Cognitive Status: No family/caregiver present to determine baseline cognitive functioning  General Comments: pt had difficulty remembering the names of children, once told me about her son being married and later talked about all of her sons being single and won't get married      General Comments General comments (skin integrity, edema, etc.): drifting to the Left    Exercises     Assessment/Plan    PT Assessment Patient needs continued PT services  PT Problem List Decreased activity tolerance;Decreased knowledge of precautions;Decreased mobility;Decreased knowledge of use of DME       PT Treatment Interventions DME  instruction;Therapeutic exercise;Gait training;Balance training;Functional mobility training;Therapeutic activities;Patient/family education    PT Goals (Current goals can be found in the Care Plan section)  Acute Rehab PT Goals Patient Stated Goal: get out of here PT Goal Formulation: With patient Time For Goal Achievement: 05/31/18 Potential to Achieve Goals: Good    Frequency Min 2X/week   Barriers to discharge        Co-evaluation               AM-PAC PT "6 Clicks" Mobility  Outcome Measure Help needed turning from your back to your side while in a flat bed without using bedrails?: None Help needed moving from lying on your back to sitting on the side of a flat bed without using bedrails?: A Little Help needed moving to and from a bed to a chair (including a wheelchair)?: A Little Help needed standing up from a chair using your arms (e.g., wheelchair or bedside chair)?: A Little Help needed to walk in hospital room?: A Little Help needed climbing 3-5 steps with a railing? : A Lot 6 Click Score: 18    End of Session Equipment Utilized During Treatment: Gait belt Activity Tolerance: Patient tolerated treatment well Patient left: in chair;with call bell/phone within reach;with chair alarm set   PT Visit Diagnosis: Unsteadiness on feet (R26.81);Other abnormalities of gait and mobility (R26.89)    Time: 0630-1601 PT Time Calculation (min) (ACUTE ONLY): 23 min   Charges:   PT Evaluation $PT Eval Moderate Complexity: 1 Mod PT Treatments $Gait Training: 8-22 mins       Selinda Eon PT, DPT Acute Rehab [phone]

## 2018-05-17 NOTE — Evaluation (Signed)
Occupational Therapy Evaluation Patient Details Name: Kimberly Castillo MRN: [id number] DOB: [birth year] Today's Date: 05/17/2018    History of Present Illness Pt is a 85 yo F with primary problem on CVA. PMH significant for CVA, T2DM, HTN, hyperlipidemia, GERD, Afib, CKD.    Clinical Impression   Pt admitted with above. She demonstrates the below listed deficits and will benefit from continued OT to maximize safety and independence with BADLs.  Pt presents to OT with Dense Rt hemianopsia with Rt spatial inattention coupled with significant cognitive deficits which significantly impair her ability to perform ADLs.  Currently she requires min guard and max cues - mod A.   PTA, she lived with family.  She is an unreliable historian and unable to provide specific info re: her PLOF.  Recommend 24 hour supervision and HHOT at discharge.      Follow Up Recommendations  Home health OT;Supervision/Assistance - 24 hour    Equipment Recommendations  Tub/shower seat    Recommendations for Other Services       Precautions / Restrictions Precautions Precautions: Fall      Mobility Bed Mobility Overal bed mobility: Independent                Transfers Overall transfer level: Needs assistance Equipment used: None Transfers: Sit to/from Stand;Stand Pivot Transfers Sit to Stand: Min guard Stand pivot transfers: Min guard       General transfer comment: verbal cues and gaurdign due to visual deficits    Balance Overall balance assessment: Mild deficits observed, not formally tested                                         ADL either performed or assessed with clinical judgement   ADL Overall ADL's : Needs assistance/impaired Eating/Feeding: Set up;Supervision/ safety;Sitting   Grooming: Wash/dry hands;Wash/dry face;Oral care;Min guard;Standing Grooming Details (indicate cue type and reason): Pt requires max verbal cues to locate grooming items, to locate faucette  handles and how to use the context of the environment to problem solve where items should be.  Poor carry over of information  Upper Body Bathing: Supervision/ safety;Sitting   Lower Body Bathing: Min guard;Sit to/from stand   Upper Body Dressing : Moderate assistance;Sitting   Lower Body Dressing: Sit to/from stand;Moderate assistance   Toilet Transfer: Minimal assistance;Ambulation;Comfort height toilet Toilet Transfer Details (indicate cue type and reason): verbal cues to negotiate environment  Toileting- Clothing Manipulation and Hygiene: Minimal assistance;Sit to/from stand       Functional mobility during ADLs: Min guard       Vision Baseline Vision/History: Wears glasses Wears Glasses: At all times Patient Visual Report: Peripheral vision impairment Vision Assessment?: Yes Eye Alignment: Within Functional Limits Ocular Range of Motion: Within Functional Limits Tracking/Visual Pursuits: Able to track stimulus in all quads without difficulty Visual Fields: Right homonymous hemianopsia Additional Comments: Pt appears to have full homonymous hemianopsia with macular involvement as she does not see an item until it is directly in front of her      Perception Perception Perception Tested?: Yes Perception Deficits: Inattention/neglect Inattention/Neglect: Does not attend to right visual field   Praxis Praxis Praxis tested?: Within functional limits    Pertinent Vitals/Pain Pain Assessment: No/denies pain     Hand Dominance Right   Extremity/Trunk Assessment Upper Extremity Assessment Upper Extremity Assessment: Overall WFL for tasks assessed   Lower Extremity Assessment Lower  Extremity Assessment: Overall WFL for tasks assessed   Cervical / Trunk Assessment Cervical / Trunk Assessment: Normal   Communication Communication Communication: No difficulties   Cognition Arousal/Alertness: Awake/alert Behavior During Therapy: WFL for tasks  assessed/performed Overall Cognitive Status: No family/caregiver present to determine baseline cognitive functioning Area of Impairment: Attention;Memory;Following commands;Orientation;Safety/judgement;Awareness;Problem solving                 Orientation Level: Disoriented to;Place;Time;Situation Current Attention Level: Sustained Memory: Decreased short-term memory Following Commands: Follows one step commands consistently Safety/Judgement: Decreased awareness of safety;Decreased awareness of deficits   Problem Solving: Difficulty sequencing;Requires verbal cues;Requires tactile cues General Comments: Pt self distracts frequently.  She is unaware that she had a stroke or of her deficits.   She requires max cues for problem solving during ADL tasks    General Comments       Exercises     Shoulder Instructions      Home Living Family/patient expects to be discharged to:: Private residence Living Arrangements: Children Available Help at Discharge: Family;Available 24 hours/day Type of Home: House Home Access: Other (comment)     Home Layout: One level     Bathroom Shower/Tub: Teacher, early years/pre: Standard     Home Equipment: Cane - single point          Prior Functioning/Environment Level of Independence: Independent with assistive device(s)        Comments: Pt is a poor historian so unsure of the accuracy of info         OT Problem List: Decreased activity tolerance;Impaired balance (sitting and/or standing);Impaired vision/perception;Decreased cognition;Decreased safety awareness      OT Treatment/Interventions: Self-care/ADL training;DME and/or AE instruction;Therapeutic activities;Cognitive remediation/compensation;Visual/perceptual remediation/compensation;Patient/family education;Balance training    OT Goals(Current goals can be found in the care plan section) Acute Rehab OT Goals Patient Stated Goal: did not state  OT Goal  Formulation: With patient Time For Goal Achievement: 05/31/18 Potential to Achieve Goals: Good ADL Goals Pt Will Perform Grooming: with supervision Pt Will Perform Upper Body Dressing: with supervision;sitting Pt Will Perform Lower Body Dressing: with supervision;sit to/from stand Pt Will Transfer to Toilet: with supervision;ambulating;regular height toilet;grab bars Pt Will Perform Toileting - Clothing Manipulation and hygiene: with supervision;sit to/from stand Additional ADL Goal #1: Pt will locate items on Rt during ADLs with min cues  OT Frequency: Min 2X/week   Barriers to D/C:            Co-evaluation              AM-PAC OT "6 Clicks" Daily Activity     Outcome Measure Help from another person eating meals?: None Help from another person taking care of personal grooming?: A Little Help from another person toileting, which includes using toliet, bedpan, or urinal?: A Little Help from another person bathing (including washing, rinsing, drying)?: A Little Help from another person to put on and taking off regular upper body clothing?: A Lot Help from another person to put on and taking off regular lower body clothing?: A Lot 6 Click Score: 17   End of Session Nurse Communication: Mobility status  Activity Tolerance: Patient tolerated treatment well Patient left: in bed;with call bell/phone within reach;with bed alarm set  OT Visit Diagnosis: Low vision, both eyes (H54.2)                Time: 3235-5732 OT Time Calculation (min): 43 min Charges:  OT General Charges $OT Visit: 1 Visit OT Evaluation $OT  Eval Moderate Complexity: 1 Mod OT Treatments $Self Care/Home Management : 23-37 mins  Lucille Passy, OTR/L Acute Rehabilitation Services Pager [phone] Office [place], [place] 05/17/2018, 5:42 PM

## 2018-05-17 NOTE — Progress Notes (Signed)
STROKE TEAM PROGRESS NOTE   SUBJECTIVE (INTERVAL HISTORY) Her transport is at the bedside to get her to MRA test.  She stated that her HA is much better.  However, she is still confused, not fully orientated.  She had A. fib as PMH listed in note, however no previous records showing evidence of A. fib and previous EKGs were all sinus.  Will ask EP to consider loop if no evidence of A. fib in the past.   OBJECTIVE Vitals:   05/17/18 0200 05/17/18 0357 05/17/18 0619 05/17/18 0806  BP: (!) 163/68 (!) 194/99 (!) 208/87 (!) 186/71  Pulse: 71 76 67 66  Resp: 18 18 18 18   Temp:  98.5 F (36.9 C)  98.3 F (36.8 C)  TempSrc:  Oral  Oral  SpO2: 97% 99% 100% 99%  Weight:      Height:        CBC:  Recent Labs  Lab 05/15/18 1328 05/16/18 1919 05/16/18 1928  WBC 5.9 5.9  --   NEUTROABS 3.5 3.2  --   HGB 12.1 11.7* 12.2  HCT 36.1 36.5 36.0  MCV 97.6 99.2  --   PLT 201.0 212  --     Basic Metabolic Panel:  Recent Labs  Lab 05/15/18 1328 05/16/18 1919 05/16/18 1928  NA 141 142 142  K 3.7 3.4* 3.3*  CL 106 108 109  CO2 26 23  --   GLUCOSE 190* 91 93  BUN 32* 31* 30*  CREATININE 2.09* 1.93* 2.00*  CALCIUM 9.2 9.6  --   MG 1.8  --   --     Lipid Panel:     Component Value Date/Time   CHOL 211 (H) 05/15/2018 1328   TRIG 198.0 (H) 05/15/2018 1328   HDL 47.50 05/15/2018 1328   CHOLHDL 4 05/15/2018 1328   VLDL 39.6 05/15/2018 1328   LDLCALC 124 (H) 05/15/2018 1328   HgbA1c:  Lab Results  Component Value Date   HGBA1C 6.4 05/15/2018   Urine Drug Screen: No results found for: LABOPIA, COCAINSCRNUR, LABBENZ, AMPHETMU, THCU, LABBARB  Alcohol Level No results found for: [facility]  IMAGING  Dg Chest 2 View 05/16/2018 IMPRESSION:  Negative for acute cardiopulmonary disease.   Mr Brain Wo Contrast 05/16/2018 IMPRESSION:  1. Large acute to early subacute left PCA territory infarct. No associated hemorrhage or significant regional mass effect.  2. Underlying mild for age  chronic microvascular ischemic disease.   MRA Head - pending   Transthoracic Echocardiogram  Pending   Bilateral Carotid Dopplers  Right Carotid: Velocities in the right ICA are consistent with a 1-39% stenosis. Significant plaque noted. Cannot exclude higher stenosis secondary to body habitus limitation and depth of vessels.  Left Carotid: Velocities in the left ICA are consistent with a 40-59% stenosis. Significant plaque noted. Cannot exclude higher stenosis secondary to body habitus limitation and depth of vessels.  Vertebrals:  Bilateral vertebral arteries demonstrate antegrade flow. Subclavians: Normal flow hemodynamics were seen in bilateral subclavian arteries.   EKG - SR rate 70 BPM. (See cardiology reading for complete details)    PHYSICAL EXAM  Temp:  [98.1 F (36.7 C)-98.5 F (36.9 C)] 98.1 F (36.7 C) (05/16 1143) Pulse Rate:  [62-76] 74 (05/16 1143) Resp:  [16-20] 16 (05/16 1143) BP: (157-208)/(68-99) 181/88 (05/16 1400) SpO2:  [97 %-100 %] 99 % (05/16 1143) Weight:  [61.2 kg] 61.2 kg (05/15 1845)  General - Well nourished, well developed, in no apparent distress.  Ophthalmologic - fundi not visualized  due to noncooperation.  Cardiovascular - Regular rate and rhythm.  Mental Status -  Awake, alert but not orientation to time, place, or person. Follows simple commands, fluent speech however perseverated on finger naming.  Anomia, able to repeat simple sentences but not a complex sentences.  Cranial Nerves II - XII - II - right hemianopia. III, IV, VI - Extraocular movements intact. V - Facial sensation intact bilaterally. VII - Facial movement intact bilaterally. VIII - Hearing & vestibular intact bilaterally. X - Palate elevates symmetrically. XI - Chin turning & shoulder shrug intact bilaterally. XII - Tongue protrusion intact.  Motor Strength - The patient's strength was symmetrical in all extremities and pronator drift was absent.  Bulk was  normal and fasciculations were absent.   Motor Tone - Muscle tone was assessed at the neck and appendages and was normal.  Reflexes - The patient's reflexes were symmetrical in all extremities and she had no pathological reflexes.  Sensory - Light touch, temperature/pinprick were assessed and were symmetrical.    Coordination - The patient had normal movements in the hands with no ataxia or dysmetria.  Tremor was absent.  Gait and Station - deferred.   ASSESSMENT/PLAN [name] is a 85 y.o. female with history of atrial fibrillation not on anticoagulation, diabetes mellitus, hypertension, migraines and hyperlipidemia  presenting with AMS and left sided headache. She did not receive IV t-PA due to late presentation (>4.5 hours from time of onset)  Stroke: Large acute to early subacute left PCA territory infarct including left occipital lobe and left thalamus - embolic pattern, concerning for cardioembolic source.  Resultant right hemianopia, disorientation  MRI head - Large acute to early subacute left PCA territory infarct.  MRA head - pending  Carotid Doppler - left ICA 40 to 59% stenosis  2D Echo - pending  LDL - 124  HgbA1c - 6.4  VTE prophylaxis - SCDs   Diet  - Carb modified with thin liquids  aspirin 81 mg daily prior to admission, now on aspirin 81 mg and plavix 75 mg daily.  Continue DAPT for 3 weeks and then Plavix alone.  Patient counseled to be compliant with her antithrombotic medications  Ongoing aggressive stroke risk factor management  Therapy recommendations:  HH PT  Disposition:  Pending  ??  A. Fib  A. fib was listed as PMH  However there is no previous record of A. Fib  All EKGs were in sinus  Discussed with EP Dr. Rayann Heman and may consider loop recorder if no evidence of A. fib.  Hypertension  BP on the high end  Resume Cardizem  Consider gradually normalize BP in 2 to 3 days . Long-term BP goal  normotensive  Hyperlipidemia  Lipid lowering medication PTA:  Zocor 40 mg daily  LDL 124, goal < 70  Current lipid lowering medication: Lipitor 80 mg daily  Continue statin at discharge  Diabetes  HgbA1c 6.4, goal < 7.0  Controlled  SSI  CBG monitoring  PCP follow up closely  metformin on hold due to elevated Cre  Other Stroke Risk Factors  Advanced age  Former cigarette smoker - quit 22 years ago  Atrial fibrillation without anticoagulation  Migraines  Other Active Problems  Hypokalemia - 3.3  CKD stage III with creatinine - 2.0  Hospital day # 0  Rosalin Hawking, MD PhD Stroke Neurology 05/17/2018 3:32 PM  I spent  35 minutes in total face-to-face time with the patient, more than 50% of which was spent  in counseling and coordination of care, reviewing test results, images and medication, and discussing the diagnosis of left PCA stroke, questionable A. fib, uncontrolled hypertension and AKI on CKD, treatment plan and potential prognosis. This patient's care requiresreview of multiple databases, neurological assessment, discussion with family, other specialists and medical decision making of high complexity.  I also discussed with Dr. Cruzita Lederer and Dr. Rayann Heman regarding patient management.   To contact Stroke Continuity provider, please refer to [email or web address] After hours, contact General Neurology

## 2018-05-17 NOTE — Progress Notes (Signed)
VASCULAR LAB PRELIMINARY  PRELIMINARY  PRELIMINARY  PRELIMINARY  Carotid duplex completed.    Preliminary report:  See CV proc for preliminary report.  Kristyne Woodring, RVT 05/17/2018, 12:38 PM

## 2018-05-17 NOTE — Progress Notes (Signed)
PROGRESS NOTE  Kimberly Castillo KDT:[id number] DOB: 1933-12-01 DOA: 05/16/2018 PCP: Biagio Borg, MD   LOS: 0 days   Brief Narrative / Interim history: 85 year old female with reported history of A. fib, type 2 diabetes mellitus, hyperlipidemia, brought to the hospital by family after a course of about 6 days of altered mental status.  Patient has some short-term memory loss and cannot recall much, but apparently per her son she has been a little bit more confused as well as sleepy.  Patient is aware that she is in the hospital, knows that she is having difficulties recollecting events from the past several days and is somewhat frustrated by that.  She was evaluated as an outpatient and an MRI of the brain showed early subacute left PCA territory infarct.  Patient was brought to the ED for further evaluation and management.  Neurology was consulted on admission.  Subjective: She is feeling well this morning, does not remember much over the last few days and realizes that which makes her somewhat frustrated.  She denies any chest pain or palpitations.  Assessment & Plan: Principal Problem:   CVA (cerebral vascular accident) ([place]) Active Problems:   DM type 2 (diabetes mellitus, type 2) ([place])   Hypertension   Hyperlipemia   GERD (gastroesophageal reflux disease)   A-fib (HCC)   CKD (chronic kidney disease), stage III (HCC)   Noncompliance   Vitamin D deficiency   Abnormal EKG   Headache   Principal Problem Acute stroke -MRI of the head prior to admission showed large acute to early subacute left PCA territory infarct -Neurology was consulted and following patient -2D echo, MRI of the brain, continue aspirin, statin -lipid panel, A1C, PT, OT  Active Problems Hypertension -Allow permissive hypertension  Hyperlipidemia -Continue statin  Type 2 diabetes mellitus -Continue sliding scale, A1c 6.4  ?  History of A. fib -Patient's diagnosis of A. fib dates back as far as I could see  in 2014.  I do not see a documented EKG during this admission nor in the past which confirms the A. Fib.  Telemetry here shows sinus rhythm.  Patient unaware of A. fib, discussed with son over the phone he is also unaware of this.  I am not sure when was her A. fib and under what conditions  Chronic kidney disease stage IV -Creatinine around 1.8-1.9 in the last 12 months, currently 2.0 which is close to baseline.  Continue to avoid nephrotoxic drugs  Scheduled Meds:  aspirin EC  325 mg Oral Daily   atorvastatin  80 mg Oral q1800   insulin aspart  0-5 Units Subcutaneous QHS   insulin aspart  0-9 Units Subcutaneous TID WC   latanoprost  1 drop Both Eyes QHS   sodium chloride flush  3 mL Intravenous Once   timolol  1 drop Both Eyes Daily   Continuous Infusions:  sodium chloride 100 mL/hr at 05/17/18 0854   PRN Meds:.  DVT prophylaxis: SCDs Code Status: Full code Family Communication: d/w son over the phone Disposition Plan: TBD  Consultants:   Neurology   Procedures:   2D echo: pending  Antimicrobials:  None    Objective: Vitals:   05/17/18 0200 05/17/18 0357 05/17/18 0619 05/17/18 0806  BP: (!) 163/68 (!) 194/99 (!) 208/87 (!) 186/71  Pulse: 71 76 67 66  Resp: 18 18 18 18   Temp:  98.5 F (36.9 C)  98.3 F (36.8 C)  TempSrc:  Oral  Oral  SpO2: 97% 99% 100%  99%  Weight:      Height:        Intake/Output Summary (Last 24 hours) at 05/17/2018 1055 Last data filed at 05/17/2018 0954 Gross per 24 hour  Intake 300 ml  Output --  Net 300 ml   Filed Weights   05/16/18 1844 05/16/18 1845  Weight: 61.2 kg 61.2 kg    Examination:  Constitutional: NAD Eyes: PERRL, lids and conjunctivae normal ENMT: Mucous membranes are moist. No oropharyngeal exudates Neck: normal, supple Respiratory: clear to auscultation bilaterally, no wheezing, no crackles. Normal respiratory effort.  Cardiovascular: Regular rate and rhythm, no murmurs / rubs / gallops. No LE  edema. Abdomen: no tenderness. Bowel sounds positive.  Musculoskeletal: no clubbing / cyanosis.  Skin: no rashes Neurologic: CN 2-12 grossly intact. Strength 5/5 in all 4.   Data Reviewed: I have independently reviewed following labs and imaging studies   CBC: Recent Labs  Lab 05/15/18 1328 05/16/18 1919 05/16/18 1928  WBC 5.9 5.9  --   NEUTROABS 3.5 3.2  --   HGB 12.1 11.7* 12.2  HCT 36.1 36.5 36.0  MCV 97.6 99.2  --   PLT 201.0 212  --    Basic Metabolic Panel: Recent Labs  Lab 05/15/18 1328 05/16/18 1919 05/16/18 1928  NA 141 142 142  K 3.7 3.4* 3.3*  CL 106 108 109  CO2 26 23  --   GLUCOSE 190* 91 93  BUN 32* 31* 30*  CREATININE 2.09* 1.93* 2.00*  CALCIUM 9.2 9.6  --   MG 1.8  --   --    GFR: Estimated Creatinine Clearance: 18.3 mL/min (A) (by C-G formula based on SCr of 2 mg/dL (H)). Liver Function Tests: Recent Labs  Lab 05/15/18 1328 05/16/18 1919  AST 18 25  ALT 12 18  ALKPHOS 66 74  BILITOT 0.6 0.5  PROT 6.8 7.2  ALBUMIN 3.4* 3.5   No results for input(s): LIPASE, AMYLASE in the last 168 hours. No results for input(s): AMMONIA in the last 168 hours. Coagulation Profile: Recent Labs  Lab 05/16/18 1919  INR 1.1   Cardiac Enzymes: Recent Labs  Lab 05/16/18 2347 05/17/18 0514  TROPONINI 0.04* 0.03*   BNP (last 3 results) No results for input(s): PROBNP in the last 8760 hours. HbA1C: Recent Labs    05/15/18 1328  HGBA1C 6.4   CBG: Recent Labs  Lab 05/16/18 2002 05/16/18 2316 05/17/18 0636  GLUCAP 86 96 80   Lipid Profile: Recent Labs    05/15/18 1328  CHOL 211*  HDL 47.50  LDLCALC 124*  TRIG 198.0*  CHOLHDL 4   Thyroid Function Tests: Recent Labs    05/15/18 1328  TSH 1.06   Anemia Panel: Recent Labs    05/15/18 1328  VITAMINB12 363   Urine analysis:    Component Value Date/Time   COLORURINE YELLOW 05/15/2018 [place] 05/15/2018 1516   LABSPEC >=1.030 (A) 05/15/2018 1516   PHURINE 6.0  05/15/2018 1516   GLUCOSEU NEGATIVE 05/15/2018 1516   HGBUR TRACE-INTACT (A) 05/15/2018 1516   BILIRUBINUR NEGATIVE 05/15/2018 1516   KETONESUR NEGATIVE 05/15/2018 1516   PROTEINUR >300 (A) 09/01/2013 2030   UROBILINOGEN 0.2 05/15/2018 1516   NITRITE NEGATIVE 05/15/2018 1516   LEUKOCYTESUR NEGATIVE 05/15/2018 1516   Sepsis Labs: Invalid input(s): PROCALCITONIN, LACTICIDVEN  Recent Results (from the past 240 hour(s))  Urine Culture     Status: None   Collection Time: 05/15/18  3:16 PM  Result Value Ref Range Status  MICRO NUMBER: [id number]  Final   SPECIMEN QUALITY: Adequate  Final   Sample Source URINE  Final   STATUS: FINAL  Final   Result:   Final    Multiple organisms present, each less than 10,000 CFU/mL. These organisms, commonly found on external and internal genitalia, are considered to be colonizers. No further testing performed.  SARS Coronavirus 2 (CEPHEID - Performed in [place] hospital lab), Hosp Order     Status: None   Collection Time: 05/16/18  9:23 PM  Result Value Ref Range Status   SARS Coronavirus 2 NEGATIVE NEGATIVE Final    Comment: (NOTE) If result is NEGATIVE SARS-CoV-2 target nucleic acids are NOT DETECTED. The SARS-CoV-2 RNA is generally detectable in upper and lower  respiratory specimens during the acute phase of infection. The lowest  concentration of SARS-CoV-2 viral copies this assay can detect is 250  copies / mL. A negative result does not preclude SARS-CoV-2 infection  and should not be used as the sole basis for treatment or other  patient management decisions.  A negative result may occur with  improper specimen collection / handling, submission of specimen other  than nasopharyngeal swab, presence of viral mutation(s) within the  areas targeted by this assay, and inadequate number of viral copies  (<250 copies / mL). A negative result must be combined with clinical  observations, patient history, and epidemiological information. If  result is POSITIVE SARS-CoV-2 target nucleic acids are DETECTED. The SARS-CoV-2 RNA is generally detectable in upper and lower  respiratory specimens dur ing the acute phase of infection.  Positive  results are indicative of active infection with SARS-CoV-2.  Clinical  correlation with patient history and other diagnostic information is  necessary to determine patient infection status.  Positive results do  not rule out bacterial infection or co-infection with other viruses. If result is PRESUMPTIVE POSTIVE SARS-CoV-2 nucleic acids MAY BE PRESENT.   A presumptive positive result was obtained on the submitted specimen  and confirmed on repeat testing.  While 2019 novel coronavirus  (SARS-CoV-2) nucleic acids may be present in the submitted sample  additional confirmatory testing may be necessary for epidemiological  and / or clinical management purposes  to differentiate between  SARS-CoV-2 and other Sarbecovirus currently known to infect humans.  If clinically indicated additional testing with an alternate test  methodology [id number]) is advised. The SARS-CoV-2 RNA is generally  detectable in upper and lower respiratory sp ecimens during the acute  phase of infection. The expected result is Negative. Fact Sheet for Patients:  StrictlyIdeas.no Fact Sheet for Healthcare Providers: BankingDealers.co.za This test is not yet approved or cleared by the [place] FDA and has been authorized for detection and/or diagnosis of SARS-CoV-2 by FDA under an Emergency Use Authorization (EUA).  This EUA will remain in effect (meaning this test can be used) for the duration of the COVID-19 declaration under Section 564(b)(1) of the Act, 21 U.S.C. section 360bbb-3(b)(1), unless the authorization is terminated or revoked sooner. Performed at [facility], [place] [address], [place], [place] [address]       Radiology Studies: Dg Chest 2  View  Result Date: 05/16/2018 CLINICAL DATA:  85 year old female with a history of altered mental status EXAM: CHEST - 2 VIEW COMPARISON:  10/12/2014, 09/01/2013 FINDINGS: Cardiomediastinal silhouette unchanged in size and contour. No pneumothorax or pleural effusion. No confluent airspace disease. Coarsened interstitial markings. Calcifications of the aortic arch. No displaced fracture.  Degenerative changes of the spine IMPRESSION: Negative  for acute cardiopulmonary disease Electronically Signed   By: Corrie Mckusick D.O.   On: 05/16/2018 08:03   Mr Brain Wo Contrast  Result Date: 05/16/2018 CLINICAL DATA:  Initial evaluation for acute confusion, memory loss for 1 week. EXAM: MRI HEAD WITHOUT CONTRAST TECHNIQUE: Multiplanar, multiecho pulse sequences of the brain and surrounding structures were obtained without intravenous contrast. COMPARISON:  None available. FINDINGS: Brain: Cerebral volume within normal limits for age. Mild patchy T2/FLAIR hyperintensity within the periventricular and deep white matter both cerebral hemispheres most consistent with chronic small vessel ischemic disease, mild in nature. Large confluent area of restricted diffusion involving the parasagittal left occipital lobe compatible with acute to early subacute left PCA territory infarct. Mild patchy involvement of left thalamus. No associated hemorrhage. Mild localized mass effect without midline shift. No other evidence for acute or subacute ischemia. Gray-white matter differentiation otherwise maintained. No other areas of chronic cortical infarction. No acute or chronic intracranial hemorrhage. No mass lesion, midline shift or mass effect. No hydrocephalus. No extra-axial fluid collection. Normal pituitary gland. Vascular: Major intracranial vascular flow voids are maintained. Skull and upper cervical spine: Craniocervical junction within normal limits. Visualized upper cervical spine normal. Bone marrow signal intensity within  normal limits. No scalp soft tissue abnormality. Sinuses/Orbits: Globes and orbital soft tissues within normal limits. Moderate mucosal thickening noted within the right maxillary sinus, likely chronic. Paranasal sinuses are otherwise largely clear. No mastoid effusion. Inner ear structures normal. Other: None. IMPRESSION: 1. Large acute to early subacute left PCA territory infarct. No associated hemorrhage or significant regional mass effect. 2. Underlying mild for age chronic microvascular ischemic disease. Results were called by telephone at the time of interpretation on 05/16/2018 at 7:14 pm to the physician assistant in the emergency room, [facility], who verbally acknowledged these results. Electronically Signed   By: Jeannine Boga M.D.   On: 05/16/2018 19:15    Marzetta Board, MD, PhD [facility]  Contact via  [email or web address]  [place] P: [phone]  F: [phone]

## 2018-05-17 NOTE — Progress Notes (Signed)
Pt assessed to be confused x3; when asked any questions she replies " I don't know!" follow some commands but jumping out bed; voided to bsc x3 within 2hrs. NP on-call paged and notified; no orders received yet. Will continue to closely monitor pt. Delia Heady RN

## 2018-05-17 NOTE — Progress Notes (Signed)
  Echocardiogram 2D Echocardiogram has been performed.  Kimberly Castillo 05/17/2018, 2:29 PM

## 2018-05-18 LAB — URINALYSIS, ROUTINE W REFLEX MICROSCOPIC
Bilirubin Urine: NEGATIVE
Glucose, UA: NEGATIVE mg/dL
Hgb urine dipstick: NEGATIVE
Ketones, ur: NEGATIVE mg/dL
Leukocytes,Ua: NEGATIVE
Nitrite: NEGATIVE
Protein, ur: 100 mg/dL — AB
Specific Gravity, Urine: 1.009 (ref 1.005–1.030)
pH: 7 (ref 5.0–8.0)

## 2018-05-18 LAB — CBC
HCT: 34.7 % — ABNORMAL LOW (ref 36.0–46.0)
Hemoglobin: 11.3 g/dL — ABNORMAL LOW (ref 12.0–15.0)
MCH: 31.7 pg (ref 26.0–34.0)
MCHC: 32.6 g/dL (ref 30.0–36.0)
MCV: 97.5 fL (ref 80.0–100.0)
Platelets: 214 10*3/uL (ref 150–400)
RBC: 3.56 MIL/uL — ABNORMAL LOW (ref 3.87–5.11)
RDW: 13 % (ref 11.5–15.5)
WBC: 4.9 10*3/uL (ref 4.0–10.5)
nRBC: 0 % (ref 0.0–0.2)

## 2018-05-18 LAB — BASIC METABOLIC PANEL
Anion gap: 8 (ref 5–15)
BUN: 22 mg/dL (ref 8–23)
CO2: 23 mmol/L (ref 22–32)
Calcium: 9 mg/dL (ref 8.9–10.3)
Chloride: 110 mmol/L (ref 98–111)
Creatinine, Ser: 1.71 mg/dL — ABNORMAL HIGH (ref 0.44–1.00)
GFR calc Af Amer: 32 mL/min — ABNORMAL LOW (ref >60)
GFR calc non Af Amer: 27 mL/min — ABNORMAL LOW (ref >60)
Glucose, Bld: 145 mg/dL — ABNORMAL HIGH (ref 70–99)
Potassium: 3.1 mmol/L — ABNORMAL LOW (ref 3.5–5.1)
Sodium: 141 mmol/L (ref 135–145)

## 2018-05-18 LAB — GLUCOSE, CAPILLARY
Glucose-Capillary: 86 mg/dL (ref 70–99)
Glucose-Capillary: 91 mg/dL (ref 70–99)
Glucose-Capillary: 148 mg/dL — ABNORMAL HIGH (ref 70–99)
Glucose-Capillary: 89 mg/dL (ref 70–99)

## 2018-05-18 MED ORDER — ASPIRIN EC 325 MG PO TBEC
325.0000 mg | DELAYED_RELEASE_TABLET | Freq: Every day | ORAL | Status: DC
  Administered 2018-05-19: 325 mg via ORAL
  Filled 2018-05-18: qty 1

## 2018-05-18 MED ORDER — POTASSIUM CHLORIDE CRYS ER 20 MEQ PO TBCR
40.0000 meq | EXTENDED_RELEASE_TABLET | Freq: Once | ORAL | Status: AC
  Administered 2018-05-18: 21:00:00 40 meq via ORAL
  Filled 2018-05-18: qty 2

## 2018-05-18 MED ORDER — METOPROLOL TARTRATE 25 MG PO TABS
25.0000 mg | ORAL_TABLET | Freq: Two times a day (BID) | ORAL | Status: DC
  Administered 2018-05-18 – 2018-05-19 (×2): 25 mg via ORAL
  Filled 2018-05-18 (×2): qty 1

## 2018-05-18 NOTE — Progress Notes (Signed)
STROKE TEAM PROGRESS NOTE   SUBJECTIVE (INTERVAL HISTORY) Pt lying in bed in sleeping, easily arousable. Still has confusion and perseveration. Plan loop recorder in am if no evidence of afib found.    OBJECTIVE Vitals:   05/17/18 2017 05/17/18 2323 05/18/18 0442 05/18/18 0735  BP: (!) 165/111 (!) 180/98 (!) 188/79 (!) 194/70  Pulse: 91 79 68 67  Resp: 18 18 18    Temp: 98.4 F (36.9 C) 98.7 F (37.1 C) 98.4 F (36.9 C) 98.7 F (37.1 C)  TempSrc: Oral Oral Oral Oral  SpO2: 98% 100% 97% 99%  Weight:      Height:        CBC:  Recent Labs  Lab 05/15/18 1328 05/16/18 1919 05/16/18 1928  WBC 5.9 5.9  --   NEUTROABS 3.5 3.2  --   HGB 12.1 11.7* 12.2  HCT 36.1 36.5 36.0  MCV 97.6 99.2  --   PLT 201.0 212  --     Basic Metabolic Panel:  Recent Labs  Lab 05/15/18 1328 05/16/18 1919 05/16/18 1928  NA 141 142 142  K 3.7 3.4* 3.3*  CL 106 108 109  CO2 26 23  --   GLUCOSE 190* 91 93  BUN 32* 31* 30*  CREATININE 2.09* 1.93* 2.00*  CALCIUM 9.2 9.6  --   MG 1.8  --   --     Lipid Panel:     Component Value Date/Time   CHOL 211 (H) 05/15/2018 1328   TRIG 198.0 (H) 05/15/2018 1328   HDL 47.50 05/15/2018 1328   CHOLHDL 4 05/15/2018 1328   VLDL 39.6 05/15/2018 1328   LDLCALC 124 (H) 05/15/2018 1328   HgbA1c:  Lab Results  Component Value Date   HGBA1C 6.4 05/15/2018   Urine Drug Screen: No results found for: LABOPIA, COCAINSCRNUR, LABBENZ, AMPHETMU, THCU, LABBARB  Alcohol Level No results found for: [facility]  IMAGING  Dg Chest 2 View 05/16/2018 IMPRESSION:  Negative for acute cardiopulmonary disease.   Mr Brain Wo Contrast 05/16/2018 IMPRESSION:  1. Large acute to early subacute left PCA territory infarct. No associated hemorrhage or significant regional mass effect.  2. Underlying mild for age chronic microvascular ischemic disease.   MRA Head  1. Focal occlusion of the left PCA corresponding to the acute/subacute infarct. 2. No other large vessel  occlusion. 3. Mild vessel irregularity without significant focal stenosis.  Transthoracic Echocardiogram  Pending   Bilateral Carotid Dopplers  Right Carotid: Velocities in the right ICA are consistent with a 1-39% stenosis. Significant plaque noted. Cannot exclude higher stenosis secondary to body habitus limitation and depth of vessels.  Left Carotid: Velocities in the left ICA are consistent with a 40-59% stenosis. Significant plaque noted. Cannot exclude higher stenosis secondary to body habitus limitation and depth of vessels.  Vertebrals:  Bilateral vertebral arteries demonstrate antegrade flow. Subclavians: Normal flow hemodynamics were seen in bilateral subclavian arteries.   EKG - SR rate 70 BPM. (See cardiology reading for complete details)    PHYSICAL EXAM  Temp:  [98.1 F (36.7 C)-98.7 F (37.1 C)] 98.7 F (37.1 C) (05/17 0735) Pulse Rate:  [66-91] 67 (05/17 0735) Resp:  [16-18] 18 (05/17 0442) BP: (165-195)/(70-111) 194/70 (05/17 0735) SpO2:  [97 %-100 %] 99 % (05/17 0735)  General - Well nourished, well developed, in no apparent distress.  Ophthalmologic - fundi not visualized due to noncooperation.  Cardiovascular - Regular rate and rhythm.  Mental Status -  Awake, alert but not orientation to time, place, or  person. Follows simple commands, fluent speech however perseverated on finger naming.  Anomia, able to repeat simple sentences but not a complex sentences.  Cranial Nerves II - XII - II - right hemianopia. III, IV, VI - Extraocular movements intact. V - Facial sensation intact bilaterally. VII - Facial movement intact bilaterally. VIII - Hearing & vestibular intact bilaterally. X - Palate elevates symmetrically. XI - Chin turning & shoulder shrug intact bilaterally. XII - Tongue protrusion intact.  Motor Strength - The patient's strength was symmetrical in all extremities and pronator drift was absent.  Bulk was normal and fasciculations were  absent.   Motor Tone - Muscle tone was assessed at the neck and appendages and was normal.  Reflexes - The patient's reflexes were symmetrical in all extremities and she had no pathological reflexes.  Sensory - Light touch, temperature/pinprick were assessed and were symmetrical.    Coordination - The patient had normal movements in the hands with no ataxia or dysmetria.  Tremor was absent.  Gait and Station - deferred.   ASSESSMENT/PLAN Kimberly Castillo is a 85 y.o. female with history of atrial fibrillation not on anticoagulation, diabetes mellitus, hypertension, migraines and hyperlipidemia  presenting with AMS and left sided headache. She did not receive IV t-PA due to late presentation (>4.5 hours from time of onset)  Stroke: Large acute to early subacute left PCA territory infarct including left occipital lobe and left thalamus - embolic pattern, concerning for cardioembolic source.  Resultant right hemianopia, disorientation  MRI head - Large acute to early subacute left PCA territory infarct.  MRA head - left distal PCA occlusion  Carotid Doppler - left ICA 40 to 59% stenosis  2D Echo - EF 60-65%  LDL - 124  HgbA1c - 6.4  VTE prophylaxis - SCDs   Diet  - Carb modified with thin liquids  aspirin 81 mg daily prior to admission, now on aspirin 325 mg and plavix 75 mg daily.  Continue DAPT for 3 months and then Plavix alone given intracranial high grade stenosis/occlusion.  Patient counseled to be compliant with her antithrombotic medications  Ongoing aggressive stroke risk factor management  Therapy recommendations:  HH PT  Disposition:  Pending  ??  A. Fib  A. fib was listed as PMH  However there is no previous record of A. Fib  All EKGs were in sinus  EKG - repeat pending  Discussed with EP Dr. Rayann Heman and will consider loop recorder if no evidence of A. fib.  Hypertension  BP on the high end  Resume Cardizem  Add metoprolol 25mg  bid  Consider  gradually normalize BP in 2 to 3 days . Long-term BP goal normotensive   Hyperlipidemia  Lipid lowering medication PTA:  Zocor 40 mg daily  LDL 124, goal < 70  Current lipid lowering medication: Lipitor 80 mg daily  Continue statin at discharge  Diabetes  HgbA1c 6.4, goal < 7.0  Controlled  SSI  CBG monitoring  PCP follow up closely  metformin on hold due to elevated Cre  Other Stroke Risk Factors  Advanced age  Former cigarette smoker - quit 22 years ago  Atrial fibrillation without anticoagulation  Migraines  Other Active Problems  Hypokalemia - 3.3  CKD stage III with creatinine - 2.0  Hospital day # 1  Rosalin Hawking, MD PhD Stroke Neurology 05/18/2018 6:25 PM   To contact Stroke Continuity provider, please refer to [email or web address] After hours, contact General Neurology

## 2018-05-18 NOTE — Progress Notes (Signed)
Pt potassium noted to be 3.1. NP on-call notified and new order received. Will continue to closely monitor. Delia Heady RN

## 2018-05-18 NOTE — Progress Notes (Signed)
PROGRESS NOTE  Kimberly MCCLARAN XKG:[id number] DOB: 1933/06/24 DOA: 05/16/2018 PCP: Biagio Borg, MD   LOS: 1 day   Brief Narrative / Interim history: 85 year old female with reported history of A. fib, type 2 diabetes mellitus, hyperlipidemia, brought to the hospital by family after a course of about 6 days of altered mental status.  Patient has some short-term memory loss and cannot recall much, but apparently per her son she has been a little bit more confused as well as sleepy.  Patient is aware that she is in the hospital, knows that she is having difficulties recollecting events from the past several days and is somewhat frustrated by that.  She was evaluated as an outpatient and an MRI of the brain showed early subacute left PCA territory infarct.  Patient was brought to the ED for further evaluation and management.  Neurology was consulted on admission.  Subjective: Remains quite confused this morning.  She does not remember me from yesterday.  She was quite surprised when she learned that she had a stroke.  Very frustrated about her memory problems  Assessment & Plan: Principal Problem:   CVA (cerebral vascular accident) ([place]) Active Problems:   DM type 2 (diabetes mellitus, type 2) ([place])   Hypertension   Hyperlipemia   GERD (gastroesophageal reflux disease)   A-fib (HCC)   CKD (chronic kidney disease), stage III (HCC)   Noncompliance   Vitamin D deficiency   Abnormal EKG   Headache   Stroke (cerebrum) (HCC)   Principal Problem Acute stroke -MRI of the head prior to admission showed large acute to early subacute left PCA territory infarct. Neurology was consulted and following patient -2D echo with EF 60-65%, has a degree of diastolic dysfunction. ? PFO -Continue aspirin, Plavix and statin per neurology -Carotids 1-39% on the right, 40-59% on left -A1c 6.4 suggesting prediabetes.  LDL 124. -PT recommends home health PT  Active Problems Hypertension -Allow permissive  hypertension.  Stop IV fluids today  Hyperlipidemia -Continue statin  Type 2 diabetes mellitus -Continue sliding scale  ?  History of A. fib -Patient's diagnosis of A. fib dates back as far as I could see in 2014.  I do not see a documented EKG during this admission nor in the past which confirms the A. Fib.  Telemetry here shows sinus rhythm.  Patient unaware of A. fib, discussed with son over the phone he is also unaware of this.  I am not sure when was her A. fib and under what conditions  Chronic kidney disease stage IV -Creatinine around 1.8-1.9 in the last 12 months, currently 2.0 which is close to baseline.  Continue to avoid nephrotoxic drugs  Scheduled Meds:  aspirin EC  81 mg Oral Daily   atorvastatin  80 mg Oral q1800   clopidogrel  75 mg Oral Daily   diltiazem  120 mg Oral Daily   insulin aspart  0-5 Units Subcutaneous QHS   insulin aspart  0-9 Units Subcutaneous TID WC   latanoprost  1 drop Both Eyes QHS   sodium chloride flush  3 mL Intravenous Once   timolol  1 drop Both Eyes Daily   Continuous Infusions:  PRN Meds:.  DVT prophylaxis: SCDs Code Status: Full code Family Communication: d/w son over the phone Disposition Plan: TBD  Consultants:   Neurology   Procedures:   2D echo:  IMPRESSIONS  1. The left ventricle has normal systolic function with an ejection fraction of 60-65%. The cavity size was normal.  Left ventricular diastolic Doppler parameters are consistent with impaired relaxation. Elevated left ventricular end-diastolic pressure.  2. The right ventricle has normal systolic function. The cavity was normal. There is no increase in right ventricular wall thickness.  3. Mild calcification of the anterior mitral valve leaflet. There is mild mitral annular calcification present.  4. The interatrial septum appears to be lipomatous.R 5. Recommend agitated saline contrast study to fruleo ut PFO  Antimicrobials:  None    Objective: Vitals:     05/17/18 2323 05/18/18 0442 05/18/18 0735 05/18/18 1204  BP: (!) 180/98 (!) 188/79 (!) 194/70 (!) 229/88  Pulse: 79 68 67 77  Resp: 18 18 17  (!) 21  Temp: 98.7 F (37.1 C) 98.4 F (36.9 C) 98.7 F (37.1 C) 98.9 F (37.2 C)  TempSrc: Oral Oral Oral Oral  SpO2: 100% 97% 99% 99%  Weight:      Height:        Intake/Output Summary (Last 24 hours) at 05/18/2018 1301 Last data filed at 05/18/2018 1049 Gross per 24 hour  Intake 3463.45 ml  Output --  Net 3463.45 ml   Filed Weights   05/16/18 1844 05/16/18 1845  Weight: 61.2 kg 61.2 kg    Examination:  Constitutional: No distress, sitting at the edge of the bed Eyes: No scleral icterus ENMT: Moist mucous membranes Neck: normal, supple Respiratory: Clear to auscultation bilaterally without wheezing or crackles Cardiovascular: Regular rate and rhythm, no murmurs appreciated.  No edema Abdomen: Soft, nontender, nondistended, positive bowel sounds Musculoskeletal: no clubbing / cyanosis.  Skin: No new rashes Neurologic: Equal strength  Data Reviewed: I have independently reviewed following labs and imaging studies   CBC: Recent Labs  Lab 05/15/18 1328 05/16/18 1919 05/16/18 1928  WBC 5.9 5.9  --   NEUTROABS 3.5 3.2  --   HGB 12.1 11.7* 12.2  HCT 36.1 36.5 36.0  MCV 97.6 99.2  --   PLT 201.0 212  --    Basic Metabolic Panel: Recent Labs  Lab 05/15/18 1328 05/16/18 1919 05/16/18 1928  NA 141 142 142  K 3.7 3.4* 3.3*  CL 106 108 109  CO2 26 23  --   GLUCOSE 190* 91 93  BUN 32* 31* 30*  CREATININE 2.09* 1.93* 2.00*  CALCIUM 9.2 9.6  --   MG 1.8  --   --    GFR: Estimated Creatinine Clearance: 18.3 mL/min (A) (by C-G formula based on SCr of 2 mg/dL (H)). Liver Function Tests: Recent Labs  Lab 05/15/18 1328 05/16/18 1919  AST 18 25  ALT 12 18  ALKPHOS 66 74  BILITOT 0.6 0.5  PROT 6.8 7.2  ALBUMIN 3.4* 3.5   No results for input(s): LIPASE, AMYLASE in the last 168 hours. No results for input(s):  AMMONIA in the last 168 hours. Coagulation Profile: Recent Labs  Lab 05/16/18 1919  INR 1.1   Cardiac Enzymes: Recent Labs  Lab 05/16/18 2347 05/17/18 0514 05/17/18 1138  TROPONINI 0.04* 0.03* 0.04*   BNP (last 3 results) No results for input(s): PROBNP in the last 8760 hours. HbA1C: Recent Labs    05/15/18 1328  HGBA1C 6.4   CBG: Recent Labs  Lab 05/17/18 1216 05/17/18 1543 05/17/18 2117 05/18/18 0639 05/18/18 1148  GLUCAP 107* 85 87 86 91   Lipid Profile: Recent Labs    05/15/18 1328  CHOL 211*  HDL 47.50  LDLCALC 124*  TRIG 198.0*  CHOLHDL 4   Thyroid Function Tests: Recent Labs    05/15/18  1328  TSH 1.06   Anemia Panel: Recent Labs    05/15/18 1328  VITAMINB12 363   Urine analysis:    Component Value Date/Time   COLORURINE STRAW (A) 05/17/2018 2213   APPEARANCEUR CLEAR 05/17/2018 2213   LABSPEC 1.009 05/17/2018 2213   PHURINE 7.0 05/17/2018 2213   GLUCOSEU NEGATIVE 05/17/2018 2213   GLUCOSEU NEGATIVE 05/15/2018 1516   [place] 05/17/2018 2213   BILIRUBINUR NEGATIVE 05/17/2018 2213   [place] 05/17/2018 2213   PROTEINUR 100 (A) 05/17/2018 2213   UROBILINOGEN 0.2 05/15/2018 1516   NITRITE NEGATIVE 05/17/2018 2213   LEUKOCYTESUR NEGATIVE 05/17/2018 2213   Sepsis Labs: Invalid input(s): PROCALCITONIN, LACTICIDVEN  Recent Results (from the past 240 hour(s))  Urine Culture     Status: None   Collection Time: 05/15/18  3:16 PM  Result Value Ref Range Status   MICRO NUMBER: [id number]  Final   SPECIMEN QUALITY: Adequate  Final   Sample Source URINE  Final   STATUS: FINAL  Final   Result:   Final    Multiple organisms present, each less than 10,000 CFU/mL. These organisms, commonly found on external and internal genitalia, are considered to be colonizers. No further testing performed.  SARS Coronavirus 2 (CEPHEID - Performed in [place] hospital lab), Hosp Order     Status: None   Collection Time: 05/16/18  9:23 PM    Result Value Ref Range Status   SARS Coronavirus 2 NEGATIVE NEGATIVE Final    Comment: (NOTE) If result is NEGATIVE SARS-CoV-2 target nucleic acids are NOT DETECTED. The SARS-CoV-2 RNA is generally detectable in upper and lower  respiratory specimens during the acute phase of infection. The lowest  concentration of SARS-CoV-2 viral copies this assay can detect is 250  copies / mL. A negative result does not preclude SARS-CoV-2 infection  and should not be used as the sole basis for treatment or other  patient management decisions.  A negative result may occur with  improper specimen collection / handling, submission of specimen other  than nasopharyngeal swab, presence of viral mutation(s) within the  areas targeted by this assay, and inadequate number of viral copies  (<250 copies / mL). A negative result must be combined with clinical  observations, patient history, and epidemiological information. If result is POSITIVE SARS-CoV-2 target nucleic acids are DETECTED. The SARS-CoV-2 RNA is generally detectable in upper and lower  respiratory specimens dur ing the acute phase of infection.  Positive  results are indicative of active infection with SARS-CoV-2.  Clinical  correlation with patient history and other diagnostic information is  necessary to determine patient infection status.  Positive results do  not rule out bacterial infection or co-infection with other viruses. If result is PRESUMPTIVE POSTIVE SARS-CoV-2 nucleic acids MAY BE PRESENT.   A presumptive positive result was obtained on the submitted specimen  and confirmed on repeat testing.  While 2019 novel coronavirus  (SARS-CoV-2) nucleic acids may be present in the submitted sample  additional confirmatory testing may be necessary for epidemiological  and / or clinical management purposes  to differentiate between  SARS-CoV-2 and other Sarbecovirus currently known to infect humans.  If clinically indicated additional  testing with an alternate test  methodology [phone]) is advised. The SARS-CoV-2 RNA is generally  detectable in upper and lower respiratory sp ecimens during the acute  phase of infection. The expected result is Negative. Fact Sheet for Patients:  StrictlyIdeas.no Fact Sheet for Healthcare Providers: BankingDealers.co.za This test is not yet  approved or cleared by the [place] and has been authorized for detection and/or diagnosis of SARS-CoV-2 by FDA under an Emergency Use Authorization (EUA).  This EUA will remain in effect (meaning this test can be used) for the duration of the COVID-19 declaration under Section 564(b)(1) of the Act, 21 U.S.C. section 360bbb-3(b)(1), unless the authorization is terminated or revoked sooner. Performed at [facility], [place] [address] [place], [place] [address]       Radiology Studies: Mr Virgel Paling KF Contrast  Result Date: 05/17/2018 CLINICAL DATA:  Acute/subacute left PCA territory infarct. EXAM: MRA HEAD WITHOUT CONTRAST TECHNIQUE: Angiographic images of the Circle of Willis were obtained using MRA technique without intravenous contrast. COMPARISON:  MRI brain 05/16/2018 FINDINGS: The internal carotid arteries are within normal limits from the high cervical segments through the ICA termini bilaterally. The A1 and M1 segments are normal. No definite anterior communicating artery is present. MCA bifurcations are intact. ACA and MCA branch vessels are within normal limits bilaterally. The vertebral arteries are symmetric. Basilar artery is normal. Both posterior cerebral arteries originate from the basilar tip. There is a focal occlusion of the left P2 segment prior to the bifurcation. Right PCA branch vessels are within normal limits. IMPRESSION: 1. Focal occlusion of the left PCA corresponding to the acute/subacute infarct. 2. No other large vessel occlusion. 3. Mild vessel irregularity without  significant focal stenosis. Electronically Signed   By: San Morelle M.D.   On: 05/17/2018 15:53   Mr Brain Wo Contrast  Result Date: 05/16/2018 CLINICAL DATA:  Initial evaluation for acute confusion, memory loss for 1 week. EXAM: MRI HEAD WITHOUT CONTRAST TECHNIQUE: Multiplanar, multiecho pulse sequences of the brain and surrounding structures were obtained without intravenous contrast. COMPARISON:  None available. FINDINGS: Brain: Cerebral volume within normal limits for age. Mild patchy T2/FLAIR hyperintensity within the periventricular and deep white matter both cerebral hemispheres most consistent with chronic small vessel ischemic disease, mild in nature. Large confluent area of restricted diffusion involving the parasagittal left occipital lobe compatible with acute to early subacute left PCA territory infarct. Mild patchy involvement of left thalamus. No associated hemorrhage. Mild localized mass effect without midline shift. No other evidence for acute or subacute ischemia. Gray-white matter differentiation otherwise maintained. No other areas of chronic cortical infarction. No acute or chronic intracranial hemorrhage. No mass lesion, midline shift or mass effect. No hydrocephalus. No extra-axial fluid collection. Normal pituitary gland. Vascular: Major intracranial vascular flow voids are maintained. Skull and upper cervical spine: Craniocervical junction within normal limits. Visualized upper cervical spine normal. Bone marrow signal intensity within normal limits. No scalp soft tissue abnormality. Sinuses/Orbits: Globes and orbital soft tissues within normal limits. Moderate mucosal thickening noted within the right maxillary sinus, likely chronic. Paranasal sinuses are otherwise largely clear. No mastoid effusion. Inner ear structures normal. Other: None. IMPRESSION: 1. Large acute to early subacute left PCA territory infarct. No associated hemorrhage or significant regional mass effect. 2.  Underlying mild for age chronic microvascular ischemic disease. Results were called by telephone at the time of interpretation on 05/16/2018 at 7:14 pm to the physician assistant in the emergency room, [facility], who verbally acknowledged these results. Electronically Signed   By: Jeannine Boga M.D.   On: 05/16/2018 19:15   Vas US Carotid  Result Date: 05/17/2018 Carotid Arterial Duplex Study Indications:       CVA and Altered mental status. Risk Factors:      Hypertension, hyperlipidemia, Diabetes. Other Factors:  Atrial fibrillation. Limitations:       Depth of vessels Comparison Study:  No prior study on file for comparison Performing Technologist: Sharion Dove RVS  Examination Guidelines: A complete evaluation includes B-mode imaging, spectral Doppler, color Doppler, and power Doppler as needed of all accessible portions of each vessel. Bilateral testing is considered an integral part of a complete examination. Limited examinations for reoccurring indications may be performed as noted.  Right Carotid Findings: +----------+--------+--------+--------+----------------------+--------+             PSV cm/s EDV cm/s Stenosis Describe               Comments  +----------+--------+--------+--------+----------------------+--------+  CCA Prox   75       17                heterogenous                     +----------+--------+--------+--------+----------------------+--------+  CCA Distal 91       23                homogeneous                      +----------+--------+--------+--------+----------------------+--------+  ICA Prox   34       11                irregular and calcific           +----------+--------+--------+--------+----------------------+--------+  ICA Distal 111      21                                                 +----------+--------+--------+--------+----------------------+--------+  ECA        181      20                                                  +----------+--------+--------+--------+----------------------+--------+ +----------+--------+-------+--------+-------------------+             PSV cm/s EDV cms Describe Arm Pressure (mmHG)  +----------+--------+-------+--------+-------------------+  Subclavian 147                                            +----------+--------+-------+--------+-------------------+ +---------+--------+--+--------+--+  Vertebral PSV cm/s 70 EDV cm/s 16  +---------+--------+--+--------+--+  Left Carotid Findings: +----------+--------+--------+--------+----------------------+--------+             PSV cm/s EDV cm/s Stenosis Describe               Comments  +----------+--------+--------+--------+----------------------+--------+  CCA Prox   71       17                homogeneous                      +----------+--------+--------+--------+----------------------+--------+  CCA Distal 60       21                homogeneous                      +----------+--------+--------+--------+----------------------+--------+  ICA Prox   262      62                calcific and irregular           +----------+--------+--------+--------+----------------------+--------+  ICA Distal 86       16                                                 +----------+--------+--------+--------+----------------------+--------+  ECA        67       14                                                 +----------+--------+--------+--------+----------------------+--------+ +----------+--------+--------+--------+-------------------+  Subclavian PSV cm/s EDV cm/s Describe Arm Pressure (mmHG)  +----------+--------+--------+--------+-------------------+             59                                              +----------+--------+--------+--------+-------------------+ +---------+--------+--+--------+--+  Vertebral PSV cm/s 67 EDV cm/s 17  +---------+--------+--+--------+--+  Summary: Right Carotid: Velocities in the right ICA are consistent with a 1-39% stenosis.                 Significant plaque noted. Cannot exclude higher stenosis                secondary to body habitus limitation and depth of vessels. Left Carotid: Velocities in the left ICA are consistent with a 40-59% stenosis.               Significant plaque noted. Cannot exclude higher stenosis secondary               to body habitus limitation and depth of vessels. Vertebrals:  Bilateral vertebral arteries demonstrate antegrade flow. Subclavians: Normal flow hemodynamics were seen in bilateral subclavian              arteries. *See table(s) above for measurements and observations.     Preliminary     Marzetta Board, MD, PhD [facility]  Contact via  [email or web address]  [place] P: [phone]  F: [phone]

## 2018-05-18 NOTE — Progress Notes (Signed)
Pt woke up this am A&O x3; keeps on asking what happened to her last night because she don't recall what happened to her. Pt more pleasant and responsive this am. Pt back to bed with call light within reach; will closely monitor. Delia Heady RN

## 2018-05-19 ENCOUNTER — Telehealth: Payer: Self-pay | Admitting: Internal Medicine

## 2018-05-19 ENCOUNTER — Encounter (HOSPITAL_COMMUNITY)
Admission: EM | Disposition: A | Discharge status: Home Health | Payer: Self-pay | Source: Home / Self Care | Attending: Internal Medicine

## 2018-05-19 DIAGNOSIS — N183 Chronic kidney disease, stage 3 (moderate): Secondary | ICD-10-CM

## 2018-05-19 DIAGNOSIS — E1159 Type 2 diabetes mellitus with other circulatory complications: Secondary | ICD-10-CM

## 2018-05-19 DIAGNOSIS — I6389 Other cerebral infarction: Secondary | ICD-10-CM

## 2018-05-19 HISTORY — PX: LOOP RECORDER INSERTION: EP1214

## 2018-05-19 LAB — GLUCOSE, CAPILLARY
Glucose-Capillary: 88 mg/dL (ref 70–99)
Glucose-Capillary: 112 mg/dL — ABNORMAL HIGH (ref 70–99)

## 2018-05-19 LAB — CBC
HCT: 34.4 % — ABNORMAL LOW (ref 36.0–46.0)
Hemoglobin: 11.5 g/dL — ABNORMAL LOW (ref 12.0–15.0)
MCH: 32.7 pg (ref 26.0–34.0)
MCHC: 33.4 g/dL (ref 30.0–36.0)
MCV: 97.7 fL (ref 80.0–100.0)
Platelets: 236 10*3/uL (ref 150–400)
RBC: 3.52 MIL/uL — ABNORMAL LOW (ref 3.87–5.11)
RDW: 13 % (ref 11.5–15.5)
WBC: 5.3 10*3/uL (ref 4.0–10.5)
nRBC: 0 % (ref 0.0–0.2)

## 2018-05-19 LAB — BASIC METABOLIC PANEL
Anion gap: 8 (ref 5–15)
BUN: 20 mg/dL (ref 8–23)
CO2: 20 mmol/L — ABNORMAL LOW (ref 22–32)
Calcium: 9.2 mg/dL (ref 8.9–10.3)
Chloride: 112 mmol/L — ABNORMAL HIGH (ref 98–111)
Creatinine, Ser: 1.62 mg/dL — ABNORMAL HIGH (ref 0.44–1.00)
GFR calc Af Amer: 34 mL/min — ABNORMAL LOW (ref >60)
GFR calc non Af Amer: 29 mL/min — ABNORMAL LOW (ref >60)
Glucose, Bld: 90 mg/dL (ref 70–99)
Potassium: 3.8 mmol/L (ref 3.5–5.1)
Sodium: 140 mmol/L (ref 135–145)

## 2018-05-19 SURGERY — LOOP RECORDER INSERTION
Anesthesia: LOCAL

## 2018-05-19 MED ORDER — METOPROLOL TARTRATE 25 MG PO TABS: 25.0000 mg | ORAL_TABLET | Freq: Two times a day (BID) | ORAL | 1 refills | Status: DC

## 2018-05-19 MED ORDER — LIDOCAINE-EPINEPHRINE 1 %-1:100000 IJ SOLN
INTRAMUSCULAR | Status: AC
  Filled 2018-05-19: qty 1

## 2018-05-19 MED ORDER — CLOPIDOGREL BISULFATE 75 MG PO TABS: 75.0000 mg | ORAL_TABLET | Freq: Every day | ORAL | 1 refills | Status: DC

## 2018-05-19 MED ORDER — ATORVASTATIN CALCIUM 80 MG PO TABS: 80.0000 mg | ORAL_TABLET | Freq: Every day | ORAL | 1 refills | Status: DC

## 2018-05-19 MED ORDER — ONDANSETRON HCL 4 MG/2ML IJ SOLN: 4.0000 mg | Freq: Four times a day (QID) | INTRAMUSCULAR | Status: DC | PRN

## 2018-05-19 MED ORDER — ASPIRIN 325 MG PO TBEC: 325.0000 mg | DELAYED_RELEASE_TABLET | Freq: Every day | ORAL | 0 refills | Status: DC

## 2018-05-19 MED ORDER — LIDOCAINE-EPINEPHRINE 1 %-1:100000 IJ SOLN
INTRAMUSCULAR | Status: DC | PRN
  Administered 2018-05-19: 20 mL

## 2018-05-19 MED ORDER — ACETAMINOPHEN 325 MG PO TABS: 325.0000 mg | ORAL_TABLET | ORAL | Status: DC | PRN

## 2018-05-19 SURGICAL SUPPLY — 2 items
LOOP REVEAL LINQSYS (Prosthesis & Implant Heart) ×2 IMPLANT
PACK LOOP INSERTION (CUSTOM PROCEDURE TRAY) ×2 IMPLANT

## 2018-05-19 NOTE — Discharge Instructions (Signed)
Implant site care Keep incision clean and dry for 3 days. You can remove outer dressing tomorrow. Leave steri-strips (little pieces of tape) on until seen in the office for wound check appointment. Call the office [phone]) for redness, drainage, swelling, or fever.  Please take aspirin and Plavix for 3 months, then Plavix alone.  Follow-up with neurology within 1 to 2 months   Follow with Kimberly Borg, MD in 5-7 days  Please get a complete blood count and chemistry panel checked by your Primary MD at your next visit, and again as instructed by your Primary MD. Please get your medications reviewed and adjusted by your Primary MD.  Please request your Primary MD to go over all Hospital Tests and Procedure/Radiological results at the follow up, please get all Hospital records sent to your Prim MD by signing hospital release before you go home.  In some cases, there will be blood work, cultures and biopsy results pending at the time of your discharge. Please request that your primary care M.D. goes through all the records of your hospital data and follows up on these results.  If you had Pneumonia of Lung problems at the Hospital: Please get a 2 view Chest X ray done in 6-8 weeks after hospital discharge or sooner if instructed by your Primary MD.  If you have Congestive Heart Failure: Please call your Cardiologist or Primary MD anytime you have any of the following symptoms:  1) 3 pound weight gain in 24 hours or 5 pounds in 1 week  2) shortness of breath, with or without a dry hacking cough  3) swelling in the hands, feet or stomach  4) if you have to sleep on extra pillows at night in order to breathe  Follow cardiac low salt diet and 1.5 lit/day fluid restriction.  If you have diabetes Accuchecks 4 times/day, Once in AM empty stomach and then before each meal. Log in all results and show them to your primary doctor at your next visit. If any glucose reading is under 80 or above 300  call your primary MD immediately.  If you have Seizure/Convulsions/Epilepsy: Please do not drive, operate heavy machinery, participate in activities at heights or participate in high speed sports until you have seen by Primary MD or a Neurologist and advised to do so again. Per [facility] statutes, patients with seizures are not allowed to drive until they have been seizure-free for six months.  Use caution when using heavy equipment or power tools. Avoid working on ladders or at heights. Take showers instead of baths. Ensure the water temperature is not too high on the home water heater. Do not go swimming alone. Do not lock yourself in a room alone (i.e. bathroom). When caring for infants or small children, sit down when holding, feeding, or changing them to minimize risk of injury to the child in the event you have a seizure. Maintain good sleep hygiene. Avoid alcohol.   If you had Gastrointestinal Bleeding: Please ask your Primary MD to check a complete blood count within one week of discharge or at your next visit. Your endoscopic/colonoscopic biopsies that are pending at the time of discharge, will also need to followed by your Primary MD.  Get Medicines reviewed and adjusted. Please take all your medications with you for your next visit with your Primary MD  Please request your Primary MD to go over all hospital tests and procedure/radiological results at the follow up, please ask your Primary MD to  get all Hospital records sent to his/her office.  If you experience worsening of your admission symptoms, develop shortness of breath, life threatening emergency, suicidal or homicidal thoughts you must seek medical attention immediately by calling 911 or calling your MD immediately  if symptoms less severe.  You must read complete instructions/literature along with all the possible adverse reactions/side effects for all the Medicines you take and that have been prescribed to you. Take any  new Medicines after you have completely understood and accpet all the possible adverse reactions/side effects.   Do not drive or operate heavy machinery when taking Pain medications.   Do not take more than prescribed Pain, Sleep and Anxiety Medications  Special Instructions: If you have smoked or chewed Tobacco  in the last 2 yrs please stop smoking, stop any regular Alcohol  and or any Recreational drug use.  Wear Seat belts while driving.  Please note You were cared for by a hospitalist during your hospital stay. If you have any questions about your discharge medications or the care you received while you were in the hospital after you are discharged, you can call the unit and asked to speak with the hospitalist on call if the hospitalist that took care of you is not available. Once you are discharged, your primary care physician will handle any further medical issues. Please note that NO REFILLS for any discharge medications will be authorized once you are discharged, as it is imperative that you return to your primary care physician (or establish a relationship with a primary care physician if you do not have one) for your aftercare needs so that they can reassess your need for medications and monitor your lab values.  You can reach the hospitalist office at phone [phone] or fax [phone]   If you do not have a primary care physician, you can call [phone] for a physician referral.  Activity: As tolerated with Full fall precautions use walker/cane & assistance as needed    Diet: heart healthy  Disposition Home

## 2018-05-19 NOTE — Consult Note (Addendum)
ELECTROPHYSIOLOGY CONSULT NOTE  Patient ID: Kimberly Castillo MRN: [id number], DOB/AGE: Nov 07, 1933   Admit date: 05/16/2018 Date of Consult: 05/19/2018  Primary Physician: Biagio Borg, MD Primary Cardiologist: Dr. Stanford Breed (2015) Reason for Consultation: Cryptogenic stroke ; recommendations regarding Implantable Loop Recorder, requested by Dr. Erlinda Hong  History of Present Illness Kimberly Castillo was admitted on 05/16/2018 with AMS.  They first developed symptoms several days prior to her arrival PMHx noted for HTN, DM, HLD, CKD, There is mention of AFib on her chart.  It seems during/after colon surgery in 2015 she had frequent PACs that were mistaken for PAF, no true AFib at that time by cardiology consultation.  That being said "AFib" shows up on her history list prior to this as well.  The details are unknown.  Given this neurology has asked [place] to consider loop implant   Neurology notes: Large acute to early subacute left PCA territory infarct including left occipital lobe and left thalamus - embolic pattern, concerning for cardioembolic source. .  she has undergone workup for stroke including echocardiogram and carotid dopplers.  The patient has been monitored on telemetry which has demonstrated sinus rhythm with no arrhythmias.  Inpatient stroke work-up will not include TEE given COVID19 restrictions Echocardiogram this admission demonstrated  IMPRESSIONS  1. The left ventricle has normal systolic function with an ejection fraction of 60-65%. The cavity size was normal. Left ventricular diastolic Doppler parameters are consistent with impaired relaxation. Elevated left ventricular end-diastolic pressure.  2. The right ventricle has normal systolic function. The cavity was normal. There is no increase in right ventricular wall thickness.  3. Mild calcification of the anterior mitral valve leaflet. There is mild mitral annular calcification present.  4. The interatrial septum appears to be lipomatous.R  5. Recommend agitated saline contrast study to fruleo ut PFO  FINDINGS  Left Ventricle: The left ventricle has normal systolic function, with an ejection fraction of 60-65%. The cavity size was normal. There is no increase in left ventricular wall thickness. Left ventricular diastolic Doppler parameters are consistent with  impaired relaxation. Elevated left ventricular end-diastolic pressure  Right Ventricle: The right ventricle has normal systolic function. The cavity was normal. There is no increase in right ventricular wall thickness.  Left Atrium: Left atrial size was normal in size.  Right Atrium: Right atrial size was normal in size. Right atrial pressure is estimated at 10 mmHg.  Interatrial Septum: The interatrial septum was not assessed. Increased thickness of the atrial septum sparing the fossa ovalis consistent with The interatrial septum appears to be lipomatous.  Pericardium: There is no evidence of pericardial effusion.  Mitral Valve: The mitral valve is normal in structure. Mild calcification of the anterior mitral valve leaflet. There is mild mitral annular calcification present. Mitral valve regurgitation is trivial by color flow Doppler.  Tricuspid Valve: The tricuspid valve is normal in structure. Tricuspid valve regurgitation is mild by color flow Doppler.  Aortic Valve: The aortic valve is normal in structure. Aortic valve regurgitation was not assessed by color flow Doppler.  Pulmonic Valve: The pulmonic valve was normal in structure. Pulmonic valve regurgitation was not assessed by color flow Doppler.  Venous: The inferior vena cava is normal in size with greater than 50% respiratory variability.    Lab work is reviewed. notabel for Creat 1.62  Prior to admission, the patient denies chest pain, shortness of breath, dizziness, or syncope.   She does mentioni rarely she feels her heart racing, these are  brief episodes She is recovering from her stroke  with plans to home at discharge.      Past Medical History:  Diagnosis Date   A-fib [facility])    Anxiety state 12/22/2013   Arthritis    hands, back   Arthritis, lumbar spine 12/19/2012   Cataracts, bilateral    Chronic lower back pain 12/19/2012   Diabetes mellitus ([place])    Diverticulitis    Dysrhythmia    GERD (gastroesophageal reflux disease)    Glaucoma    Headache    hx migraines   Heart murmur    History of shingles    Hyperlipidemia    Hypertension    Numbness    rt side of rt leg   Post herpetic neuralgia 12/19/2012     Surgical History:  Past Surgical History:  Procedure Laterality Date   ABDOMINAL HYSTERECTOMY  1964   partial   COLON RESECTION N/A 09/02/2013   Procedure: [place] colectomy;  Surgeon: Excell Seltzer, MD;  Location: [facility];  Service: General;  Laterality: N/A;   COLONOSCOPY     COLOSTOMY TAKEDOWN N/A 11/09/2014   Procedure: TAKEDOWN [place] COLOSTOMY;  Surgeon: Excell Seltzer, MD;  Location: [facility];  Service: General;  Laterality: N/A;   [place]   left   POLYPECTOMY       Medications Prior to Admission  Medication Sig Dispense Refill Last Dose   aspirin EC 81 MG tablet Take 81 mg by mouth daily.   05/16/2018 at Unknown time   Blood Glucose Monitoring Suppl (ONE TOUCH ULTRA 2) w/Device KIT USE as directed 1 each 0 05/16/2018 at Unknown time   diltiazem (CARDIZEM) 120 MG tablet Take 1 tablet (120 mg total) by mouth daily. For heart and blood pressure 90 tablet 3 05/16/2018 at am   glucose blood (ONE TOUCH ULTRA TEST) test strip Use as instructed daily E11.9 100 each 12 05/16/2018 at Unknown time   Lancets MISC Use as directed once daily 100 each 11 05/16/2018 at Unknown time   latanoprost (XALATAN) 0.005 % ophthalmic solution Place 1 drop into both eyes at bedtime.   Past Week at Unknown time   metFORMIN (GLUCOPHAGE) 500 MG tablet Take 1 tablet (500 mg total) by mouth 2 (two) times  daily with a meal. For diabetes 180 tablet 3 05/16/2018 at Unknown time   pantoprazole (PROTONIX) 40 MG tablet Take 1 tablet (40 mg total) by mouth daily. For reflux 90 tablet 0 05/16/2018 at Unknown time   simvastatin (ZOCOR) 40 MG tablet Take 1 tablet (40 mg total) by mouth at bedtime. For high cholesterol 90 tablet 3 Past Week at Unknown time   timolol (BETIMOL) 0.5 % ophthalmic solution Place 1 drop into both eyes daily.   Past Week at Unknown time    Inpatient Medications:   aspirin EC  325 mg Oral Daily   atorvastatin  80 mg Oral q1800   clopidogrel  75 mg Oral Daily   diltiazem  120 mg Oral Daily   insulin aspart  0-5 Units Subcutaneous QHS   insulin aspart  0-9 Units Subcutaneous TID WC   latanoprost  1 drop Both Eyes QHS   metoprolol tartrate  25 mg Oral BID   sodium chloride flush  3 mL Intravenous Once   timolol  1 drop Both Eyes Daily    Allergies: No Known Allergies  Social History   Socioeconomic History   Marital status: Widowed    Spouse name: Not on  file   Number of children: Not on file   Years of education: Not on file   Highest education level: Not on file  Occupational History   Not on file  Social Needs   Financial resource strain: Not on file   Food insecurity:    Worry: Not on file    Inability: Not on file   Transportation needs:    Medical: Not on file    Non-medical: Not on file  Tobacco Use   Smoking status: Former Smoker    Types: Cigarettes    Start date: 09/17/1991    Last attempt to quit: 10/31/1997    Years since quitting: 20.5   Smokeless tobacco: Never Used  Substance and Sexual Activity   Alcohol use: No   Drug use: No   Sexual activity: Yes  Lifestyle   Physical activity:    Days per week: Not on file    Minutes per session: Not on file   Stress: Not on file  Relationships   Social connections:    Talks on phone: Not on file    Gets together: Not on file    Attends religious service: Not on file      Active member of club or organization: Not on file    Attends meetings of clubs or organizations: Not on file    Relationship status: Not on file   Intimate partner violence:    Fear of current or ex partner: Not on file    Emotionally abused: Not on file    Physically abused: Not on file    Forced sexual activity: Not on file  Other Topics Concern   Not on file  Social History Narrative   Not on file     Family History  Problem Relation Age of Onset   Hypertension Mother    Hypertension Maternal Grandmother    Colon cancer Neg Hx    Stomach cancer Neg Hx    Colon polyps Neg Hx    Esophageal cancer Neg Hx    Rectal cancer Neg Hx       Review of Systems: All other systems reviewed and are otherwise negative except as noted above.  Physical Exam: Vitals:   05/18/18 2123 05/19/18 0024 05/19/18 0423 05/19/18 0803  BP: (!) 197/94 (!) 160/86 (!) 182/84 (!) 161/101  Pulse:  67 70 72  Resp:  20 20 18   Temp:  99 F (37.2 C) 99 F (37.2 C) 98.4 F (36.9 C)  TempSrc:  Oral Oral   SpO2:  100% 100% 99%  Weight:      Height:        LIMITED, visual exam given COVID19 GEN- The patient is well appearing, alert and oriented self, place, time, not to situation.   Head- normocephalic, atraumatic Eyes-  Sclera clear, conjunctiva pink Ears- hearing intact Oropharynx- clear Neck- supple Lungs- normal WOB Heart- telemetry and echo reviewed  GI- not examined Extremities- no clubbing, cyanosis, or edema MS- no obvious deformities Skin- no rash or lesions noted Psych- euthymic mood, full affect   Labs:   Lab Results  Component Value Date   WBC 5.3 05/19/2018   HGB 11.5 (L) 05/19/2018   HCT 34.4 (L) 05/19/2018   MCV 97.7 05/19/2018   PLT 236 05/19/2018    Recent Labs  Lab 05/16/18 1919  05/19/18 0648  NA 142   < > 140  K 3.4*   < > 3.8  CL 108   < > 112*  CO2  23   < > 20*  BUN 31*   < > 20  CREATININE 1.93*   < > 1.62*  CALCIUM 9.6   < > 9.2  PROT  7.2  --   --   BILITOT 0.5  --   --   ALKPHOS 74  --   --   ALT 18  --   --   AST 25  --   --   GLUCOSE 91   < > 90   < > = values in this interval not displayed.   Lab Results  Component Value Date   TROPONINI 0.04 (HH) 05/17/2018   Lab Results  Component Value Date   CHOL 211 (H) 05/15/2018   CHOL 149 07/08/2017   CHOL 202 (H) 02/12/2017   Lab Results  Component Value Date   HDL 47.50 05/15/2018   HDL 50.30 07/08/2017   HDL 49.50 02/12/2017   Lab Results  Component Value Date   LDLCALC 124 (H) 05/15/2018   LDLCALC 69 07/08/2017   LDLCALC 122 (H) 02/12/2017   Lab Results  Component Value Date   TRIG 198.0 (H) 05/15/2018   TRIG 147.0 07/08/2017   TRIG 152.0 (H) 02/12/2017   Lab Results  Component Value Date   CHOLHDL 4 05/15/2018   CHOLHDL 3 07/08/2017   CHOLHDL 4 02/12/2017   Lab Results  Component Value Date   LDLDIRECT 129.0 02/02/2016   LDLDIRECT 104.0 08/25/2015   LDLDIRECT 88.0 06/24/2014    No results found for: DDIMER   Radiology/Studies:   Dg Chest 2 View Result Date: 05/16/2018 CLINICAL DATA:  85 year old female with a history of altered mental status EXAM: CHEST - 2 VIEW COMPARISON:  10/12/2014, 09/01/2013 FINDINGS: Cardiomediastinal silhouette unchanged in size and contour. No pneumothorax or pleural effusion. No confluent airspace disease. Coarsened interstitial markings. Calcifications of the aortic arch. No displaced fracture.  Degenerative changes of the spine IMPRESSION: Negative for acute cardiopulmonary disease Electronically Signed   By: Corrie Mckusick D.O.   On: 05/16/2018 08:03    Mr Jodene Nam Head Wo Contrast Result Date: 05/17/2018 CLINICAL DATA:  Acute/subacute left PCA territory infarct. EXAM: MRA HEAD WITHOUT CONTRAST TECHNIQUE: Angiographic images of the Circle of Willis were obtained using MRA technique without intravenous contrast. COMPARISON:  MRI brain 05/16/2018 FINDINGS: The internal carotid arteries are within normal limits from the  high cervical segments through the ICA termini bilaterally. The A1 and M1 segments are normal. No definite anterior communicating artery is present. MCA bifurcations are intact. ACA and MCA branch vessels are within normal limits bilaterally. The vertebral arteries are symmetric. Basilar artery is normal. Both posterior cerebral arteries originate from the basilar tip. There is a focal occlusion of the left P2 segment prior to the bifurcation. Right PCA branch vessels are within normal limits. IMPRESSION: 1. Focal occlusion of the left PCA corresponding to the acute/subacute infarct. 2. No other large vessel occlusion. 3. Mild vessel irregularity without significant focal stenosis. Electronically Signed   By: San Morelle M.D.   On: 05/17/2018 15:53    Mr Brain Wo Contrast Result Date: 05/16/2018 CLINICAL DATA:  Initial evaluation for acute confusion, memory loss for 1 week. EXAM: MRI HEAD WITHOUT CONTRAST TECHNIQUE: Multiplanar, multiecho pulse sequences of the brain and surrounding structures were obtained without intravenous contrast. COMPARISON:  None available. FINDINGS: Brain: Cerebral volume within normal limits for age. Mild patchy T2/FLAIR hyperintensity within the periventricular and deep white matter both cerebral hemispheres most consistent with chronic small vessel ischemic  disease, mild in nature. Large confluent area of restricted diffusion involving the parasagittal left occipital lobe compatible with acute to early subacute left PCA territory infarct. Mild patchy involvement of left thalamus. No associated hemorrhage. Mild localized mass effect without midline shift. No other evidence for acute or subacute ischemia. Gray-white matter differentiation otherwise maintained. No other areas of chronic cortical infarction. No acute or chronic intracranial hemorrhage. No mass lesion, midline shift or mass effect. No hydrocephalus. No extra-axial fluid collection. Normal pituitary gland.  Vascular: Major intracranial vascular flow voids are maintained. Skull and upper cervical spine: Craniocervical junction within normal limits. Visualized upper cervical spine normal. Bone marrow signal intensity within normal limits. No scalp soft tissue abnormality. Sinuses/Orbits: Globes and orbital soft tissues within normal limits. Moderate mucosal thickening noted within the right maxillary sinus, likely chronic. Paranasal sinuses are otherwise largely clear. No mastoid effusion. Inner ear structures normal. Other: None. IMPRESSION: 1. Large acute to early subacute left PCA territory infarct. No associated hemorrhage or significant regional mass effect. 2. Underlying mild for age chronic microvascular ischemic disease. Results were called by telephone at the time of interpretation on 05/16/2018 at 7:14 pm to the physician assistant in the emergency room, [facility], who verbally acknowledged these results. Electronically Signed   By: Jeannine Boga M.D.   On: 05/16/2018 19:15    Vas US Carotid Result Date: 05/19/2018 Carotid Arterial Duplex Study Indications:       CVA and Altered mental status. Risk Factors:      Hypertension, hyperlipidemia, Diabetes. Other Factors:     Atrial fibrillation. Limitations:       Depth of vessels Comparison Study:  No prior study on file for comparison Performing Technologist: Sharion Dove RVS  Examination Guidelines: A complete evaluation includes B-mode imaging, spectral Doppler, color Doppler, and power Doppler as needed of all accessible portions of each vessel. Bilateral testing is considered an integral part of a complete examination. Limited examinations for reoccurring indications may be performed as noted.  Right Carotid Findings:   Summary: Right Carotid: Velocities in the right ICA are consistent with a 1-39% stenosis.                Significant plaque noted. Cannot exclude higher stenosis                secondary to body habitus limitation and depth of  vessels. Left Carotid: Velocities in the left ICA are consistent with a 40-59% stenosis.               Significant plaque noted. Cannot exclude higher stenosis secondary               to body habitus limitation and depth of vessels. Vertebrals:  Bilateral vertebral arteries demonstrate antegrade flow. Subclavians: Normal flow hemodynamics were seen in bilateral subclavian              arteries. *See table(s) above for measurements and observations.  Electronically signed by Antony Contras MD on 05/19/2018 at 9:27:27 AM.    Final     12-lead ECG SR All prior EKG's in EPIC reviewed with no documented atrial fibrillation  Telemetry SR, occ PVCs, less frequent PACs, once 4 beat PAT  Assessment and Plan:  1. Cryptogenic stroke The patient presents with cryptogenic stroke.  I spoke at length with the patient as well as her son Jeneen Rinks via telephone about monitoring for afib with either a 30 day event monitor or an implantable loop recorder.  Risks, benefits, and  alteratives to implantable loop recorder were discussed with the, both today.   At this time, the spn is very much in favor of proceeding with loop implant, the patient agreeable as well.  Wound care was reviewed with the patient and her son (keep incision clean and dry for 3 days).  Wound check will be scheduled for the patient  Please call with questions.   Renee Dyane Dustman, PA-C 05/19/2018  EP Attending  Patient seen and examined. Agree with the findings as noted above. The patient has had a cryptogenic stroke.She has multiple risk factors for atrial fibrillation. I have recommended insertion of an ILR. The risks/benefits/goals/expectations of the procedure were reviewed and she wishes to proceed.

## 2018-05-19 NOTE — Progress Notes (Signed)
Discharge instructions given to patient's son Maekayla Giorgio) over the phone. Son verbalized understanding and all questions were answered. Discharge package sent with patient.

## 2018-05-19 NOTE — Progress Notes (Addendum)
STROKE TEAM PROGRESS NOTE   SUBJECTIVE (INTERVAL HISTORY) Pt lying in bed, awake alert, had loop recorder placed. Still has anomia and speech hesitation.     OBJECTIVE Vitals:   05/19/18 0423 05/19/18 0803 05/19/18 1141 05/19/18 1546  BP: (!) 182/84 (!) 161/101 (!) 178/86 (!) 168/86  Pulse: 70 72 75 78  Resp: 20 18 18 18   Temp: 99 F (37.2 C) 98.4 F (36.9 C) 98.2 F (36.8 C) 99.1 F (37.3 C)  TempSrc: Oral   Oral  SpO2: 100% 99% 99% 99%  Weight:      Height:        CBC:  Recent Labs  Lab 05/15/18 1328 05/16/18 1919  05/18/18 1822 05/19/18 0648  WBC 5.9 5.9  --  4.9 5.3  NEUTROABS 3.5 3.2  --   --   --   HGB 12.1 11.7*   < > 11.3* 11.5*  HCT 36.1 36.5   < > 34.7* 34.4*  MCV 97.6 99.2  --  97.5 97.7  PLT 201.0 212  --  214 236   < > = values in this interval not displayed.    Basic Metabolic Panel:  Recent Labs  Lab 05/15/18 1328  05/18/18 1822 05/19/18 0648  NA 141   < > 141 140  K 3.7   < > 3.1* 3.8  CL 106   < > 110 112*  CO2 26   < > 23 20*  GLUCOSE 190*   < > 145* 90  BUN 32*   < > 22 20  CREATININE 2.09*   < > 1.71* 1.62*  CALCIUM 9.2   < > 9.0 9.2  MG 1.8  --   --   --    < > = values in this interval not displayed.    Lipid Panel:     Component Value Date/Time   CHOL 211 (H) 05/15/2018 1328   TRIG 198.0 (H) 05/15/2018 1328   HDL 47.50 05/15/2018 1328   CHOLHDL 4 05/15/2018 1328   VLDL 39.6 05/15/2018 1328   LDLCALC 124 (H) 05/15/2018 1328   HgbA1c:  Lab Results  Component Value Date   HGBA1C 6.4 05/15/2018   Urine Drug Screen: No results found for: LABOPIA, COCAINSCRNUR, LABBENZ, AMPHETMU, THCU, LABBARB  Alcohol Level No results found for: [facility]  IMAGING  Dg Chest 2 View 05/16/2018 IMPRESSION:  Negative for acute cardiopulmonary disease.   Mr Brain Wo Contrast 05/16/2018 IMPRESSION:  1. Large acute to early subacute left PCA territory infarct. No associated hemorrhage or significant regional mass effect.  2. Underlying mild for  age chronic microvascular ischemic disease.   MRA Head  1. Focal occlusion of the left PCA corresponding to the acute/subacute infarct. 2. No other large vessel occlusion. 3. Mild vessel irregularity without significant focal stenosis.  Transthoracic Echocardiogram  Pending   Bilateral Carotid Dopplers  Right Carotid: Velocities in the right ICA are consistent with a 1-39% stenosis. Significant plaque noted. Cannot exclude higher stenosis secondary to body habitus limitation and depth of vessels.  Left Carotid: Velocities in the left ICA are consistent with a 40-59% stenosis. Significant plaque noted. Cannot exclude higher stenosis secondary to body habitus limitation and depth of vessels.  Vertebrals:  Bilateral vertebral arteries demonstrate antegrade flow. Subclavians: Normal flow hemodynamics were seen in bilateral subclavian arteries.   EKG - SR rate 70 BPM. (See cardiology reading for complete details)    PHYSICAL EXAM  Temp:  [98.2 F (36.8 C)-99.1 F (37.3 C)] 99.1 F (  37.3 C) (05/18 1546) Pulse Rate:  [67-80] 78 (05/18 1546) Resp:  [18-21] 18 (05/18 1546) BP: (160-198)/(84-101) 168/86 (05/18 1546) SpO2:  [99 %-100 %] 99 % (05/18 1546)  General - Well nourished, well developed, in no apparent distress.  Ophthalmologic - fundi not visualized due to noncooperation.  Cardiovascular - Regular rate and rhythm.  Mental Status -  Awake, alert but not orientation to time, age, or person, but knows in hospital but not able to name hospital Follows simple commands, paucity of speech.  Anomia, able to repeat simple sentences but not a complex sentences.  Cranial Nerves II - XII - II - right hemianopia. III, IV, VI - Extraocular movements intact. V - Facial sensation intact bilaterally. VII - Facial movement intact bilaterally. VIII - Hearing & vestibular intact bilaterally. X - Palate elevates symmetrically. XI - Chin turning & shoulder shrug intact  bilaterally. XII - Tongue protrusion intact.  Motor Strength - The patient's strength was symmetrical in all extremities and pronator drift was absent.  Bulk was normal and fasciculations were absent.   Motor Tone - Muscle tone was assessed at the neck and appendages and was normal.  Reflexes - The patient's reflexes were symmetrical in all extremities and she had no pathological reflexes.  Sensory - Light touch, temperature/pinprick were assessed and were symmetrical.    Coordination - The patient had normal movements in the hands with no ataxia or dysmetria.  Tremor was absent.  Gait and Station - deferred.   ASSESSMENT/PLAN [name] is a 85 y.o. female with history of atrial fibrillation not on anticoagulation, diabetes mellitus, hypertension, migraines and hyperlipidemia  presenting with AMS and left sided headache. She did not receive IV t-PA due to late presentation (>4.5 hours from time of onset)  Stroke: Large acute to early subacute left PCA territory infarct including left occipital lobe and left thalamus - embolic pattern, concerning for cardioembolic source.  Resultant right hemianopia, disorientation  MRI head - Large acute to early subacute left PCA territory infarct.  MRA head - left distal PCA occlusion  Carotid Doppler - left ICA 40 to 59% stenosis  2D Echo - EF 60-65%  LDL - 124  HgbA1c - 6.4  VTE prophylaxis - SCDs   Diet  - Carb modified with thin liquids  aspirin 81 mg daily prior to admission, now on aspirin 325 mg and plavix 75 mg daily.  Continue DAPT for 3 months and then Plavix alone given intracranial high grade stenosis/occlusion.  Patient counseled to be compliant with her antithrombotic medications  Ongoing aggressive stroke risk factor management  Therapy recommendations:  HH PT  Disposition:  Pending  Pt can not drive due to hemianopia  No documented A. Fib in chart  A. fib was listed as PMH  However there is no previous  record of A. Fib  All EKGs were in sinus  Loop recorder placed  Hypertension  BP on the high end  Resume Cardizem  Add metoprolol 25mg  bid  Consider gradually normalize BP in 2 to 3 days . Long-term BP goal normotensive   Hyperlipidemia  Lipid lowering medication PTA:  Zocor 40 mg daily  LDL 124, goal < 70  Current lipid lowering medication: Lipitor 80 mg daily  Continue statin at discharge  Diabetes  HgbA1c 6.4, goal < 7.0  Controlled  SSI  CBG monitoring  PCP follow up closely  metformin on hold due to elevated Cre  Other Stroke Risk Factors  Advanced age  Former cigarette smoker - quit 22 years ago  Atrial fibrillation without anticoagulation  Migraines  Other Active Problems  Hypokalemia - 3.3  CKD stage III with creatinine - 2.0  Hospital day # 2  Neurology will sign off. Please call with questions. Pt will follow up with stroke clinic NP at [facility] in about 4 weeks. Thanks for the consult.   Rosalin Hawking, MD PhD Stroke Neurology 05/19/2018 5:37 PM   To contact Stroke Continuity provider, please refer to [email or web address] After hours, contact General Neurology

## 2018-05-19 NOTE — Progress Notes (Signed)
PT Cancellation Note  Patient Details Name: Kimberly Castillo MRN: [id number] DOB: December 19, 1933   Cancelled Treatment:    Reason Eval/Treat Not Completed: Patient at procedure or test/unavailable Pt currently at procedure. Will follow up as schedule allows.   Leighton Ruff, PT, DPT  Acute Rehabilitation Services  Pager: [phone] Office: [phone]    Rudean Hitt 05/19/2018, 2:23 PM

## 2018-05-19 NOTE — Progress Notes (Signed)
SLP Cancellation Note  Patient Details Name: Kimberly Castillo MRN: [id number] DOB: [birth year]   Cancelled treatment:        Unable to complete evaluation due to procedure (loop recorder).    [name], MA, CCC-SLP 05/19/2018 2:01 PM

## 2018-05-19 NOTE — Progress Notes (Signed)
Physical Therapy Treatment Patient Details Name: Kimberly Castillo MRN: [id number] DOB: Jan 17, 1933 Today's Date: 05/19/2018    History of Present Illness Pt is a 85 yo F presenting secondary to CVA. MRI revealed L PCA infarct.  PMH significant for CVA, T2DM, HTN, hyperlipidemia, GERD, Afib, CKD.     PT Comments    Pt progressing towards goals. Continues to run into objects on the L and the R and tended to drift to the L during gait. Educated about visual scanning, however, pt not responding to cues. Required min A for stair navigation. Educated about need for assist at d/c. Current recommendations appropriate as pt's son able to provide 24/7 assist. Will continue to follow acutely to maximize functional mobility independence and safety.    Follow Up Recommendations  Home health PT;Supervision/Assistance - 24 hour     Equipment Recommendations  Rolling walker with 5" wheels    Recommendations for Other Services OT consult     Precautions / Restrictions Precautions Precautions: Fall Restrictions Weight Bearing Restrictions: No    Mobility  Bed Mobility Overal bed mobility: Needs Assistance Bed Mobility: Supine to Sit     Supine to sit: Min assist     General bed mobility comments: Min A for trunk elevation.   Transfers Overall transfer level: Needs assistance Equipment used: Rolling walker (2 wheeled) Transfers: Sit to/from Stand Sit to Stand: Min guard         General transfer comment: Min guard for safety. Required cues for safe hand placement.   Ambulation/Gait Ambulation/Gait assistance: Min guard Gait Distance (Feet): 100 Feet Assistive device: Rolling walker (2 wheeled) Gait Pattern/deviations: Drifts right/left;Step-through pattern;Decreased stride length Gait velocity: decreased   General Gait Details: Pt drifting to the L during ambulation and running into objects on the L and R. Educated about visual scanning, however, pt continued to run into object.     Stairs Stairs: Yes Stairs assistance: Min assist Stair Management: One rail Right;Step to pattern;Forwards Number of Stairs: 2 General stair comments: Min A for steadying assist throughout stair management. Required cues for sequencing and use of step to pattern.    Wheelchair Mobility    Modified Rankin (Stroke Patients Only) Modified Rankin (Stroke Patients Only) Pre-Morbid Rankin Score: No significant disability Modified Rankin: Moderately severe disability     Balance Overall balance assessment: Needs assistance Sitting-balance support: Feet supported;No upper extremity supported Sitting balance-Leahy Scale: Fair     Standing balance support: Bilateral upper extremity supported;During functional activity Standing balance-Leahy Scale: Poor Standing balance comment: Reliant on BUE support                             Cognition Arousal/Alertness: Awake/alert Behavior During Therapy: WFL for tasks assessed/performed Overall Cognitive Status: No family/caregiver present to determine baseline cognitive functioning Area of Impairment: Attention;Memory;Following commands;Orientation;Safety/judgement;Awareness;Problem solving                 Orientation Level: Disoriented to;Time;Situation Current Attention Level: Sustained Memory: Decreased short-term memory Following Commands: Follows one step commands consistently Safety/Judgement: Decreased awareness of safety;Decreased awareness of deficits Awareness: Emergent Problem Solving: Slow processing;Difficulty sequencing;Requires verbal cues;Requires tactile cues General Comments: Pt unaware that she had loop recorder placed. When educated that she had CVA, pt reporting "that's what they tell me".       Exercises      General Comments General comments (skin integrity, edema, etc.): Educated RN to educate son about not letting dog run around  pt's feet.       Pertinent Vitals/Pain Pain Assessment:  Faces Faces Pain Scale: Hurts little more Pain Location: back and L shoulder  Pain Descriptors / Indicators: Aching Pain Intervention(s): Limited activity within patient's tolerance;Monitored during session;Repositioned    Home Living                      Prior Function            PT Goals (current goals can now be found in the care plan section) Acute Rehab PT Goals Patient Stated Goal: to go home today PT Goal Formulation: With patient Time For Goal Achievement: 05/31/18 Potential to Achieve Goals: Good Progress towards PT goals: Progressing toward goals    Frequency    Min 4X/week      PT Plan Frequency needs to be updated    Co-evaluation              AM-PAC PT "6 Clicks" Mobility   Outcome Measure  Help needed turning from your back to your side while in a flat bed without using bedrails?: None Help needed moving from lying on your back to sitting on the side of a flat bed without using bedrails?: A Little Help needed moving to and from a bed to a chair (including a wheelchair)?: A Little Help needed standing up from a chair using your arms (e.g., wheelchair or bedside chair)?: A Little Help needed to walk in hospital room?: A Little Help needed climbing 3-5 steps with a railing? : A Little 6 Click Score: 19    End of Session Equipment Utilized During Treatment: Gait belt Activity Tolerance: Patient tolerated treatment well Patient left: in bed;with call bell/phone within reach;with bed alarm set(sitting EOB ) Nurse Communication: Mobility status PT Visit Diagnosis: Unsteadiness on feet (R26.81);Other abnormalities of gait and mobility (R26.89)     Time: 1639-1700 PT Time Calculation (min) (ACUTE ONLY): 21 min  Charges:  $Gait Training: 8-22 mins                     Leighton Ruff, PT, DPT  Acute Rehabilitation Services  Pager: [phone] Office: [phone]    Rudean Hitt 05/19/2018, 5:33 PM

## 2018-05-19 NOTE — TOC Initial Note (Signed)
Transition of Care [facility]) - Initial/Assessment Note    Patient Details  Name: Kimberly Castillo MRN: [id number] Date of Birth: 04-Jul-1933  [facility]) CM/SW Contact:    Bethena Roys, RN Phone Number: 05/19/2018, 11:38 AM  Clinical Narrative:  Pt presented for CVA- plan for loop recorder today and transition home thereafter- son to provide transportation home. CM did reach out to son Jeneen Rinks and she lives with him and has additional family support in the home. Pt has DME RW and shower chair at home. CM did offer choice for RN, PT, OT,Aide-pt has used [facility] in the past and wants to utilize again. CM did make referral with Butch Penny and [place] to begin within 24-48 hours post transition home. CM did make MD aware of plan of care and orders needed. No further needs from CM at this time.                Expected Discharge Plan: [place] Barriers to Discharge: No Barriers Identified   Patient Goals and CMS Choice Patient states their goals for this hospitalization and ongoing recovery are:: "to go back home to see family" CMS Medicare.gov Compare Post Acute Care list provided to:: Patient Represenative (must comment)(Son Jeneen Rinks) Choice offered to / list presented to : Adult Children  Expected Discharge Plan and Services Expected Discharge Plan: [place] In-house Referral: NA Discharge Planning Services: CM Consult Post Acute Care Choice: [place] arrangements for the past 2 months: Single Family Home                 DME Arranged: N/A DME Agency: NA       HH Arranged: RN, Disease Management, PT, OT, Nurse's Aide [place] Agency: [place] ([facility]) Date HH Agency Contacted: 05/19/18 Time [place]: 1134 Representative spoke with at [place]: Butch Penny  Prior Living Arrangements/Services Living arrangements for the past 2 months: Single Family Home Lives with:: Adult Children, Relatives Patient language and need  for interpreter reviewed:: Yes Do you feel safe going back to the place where you live?: Yes      Need for Family Participation in Patient Care: Yes (Comment) Care giver support system in place?: Yes (comment) Current home services: DME(Pt has DME RW, Civil engineer, contracting) Criminal Activity/Legal Involvement Pertinent to Current Situation/Hospitalization: No - Comment as needed  Activities of Daily Living Home Assistive Devices/Equipment: Eyeglasses, Environmental consultant (specify type) ADL Screening (condition at time of admission) Patient's cognitive ability adequate to safely complete daily activities?: Yes Is the patient deaf or have difficulty hearing?: No Does the patient have difficulty seeing, even when wearing glasses/contacts?: Yes Does the patient have difficulty concentrating, remembering, or making decisions?: Yes Patient able to express need for assistance with ADLs?: Yes Does the patient have difficulty dressing or bathing?: No Independently performs ADLs?: Yes (appropriate for developmental age) Does the patient have difficulty walking or climbing stairs?: No Weakness of Legs: None Weakness of Arms/Hands: None  Permission Sought/Granted Permission sought to share information with : Family Supports Permission granted to share information with : Yes, Verbal Permission Granted  Share Information with NAME: [place],James [phone]           Emotional Assessment Appearance:: Appears stated age Attitude/Demeanor/Rapport: Engaged Affect (typically observed): Accepting Orientation: : Oriented to Self, Fluctuating Orientation (Suspected and/or reported Sundowners) Alcohol / Substance Use: Not Applicable Psych Involvement: No (comment)  Admission diagnosis:  Acute CVA (cerebrovascular accident) [facility]) [I63.9] Patient Active Problem List  Diagnosis Date Noted  . Stroke (cerebrum) ([place]) 05/17/2018  . CVA (cerebral vascular accident) ([place]) 05/16/2018  . Noncompliance 05/16/2018  . Vitamin D  deficiency 05/16/2018  . Abnormal EKG 05/16/2018  . Headache 05/16/2018  . Memory changes 05/13/2018  . Right knee pain 02/12/2017  . Spondylolisthesis, lumbar region 03/02/2016  . CKD (chronic kidney disease), stage III ([place]) 02/16/2016  . Chronic low back pain 07/12/2015  . Bilateral foot pain 01/26/2015  . Colostomy status ([place]) 11/09/2014  . Rash 06/24/2014  . Preventative health care 12/31/2013  . Anxiety state 12/22/2013  . Yeast vaginitis 12/22/2013  . Diverticulitis of colon with perforation s/p colectomy/ostomy 09/02/2013 09/02/2013  . Post herpetic neuralgia 12/19/2012  . Arthritis, lumbar spine 12/19/2012  . Glaucoma   . A-fib ([place])   . GERD (gastroesophageal reflux disease) 10/06/2012  . DM type 2 (diabetes mellitus, type 2) ([place]) 09/22/2012  . Hypertension 09/22/2012  . Hyperlipemia 09/22/2012   PCP:  Biagio Borg, MD Pharmacy:   [facility] [place] [address] Phone: [phone] Fax: [phone]     Social Determinants of Health (SDOH) Interventions    Readmission Risk Interventions No flowsheet data found.

## 2018-05-19 NOTE — Discharge Summary (Signed)
Physician Discharge Summary  Kimberly Castillo VZC:[id number] DOB: 10-03-1933 DOA: 05/16/2018  PCP: Biagio Borg, MD  Admit date: 05/16/2018 Discharge date: 05/19/2018  Admitted From: Home Disposition: Home  Recommendations for Outpatient Follow-up:  1. Follow up with PCP in 1-2 weeks 2. Follow-up with neurology in 2 months 3. Follow-up with cardiology as scheduled  Home Health: PT Equipment/Devices: Walker  Discharge Condition: Stable CODE STATUS: Full code Diet recommendation: Heart healthy  HPI: Per admitting MD, Kimberly Castillo is an 85 y.o. female with medical history significant of atrial fibrillation, type 2 diabetes mellitus, GERD, and hyperlipidemia is to the emergency department due to 6-day onset of altered mental status. History cannot be obtained from patient due to recent changes in memory.  History was obtained from son at bedside, per son, patient was noted to present with change in mental status on Saturday(05/10/2018) in which case she was confused and was not behaving as herself, on following day (Sunday, 05/11/2018), patient slept for the most part of the day which was unusual in patient.  She was seen by her PCP (Dr. Cathlean Cower) on Tuesday (05/13/2018) during week chest x-ray and some lab work were ordered. Head MRI to rule out CVA ordered was done today and showed large acute to early subacute left PCA territory infarct and patient was asked to go to the ED for further evaluation and management.  She denies slurred speech or any weakness in the extremities.  However she complained of worsening memory left parietal area headache.  Patient denies nausea, vomiting, chest pain, shortness of breath or abdominal pain. ED Course: In the emergency department, BP was elevated at 107/85 and other vital signs are within normal range.  Work-up in the ED showed hypokalemia and elevated BUN/creatinine.  A1c was checked and was 6.4, cholesterol 211, triglycerides 188, LDL around 24.  Vitamin D was  15.51. SARS coronavirus 2 was negative. Neurologist (Dr.Aroor) was consulted by ED physician and will see patient as a consult after being admitted by medicine team per ED physician and ED chart.  Hospitalist was asked to admit patient for further evaluation and management.  Hospital Course: Principal Problem Acute stroke /acute metabolic encephalopathy / right hemianopia-patient was admitted to the hospital with concern for acute CVA. MRI of the head prior to admission showed large acute to early subacute left PCA territory infarct. Neurology was consulted and followed patient while hospitalized.  She underwent a full work-up, 2D echo with EF 60-65%, has a degree of diastolic dysfunction. ? PFO. Carotids 1-39% on the right, 40-59% on left, A1C 6.4, LDL 124, patient was discharged on a statin.  PT recommended home health therapies which were arranged prior to discharge.  She is to continue dual antiplatelet therapy with aspirin and Plavix per neurology for 3 months, then Plavix alone.  With this, clinically patient has been having some short-term memory loss and intermittent confusion which is somewhat improved but still present.  She will be going home living with her son and will have 24-hour supervision.  Active Problems Hypertension -resume home medications Hyperlipidemia -Continue statin Type 2 diabetes mellitus -resume home medications  ?  History of A. Fib -Patient's diagnosis of A. fib dates back as far as I could see in 2014.  I do not see a documented EKG during this admission nor in the past which confirms the A. Fib.  Telemetry in the hospital showed sinus rhythm.  Patient unaware of A. fib, discussed with son over the phone  he is also unaware of this.  I am not sure when was her A. fib and under what conditions.  Given uncertainty, she had a loop recorder placed by EP prior to discharge. Chronic kidney disease stage IV -Creatinine around 1.8-1.9 in the last 12 months, currently at 1.6 on  discharge.  Continue to avoid nephrotoxic drugs and recommend outpatient periodic monitoring. Vitamin D deficiency -this will be supplemented  Discharge Diagnoses:  Principal Problem:   CVA (cerebral vascular accident) ([place]) Active Problems:   DM type 2 (diabetes mellitus, type 2) (HCC)   Hypertension   Hyperlipemia   GERD (gastroesophageal reflux disease)   A-fib (HCC)   CKD (chronic kidney disease), stage III ([place])   Noncompliance   Vitamin D deficiency   Abnormal EKG   Headache   Stroke (cerebrum) ([place])     Discharge Instructions   Allergies as of 05/19/2018   No Known Allergies     Medication List    STOP taking these medications   simvastatin 40 MG tablet Commonly known as:  ZOCOR     TAKE these medications   aspirin 325 MG EC tablet Take 1 tablet (325 mg total) by mouth daily. Start taking on:  May 20, 2018 What changed:    medication strength  how much to take   atorvastatin 80 MG tablet Commonly known as:  LIPITOR Take 1 tablet (80 mg total) by mouth daily at 6 PM.   clopidogrel 75 MG tablet Commonly known as:  PLAVIX Take 1 tablet (75 mg total) by mouth daily. Start taking on:  May 20, 2018   diltiazem 120 MG tablet Commonly known as:  CARDIZEM Take 1 tablet (120 mg total) by mouth daily. For heart and blood pressure   glucose blood test strip Commonly known as:  ONE TOUCH ULTRA TEST Use as instructed daily E11.9   Lancets Misc Use as directed once daily   latanoprost 0.005 % ophthalmic solution Commonly known as:  XALATAN Place 1 drop into both eyes at bedtime.   metFORMIN 500 MG tablet Commonly known as:  GLUCOPHAGE Take 1 tablet (500 mg total) by mouth 2 (two) times daily with a meal. For diabetes   metoprolol tartrate 25 MG tablet Commonly known as:  LOPRESSOR Take 1 tablet (25 mg total) by mouth 2 (two) times daily.   ONE TOUCH ULTRA 2 w/Device Kit USE as directed   pantoprazole 40 MG tablet Commonly known as:   PROTONIX Take 1 tablet (40 mg total) by mouth daily. For reflux   timolol 0.5 % ophthalmic solution Commonly known as:  BETIMOL Place 1 drop into both eyes daily.   Vitamin D (Ergocalciferol) 1.25 MG (50000 UT) Caps capsule Commonly known as:  DRISDOL Take 1 capsule (50,000 Units total) by mouth every 7 (seven) days.      Follow-up Information    [facility] Follow up.   Specialty:  Home Health Services Why:  Registered Nurse, Physical & Occupational Therapies, Aide-office to call you and Start of Care to begin within 24-48 hours post transition home. Office Number [phone].        [facility] Follow up.   Specialty:  Cardiology Why:  05/29/2018 @ 10:30AM, wound check visit Contact information: [address] [place] [phone]       Rosalin Hawking, MD. Schedule an appointment as soon as possible for a visit in 1 month(s).   Specialty:  Neurology Contact information: [address]  [place] [address] [phone]           Consultations:  Neurology   Procedures/Studies:  2D echo  Dg Chest 2 View  Result Date: 05/16/2018 CLINICAL DATA:  85 year old female with a history of altered mental status EXAM: CHEST - 2 VIEW COMPARISON:  10/12/2014, 09/01/2013 FINDINGS: Cardiomediastinal silhouette unchanged in size and contour. No pneumothorax or pleural effusion. No confluent airspace disease. Coarsened interstitial markings. Calcifications of the aortic arch. No displaced fracture.  Degenerative changes of the spine IMPRESSION: Negative for acute cardiopulmonary disease Electronically Signed   By: Corrie Mckusick D.O.   On: 05/16/2018 08:03   Mr Jodene Nam Head Wo Contrast  Result Date: 05/17/2018 CLINICAL DATA:  Acute/subacute left PCA territory infarct. EXAM: MRA HEAD WITHOUT CONTRAST TECHNIQUE: Angiographic images of the Circle of Willis were obtained using MRA technique without  intravenous contrast. COMPARISON:  MRI brain 05/16/2018 FINDINGS: The internal carotid arteries are within normal limits from the high cervical segments through the ICA termini bilaterally. The A1 and M1 segments are normal. No definite anterior communicating artery is present. MCA bifurcations are intact. ACA and MCA branch vessels are within normal limits bilaterally. The vertebral arteries are symmetric. Basilar artery is normal. Both posterior cerebral arteries originate from the basilar tip. There is a focal occlusion of the left P2 segment prior to the bifurcation. Right PCA branch vessels are within normal limits. IMPRESSION: 1. Focal occlusion of the left PCA corresponding to the acute/subacute infarct. 2. No other large vessel occlusion. 3. Mild vessel irregularity without significant focal stenosis. Electronically Signed   By: San Morelle M.D.   On: 05/17/2018 15:53   Mr Brain Wo Contrast  Result Date: 05/16/2018 CLINICAL DATA:  Initial evaluation for acute confusion, memory loss for 1 week. EXAM: MRI HEAD WITHOUT CONTRAST TECHNIQUE: Multiplanar, multiecho pulse sequences of the brain and surrounding structures were obtained without intravenous contrast. COMPARISON:  None available. FINDINGS: Brain: Cerebral volume within normal limits for age. Mild patchy T2/FLAIR hyperintensity within the periventricular and deep white matter both cerebral hemispheres most consistent with chronic small vessel ischemic disease, mild in nature. Large confluent area of restricted diffusion involving the parasagittal left occipital lobe compatible with acute to early subacute left PCA territory infarct. Mild patchy involvement of left thalamus. No associated hemorrhage. Mild localized mass effect without midline shift. No other evidence for acute or subacute ischemia. Gray-white matter differentiation otherwise maintained. No other areas of chronic cortical infarction. No acute or chronic intracranial  hemorrhage. No mass lesion, midline shift or mass effect. No hydrocephalus. No extra-axial fluid collection. Normal pituitary gland. Vascular: Major intracranial vascular flow voids are maintained. Skull and upper cervical spine: Craniocervical junction within normal limits. Visualized upper cervical spine normal. Bone marrow signal intensity within normal limits. No scalp soft tissue abnormality. Sinuses/Orbits: Globes and orbital soft tissues within normal limits. Moderate mucosal thickening noted within the right maxillary sinus, likely chronic. Paranasal sinuses are otherwise largely clear. No mastoid effusion. Inner ear structures normal. Other: None. IMPRESSION: 1. Large acute to early subacute left PCA territory infarct. No associated hemorrhage or significant regional mass effect. 2. Underlying mild for age chronic microvascular ischemic disease. Results were called by telephone at the time of interpretation on 05/16/2018 at 7:14 pm to the physician assistant in the emergency room, [facility], who verbally acknowledged these results. Electronically Signed   By: Jeannine Boga M.D.   On: 05/16/2018 19:15   Vas US Carotid  Result Date:  05/19/2018 Carotid Arterial Duplex Study Indications:       CVA and Altered mental status. Risk Factors:      Hypertension, hyperlipidemia, Diabetes. Other Factors:     Atrial fibrillation. Limitations:       Depth of vessels Comparison Study:  No prior study on file for comparison Performing Technologist: Sharion Dove RVS  Examination Guidelines: A complete evaluation includes B-mode imaging, spectral Doppler, color Doppler, and power Doppler as needed of all accessible portions of each vessel. Bilateral testing is considered an integral part of a complete examination. Limited examinations for reoccurring indications may be performed as noted.  Right Carotid Findings: +----------+--------+--------+--------+----------------------+--------+             PSV cm/s EDV  cm/s Stenosis Describe               Comments  +----------+--------+--------+--------+----------------------+--------+  CCA Prox   75       17                heterogenous                     +----------+--------+--------+--------+----------------------+--------+  CCA Distal 91       23                homogeneous                      +----------+--------+--------+--------+----------------------+--------+  ICA Prox   34       11                irregular and calcific           +----------+--------+--------+--------+----------------------+--------+  ICA Distal 111      21                                                 +----------+--------+--------+--------+----------------------+--------+  ECA        181      20                                                 +----------+--------+--------+--------+----------------------+--------+ +----------+--------+-------+--------+-------------------+             PSV cm/s EDV cms Describe Arm Pressure (mmHG)  +----------+--------+-------+--------+-------------------+  Subclavian 147                                            +----------+--------+-------+--------+-------------------+ +---------+--------+--+--------+--+  Vertebral PSV cm/s 70 EDV cm/s 16  +---------+--------+--+--------+--+  Left Carotid Findings: +----------+--------+--------+--------+----------------------+--------+             PSV cm/s EDV cm/s Stenosis Describe               Comments  +----------+--------+--------+--------+----------------------+--------+  CCA Prox   71       17                homogeneous                      +----------+--------+--------+--------+----------------------+--------+  CCA Distal 60       21  homogeneous                      +----------+--------+--------+--------+----------------------+--------+  ICA Prox   262      62                calcific and irregular           +----------+--------+--------+--------+----------------------+--------+  ICA Distal 86       16                                                  +----------+--------+--------+--------+----------------------+--------+  ECA        67       14                                                 +----------+--------+--------+--------+----------------------+--------+ +----------+--------+--------+--------+-------------------+  Subclavian PSV cm/s EDV cm/s Describe Arm Pressure (mmHG)  +----------+--------+--------+--------+-------------------+             59                                              +----------+--------+--------+--------+-------------------+ +---------+--------+--+--------+--+  Vertebral PSV cm/s 67 EDV cm/s 17  +---------+--------+--+--------+--+  Summary: Right Carotid: Velocities in the right ICA are consistent with a 1-39% stenosis.                Significant plaque noted. Cannot exclude higher stenosis                secondary to body habitus limitation and depth of vessels. Left Carotid: Velocities in the left ICA are consistent with a 40-59% stenosis.               Significant plaque noted. Cannot exclude higher stenosis secondary               to body habitus limitation and depth of vessels. Vertebrals:  Bilateral vertebral arteries demonstrate antegrade flow. Subclavians: Normal flow hemodynamics were seen in bilateral subclavian              arteries. *See table(s) above for measurements and observations.  Electronically signed by Antony Contras MD on 05/19/2018 at 9:27:27 AM.    Final      Subjective: - no chest pain, shortness of breath, no abdominal pain, nausea or vomiting.  Alert to place and year but somewhat confused to situation  Discharge Exam: BP (!) 168/86 (BP Location: Left Arm)    Pulse 78    Temp 99.1 F (37.3 C) (Oral)    Resp 18    Ht _0  (1.575 m)    Wt 61.2 kg    SpO2 99%    BMI 24.69 kg/m   General: Pt is alert, awake, not in acute distress Cardiovascular: RRR, S1/S2 +, no rubs, no gallops Respiratory: CTA bilaterally, no wheezing, no rhonchi Abdominal: Soft, NT, ND,  bowel sounds + Extremities: no edema, no cyanosis    The results of significant diagnostics from this hospitalization (including imaging, microbiology, ancillary and laboratory) are listed below for reference.     Microbiology: Recent Results (from the  past 240 hour(s))  Urine Culture     Status: None   Collection Time: 05/15/18  3:16 PM  Result Value Ref Range Status   MICRO NUMBER: [id number]  Final   SPECIMEN QUALITY: Adequate  Final   Sample Source URINE  Final   STATUS: FINAL  Final   Result:   Final    Multiple organisms present, each less than 10,000 CFU/mL. These organisms, commonly found on external and internal genitalia, are considered to be colonizers. No further testing performed.  SARS Coronavirus 2 (CEPHEID - Performed in [place] hospital lab), Hosp Order     Status: None   Collection Time: 05/16/18  9:23 PM  Result Value Ref Range Status   SARS Coronavirus 2 NEGATIVE NEGATIVE Final    Comment: (NOTE) If result is NEGATIVE SARS-CoV-2 target nucleic acids are NOT DETECTED. The SARS-CoV-2 RNA is generally detectable in upper and lower  respiratory specimens during the acute phase of infection. The lowest  concentration of SARS-CoV-2 viral copies this assay can detect is 250  copies / mL. A negative result does not preclude SARS-CoV-2 infection  and should not be used as the sole basis for treatment or other  patient management decisions.  A negative result may occur with  improper specimen collection / handling, submission of specimen other  than nasopharyngeal swab, presence of viral mutation(s) within the  areas targeted by this assay, and inadequate number of viral copies  (<250 copies / mL). A negative result must be combined with clinical  observations, patient history, and epidemiological information. If result is POSITIVE SARS-CoV-2 target nucleic acids are DETECTED. The SARS-CoV-2 RNA is generally detectable in upper and lower  respiratory specimens  dur ing the acute phase of infection.  Positive  results are indicative of active infection with SARS-CoV-2.  Clinical  correlation with patient history and other diagnostic information is  necessary to determine patient infection status.  Positive results do  not rule out bacterial infection or co-infection with other viruses. If result is PRESUMPTIVE POSTIVE SARS-CoV-2 nucleic acids MAY BE PRESENT.   A presumptive positive result was obtained on the submitted specimen  and confirmed on repeat testing.  While 2019 novel coronavirus  (SARS-CoV-2) nucleic acids may be present in the submitted sample  additional confirmatory testing may be necessary for epidemiological  and / or clinical management purposes  to differentiate between  SARS-CoV-2 and other Sarbecovirus currently known to infect humans.  If clinically indicated additional testing with an alternate test  methodology [phone]) is advised. The SARS-CoV-2 RNA is generally  detectable in upper and lower respiratory sp ecimens during the acute  phase of infection. The expected result is Negative. Fact Sheet for Patients:  StrictlyIdeas.no Fact Sheet for Healthcare Providers: BankingDealers.co.za This test is not yet approved or cleared by the [place] FDA and has been authorized for detection and/or diagnosis of SARS-CoV-2 by FDA under an Emergency Use Authorization (EUA).  This EUA will remain in effect (meaning this test can be used) for the duration of the COVID-19 declaration under Section 564(b)(1) of the Act, 21 U.S.C. section 360bbb-3(b)(1), unless the authorization is terminated or revoked sooner. Performed at [facility], [place] [address], [place], [place] [address]      Labs: BNP (last 3 results) No results for input(s): BNP in the last 8760 hours. Basic Metabolic Panel: Recent Labs  Lab 05/15/18 1328 05/16/18 1919 05/16/18 1928 05/18/18 1822  05/19/18 0648  NA 141 142 142 141 140  K 3.7 3.4* 3.3* 3.1* 3.8  CL 106 108 109 110 112*  CO2 26 23  --  23 20*  GLUCOSE 190* 91 93 145* 90  BUN 32* 31* 30* 22 20  CREATININE 2.09* 1.93* 2.00* 1.71* 1.62*  CALCIUM 9.2 9.6  --  9.0 9.2  MG 1.8  --   --   --   --    Liver Function Tests: Recent Labs  Lab 05/15/18 1328 05/16/18 1919  AST 18 25  ALT 12 18  ALKPHOS 66 74  BILITOT 0.6 0.5  PROT 6.8 7.2  ALBUMIN 3.4* 3.5   No results for input(s): LIPASE, AMYLASE in the last 168 hours. No results for input(s): AMMONIA in the last 168 hours. CBC: Recent Labs  Lab 05/15/18 1328 05/16/18 1919 05/16/18 1928 05/18/18 1822 05/19/18 0648  WBC 5.9 5.9  --  4.9 5.3  NEUTROABS 3.5 3.2  --   --   --   HGB 12.1 11.7* 12.2 11.3* 11.5*  HCT 36.1 36.5 36.0 34.7* 34.4*  MCV 97.6 99.2  --  97.5 97.7  PLT 201.0 212  --  214 236   Cardiac Enzymes: Recent Labs  Lab 05/16/18 2347 05/17/18 0514 05/17/18 1138  TROPONINI 0.04* 0.03* 0.04*   BNP: Invalid input(s): POCBNP CBG: Recent Labs  Lab 05/18/18 1148 05/18/18 1629 05/18/18 2139 05/19/18 0628 05/19/18 1218  GLUCAP 91 148* 89 88 112*   D-Dimer No results for input(s): DDIMER in the last 72 hours. Hgb A1c No results for input(s): HGBA1C in the last 72 hours. Lipid Profile No results for input(s): CHOL, HDL, LDLCALC, TRIG, CHOLHDL, LDLDIRECT in the last 72 hours. Thyroid function studies No results for input(s): TSH, T4TOTAL, T3FREE, THYROIDAB in the last 72 hours.  Invalid input(s): FREET3 Anemia work up No results for input(s): VITAMINB12, FOLATE, FERRITIN, TIBC, IRON, RETICCTPCT in the last 72 hours. Urinalysis    Component Value Date/Time   COLORURINE STRAW (A) 05/17/2018 2213   APPEARANCEUR CLEAR 05/17/2018 2213   LABSPEC 1.009 05/17/2018 2213   PHURINE 7.0 05/17/2018 2213   GLUCOSEU NEGATIVE 05/17/2018 2213   GLUCOSEU NEGATIVE 05/15/2018 1516   HGBUR NEGATIVE 05/17/2018 2213   BILIRUBINUR NEGATIVE 05/17/2018  2213   KETONESUR NEGATIVE 05/17/2018 2213   PROTEINUR 100 (A) 05/17/2018 2213   UROBILINOGEN 0.2 05/15/2018 1516   NITRITE NEGATIVE 05/17/2018 2213   [place] 05/17/2018 2213   Sepsis Labs Invalid input(s): PROCALCITONIN,  WBC,  LACTICIDVEN  FURTHER DISCHARGE INSTRUCTIONS:   Get Medicines reviewed and adjusted: Please take all your medications with you for your next visit with your Primary MD   Laboratory/radiological data: Please request your Primary MD to go over all hospital tests and procedure/radiological results at the follow up, please ask your Primary MD to get all Hospital records sent to his/her office.   In some cases, they will be blood work, cultures and biopsy results pending at the time of your discharge. Please request that your primary care M.D. goes through all the records of your hospital data and follows up on these results.   Also Note the following: If you experience worsening of your admission symptoms, develop shortness of breath, life threatening emergency, suicidal or homicidal thoughts you must seek medical attention immediately by calling 911 or calling your MD immediately  if symptoms less severe.   You must read complete instructions/literature along with all the possible adverse reactions/side effects for all the Medicines you take and that have been prescribed to you. Take  any new Medicines after you have completely understood and accpet all the possible adverse reactions/side effects.    Do not drive when taking Pain medications or sleeping medications (Benzodaizepines)   Do not take more than prescribed Pain, Sleep and Anxiety Medications. It is not advisable to combine anxiety,sleep and pain medications without talking with your primary care practitioner   Special Instructions: If you have smoked or chewed Tobacco  in the last 2 yrs please stop smoking, stop any regular Alcohol  and or any Recreational drug use.   Wear Seat belts while  driving.   Please note: You were cared for by a hospitalist during your hospital stay. Once you are discharged, your primary care physician will handle any further medical issues. Please note that NO REFILLS for any discharge medications will be authorized once you are discharged, as it is imperative that you return to your primary care physician (or establish a relationship with a primary care physician if you do not have one) for your post hospital discharge needs so that they can reassess your need for medications and monitor your lab values.  Time coordinating discharge: 45 minutes  SIGNED:  Marzetta Board, MD, PhD 05/19/2018, 4:10 PM

## 2018-05-19 NOTE — Telephone Encounter (Signed)
Copied from [place] [phone]. Topic: Quick Communication - Rx Refill/Question >> May 19, 2018  2:11 PM Scherrie Gerlach wrote: Medication: glucose meter ACCU CHECK GUIDE //accu check test strips,// lancets Pharmacy calling to advise: Rx for a one touch sent, but her insurance will not cover this one, will cover accu check.  They need new Rx sent with dx code for each and please write the number of times pt to test a day, not use as directed [address] [place], [place] [phone] (Phone) [phone] (Fax)

## 2018-05-20 ENCOUNTER — Telehealth: Payer: Self-pay | Admitting: *Deleted

## 2018-05-20 ENCOUNTER — Encounter (HOSPITAL_COMMUNITY): Payer: Self-pay | Admitting: Internal Medicine

## 2018-05-20 MED ORDER — BLOOD GLUCOSE MONITOR KIT: PACK | 0 refills | Status: DC

## 2018-05-20 NOTE — Telephone Encounter (Signed)
Done hardcopy to shirron 

## 2018-05-20 NOTE — Telephone Encounter (Signed)
Transition Care Management Follow-up Telephone Call   Date discharged? 05/19/18   How have you been since you were released from the hospital? Spoke w/son he states mom is doing ok   Do you understand why you were in the hospital? YES   Do you understand the discharge instructions? YES   Where were you discharged to? Home   Items Reviewed:  Medications reviewed: YES  Allergies reviewed: YES  Dietary changes reviewed: YES, heart healthy  Referrals reviewed: YES, [facility] states [facility] today    Functional Questionnaire:   Activities of Daily Living (ADLs):   He states she are independent in the following: bathing and hygiene, feeding, continence, grooming, toileting and dressing States they require assistance with the following: ambulation   Any transportation issues/concerns?: NO   Any patient concerns? NO   Confirmed importance and date/time of follow-up visits scheduled YES, (virtual) appt 05/27/18  Provider Appointment booked with Dr. Jenny Reichmann   Confirmed with patient if condition begins to worsen call PCP or go to the ER.  Patient was given the office number and encouraged to call back with question or concerns.  : YES

## 2018-05-21 ENCOUNTER — Telehealth: Payer: Self-pay

## 2018-05-21 DIAGNOSIS — Z7982 Long term (current) use of aspirin: Secondary | ICD-10-CM | POA: Diagnosis not present

## 2018-05-21 DIAGNOSIS — H53461 Homonymous bilateral field defects, right side: Secondary | ICD-10-CM | POA: Diagnosis not present

## 2018-05-21 DIAGNOSIS — M6281 Muscle weakness (generalized): Secondary | ICD-10-CM | POA: Diagnosis not present

## 2018-05-21 DIAGNOSIS — N184 Chronic kidney disease, stage 4 (severe): Secondary | ICD-10-CM | POA: Diagnosis not present

## 2018-05-21 DIAGNOSIS — Z7902 Long term (current) use of antithrombotics/antiplatelets: Secondary | ICD-10-CM | POA: Diagnosis not present

## 2018-05-21 DIAGNOSIS — I129 Hypertensive chronic kidney disease with stage 1 through stage 4 chronic kidney disease, or unspecified chronic kidney disease: Secondary | ICD-10-CM | POA: Diagnosis not present

## 2018-05-21 DIAGNOSIS — E1122 Type 2 diabetes mellitus with diabetic chronic kidney disease: Secondary | ICD-10-CM | POA: Diagnosis not present

## 2018-05-21 DIAGNOSIS — I6931 Attention and concentration deficit following cerebral infarction: Secondary | ICD-10-CM | POA: Diagnosis not present

## 2018-05-21 DIAGNOSIS — I69398 Other sequelae of cerebral infarction: Secondary | ICD-10-CM | POA: Diagnosis not present

## 2018-05-21 NOTE — Telephone Encounter (Signed)
Pt's son has been informed of results and expressed understanding.

## 2018-05-21 NOTE — Telephone Encounter (Signed)
-----   Message from Biagio Borg, MD sent at 05/17/2018 11:03 AM EDT ----- Left message on MyChart, pt to cont same tx  The test results show that your current treatment is OK, as the tests are ok, except the Vitamin D level is low.  I understand you had the MRI which showed the stroke, and this is less important, but we need to send a prescription for Vitamin D to your pharmacy at 50000 units per week for 12 wks.  After that, please plan to take 2000 units per day from OTC Vitamin D3.[name]    Kimberly Castillo to please inform pt, I will do rx

## 2018-05-22 ENCOUNTER — Telehealth: Payer: Self-pay | Admitting: Internal Medicine

## 2018-05-22 ENCOUNTER — Telehealth: Payer: Self-pay | Admitting: Emergency Medicine

## 2018-05-22 DIAGNOSIS — I6931 Attention and concentration deficit following cerebral infarction: Secondary | ICD-10-CM | POA: Diagnosis not present

## 2018-05-22 DIAGNOSIS — Z7902 Long term (current) use of antithrombotics/antiplatelets: Secondary | ICD-10-CM | POA: Diagnosis not present

## 2018-05-22 DIAGNOSIS — I129 Hypertensive chronic kidney disease with stage 1 through stage 4 chronic kidney disease, or unspecified chronic kidney disease: Secondary | ICD-10-CM | POA: Diagnosis not present

## 2018-05-22 DIAGNOSIS — Z7982 Long term (current) use of aspirin: Secondary | ICD-10-CM | POA: Diagnosis not present

## 2018-05-22 DIAGNOSIS — H53461 Homonymous bilateral field defects, right side: Secondary | ICD-10-CM | POA: Diagnosis not present

## 2018-05-22 DIAGNOSIS — N184 Chronic kidney disease, stage 4 (severe): Secondary | ICD-10-CM | POA: Diagnosis not present

## 2018-05-22 DIAGNOSIS — E1122 Type 2 diabetes mellitus with diabetic chronic kidney disease: Secondary | ICD-10-CM | POA: Diagnosis not present

## 2018-05-22 DIAGNOSIS — I69398 Other sequelae of cerebral infarction: Secondary | ICD-10-CM | POA: Diagnosis not present

## 2018-05-22 DIAGNOSIS — M6281 Muscle weakness (generalized): Secondary | ICD-10-CM | POA: Diagnosis not present

## 2018-05-22 NOTE — Telephone Encounter (Signed)
Verbal orders given  

## 2018-05-22 NOTE — Telephone Encounter (Signed)
Copied from [place] [phone]. Topic: Quick Communication - Home Health Verbal Orders >> May 22, 2018  8:46 AM Mathis Bud wrote: Caller/Agency: [facility] Number: [phone] Requesting PT Frequency: starting process  for verbal order

## 2018-05-22 NOTE — Telephone Encounter (Signed)
Copied and placed in the correct chart.

## 2018-05-22 NOTE — Telephone Encounter (Signed)
Copied from [place] [phone]. Topic: Quick Communication - Home Health Verbal Orders >> May 22, 2018  2:56 PM Luciana Axe wrote: Caller/Agency: [facility] Number: [phone] Requesting OT/PT/Skilled Nursing/Social Work/Speech Therapy: PT Frequency: 1 time 1 week, 2 week 1, 1 week 7

## 2018-05-22 NOTE — Telephone Encounter (Signed)
Noted  

## 2018-05-22 NOTE — Telephone Encounter (Signed)
Spoke with Clair Gulling to give verbal orders per MD.

## 2018-05-22 NOTE — Telephone Encounter (Signed)
Caller/Agency: [facility] Number: [phone] Requesting OT/PT/Skilled Nursing/Social Work/Speech Therapy: PT Frequency: 1 time 1 week, 2 week 1, 1 week 7

## 2018-05-27 ENCOUNTER — Ambulatory Visit (INDEPENDENT_AMBULATORY_CARE_PROVIDER_SITE_OTHER): Payer: Medicare HMO | Admitting: Internal Medicine

## 2018-05-27 DIAGNOSIS — E1159 Type 2 diabetes mellitus with other circulatory complications: Secondary | ICD-10-CM

## 2018-05-27 DIAGNOSIS — I6931 Attention and concentration deficit following cerebral infarction: Secondary | ICD-10-CM | POA: Diagnosis not present

## 2018-05-27 DIAGNOSIS — N184 Chronic kidney disease, stage 4 (severe): Secondary | ICD-10-CM | POA: Diagnosis not present

## 2018-05-27 DIAGNOSIS — I69311 Memory deficit following cerebral infarction: Secondary | ICD-10-CM

## 2018-05-27 DIAGNOSIS — I129 Hypertensive chronic kidney disease with stage 1 through stage 4 chronic kidney disease, or unspecified chronic kidney disease: Secondary | ICD-10-CM | POA: Diagnosis not present

## 2018-05-27 DIAGNOSIS — I69318 Other symptoms and signs involving cognitive functions following cerebral infarction: Secondary | ICD-10-CM

## 2018-05-27 DIAGNOSIS — Z7902 Long term (current) use of antithrombotics/antiplatelets: Secondary | ICD-10-CM | POA: Diagnosis not present

## 2018-05-27 DIAGNOSIS — E785 Hyperlipidemia, unspecified: Secondary | ICD-10-CM | POA: Diagnosis not present

## 2018-05-27 DIAGNOSIS — E1122 Type 2 diabetes mellitus with diabetic chronic kidney disease: Secondary | ICD-10-CM | POA: Diagnosis not present

## 2018-05-27 DIAGNOSIS — E559 Vitamin D deficiency, unspecified: Secondary | ICD-10-CM | POA: Diagnosis not present

## 2018-05-27 DIAGNOSIS — I69398 Other sequelae of cerebral infarction: Secondary | ICD-10-CM | POA: Diagnosis not present

## 2018-05-27 DIAGNOSIS — M6281 Muscle weakness (generalized): Secondary | ICD-10-CM | POA: Diagnosis not present

## 2018-05-27 DIAGNOSIS — H53461 Homonymous bilateral field defects, right side: Secondary | ICD-10-CM | POA: Diagnosis not present

## 2018-05-27 DIAGNOSIS — Z7982 Long term (current) use of aspirin: Secondary | ICD-10-CM | POA: Diagnosis not present

## 2018-05-27 DIAGNOSIS — I639 Cerebral infarction, unspecified: Secondary | ICD-10-CM

## 2018-05-27 NOTE — Telephone Encounter (Signed)
Verbal orders given for wound care and speech therapy.

## 2018-05-27 NOTE — Progress Notes (Signed)
Patient ID: Kimberly Castillo, female   DOB: Jan 09, 1933, 85 y.o.   MRN: [id number]  Virtual Visit via Video Note  I connected with Kimberly Castillo on 05/27/18 at  1:00 PM EDT by a video enabled telemedicine application and verified that I am speaking with the correct person using two identifiers.  Location: Patient: at home with daughter Provider: at office   I discussed the limitations of evaluation and management by telemedicine and the availability of in person appointments. The patient expressed understanding and agreed to proceed.  History of Present Illness: 85 y.o.femalewith medical history significant ofatrial fibrillation, type 2 diabetes mellitus, GERD, and hyperlipidemia is to the emergency department due to 6-day onset of altered mental status. After 5/12 appt chest x-ray and some lab work were ordered. HeadMRI to rule out CVA ordered was done 5/15 and showed large acute to early subacute left PCA territory infarct and patient was asked to go to the ED.  She denied slurred speech or any weakness in the extremities. However she complained ofworsening memory left parietal area headache.  Neurology was consulted for Acute stroke /acute metabolic encephalopathy / right hemianopia and followed patient while hospitalized.  She underwent a full work-up, 2D echo with EF 60-65%, has a degree of diastolic dysfunction.?PFO. Carotids 1-39% on the right,40-59% on left, A1C 6.4, LDL 124, patient was discharged on a statin.  PT recommended home health therapies which were arranged prior to discharge.  She is to continue dual antiplatelet therapy with aspirin and Plavix per neurology for 3 months, then Plavix alone.  With this, clinically patient has been having some short-term memory loss and intermittent confusion which is somewhat improved but still present.  She was d/c home living with her son and 24-hour supervision.  Today pt states using walker at home, no falls, and PT/RN/OT seeing here.  Has monitor  for r/o afib in place.  Has appt to see optho for weakness right eye.   Pt denies fever, wt loss, night sweats, loss of appetite, or other constitutional symptoms   Past Medical History:  Diagnosis Date  . A-fib ([place])   . Anxiety state 12/22/2013  . Arthritis    hands, back  . Arthritis, lumbar spine 12/19/2012  . Cataracts, bilateral   . Chronic lower back pain 12/19/2012  . Diabetes mellitus ([place])   . Diverticulitis   . Dysrhythmia   . GERD (gastroesophageal reflux disease)   . Glaucoma   . Headache    hx migraines  . Heart murmur   . History of shingles   . Hyperlipidemia   . Hypertension   . Numbness    rt side of rt leg  . Post herpetic neuralgia 12/19/2012   Past Surgical History:  Procedure Laterality Date  . ABDOMINAL HYSTERECTOMY  1964   partial  . COLON RESECTION N/A 09/02/2013   Procedure: [place] colectomy;  Surgeon: Excell Seltzer, MD;  Location: [facility];  Service: General;  Laterality: N/A;  . COLONOSCOPY    . COLOSTOMY TAKEDOWN N/A 11/09/2014   Procedure: TAKEDOWN [place] COLOSTOMY;  Surgeon: Excell Seltzer, MD;  Location: [facility];  Service: General;  Laterality: N/A;  . [place]   left  . LOOP RECORDER INSERTION N/A 05/19/2018   Procedure: LOOP RECORDER INSERTION;  Surgeon: Evans Lance, MD;  Location: [facility];  Service: Cardiovascular;  Laterality: N/A;  . POLYPECTOMY      reports that she quit smoking about 20 years ago.  Her smoking use included cigarettes. She started smoking about 26 years ago. She has never used smokeless tobacco. She reports that she does not drink alcohol or use drugs. family history includes Hypertension in her maternal grandmother and mother. No Known Allergies Current Outpatient Medications on File Prior to Visit  Medication Sig Dispense Refill  . aspirin EC 325 MG EC tablet Take 1 tablet (325 mg total) by mouth daily. 30 tablet 0  . atorvastatin (LIPITOR) 80 MG tablet Take 1  tablet (80 mg total) by mouth daily at 6 PM. 30 tablet 1  . blood glucose meter kit and supplies KIT Use up to four times daily as directed. E11.9 1 each 0  . Blood Glucose Monitoring Suppl (ONE TOUCH ULTRA 2) w/Device KIT USE as directed 1 each 0  . clopidogrel (PLAVIX) 75 MG tablet Take 1 tablet (75 mg total) by mouth daily. 30 tablet 1  . diltiazem (CARDIZEM) 120 MG tablet Take 1 tablet (120 mg total) by mouth daily. For heart and blood pressure 90 tablet 3  . Lancets MISC Use as directed once daily 100 each 11  . latanoprost (XALATAN) 0.005 % ophthalmic solution Place 1 drop into both eyes at bedtime.    . metFORMIN (GLUCOPHAGE) 500 MG tablet Take 1 tablet (500 mg total) by mouth 2 (two) times daily with a meal. For diabetes 180 tablet 3  . metoprolol tartrate (LOPRESSOR) 25 MG tablet Take 1 tablet (25 mg total) by mouth 2 (two) times daily. 60 tablet 1  . timolol (BETIMOL) 0.5 % ophthalmic solution Place 1 drop into both eyes daily.    . Vitamin D, Ergocalciferol, (DRISDOL) 1.25 MG (50000 UT) CAPS capsule Take 1 capsule (50,000 Units total) by mouth every 7 (seven) days. 12 capsule 0   No current facility-administered medications on file prior to visit.     Observations/Objective: Alert, NAD, appropriate mood and affect, resps normal, cn 2-12 intact, moves all 4s, no visible rash or swelling Lab Results  Component Value Date   WBC 5.3 05/19/2018   HGB 11.5 (L) 05/19/2018   HCT 34.4 (L) 05/19/2018   PLT 236 05/19/2018   GLUCOSE 90 05/19/2018   CHOL 211 (H) 05/15/2018   TRIG 198.0 (H) 05/15/2018   HDL 47.50 05/15/2018   LDLDIRECT 129.0 02/02/2016   LDLCALC 124 (H) 05/15/2018   ALT 18 05/16/2018   AST 25 05/16/2018   NA 140 05/19/2018   K 3.8 05/19/2018   CL 112 (H) 05/19/2018   CREATININE 1.62 (H) 05/19/2018   BUN 20 05/19/2018   CO2 20 (L) 05/19/2018   TSH 1.06 05/15/2018   INR 1.1 05/16/2018   HGBA1C 6.4 05/15/2018   MICROALBUR 360.2 (H) 05/15/2018   Assessment and  Plan: See notes  Follow Up Instructions: See notes   I discussed the assessment and treatment plan with the patient. The patient was provided an opportunity to ask questions and all were answered. The patient agreed with the plan and demonstrated an understanding of the instructions.   The patient was advised to call back or seek an in-person evaluation if the symptoms worsen or if the condition fails to improve as anticipated   Cathlean Cower, MD

## 2018-05-27 NOTE — Patient Instructions (Signed)
Please continue all other medications as before, and refills have been done if requested.  Please have the pharmacy call with any other refills you may need.  Please continue your efforts at being more active, low cholesterol diet, and weight control.  Please keep your appointments with your specialists as you may have planned  We'll see you June 12 at your next appt

## 2018-05-27 NOTE — Telephone Encounter (Signed)
Pt son called and stated that he would like wound care from [facility]. Pt son would like a call back regarding. Please advise.

## 2018-05-28 ENCOUNTER — Telehealth: Payer: Self-pay

## 2018-05-28 DIAGNOSIS — I6931 Attention and concentration deficit following cerebral infarction: Secondary | ICD-10-CM | POA: Diagnosis not present

## 2018-05-28 DIAGNOSIS — Z7982 Long term (current) use of aspirin: Secondary | ICD-10-CM | POA: Diagnosis not present

## 2018-05-28 DIAGNOSIS — I129 Hypertensive chronic kidney disease with stage 1 through stage 4 chronic kidney disease, or unspecified chronic kidney disease: Secondary | ICD-10-CM | POA: Diagnosis not present

## 2018-05-28 DIAGNOSIS — E1122 Type 2 diabetes mellitus with diabetic chronic kidney disease: Secondary | ICD-10-CM | POA: Diagnosis not present

## 2018-05-28 DIAGNOSIS — N184 Chronic kidney disease, stage 4 (severe): Secondary | ICD-10-CM | POA: Diagnosis not present

## 2018-05-28 DIAGNOSIS — H53461 Homonymous bilateral field defects, right side: Secondary | ICD-10-CM | POA: Diagnosis not present

## 2018-05-28 DIAGNOSIS — I69398 Other sequelae of cerebral infarction: Secondary | ICD-10-CM | POA: Diagnosis not present

## 2018-05-28 DIAGNOSIS — Z7902 Long term (current) use of antithrombotics/antiplatelets: Secondary | ICD-10-CM | POA: Diagnosis not present

## 2018-05-28 DIAGNOSIS — M6281 Muscle weakness (generalized): Secondary | ICD-10-CM | POA: Diagnosis not present

## 2018-05-28 NOTE — Telephone Encounter (Signed)
Received an alert for "AF" episode on 05/27/18 @ 1128, duration 4 min. Appears possible SR with oversensing, PVCs, and artifact. Will route to Dr. Lovena Le for review.

## 2018-05-28 NOTE — Telephone Encounter (Signed)
noise

## 2018-05-29 ENCOUNTER — Ambulatory Visit: Payer: Medicare HMO

## 2018-05-29 ENCOUNTER — Telehealth: Payer: Self-pay | Admitting: Internal Medicine

## 2018-05-29 DIAGNOSIS — E1122 Type 2 diabetes mellitus with diabetic chronic kidney disease: Secondary | ICD-10-CM | POA: Diagnosis not present

## 2018-05-29 DIAGNOSIS — I6931 Attention and concentration deficit following cerebral infarction: Secondary | ICD-10-CM | POA: Diagnosis not present

## 2018-05-29 DIAGNOSIS — I69398 Other sequelae of cerebral infarction: Secondary | ICD-10-CM | POA: Diagnosis not present

## 2018-05-29 DIAGNOSIS — N184 Chronic kidney disease, stage 4 (severe): Secondary | ICD-10-CM | POA: Diagnosis not present

## 2018-05-29 DIAGNOSIS — M6281 Muscle weakness (generalized): Secondary | ICD-10-CM | POA: Diagnosis not present

## 2018-05-29 DIAGNOSIS — H53461 Homonymous bilateral field defects, right side: Secondary | ICD-10-CM | POA: Diagnosis not present

## 2018-05-29 DIAGNOSIS — I129 Hypertensive chronic kidney disease with stage 1 through stage 4 chronic kidney disease, or unspecified chronic kidney disease: Secondary | ICD-10-CM | POA: Diagnosis not present

## 2018-05-29 DIAGNOSIS — Z7902 Long term (current) use of antithrombotics/antiplatelets: Secondary | ICD-10-CM | POA: Diagnosis not present

## 2018-05-29 DIAGNOSIS — Z7982 Long term (current) use of aspirin: Secondary | ICD-10-CM | POA: Diagnosis not present

## 2018-05-29 NOTE — Telephone Encounter (Signed)
Copied from [place] [phone]. Topic: Quick Communication - Home Health Verbal Orders >> May 29, 2018  3:29 PM Celene Kras A wrote: Caller/Agency: [facility]  Callback Number: [phone] [id number] Requesting OT/PT/Skilled Nursing/Social Work/Speech Therapy: Speech therapy Frequency: 2x for 2wks

## 2018-05-29 NOTE — Telephone Encounter (Signed)
Verbal orders given via VM 

## 2018-05-30 ENCOUNTER — Telehealth: Payer: Self-pay

## 2018-05-30 ENCOUNTER — Telehealth: Payer: Self-pay | Admitting: Internal Medicine

## 2018-05-30 DIAGNOSIS — I69398 Other sequelae of cerebral infarction: Secondary | ICD-10-CM | POA: Diagnosis not present

## 2018-05-30 DIAGNOSIS — N184 Chronic kidney disease, stage 4 (severe): Secondary | ICD-10-CM | POA: Diagnosis not present

## 2018-05-30 DIAGNOSIS — H53461 Homonymous bilateral field defects, right side: Secondary | ICD-10-CM | POA: Diagnosis not present

## 2018-05-30 DIAGNOSIS — K2100 Gastro-esophageal reflux disease with esophagitis, without bleeding: Secondary | ICD-10-CM

## 2018-05-30 DIAGNOSIS — Z7982 Long term (current) use of aspirin: Secondary | ICD-10-CM | POA: Diagnosis not present

## 2018-05-30 DIAGNOSIS — I129 Hypertensive chronic kidney disease with stage 1 through stage 4 chronic kidney disease, or unspecified chronic kidney disease: Secondary | ICD-10-CM | POA: Diagnosis not present

## 2018-05-30 DIAGNOSIS — E1122 Type 2 diabetes mellitus with diabetic chronic kidney disease: Secondary | ICD-10-CM | POA: Diagnosis not present

## 2018-05-30 DIAGNOSIS — I6931 Attention and concentration deficit following cerebral infarction: Secondary | ICD-10-CM | POA: Diagnosis not present

## 2018-05-30 DIAGNOSIS — Z7902 Long term (current) use of antithrombotics/antiplatelets: Secondary | ICD-10-CM | POA: Diagnosis not present

## 2018-05-30 DIAGNOSIS — M6281 Muscle weakness (generalized): Secondary | ICD-10-CM | POA: Diagnosis not present

## 2018-05-30 MED ORDER — PANTOPRAZOLE SODIUM 40 MG PO TBEC: 40.0000 mg | DELAYED_RELEASE_TABLET | Freq: Every day | ORAL | 0 refills | Status: DC

## 2018-05-30 NOTE — Telephone Encounter (Signed)
Resent   Copied from [place] [phone]. Topic: General - Other >> May 30, 2018 11:36 AM Oneta Rack wrote: Osvaldo Human name: Alba Cory  Relation to pt: RN from [facility]  Call back number: [place], [place] [phone] (Phone) [phone] (Fax)    Reason for call:  Nurse spoke with pharmacist today and there denying receiving pantoprazole (PROTONIX) 40 MG tablet, please re send Rx.

## 2018-05-30 NOTE — Telephone Encounter (Signed)
Copied from [place] [phone]. Topic: Quick Communication - See Telephone Encounter >> May 30, 2018  4:44 PM Sheran Luz wrote: CRM for notification. See Telephone encounter for: 05/30/18.   Clair Gulling, PT with [facility], called needing confirmation of diagnosis. He also wanted to note after affect of CV with reduced strength and vision.   fax# [phone]

## 2018-06-01 ENCOUNTER — Encounter: Payer: Self-pay | Admitting: Internal Medicine

## 2018-06-01 NOTE — Assessment & Plan Note (Signed)
stable overall by history and exam, recent data reviewed with pt, and pt to continue medical treatment as before,  to f/u any worsening symptoms or concerns  

## 2018-06-01 NOTE — Assessment & Plan Note (Signed)
stable overall by history and exam, recent data reviewed with pt, and pt to continue medical treatment as before,  to f/u any worsening symptoms or concerns, to cont plavix and asa x 3 mo, then plavix only

## 2018-06-01 NOTE — Assessment & Plan Note (Signed)
For lab f/u next visit

## 2018-06-02 NOTE — Telephone Encounter (Signed)
Diagnosis is "acute stroke", thanks

## 2018-06-02 NOTE — Telephone Encounter (Signed)
Called Jim receive automatic msg stating he is on vacation. Left # to contact office @ [phone] ext [phone]. Called office spoke w/ shonda gave md response.Marland KitchenJohny Chess

## 2018-06-03 DIAGNOSIS — N184 Chronic kidney disease, stage 4 (severe): Secondary | ICD-10-CM | POA: Diagnosis not present

## 2018-06-03 DIAGNOSIS — I129 Hypertensive chronic kidney disease with stage 1 through stage 4 chronic kidney disease, or unspecified chronic kidney disease: Secondary | ICD-10-CM | POA: Diagnosis not present

## 2018-06-03 DIAGNOSIS — I69398 Other sequelae of cerebral infarction: Secondary | ICD-10-CM | POA: Diagnosis not present

## 2018-06-03 DIAGNOSIS — Z7982 Long term (current) use of aspirin: Secondary | ICD-10-CM | POA: Diagnosis not present

## 2018-06-03 DIAGNOSIS — M6281 Muscle weakness (generalized): Secondary | ICD-10-CM | POA: Diagnosis not present

## 2018-06-03 DIAGNOSIS — I6931 Attention and concentration deficit following cerebral infarction: Secondary | ICD-10-CM | POA: Diagnosis not present

## 2018-06-03 DIAGNOSIS — Z7902 Long term (current) use of antithrombotics/antiplatelets: Secondary | ICD-10-CM | POA: Diagnosis not present

## 2018-06-03 DIAGNOSIS — H53461 Homonymous bilateral field defects, right side: Secondary | ICD-10-CM | POA: Diagnosis not present

## 2018-06-03 DIAGNOSIS — E1122 Type 2 diabetes mellitus with diabetic chronic kidney disease: Secondary | ICD-10-CM | POA: Diagnosis not present

## 2018-06-03 NOTE — Telephone Encounter (Signed)
Noted  

## 2018-06-04 DIAGNOSIS — H53461 Homonymous bilateral field defects, right side: Secondary | ICD-10-CM | POA: Diagnosis not present

## 2018-06-04 DIAGNOSIS — E1122 Type 2 diabetes mellitus with diabetic chronic kidney disease: Secondary | ICD-10-CM | POA: Diagnosis not present

## 2018-06-04 DIAGNOSIS — Z7982 Long term (current) use of aspirin: Secondary | ICD-10-CM | POA: Diagnosis not present

## 2018-06-04 DIAGNOSIS — I6931 Attention and concentration deficit following cerebral infarction: Secondary | ICD-10-CM | POA: Diagnosis not present

## 2018-06-04 DIAGNOSIS — I129 Hypertensive chronic kidney disease with stage 1 through stage 4 chronic kidney disease, or unspecified chronic kidney disease: Secondary | ICD-10-CM | POA: Diagnosis not present

## 2018-06-04 DIAGNOSIS — I69398 Other sequelae of cerebral infarction: Secondary | ICD-10-CM | POA: Diagnosis not present

## 2018-06-04 DIAGNOSIS — M6281 Muscle weakness (generalized): Secondary | ICD-10-CM | POA: Diagnosis not present

## 2018-06-04 DIAGNOSIS — N184 Chronic kidney disease, stage 4 (severe): Secondary | ICD-10-CM | POA: Diagnosis not present

## 2018-06-04 DIAGNOSIS — Z7902 Long term (current) use of antithrombotics/antiplatelets: Secondary | ICD-10-CM | POA: Diagnosis not present

## 2018-06-05 DIAGNOSIS — N184 Chronic kidney disease, stage 4 (severe): Secondary | ICD-10-CM | POA: Diagnosis not present

## 2018-06-05 DIAGNOSIS — M6281 Muscle weakness (generalized): Secondary | ICD-10-CM | POA: Diagnosis not present

## 2018-06-05 DIAGNOSIS — E1122 Type 2 diabetes mellitus with diabetic chronic kidney disease: Secondary | ICD-10-CM | POA: Diagnosis not present

## 2018-06-05 DIAGNOSIS — H53461 Homonymous bilateral field defects, right side: Secondary | ICD-10-CM | POA: Diagnosis not present

## 2018-06-05 DIAGNOSIS — I69398 Other sequelae of cerebral infarction: Secondary | ICD-10-CM | POA: Diagnosis not present

## 2018-06-05 DIAGNOSIS — Z7982 Long term (current) use of aspirin: Secondary | ICD-10-CM | POA: Diagnosis not present

## 2018-06-05 DIAGNOSIS — I129 Hypertensive chronic kidney disease with stage 1 through stage 4 chronic kidney disease, or unspecified chronic kidney disease: Secondary | ICD-10-CM | POA: Diagnosis not present

## 2018-06-05 DIAGNOSIS — Z7902 Long term (current) use of antithrombotics/antiplatelets: Secondary | ICD-10-CM | POA: Diagnosis not present

## 2018-06-05 DIAGNOSIS — I6931 Attention and concentration deficit following cerebral infarction: Secondary | ICD-10-CM | POA: Diagnosis not present

## 2018-06-06 ENCOUNTER — Telehealth: Payer: Self-pay | Admitting: Internal Medicine

## 2018-06-06 NOTE — Telephone Encounter (Signed)
Copied from [place] [phone]. Topic: Quick Communication - Home Health Verbal Orders >> Jun 06, 2018  3:45 PM Nils Flack wrote: [facility]er[facility]Agency[facility]  Callback Number: [phone] Requesting OT/PT/Skilled Nursing/Social Work/Speech Therapy: nursing  Frequency: is it ok to move this weeks appt to next week to spread thing out? Please call back

## 2018-06-06 NOTE — Telephone Encounter (Signed)
Verbal orders given to spread visits out.

## 2018-06-09 ENCOUNTER — Telehealth: Payer: Self-pay | Admitting: Adult Health

## 2018-06-09 NOTE — Telephone Encounter (Signed)
°  Due to current COVID 19 pandemic, our office is severely reducing in office visits until further notice, in order to minimize the risk to our patients and healthcare providers.    Called patient and scheduled a virtual visit with Kimberly Castillo for 6/22. Pt understands that although there may be some limitations with this type of visit, we will take all precautions to reduce any security or privacy concerns.  Pt understands that this will be treated like an in office visit and we will file with pt's insurance, and there may be a patient responsible charge related to this service.  I also spoke with patient's son Jeneen Rinks and went over [email or web address] and he verbalized understanding of the process. I have sent an e-mail to [email or web address] with link and instructions as well as my name and our office number. They understands that they will receive a call from RN to update chart.

## 2018-06-10 DIAGNOSIS — Z7902 Long term (current) use of antithrombotics/antiplatelets: Secondary | ICD-10-CM | POA: Diagnosis not present

## 2018-06-10 DIAGNOSIS — M6281 Muscle weakness (generalized): Secondary | ICD-10-CM | POA: Diagnosis not present

## 2018-06-10 DIAGNOSIS — H53461 Homonymous bilateral field defects, right side: Secondary | ICD-10-CM | POA: Diagnosis not present

## 2018-06-10 DIAGNOSIS — I69398 Other sequelae of cerebral infarction: Secondary | ICD-10-CM | POA: Diagnosis not present

## 2018-06-10 DIAGNOSIS — Z7982 Long term (current) use of aspirin: Secondary | ICD-10-CM | POA: Diagnosis not present

## 2018-06-10 DIAGNOSIS — E1122 Type 2 diabetes mellitus with diabetic chronic kidney disease: Secondary | ICD-10-CM | POA: Diagnosis not present

## 2018-06-10 DIAGNOSIS — I129 Hypertensive chronic kidney disease with stage 1 through stage 4 chronic kidney disease, or unspecified chronic kidney disease: Secondary | ICD-10-CM | POA: Diagnosis not present

## 2018-06-10 DIAGNOSIS — I6931 Attention and concentration deficit following cerebral infarction: Secondary | ICD-10-CM | POA: Diagnosis not present

## 2018-06-10 DIAGNOSIS — N184 Chronic kidney disease, stage 4 (severe): Secondary | ICD-10-CM | POA: Diagnosis not present

## 2018-06-12 DIAGNOSIS — Z7982 Long term (current) use of aspirin: Secondary | ICD-10-CM | POA: Diagnosis not present

## 2018-06-12 DIAGNOSIS — M6281 Muscle weakness (generalized): Secondary | ICD-10-CM | POA: Diagnosis not present

## 2018-06-12 DIAGNOSIS — I69398 Other sequelae of cerebral infarction: Secondary | ICD-10-CM | POA: Diagnosis not present

## 2018-06-12 DIAGNOSIS — I129 Hypertensive chronic kidney disease with stage 1 through stage 4 chronic kidney disease, or unspecified chronic kidney disease: Secondary | ICD-10-CM | POA: Diagnosis not present

## 2018-06-12 DIAGNOSIS — Z7902 Long term (current) use of antithrombotics/antiplatelets: Secondary | ICD-10-CM | POA: Diagnosis not present

## 2018-06-12 DIAGNOSIS — H53461 Homonymous bilateral field defects, right side: Secondary | ICD-10-CM | POA: Diagnosis not present

## 2018-06-12 DIAGNOSIS — N184 Chronic kidney disease, stage 4 (severe): Secondary | ICD-10-CM | POA: Diagnosis not present

## 2018-06-12 DIAGNOSIS — E1122 Type 2 diabetes mellitus with diabetic chronic kidney disease: Secondary | ICD-10-CM | POA: Diagnosis not present

## 2018-06-12 DIAGNOSIS — I6931 Attention and concentration deficit following cerebral infarction: Secondary | ICD-10-CM | POA: Diagnosis not present

## 2018-06-13 ENCOUNTER — Ambulatory Visit: Payer: Medicare HMO | Admitting: Internal Medicine

## 2018-06-16 DIAGNOSIS — I69398 Other sequelae of cerebral infarction: Secondary | ICD-10-CM | POA: Diagnosis not present

## 2018-06-16 DIAGNOSIS — I6931 Attention and concentration deficit following cerebral infarction: Secondary | ICD-10-CM | POA: Diagnosis not present

## 2018-06-16 DIAGNOSIS — Z7982 Long term (current) use of aspirin: Secondary | ICD-10-CM | POA: Diagnosis not present

## 2018-06-16 DIAGNOSIS — E1122 Type 2 diabetes mellitus with diabetic chronic kidney disease: Secondary | ICD-10-CM | POA: Diagnosis not present

## 2018-06-16 DIAGNOSIS — I129 Hypertensive chronic kidney disease with stage 1 through stage 4 chronic kidney disease, or unspecified chronic kidney disease: Secondary | ICD-10-CM | POA: Diagnosis not present

## 2018-06-16 DIAGNOSIS — M6281 Muscle weakness (generalized): Secondary | ICD-10-CM | POA: Diagnosis not present

## 2018-06-16 DIAGNOSIS — Z7902 Long term (current) use of antithrombotics/antiplatelets: Secondary | ICD-10-CM | POA: Diagnosis not present

## 2018-06-16 DIAGNOSIS — H53461 Homonymous bilateral field defects, right side: Secondary | ICD-10-CM | POA: Diagnosis not present

## 2018-06-16 DIAGNOSIS — N184 Chronic kidney disease, stage 4 (severe): Secondary | ICD-10-CM | POA: Diagnosis not present

## 2018-06-18 DIAGNOSIS — Z7982 Long term (current) use of aspirin: Secondary | ICD-10-CM | POA: Diagnosis not present

## 2018-06-18 DIAGNOSIS — M6281 Muscle weakness (generalized): Secondary | ICD-10-CM | POA: Diagnosis not present

## 2018-06-18 DIAGNOSIS — I6931 Attention and concentration deficit following cerebral infarction: Secondary | ICD-10-CM | POA: Diagnosis not present

## 2018-06-18 DIAGNOSIS — H53461 Homonymous bilateral field defects, right side: Secondary | ICD-10-CM | POA: Diagnosis not present

## 2018-06-18 DIAGNOSIS — E1122 Type 2 diabetes mellitus with diabetic chronic kidney disease: Secondary | ICD-10-CM | POA: Diagnosis not present

## 2018-06-18 DIAGNOSIS — I69398 Other sequelae of cerebral infarction: Secondary | ICD-10-CM | POA: Diagnosis not present

## 2018-06-18 DIAGNOSIS — I129 Hypertensive chronic kidney disease with stage 1 through stage 4 chronic kidney disease, or unspecified chronic kidney disease: Secondary | ICD-10-CM | POA: Diagnosis not present

## 2018-06-18 DIAGNOSIS — Z7902 Long term (current) use of antithrombotics/antiplatelets: Secondary | ICD-10-CM | POA: Diagnosis not present

## 2018-06-18 DIAGNOSIS — N184 Chronic kidney disease, stage 4 (severe): Secondary | ICD-10-CM | POA: Diagnosis not present

## 2018-06-19 NOTE — Telephone Encounter (Addendum)
I called pts son to update pts medication list for Monday. I updated med list, allergies and pharmacy with son Jeneen Rinks. I ask pts son did he get the link on 6/8 2020 that was email to him. He could not find the link, so I resent it again. I advise pts son to put the email as a favorite so he can find it on Monday.He verbalized understanding.

## 2018-06-23 ENCOUNTER — Encounter: Payer: Self-pay | Admitting: Adult Health

## 2018-06-23 ENCOUNTER — Ambulatory Visit (INDEPENDENT_AMBULATORY_CARE_PROVIDER_SITE_OTHER): Payer: Medicare HMO | Admitting: Adult Health

## 2018-06-23 ENCOUNTER — Telehealth: Payer: Self-pay

## 2018-06-23 ENCOUNTER — Telehealth: Payer: Self-pay | Admitting: Adult Health

## 2018-06-23 ENCOUNTER — Other Ambulatory Visit: Payer: Self-pay

## 2018-06-23 ENCOUNTER — Ambulatory Visit (INDEPENDENT_AMBULATORY_CARE_PROVIDER_SITE_OTHER): Payer: Medicare HMO | Admitting: *Deleted

## 2018-06-23 DIAGNOSIS — E785 Hyperlipidemia, unspecified: Secondary | ICD-10-CM | POA: Diagnosis not present

## 2018-06-23 DIAGNOSIS — I63432 Cerebral infarction due to embolism of left posterior cerebral artery: Secondary | ICD-10-CM | POA: Diagnosis not present

## 2018-06-23 DIAGNOSIS — Z7902 Long term (current) use of antithrombotics/antiplatelets: Secondary | ICD-10-CM | POA: Diagnosis not present

## 2018-06-23 DIAGNOSIS — E1169 Type 2 diabetes mellitus with other specified complication: Secondary | ICD-10-CM | POA: Diagnosis not present

## 2018-06-23 DIAGNOSIS — I69398 Other sequelae of cerebral infarction: Secondary | ICD-10-CM | POA: Diagnosis not present

## 2018-06-23 DIAGNOSIS — H53461 Homonymous bilateral field defects, right side: Secondary | ICD-10-CM | POA: Diagnosis not present

## 2018-06-23 DIAGNOSIS — I10 Essential (primary) hypertension: Secondary | ICD-10-CM

## 2018-06-23 DIAGNOSIS — I129 Hypertensive chronic kidney disease with stage 1 through stage 4 chronic kidney disease, or unspecified chronic kidney disease: Secondary | ICD-10-CM | POA: Diagnosis not present

## 2018-06-23 DIAGNOSIS — E1122 Type 2 diabetes mellitus with diabetic chronic kidney disease: Secondary | ICD-10-CM | POA: Diagnosis not present

## 2018-06-23 DIAGNOSIS — N184 Chronic kidney disease, stage 4 (severe): Secondary | ICD-10-CM | POA: Diagnosis not present

## 2018-06-23 DIAGNOSIS — Z7982 Long term (current) use of aspirin: Secondary | ICD-10-CM | POA: Diagnosis not present

## 2018-06-23 DIAGNOSIS — I63532 Cerebral infarction due to unspecified occlusion or stenosis of left posterior cerebral artery: Secondary | ICD-10-CM

## 2018-06-23 DIAGNOSIS — I6931 Attention and concentration deficit following cerebral infarction: Secondary | ICD-10-CM | POA: Diagnosis not present

## 2018-06-23 DIAGNOSIS — M6281 Muscle weakness (generalized): Secondary | ICD-10-CM | POA: Diagnosis not present

## 2018-06-23 LAB — CUP PACEART REMOTE DEVICE CHECK
Date Time Interrogation Session: 20200620173959
Implantable Pulse Generator Implant Date: 20200518

## 2018-06-23 MED ORDER — ASPIRIN 81 MG PO TBEC: 81.0000 mg | DELAYED_RELEASE_TABLET | Freq: Every day | ORAL | 0 refills | Status: DC

## 2018-06-23 MED ORDER — CLOPIDOGREL BISULFATE 75 MG PO TABS: 75.0000 mg | ORAL_TABLET | Freq: Every day | ORAL | 3 refills | Status: DC

## 2018-06-23 MED ORDER — ATORVASTATIN CALCIUM 80 MG PO TABS: 80.0000 mg | ORAL_TABLET | Freq: Every day | ORAL | 3 refills | Status: DC

## 2018-06-23 NOTE — Telephone Encounter (Signed)
May Grace from [facility] on [address] called needing to clear up a couple of questions on pt's medications. Please call back as soon as available.

## 2018-06-23 NOTE — Telephone Encounter (Signed)
Transmission received. Will review with Dr. Lovena Le on 06/24/2018.

## 2018-06-23 NOTE — Telephone Encounter (Signed)
Spoke to pt son, walked him through manual transmission. Will review.

## 2018-06-23 NOTE — Patient Instructions (Addendum)
Continue aspirin 81 mg daily and clopidogrel 75 mg daily  and lipitor 80mg   for secondary stroke prevention  Discontinue aspirin and continue plavix only after 08/16/18  Continue to follow up with PCP regarding cholesterol, blood pressure and diabetes management   Continue participation in home therapies for ongoing improvement -please call office once home therapies completed for consideration of additional outpatient therapies  Schedule visit with eye doctor for peripheral vision testing - request results be sent to office   Continue to monitor blood pressure at home  Maintain strict control of hypertension with blood pressure goal below 130/90, diabetes with hemoglobin A1c goal below 6.5% and cholesterol with LDL cholesterol (bad cholesterol) goal below 70 mg/dL. I also advised the patient to eat a healthy diet with plenty of whole grains, cereals, fruits and vegetables, exercise regularly and maintain ideal body weight.  Followup in the future with me in 2 months or call earlier if needed       Thank you for coming to see [place] at [facility]. I hope we have been able to provide you high quality care today.  You may receive a patient satisfaction survey over the next few weeks. We would appreciate your feedback and comments so that we may continue to improve ourselves and the health of our patients.

## 2018-06-23 NOTE — Progress Notes (Signed)
I agree with the above plan 

## 2018-06-23 NOTE — Telephone Encounter (Signed)
Return phone call to Kimberly Castillo at [facility] regarding recent refill of atorvastatin 80 mg daily that was sent during today's visit.  She was previously on simvastatin but this was discontinued during hospitalization and was started on atorvastatin.  Pharmacy wanted to ensure that she was not taking both simvastatin and atorvastatin.  Pharmacist will call patient/son to ensure she has in fact discontinue simvastatin due to recent refill and is continuing on atorvastatin alone.

## 2018-06-23 NOTE — Progress Notes (Signed)
[facility] [address] [place]. [place] [address] [phone]       VIRTUAL VISIT FOLLOW UP NOTE  [name] Date of Birth:  Apr 30, 1933 Medical Record Number:  [id number]   Reason for Referral:  hospital stroke follow up    Virtual Visit via Video Note  I connected with Kimberly Castillo on 06/23/18 at 11:15 AM EDT by a video enabled telemedicine application located at [facility] and verified that I am speaking with the correct person using two identifiers who was located at their own home accompanied by her son.   Visit scheduled by Kimberly Look, RN. She discussed the limitations of evaluation and management by telemedicine and the availability of in person appointments. The patient expressed understanding and agreed to proceed.Please see telephone note for additional scheduling information and consent.    CHIEF COMPLAINT:  Chief Complaint  Patient presents with   Follow-up    hospital stroke f/u    HPI: Kimberly Castillo was initially scheduled today for in office hospital follow-up regarding left PCA infarct on 05/16/2018 but due to COVID-19 safety precautions, visit transition to telemedicine via [email or web address] with patients consent. History obtained from patient, son and chart review. Reviewed all radiology images and labs personally.  Kimberly Castillo is a 85 y.o. female with history of atrial fibrillation not on anticoagulation, diabetes mellitus, hypertension, migraines and hyperlipidemia  who presented with AMS and left sided headache. She did not receive IV t-PA due to late presentation (>4.5 hours from time of onset).  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  MRA showed left distal PCA occlusion and left ICA 40 to 59% stenosis.  2D echo unremarkable.  LDL 124 and A1c 6.4.  Atrial fibrillation listed under PMH  but no documentation therefore loop recorder placed for ongoing monitoring of potential atrial fibrillation.  BP elevated and recommended resume home medication in addition to metoprolol 25 mg twice daily.  Discontinue Zocor 40 mg daily and initiated atorvastatin 80 mg daily for HLD management.  Previously on metformin but on hold during admission due to elevated creatinine.  Other stroke risk factors include advanced age, former tobacco use, possible atrial fibrillation and migraines.  No prior history of stroke.  Recommended DAPT for 3 months then Plavix alone due to intracranial high-grade stenosis/occlusion.  Discharged home in stable condition with home health therapy with residual right hemianopia and anomia.   Residual right peripheral loss and cognitive deficits -cognition has been improving but visual loss remains -plans on following with ophthalmology once able to reschedule visit She continues to live her son who assists with 61 of ADLs and all IADLs with assistance from his wife and sister She was previously living with son but independent with ADLs and IADLs even driving Continues HH PT/OT with improvement of stability and ambulation Ambulates occasionally with cane - no recent falls Continues on aspirin and plavix without bleeding or bruising - will continue aspirin for additional 2 months Continues on atorvastatin without side effects of myalgias BP stable and was previously monitored by home health nurse but son plans on obtaining readings as nurse has recently been discontinued Loop recorder has not shown atrial fibrillation thus far No further concerns at this time     ROS:   14 system review of systems performed and negative with exception of confusion, memory loss, walking difficulty and vision  loss  PMH:  Past Medical History:  Diagnosis Date   A-fib ([place])    Anxiety state 12/22/2013   Arthritis    hands, back   Arthritis, lumbar spine 12/19/2012    Cataracts, bilateral    Chronic lower back pain 12/19/2012   Diabetes mellitus ([place])    Diverticulitis    Dysrhythmia    GERD (gastroesophageal reflux disease)    Glaucoma    Headache    hx migraines   Heart murmur    History of shingles    Hyperlipidemia    Hypertension    Numbness    rt side of rt leg   Post herpetic neuralgia 12/19/2012    PSH:  Past Surgical History:  Procedure Laterality Date   ABDOMINAL HYSTERECTOMY  1964   partial   COLON RESECTION N/A 09/02/2013   Procedure: [place] colectomy;  Surgeon: Excell Seltzer, MD;  Location: [facility];  Service: General;  Laterality: N/A;   COLONOSCOPY     COLOSTOMY TAKEDOWN N/A 11/09/2014   Procedure: TAKEDOWN [place] COLOSTOMY;  Surgeon: Excell Seltzer, MD;  Location: [facility];  Service: General;  Laterality: N/A;   INCISION AND DRAINAGE BREAST ABSCESS  1958   left   LOOP RECORDER INSERTION N/A 05/19/2018   Procedure: LOOP RECORDER INSERTION;  Surgeon: Evans Lance, MD;  Location: [place] [facility];  Service: Cardiovascular;  Laterality: N/A;   POLYPECTOMY      Social History:  Social History   Socioeconomic History   Marital status: Widowed    Spouse name: Not on file   Number of children: Not on file   Years of education: Not on file   Highest education level: Not on file  Occupational History   Not on file  Social Needs   Financial resource strain: Not on file   Food insecurity    Worry: Not on file    Inability: Not on file   Transportation needs    Medical: Not on file    Non-medical: Not on file  Tobacco Use   Smoking status: Former Smoker    Types: Cigarettes    Start date: 09/17/1991    Quit date: 10/31/1997    Years since quitting: 20.6   Smokeless tobacco: Never Used  Substance and Sexual Activity   Alcohol use: No   Drug use: No   Sexual activity: Yes  Lifestyle   Physical activity    Days per week: Not on file    Minutes per session: Not on file    Stress: Not on file  Relationships   Social connections    Talks on phone: Not on file    Gets together: Not on file    Attends religious service: Not on file    Active member of club or organization: Not on file    Attends meetings of clubs or organizations: Not on file    Relationship status: Not on file   Intimate partner violence    Fear of current or ex partner: Not on file    Emotionally abused: Not on file    Physically abused: Not on file    Forced sexual activity: Not on file  Other Topics Concern   Not on file  Social History Narrative   Not on file    Family History:  Family History  Problem Relation Age of Onset   Hypertension Mother    Hypertension Maternal Grandmother    Colon cancer Neg Hx    Stomach cancer Neg Hx  Colon polyps Neg Hx    Esophageal cancer Neg Hx    Rectal cancer Neg Hx     Medications:   Current Outpatient Medications on File Prior to Visit  Medication Sig Dispense Refill   aspirin EC 325 MG EC tablet Take 1 tablet (325 mg total) by mouth daily. 30 tablet 0   atorvastatin (LIPITOR) 80 MG tablet Take 1 tablet (80 mg total) by mouth daily at 6 PM. 30 tablet 1   blood glucose meter kit and supplies KIT Use up to four times daily as directed. E11.9 1 each 0   Blood Glucose Monitoring Suppl (ONE TOUCH ULTRA 2) w/Device KIT USE as directed 1 each 0   clopidogrel (PLAVIX) 75 MG tablet Take 1 tablet (75 mg total) by mouth daily. 30 tablet 1   diltiazem (CARDIZEM) 120 MG tablet Take 1 tablet (120 mg total) by mouth daily. For heart and blood pressure 90 tablet 3   Lancets MISC Use as directed once daily 100 each 11   latanoprost (XALATAN) 0.005 % ophthalmic solution Place 1 drop into both eyes at bedtime.     metFORMIN (GLUCOPHAGE) 500 MG tablet Take 1 tablet (500 mg total) by mouth 2 (two) times daily with a meal. For diabetes 180 tablet 3   metoprolol tartrate (LOPRESSOR) 25 MG tablet Take 1 tablet (25 mg total) by mouth 2  (two) times daily. 60 tablet 1   pantoprazole (PROTONIX) 40 MG tablet Take 1 tablet (40 mg total) by mouth daily. For reflux 90 tablet 0   timolol (BETIMOL) 0.5 % ophthalmic solution Place 1 drop into both eyes daily.     Vitamin D, Ergocalciferol, (DRISDOL) 1.25 MG (50000 UT) CAPS capsule Take 1 capsule (50,000 Units total) by mouth every 7 (seven) days. 12 capsule 0   No current facility-administered medications on file prior to visit.     Allergies:  No Known Allergies   Physical Exam  Depression screen [facility] 2/9 06/23/2018  Decreased Interest 0  Down, Depressed, Hopeless 0  PHQ - 2 Score 0  Altered sleeping -  Tired, decreased energy -  Change in appetite -  Feeling bad or failure about yourself  -  Trouble concentrating -  Moving slowly or fidgety/restless -  Suicidal thoughts -  PHQ-9 Score -     General: well developed, well nourished, pleasant elderly AA female, seated, in no evident distress Head: head normocephalic and atraumatic.    Neurologic Exam Mental Status: Awake and fully alert. Disoriented to time but oriented to place. Remote memory intact. Attention span, concentration and fund of knowledge largely appropriate but did rely on son to provide majority of history. Mood and affect appropriate.  Cranial Nerves: Extraocular movements full without nystagmus. Hearing intact to voice. Facial sensation intact.  Subjective right hemianopia.  Face, tongue, palate moves normally and symmetrically.  Shoulder shrug symmetric. Motor: No evidence of large muscle weakness per drift assessment Sensory.: intact to light touch Coordination: Rapid alternating movements normal in all extremities. Finger-to-nose and heel-to-shin performed accurately bilaterally. Gait and Station: Arises from chair with  difficulty. Stance is slightly hunched. Gait demonstrates short cautious steps without assistive device Reflexes: UTA   NIHSS  1 Modified Rankin  3      ASSESSMENT: OTTILIE WIGGLESWORTH is a 85 y.o. year old female here with large acute to early subacute left PCA territory infarct including left occipital lobe and left thalamus embolic pattern concerning for cardioembolic source on 0/73/7106.  Loop recorder placed  to assess for atrial fibrillation as atrial fibrillation listed under PMH but no documentation. Vascular risk factors include left distal PCA occlusion, ?  Atrial fibrillation, DM, HTN, HLD and migraines.  Residual deficits of cognition/memory deficit, instability and right hemianopia.    PLAN:  1. Left PCA infarct: Continue aspirin 81 mg daily and clopidogrel 75 mg daily  and lipitor 65m  for secondary stroke prevention.  Discontinue aspirin and continue Plavix alone after 08/16/2018 as 335-monthAPT completed.  Maintain strict control of hypertension with blood pressure goal 130-150, Diabetes with hemoglobin A1c goal below 6.5% and cholesterol with LDL cholesterol (bad cholesterol) goal below 70 mg/dL.  I also advised the patient to eat a healthy diet with plenty of whole grains, cereals, fruits and vegetables, exercise regularly with at least 30 minutes of continuous activity daily and maintain ideal body weight. 2. Intracranial stenosis/occlusion: Complete 3 months DAPT.  Maintain SBP 1 30-1 50. 3. ?  Atrial fibrillation: Loop recorder placed to assess for potential atrial fibrillation as listed under PMH but no prior record of atrial fibrillation around EKGs.  All EKGs during admission sinus rhythm.  We will continue to monitor loop recorder. 4. HTN: Advised to continue current treatment regimen. Advised to continue to monitor at home along with continued follow-up with PCP for management 5. HLD: Advised to continue current treatment regimen along with continued follow-up with PCP for future prescribing and monitoring of lipid panel 6. DMII: Advised to continue to monitor glucose levels at home along with continued follow-up with PCP for management and  monitoring 7. Right hemianopia: Advised to schedule follow-up visit with ophthalmology to undergo peripheral vision testing to assess for extent of visual loss 8. Cognitive deficits/instability: Ongoing participation with home therapies but advised son to contact office once home therapies completed for potential need of additional outpatient therapy    Follow up in 2 months or call earlier if needed   Greater than 50% of time during this 30 minute non-face-to-face visit was spent on counseling, explanation of diagnosis of left PCA infarct, reviewing risk factor management of HTN, HLD, DM, intracranial stenosis and questionable atrial fibrillation, planning of further management along with potential future management, and discussion with patient and family answering all questions.    JeVenancio PoissonAGNP-BC  [facility] [address]u[place] [address]Phone [phone]: This document was prepared with digital dictation and possible smart phrase technology. Any transcriptional errors that result from this process are unintentional.

## 2018-06-24 ENCOUNTER — Telehealth: Payer: Self-pay

## 2018-06-24 NOTE — Telephone Encounter (Signed)
Provider requested AVS be mailed to patient for last office visit.

## 2018-06-24 NOTE — Telephone Encounter (Signed)
I called Clinical dept at [phone] about pts lipitor. I stated that pt is no longer taking zocor because she was change to lipitor. I stated [facility] advise [place] to call this number to verified pt is no longer taking zocor and is on lipitor so the pt can get the lipitor. Janett Billow NP has spoken with the clinical department and explain pt is on lipitor now not zocor.The RN explain to the clinical rep several times pt is not on zocor but on lipitor. They stated pt does not have lipitor on file and it will need a PA. I stated pts lipitor was sent to [facility] yesterday and they are aware of whats going on and advise our office to call. I was constantly put on hold after giving all the information they needed. I even offer to fax Janett Billow NP office notes verifying pt is on lipitor.I would talk to rep and than she would discuss with her supervisor. This call was over 35 minutes with no answer if the pt can receive her lipitor.

## 2018-06-24 NOTE — Telephone Encounter (Signed)
I called [facility] and they verified that pt is on lipitor 80mg  only. The medication is not approve because she was given a 90 day rx of zocor. They stated the provider will have to call to verified she in only on lipitor not zocor.

## 2018-06-24 NOTE — Telephone Encounter (Signed)
May Grace from [facility] called again stating that the pt is needing a PA. Please call the Clinical Dept. Of her Insurance [phone]

## 2018-06-25 NOTE — Telephone Encounter (Signed)
Reviewed transmission, available ECGs appear to be MAT & WAP per Dr. Lovena Le. No changes or recommendations at this time.

## 2018-06-30 ENCOUNTER — Telehealth: Payer: Self-pay | Admitting: Student

## 2018-06-30 NOTE — Telephone Encounter (Signed)
Reviewed. Appears to be more WAP/MAT. Will see in clinic next week to adjust ILR settings.    Kimberly Castillo [address] [place], [place] 06/30/2018 2:33 PM

## 2018-06-30 NOTE — Progress Notes (Signed)
Carelink Summary Report / Loop Recorder 

## 2018-06-30 NOTE — Telephone Encounter (Signed)
Asked pt to send manual transmission to make sure not having AF, and scheduled for in office visit 7/7 to reprogram LINQ around alerts.    Legrand Como [address]" [place], PA-C 06/30/2018 11:17 AM

## 2018-07-01 DIAGNOSIS — H02412 Mechanical ptosis of left eyelid: Secondary | ICD-10-CM | POA: Diagnosis not present

## 2018-07-01 DIAGNOSIS — E119 Type 2 diabetes mellitus without complications: Secondary | ICD-10-CM | POA: Diagnosis not present

## 2018-07-01 DIAGNOSIS — H401131 Primary open-angle glaucoma, bilateral, mild stage: Secondary | ICD-10-CM | POA: Diagnosis not present

## 2018-07-01 NOTE — Telephone Encounter (Signed)
Kimberly Castillo at [facility] unable to override refill for liptor. She call the insurance company and generated a PA for lipitor.

## 2018-07-01 NOTE — Telephone Encounter (Signed)
PA done for liptor on cover my meds pending decision.

## 2018-07-01 NOTE — Telephone Encounter (Signed)
I called Kimberly Castillo at [facility] to see if pt pick up lipitor. I explain the phone call on 6/23 and how Jessica NP and Rn explain that zocor needs to be discontinue because of new rx of lipitor. She stated the medication is $90 dollars and the insurance will not allow them to refill it or override it because pt has just pick up zocor. I stated that the call was over 35 minutes and we could not get a answer.She will try to call insurance to see if she can get a answer and override it so pt can get it.She will call us back if she finds anything out.

## 2018-07-01 NOTE — Telephone Encounter (Signed)
PA done on cover my meds approve from 07/01/2018 to 01/01/2019 for lipitor 80mg .[name]

## 2018-07-01 NOTE — Telephone Encounter (Addendum)
I called [facility]ank[facility] to notified that liptor was approve. SHe ran the medication again and it was approve till 01/01/2019 for 6 dollar copay.

## 2018-07-02 DIAGNOSIS — M6281 Muscle weakness (generalized): Secondary | ICD-10-CM | POA: Diagnosis not present

## 2018-07-02 DIAGNOSIS — H53461 Homonymous bilateral field defects, right side: Secondary | ICD-10-CM | POA: Diagnosis not present

## 2018-07-02 DIAGNOSIS — I129 Hypertensive chronic kidney disease with stage 1 through stage 4 chronic kidney disease, or unspecified chronic kidney disease: Secondary | ICD-10-CM | POA: Diagnosis not present

## 2018-07-02 DIAGNOSIS — I6931 Attention and concentration deficit following cerebral infarction: Secondary | ICD-10-CM | POA: Diagnosis not present

## 2018-07-02 DIAGNOSIS — I69398 Other sequelae of cerebral infarction: Secondary | ICD-10-CM | POA: Diagnosis not present

## 2018-07-02 DIAGNOSIS — Z7902 Long term (current) use of antithrombotics/antiplatelets: Secondary | ICD-10-CM | POA: Diagnosis not present

## 2018-07-02 DIAGNOSIS — E1122 Type 2 diabetes mellitus with diabetic chronic kidney disease: Secondary | ICD-10-CM | POA: Diagnosis not present

## 2018-07-02 DIAGNOSIS — N184 Chronic kidney disease, stage 4 (severe): Secondary | ICD-10-CM | POA: Diagnosis not present

## 2018-07-02 DIAGNOSIS — Z7982 Long term (current) use of aspirin: Secondary | ICD-10-CM | POA: Diagnosis not present

## 2018-07-03 ENCOUNTER — Encounter: Payer: Self-pay | Admitting: Internal Medicine

## 2018-07-03 ENCOUNTER — Other Ambulatory Visit: Payer: Self-pay | Admitting: Internal Medicine

## 2018-07-03 ENCOUNTER — Ambulatory Visit (INDEPENDENT_AMBULATORY_CARE_PROVIDER_SITE_OTHER): Payer: Medicare HMO | Admitting: Internal Medicine

## 2018-07-03 ENCOUNTER — Other Ambulatory Visit (INDEPENDENT_AMBULATORY_CARE_PROVIDER_SITE_OTHER): Payer: Medicare HMO

## 2018-07-03 ENCOUNTER — Other Ambulatory Visit: Payer: Self-pay

## 2018-07-03 VITALS — BP 132/86 | HR 60 | Temp 98.3°F | Ht 62.0 in

## 2018-07-03 DIAGNOSIS — E1159 Type 2 diabetes mellitus with other circulatory complications: Secondary | ICD-10-CM | POA: Diagnosis not present

## 2018-07-03 DIAGNOSIS — E559 Vitamin D deficiency, unspecified: Secondary | ICD-10-CM

## 2018-07-03 DIAGNOSIS — E785 Hyperlipidemia, unspecified: Secondary | ICD-10-CM | POA: Diagnosis not present

## 2018-07-03 DIAGNOSIS — N183 Chronic kidney disease, stage 3 unspecified: Secondary | ICD-10-CM

## 2018-07-03 DIAGNOSIS — M545 Low back pain, unspecified: Secondary | ICD-10-CM

## 2018-07-03 DIAGNOSIS — I10 Essential (primary) hypertension: Secondary | ICD-10-CM

## 2018-07-03 DIAGNOSIS — G8929 Other chronic pain: Secondary | ICD-10-CM | POA: Diagnosis not present

## 2018-07-03 DIAGNOSIS — Z0000 Encounter for general adult medical examination without abnormal findings: Secondary | ICD-10-CM

## 2018-07-03 DIAGNOSIS — I4891 Unspecified atrial fibrillation: Secondary | ICD-10-CM | POA: Diagnosis not present

## 2018-07-03 LAB — LIPID PANEL
Cholesterol: 133 mg/dL (ref 0–200)
HDL: 39.3 mg/dL (ref >39.00)
LDL Cholesterol: 55 mg/dL (ref 0–99)
NonHDL: 93.95
Total CHOL/HDL Ratio: 3
Triglycerides: 193 mg/dL — ABNORMAL HIGH (ref 0.0–149.0)
VLDL: 38.6 mg/dL (ref 0.0–40.0)

## 2018-07-03 LAB — BASIC METABOLIC PANEL
BUN: 25 mg/dL — ABNORMAL HIGH (ref 6–23)
CO2: 25 mEq/L (ref 19–32)
Calcium: 9.5 mg/dL (ref 8.4–10.5)
Chloride: 108 mEq/L (ref 96–112)
Creatinine, Ser: 1.63 mg/dL — ABNORMAL HIGH (ref 0.40–1.20)
GFR: 36.42 mL/min — ABNORMAL LOW (ref >60.00)
Glucose, Bld: 96 mg/dL (ref 70–99)
Potassium: 3.3 mEq/L — ABNORMAL LOW (ref 3.5–5.1)
Sodium: 142 mEq/L (ref 135–145)

## 2018-07-03 LAB — VITAMIN D 25 HYDROXY (VIT D DEFICIENCY, FRACTURES): VITD: 22.34 ng/mL — ABNORMAL LOW (ref 30.00–100.00)

## 2018-07-03 MED ORDER — VITAMIN D (ERGOCALCIFEROL) 1.25 MG (50000 UNIT) PO CAPS: 50000.0000 [IU] | ORAL_CAPSULE | ORAL | 0 refills | Status: DC

## 2018-07-03 MED ORDER — TIZANIDINE HCL 2 MG PO CAPS: 2.0000 mg | ORAL_CAPSULE | Freq: Three times a day (TID) | ORAL | 1 refills | Status: DC | PRN

## 2018-07-03 NOTE — Assessment & Plan Note (Signed)
stable overall by history and exam, recent data reviewed with pt, and pt to continue medical treatment as before,  to f/u any worsening symptoms or concerns  

## 2018-07-03 NOTE — Patient Instructions (Signed)
Please take all new medication as prescribed - the mild muscle relaxer medication  Please continue all other medications as before, and refills have been done if requested.  Please have the pharmacy call with any other refills you may need.  Please continue your efforts at being more active, low cholesterol diet, and weight control..  Please keep your appointments with your specialists as you may have planned  Please go to the LAB in the Basement (turn left off the elevator) for the tests to be done today  You will be contacted by phone if any changes need to be made immediately.  Otherwise, you will receive a letter about your results with an explanation, but please check with MyChart first.  Please remember to sign up for MyChart if you have not done so, as this will be important to you in the future with finding out test results, communicating by private email, and scheduling acute appointments online when needed.  Please return in 6 months, or sooner if needed, with Lab testing done 3-5 days before

## 2018-07-03 NOTE — Progress Notes (Signed)
Subjective:    Patient ID: Kimberly Castillo, female    DOB: [birth year], 85 y.o.   MRN: [id number]  HPI  Here to f/u; overall doing ok with son here,  Pt denies chest pain, increasing sob or doe, wheezing, orthopnea, PND, increased LE swelling, palpitations, dizziness or syncope.  Pt denies new neurological symptoms such as new headache, or facial or extremity weakness or numbness.  Pt denies polydipsia, polyuria, or low sugar episode.  Pt states overall good compliance with meds, mostly trying to follow appropriate diet, with wt overall stable,  but little exercise however.  Pt continues to have recurring LBP without change in severity, bowel or bladder change, fever, wt loss,  worsening LE pain/numbness/weakness, gait change or falls.  To f/u neurology aug 11 with memory changes Past Medical History:  Diagnosis Date   A-fib [facility])    Anxiety state 12/22/2013   Arthritis    hands, back   Arthritis, lumbar spine 12/19/2012   Cataracts, bilateral    Chronic lower back pain 12/19/2012   Diabetes mellitus ([place])    Diverticulitis    Dysrhythmia    GERD (gastroesophageal reflux disease)    Glaucoma    Headache    hx migraines   Heart murmur    History of shingles    Hyperlipidemia    Hypertension    Numbness    rt side of rt leg   Post herpetic neuralgia 12/19/2012   Past Surgical History:  Procedure Laterality Date   ABDOMINAL HYSTERECTOMY  1964   partial   COLON RESECTION N/A 09/02/2013   Procedure: [place] colectomy;  Surgeon: Excell Seltzer, MD;  [facility];  Service: General;  Laterality: N/A;   COLONOSCOPY     COLOSTOMY TAKEDOWN N/A 11/09/2014   Procedure: TAKEDOWN [place] COLOSTOMY;  Surgeon: Excell Seltzer, MD;  [facility]: [facility];  Service: General;  Laterality: N/A;   INCISION AND DRAINAGE BREAST ABSCESS  1958   left   LOOP RECORDER INSERTION N/A 05/19/2018   Procedure: LOOP RECORDER INSERTION;  Surgeon: Evans Lance, MD;  Location: [place] [facility];  Service: Cardiovascular;  Laterality: N/A;   POLYPECTOMY      reports that she quit smoking about 20 years ago. Her smoking use included cigarettes. She started smoking about 26 years ago. She has never used smokeless tobacco. She reports that she does not drink alcohol or use drugs. family history includes Hypertension in her maternal grandmother and mother. No Known Allergies Current Outpatient Medications on File Prior to Visit  Medication Sig Dispense Refill   aspirin 81 MG EC tablet Take 1 tablet (81 mg total) by mouth daily. 60 tablet 0   atorvastatin (LIPITOR) 80 MG tablet Take 1 tablet (80 mg total) by mouth daily at 6 PM. 90 tablet 3   blood glucose meter kit and supplies KIT Use up to four times daily as directed. E11.9 1 each 0   Blood Glucose Monitoring Suppl (ONE TOUCH ULTRA 2) w/Device KIT USE as directed 1 each 0   clopidogrel (PLAVIX) 75 MG tablet Take 1 tablet (75 mg total) by mouth daily. 90 tablet 3   diltiazem (CARDIZEM) 120 MG tablet Take 1 tablet (120 mg total) by mouth daily. For heart and blood pressure 90 tablet 3   Lancets MISC Use as directed once daily 100 each 11   latanoprost (XALATAN) 0.005 % ophthalmic solution Place 1 drop into both eyes at bedtime.     metFORMIN (GLUCOPHAGE) 500 MG tablet  Take 1 tablet (500 mg total) by mouth 2 (two) times daily with a meal. For diabetes 180 tablet 3   metoprolol tartrate (LOPRESSOR) 25 MG tablet Take 1 tablet (25 mg total) by mouth 2 (two) times daily. 60 tablet 1   pantoprazole (PROTONIX) 40 MG tablet Take 1 tablet (40 mg total) by mouth daily. For reflux 90 tablet 0   timolol (BETIMOL) 0.5 % ophthalmic solution Place 1 drop into both eyes daily.     No current facility-administered medications on file prior to visit.    Review of Systems  Constitutional: Negative for other unusual diaphoresis or sweats HENT: Negative for ear discharge or swelling Eyes: Negative for other worsening visual  disturbances Respiratory: Negative for stridor or other swelling  Gastrointestinal: Negative for worsening distension or other blood Genitourinary: Negative for retention or other urinary change Musculoskeletal: Negative for other MSK pain or swelling Skin: Negative for color change or other new lesions Neurological: Negative for worsening tremors and other numbness  Psychiatric/Behavioral: Negative for worsening agitation or other fatigue All other system neg per pt    Objective:   Physical Exam BP 132/86    Pulse 60    Temp 98.3 F (36.8 C) (Oral)    Ht 5' 2"  (1.575 m)    SpO2 96%    BMI 24.69 kg/m  VS noted,  Constitutional: Pt appears in NAD HENT: Head: NCAT.  Right Ear: External ear normal.  Left Ear: External ear normal.  Eyes: . Pupils are equal, round, and reactive to light. Conjunctivae and EOM are normal Nose: without d/c or deformity Neck: Neck supple. Gross normal ROM Cardiovascular: Normal rate and regular rhythm.   Pulmonary/Chest: Effort normal and breath sounds without rales or wheezing.  Abd:  Soft, NT, ND, + BS, no organomegaly Neurological: Pt is alert. At baseline orientation, motor grossly intact Skin: Skin is warm. No rashes, other new lesions, no LE edema Psychiatric: Pt behavior is normal without agitation  No other exam findings  Lab Results  Component Value Date   WBC 5.3 05/19/2018   HGB 11.5 (L) 05/19/2018   HCT 34.4 (L) 05/19/2018   PLT 236 05/19/2018   GLUCOSE 90 05/19/2018   CHOL 211 (H) 05/15/2018   TRIG 198.0 (H) 05/15/2018   HDL 47.50 05/15/2018   LDLDIRECT 129.0 02/02/2016   LDLCALC 124 (H) 05/15/2018   ALT 18 05/16/2018   AST 25 05/16/2018   NA 140 05/19/2018   K 3.8 05/19/2018   CL 112 (H) 05/19/2018   CREATININE 1.62 (H) 05/19/2018   BUN 20 05/19/2018   CO2 20 (L) 05/19/2018   TSH 1.06 05/15/2018   INR 1.1 05/16/2018   HGBA1C 6.4 05/15/2018   MICROALBUR 360.2 (H) 05/15/2018       Assessment & Plan:

## 2018-07-03 NOTE — Assessment & Plan Note (Signed)
To add prn muscle relaxer prn,  to f/u any worsening symptoms or concerns

## 2018-07-03 NOTE — Assessment & Plan Note (Signed)
stable overall by history and exam, recent data reviewed with pt, and pt to continue medical treatment as before,  to f/u any worsening symptoms or concerns, for f/u lab today 

## 2018-07-07 ENCOUNTER — Telehealth: Payer: Self-pay | Admitting: Internal Medicine

## 2018-07-07 ENCOUNTER — Telehealth: Payer: Self-pay

## 2018-07-07 DIAGNOSIS — I480 Paroxysmal atrial fibrillation: Secondary | ICD-10-CM

## 2018-07-07 MED ORDER — APIXABAN 2.5 MG PO TABS: 2.5000 mg | ORAL_TABLET | Freq: Two times a day (BID) | ORAL | 3 refills | Status: DC

## 2018-07-07 NOTE — Telephone Encounter (Signed)
Afib confirmed.  Per Dr. Lovena Le, stop ASA and Plavix. Will start Eliquis 2.5 mg BID.  Reviewed med changes with her son.     They are also aware no need to come to device clinic tomorrow.    Legrand Como [address] [place], PA-C 07/07/2018 4:34 PM

## 2018-07-07 NOTE — Telephone Encounter (Signed)
Transmission received.

## 2018-07-07 NOTE — Telephone Encounter (Signed)
Spoke to pt son, pt is still sleeping. Son will send transmission when pt wakes up.

## 2018-07-07 NOTE — Telephone Encounter (Signed)
Pt son called state that pt is having some trouble sleeping. She just bought some Unisome OTC sleeping aid.  He would like to know if this is ok for pt to take with all the other meds she is taking.    Best number  [phone]

## 2018-07-07 NOTE — Telephone Encounter (Signed)
General/Other - Call Back  The patient's husband called to speak with Ivin Booty but she was with other patients. Please call back

## 2018-07-07 NOTE — Telephone Encounter (Signed)
-----   Message from Biagio Borg, MD sent at 07/03/2018  7:31 PM EDT ----- Letter sent, cont same tx except  The test results show that your current treatment is OK, except the Vitamin D level is low.  Please take Vitamin D 50000 units weekly for 12 weeks, then plan to change to OTC Vitamin D3 at 2000 units per day, indefinitely.Kimberly Castillo to please inform pt, I will do rx

## 2018-07-07 NOTE — Telephone Encounter (Signed)
Appointment 07-08-2018 at 2:30 pm      COVID-19 Pre-Screening Questions:  . In the past 7 to 10 days have you had a cough,  shortness of breath, headache, congestion, fever (100 or greater) body aches, chills, sore throat, or sudden loss of taste or sense of smell? . Have you been around anyone with known Covid 19. . Have you been around anyone who is awaiting Covid 19 test results in the past 7 to 10 days? . Have you been around anyone who has been exposed to Covid 19, or has mentioned symptoms of Covid 19 within the past 7 to 10 days?  If you have any concerns/questions about symptoms patients report during screening (either on the phone or at threshold). Contact the provider seeing the patient or DOD for further guidance.  If neither are available contact a member of the leadership team.           I told the pt son to disregard my call because the nurse was about to call him. I did apologized to him.

## 2018-07-07 NOTE — Telephone Encounter (Signed)
Called pt, LVM.   CRM created.  

## 2018-07-08 NOTE — Telephone Encounter (Signed)
That should be fine, or ok to try melatonin up to 5 mg at bedtime if this does not work well

## 2018-07-08 NOTE — Telephone Encounter (Signed)
Pt's son, Jeneen Rinks, has been informed.

## 2018-07-09 DIAGNOSIS — I6931 Attention and concentration deficit following cerebral infarction: Secondary | ICD-10-CM | POA: Diagnosis not present

## 2018-07-09 DIAGNOSIS — Z7902 Long term (current) use of antithrombotics/antiplatelets: Secondary | ICD-10-CM | POA: Diagnosis not present

## 2018-07-09 DIAGNOSIS — I69398 Other sequelae of cerebral infarction: Secondary | ICD-10-CM | POA: Diagnosis not present

## 2018-07-09 DIAGNOSIS — Z7982 Long term (current) use of aspirin: Secondary | ICD-10-CM | POA: Diagnosis not present

## 2018-07-09 DIAGNOSIS — N184 Chronic kidney disease, stage 4 (severe): Secondary | ICD-10-CM | POA: Diagnosis not present

## 2018-07-09 DIAGNOSIS — M6281 Muscle weakness (generalized): Secondary | ICD-10-CM | POA: Diagnosis not present

## 2018-07-09 DIAGNOSIS — E1122 Type 2 diabetes mellitus with diabetic chronic kidney disease: Secondary | ICD-10-CM | POA: Diagnosis not present

## 2018-07-09 DIAGNOSIS — H53461 Homonymous bilateral field defects, right side: Secondary | ICD-10-CM | POA: Diagnosis not present

## 2018-07-09 DIAGNOSIS — I129 Hypertensive chronic kidney disease with stage 1 through stage 4 chronic kidney disease, or unspecified chronic kidney disease: Secondary | ICD-10-CM | POA: Diagnosis not present

## 2018-07-16 ENCOUNTER — Ambulatory Visit: Payer: Medicare HMO | Admitting: Internal Medicine

## 2018-07-17 DIAGNOSIS — Z7902 Long term (current) use of antithrombotics/antiplatelets: Secondary | ICD-10-CM | POA: Diagnosis not present

## 2018-07-17 DIAGNOSIS — I69398 Other sequelae of cerebral infarction: Secondary | ICD-10-CM | POA: Diagnosis not present

## 2018-07-17 DIAGNOSIS — M6281 Muscle weakness (generalized): Secondary | ICD-10-CM | POA: Diagnosis not present

## 2018-07-17 DIAGNOSIS — I129 Hypertensive chronic kidney disease with stage 1 through stage 4 chronic kidney disease, or unspecified chronic kidney disease: Secondary | ICD-10-CM | POA: Diagnosis not present

## 2018-07-17 DIAGNOSIS — E1122 Type 2 diabetes mellitus with diabetic chronic kidney disease: Secondary | ICD-10-CM | POA: Diagnosis not present

## 2018-07-17 DIAGNOSIS — I6931 Attention and concentration deficit following cerebral infarction: Secondary | ICD-10-CM | POA: Diagnosis not present

## 2018-07-17 DIAGNOSIS — H53461 Homonymous bilateral field defects, right side: Secondary | ICD-10-CM | POA: Diagnosis not present

## 2018-07-17 DIAGNOSIS — Z7982 Long term (current) use of aspirin: Secondary | ICD-10-CM | POA: Diagnosis not present

## 2018-07-17 DIAGNOSIS — N184 Chronic kidney disease, stage 4 (severe): Secondary | ICD-10-CM | POA: Diagnosis not present

## 2018-07-21 ENCOUNTER — Ambulatory Visit: Payer: Self-pay | Admitting: *Deleted

## 2018-07-21 NOTE — Telephone Encounter (Signed)
Pt son's call with having his mom having indigestion after eating a food that did not agree with her. She has not tried anything yet for indigestion. Advised that she drink plenty of water, and to drink something that would make her burp. She should sit up at least 2 hours after eating and to watch the type of foods that she is eating. Also Advised him to check with her pharmacist regarding medications that she could take over the counter that do not interfere with her prescribed medications. He voiced understanding and will call back if the indigestion or reflux does not improve Routing to [facility] at [facility] for review and recommendation.

## 2018-07-22 NOTE — Telephone Encounter (Signed)
Noted  

## 2018-07-23 ENCOUNTER — Telehealth: Payer: Self-pay | Admitting: Internal Medicine

## 2018-07-23 MED ORDER — TIZANIDINE HCL 4 MG PO TABS: 4.0000 mg | ORAL_TABLET | Freq: Four times a day (QID) | ORAL | 1 refills | Status: DC | PRN

## 2018-07-23 NOTE — Telephone Encounter (Signed)
Ok for zanaflex 4 mg; be sure to watch for any sedation

## 2018-07-23 NOTE — Addendum Note (Signed)
Addended by: Biagio Borg on: 07/23/2018 11:52 AM   Modules accepted: Orders

## 2018-07-23 NOTE — Telephone Encounter (Signed)
Pt should already be taking protonix.  OK to add OTC pepcid 20 mg in the evening.

## 2018-07-23 NOTE — Telephone Encounter (Signed)
Pt's son has been informed.   He stated that the pt is having some issues with acid reflux and would like to know what you suggest.

## 2018-07-23 NOTE — Telephone Encounter (Signed)
Patient's son, Jeneen Rinks, calling on behalf of patient stating that the medication tizanidine (ZANAFLEX) 2 MG capsule does not seem to be working well at current dose. He inquired if dosage could be upped or if an alternative could be called into pharmacy. Please advise.

## 2018-07-24 ENCOUNTER — Ambulatory Visit (INDEPENDENT_AMBULATORY_CARE_PROVIDER_SITE_OTHER): Payer: Medicare HMO | Admitting: *Deleted

## 2018-07-24 DIAGNOSIS — N184 Chronic kidney disease, stage 4 (severe): Secondary | ICD-10-CM | POA: Diagnosis not present

## 2018-07-24 DIAGNOSIS — H53461 Homonymous bilateral field defects, right side: Secondary | ICD-10-CM | POA: Diagnosis not present

## 2018-07-24 DIAGNOSIS — I482 Chronic atrial fibrillation, unspecified: Secondary | ICD-10-CM

## 2018-07-24 DIAGNOSIS — I63432 Cerebral infarction due to embolism of left posterior cerebral artery: Secondary | ICD-10-CM | POA: Diagnosis not present

## 2018-07-24 DIAGNOSIS — Z7982 Long term (current) use of aspirin: Secondary | ICD-10-CM | POA: Diagnosis not present

## 2018-07-24 DIAGNOSIS — M6281 Muscle weakness (generalized): Secondary | ICD-10-CM | POA: Diagnosis not present

## 2018-07-24 DIAGNOSIS — E1122 Type 2 diabetes mellitus with diabetic chronic kidney disease: Secondary | ICD-10-CM | POA: Diagnosis not present

## 2018-07-24 DIAGNOSIS — I69398 Other sequelae of cerebral infarction: Secondary | ICD-10-CM | POA: Diagnosis not present

## 2018-07-24 DIAGNOSIS — I6931 Attention and concentration deficit following cerebral infarction: Secondary | ICD-10-CM | POA: Diagnosis not present

## 2018-07-24 DIAGNOSIS — I129 Hypertensive chronic kidney disease with stage 1 through stage 4 chronic kidney disease, or unspecified chronic kidney disease: Secondary | ICD-10-CM | POA: Diagnosis not present

## 2018-07-24 DIAGNOSIS — Z7902 Long term (current) use of antithrombotics/antiplatelets: Secondary | ICD-10-CM | POA: Diagnosis not present

## 2018-07-24 LAB — CUP PACEART REMOTE DEVICE CHECK
Date Time Interrogation Session: 20200723172715
Implantable Pulse Generator Implant Date: 20200518

## 2018-07-24 NOTE — Telephone Encounter (Signed)
Pt's son has been informed and expressed understanding.

## 2018-07-25 ENCOUNTER — Telehealth: Payer: Self-pay | Admitting: Internal Medicine

## 2018-07-25 DIAGNOSIS — N184 Chronic kidney disease, stage 4 (severe): Secondary | ICD-10-CM | POA: Diagnosis not present

## 2018-07-25 DIAGNOSIS — E1122 Type 2 diabetes mellitus with diabetic chronic kidney disease: Secondary | ICD-10-CM | POA: Diagnosis not present

## 2018-07-25 DIAGNOSIS — I6931 Attention and concentration deficit following cerebral infarction: Secondary | ICD-10-CM | POA: Diagnosis not present

## 2018-07-25 DIAGNOSIS — Z7902 Long term (current) use of antithrombotics/antiplatelets: Secondary | ICD-10-CM | POA: Diagnosis not present

## 2018-07-25 DIAGNOSIS — I69398 Other sequelae of cerebral infarction: Secondary | ICD-10-CM | POA: Diagnosis not present

## 2018-07-25 DIAGNOSIS — I129 Hypertensive chronic kidney disease with stage 1 through stage 4 chronic kidney disease, or unspecified chronic kidney disease: Secondary | ICD-10-CM | POA: Diagnosis not present

## 2018-07-25 DIAGNOSIS — H53461 Homonymous bilateral field defects, right side: Secondary | ICD-10-CM | POA: Diagnosis not present

## 2018-07-25 DIAGNOSIS — M6281 Muscle weakness (generalized): Secondary | ICD-10-CM | POA: Diagnosis not present

## 2018-07-25 DIAGNOSIS — Z7982 Long term (current) use of aspirin: Secondary | ICD-10-CM | POA: Diagnosis not present

## 2018-07-25 NOTE — Telephone Encounter (Addendum)
Beth with [facility] PT, states she had a visit with pt today and her bp was  180/80 & 190/90.+beth left instructions with the family for pt to rest. Monitor her bp for the rest of the day. Also if it got any higher, to go ED.  They are not sure pt took her bp today. This seems to be a one time issue.  All other times at visits bp has been within normal range.  Pt felt "heavy headed" but no other symptoms . Pt did not sleep well last. Would like someone to follow up and give family a call to take pt bp again.  She is NOT with the pt. cb  [phone]

## 2018-07-30 ENCOUNTER — Ambulatory Visit: Payer: Self-pay | Admitting: *Deleted

## 2018-07-30 DIAGNOSIS — I6931 Attention and concentration deficit following cerebral infarction: Secondary | ICD-10-CM | POA: Diagnosis not present

## 2018-07-30 DIAGNOSIS — E1122 Type 2 diabetes mellitus with diabetic chronic kidney disease: Secondary | ICD-10-CM | POA: Diagnosis not present

## 2018-07-30 DIAGNOSIS — M6281 Muscle weakness (generalized): Secondary | ICD-10-CM | POA: Diagnosis not present

## 2018-07-30 DIAGNOSIS — I69398 Other sequelae of cerebral infarction: Secondary | ICD-10-CM | POA: Diagnosis not present

## 2018-07-30 DIAGNOSIS — I129 Hypertensive chronic kidney disease with stage 1 through stage 4 chronic kidney disease, or unspecified chronic kidney disease: Secondary | ICD-10-CM | POA: Diagnosis not present

## 2018-07-30 DIAGNOSIS — Z7982 Long term (current) use of aspirin: Secondary | ICD-10-CM | POA: Diagnosis not present

## 2018-07-30 DIAGNOSIS — Z7902 Long term (current) use of antithrombotics/antiplatelets: Secondary | ICD-10-CM | POA: Diagnosis not present

## 2018-07-30 DIAGNOSIS — H53461 Homonymous bilateral field defects, right side: Secondary | ICD-10-CM | POA: Diagnosis not present

## 2018-07-30 DIAGNOSIS — N184 Chronic kidney disease, stage 4 (severe): Secondary | ICD-10-CM | POA: Diagnosis not present

## 2018-07-30 NOTE — Telephone Encounter (Signed)
The physical therapist, Jorene Minors, called stating that the pt's  b/p is elevated and has had some chest pain this morning and having shortness of breath when she was in the bath room getting ready to see the PT. She sat in her chair until it went away. The chest pain went away and also the shortness of breath. She denies nausea, blurred vision or weakness.  Her b/p this morning but the treatment with PT was 190/82, and other v/s are Temp 96 Axillary, HR 66, O2 sat 96 and fasting blood sugar 98.. And now 198/100. She had not taken her b/p meds because the pills sometimes get stuck in her throat.  She is denying any symptoms now. Advised that she should be checked out in the ED. Her daughter wanted to give her her medications and then recheck her b/p. Voiced concerns to her daughter for the chest pain with an elevated b/p.  She will call back with the readings and take her to the ED if it is still elevated.  Previous b/p readings have been 112 to 132 over 60-80. Routing to [facility] at [facility] for review.    Reason for Disposition . [8] Systolic BP  >= 177 OR Diastolic >= 116 AND [5] cardiac or neurologic symptoms (e.g., chest pain, difficulty breathing, unsteady gait, blurred vision)  Answer Assessment - Initial Assessment Questions 1. BLOOD PRESSURE: "What is the blood pressure?" "Did you take at least two measurements 5 minutes apart?"     190/82 and 198/100 2. ONSET: "When did you take your blood pressure?"     This morning 3. HOW: "How did you obtain the blood pressure?" (e.g., visiting nurse, automatic home BP monitor)     physical therapist  4. HISTORY: "Do you have a history of high blood pressure?"     yes 5. MEDICATIONS: "Are you taking any medications for blood pressure?" "Have you missed any doses recently?"     Have not taken meds this morning 6. OTHER SYMPTOMS: "Do you have any symptoms?" (e.g., headache, chest pain, blurred vision, difficulty breathing, weakness)     Had chest pain and  shortness of breath only 7. PREGNANCY: "Is there any chance you are pregnant?" "When was your last menstrual period?"     n/a  Protocols used: HIGH BLOOD PRESSURE-A-AH

## 2018-07-30 NOTE — Telephone Encounter (Signed)
noted 

## 2018-08-01 DIAGNOSIS — I129 Hypertensive chronic kidney disease with stage 1 through stage 4 chronic kidney disease, or unspecified chronic kidney disease: Secondary | ICD-10-CM | POA: Diagnosis not present

## 2018-08-01 DIAGNOSIS — N184 Chronic kidney disease, stage 4 (severe): Secondary | ICD-10-CM | POA: Diagnosis not present

## 2018-08-01 DIAGNOSIS — Z7902 Long term (current) use of antithrombotics/antiplatelets: Secondary | ICD-10-CM | POA: Diagnosis not present

## 2018-08-01 DIAGNOSIS — Z7982 Long term (current) use of aspirin: Secondary | ICD-10-CM | POA: Diagnosis not present

## 2018-08-01 DIAGNOSIS — M6281 Muscle weakness (generalized): Secondary | ICD-10-CM | POA: Diagnosis not present

## 2018-08-01 DIAGNOSIS — I69398 Other sequelae of cerebral infarction: Secondary | ICD-10-CM | POA: Diagnosis not present

## 2018-08-01 DIAGNOSIS — I6931 Attention and concentration deficit following cerebral infarction: Secondary | ICD-10-CM | POA: Diagnosis not present

## 2018-08-01 DIAGNOSIS — H53461 Homonymous bilateral field defects, right side: Secondary | ICD-10-CM | POA: Diagnosis not present

## 2018-08-01 DIAGNOSIS — E1122 Type 2 diabetes mellitus with diabetic chronic kidney disease: Secondary | ICD-10-CM | POA: Diagnosis not present

## 2018-08-04 NOTE — Progress Notes (Signed)
Carelink Summary Report / Loop Recorder 

## 2018-08-06 DIAGNOSIS — Z7982 Long term (current) use of aspirin: Secondary | ICD-10-CM | POA: Diagnosis not present

## 2018-08-06 DIAGNOSIS — H53461 Homonymous bilateral field defects, right side: Secondary | ICD-10-CM | POA: Diagnosis not present

## 2018-08-06 DIAGNOSIS — I6931 Attention and concentration deficit following cerebral infarction: Secondary | ICD-10-CM | POA: Diagnosis not present

## 2018-08-06 DIAGNOSIS — N184 Chronic kidney disease, stage 4 (severe): Secondary | ICD-10-CM | POA: Diagnosis not present

## 2018-08-06 DIAGNOSIS — E1122 Type 2 diabetes mellitus with diabetic chronic kidney disease: Secondary | ICD-10-CM | POA: Diagnosis not present

## 2018-08-06 DIAGNOSIS — I69398 Other sequelae of cerebral infarction: Secondary | ICD-10-CM | POA: Diagnosis not present

## 2018-08-06 DIAGNOSIS — Z7902 Long term (current) use of antithrombotics/antiplatelets: Secondary | ICD-10-CM | POA: Diagnosis not present

## 2018-08-06 DIAGNOSIS — M6281 Muscle weakness (generalized): Secondary | ICD-10-CM | POA: Diagnosis not present

## 2018-08-06 DIAGNOSIS — I129 Hypertensive chronic kidney disease with stage 1 through stage 4 chronic kidney disease, or unspecified chronic kidney disease: Secondary | ICD-10-CM | POA: Diagnosis not present

## 2018-08-12 ENCOUNTER — Ambulatory Visit (INDEPENDENT_AMBULATORY_CARE_PROVIDER_SITE_OTHER): Payer: Medicare HMO | Admitting: Adult Health

## 2018-08-12 ENCOUNTER — Encounter: Payer: Self-pay | Admitting: Adult Health

## 2018-08-12 ENCOUNTER — Other Ambulatory Visit: Payer: Self-pay

## 2018-08-12 VITALS — BP 144/86 | HR 64 | Temp 98.5°F | Ht 62.0 in | Wt 144.6 lb

## 2018-08-12 DIAGNOSIS — I10 Essential (primary) hypertension: Secondary | ICD-10-CM | POA: Diagnosis not present

## 2018-08-12 DIAGNOSIS — I480 Paroxysmal atrial fibrillation: Secondary | ICD-10-CM

## 2018-08-12 DIAGNOSIS — I63532 Cerebral infarction due to unspecified occlusion or stenosis of left posterior cerebral artery: Secondary | ICD-10-CM | POA: Diagnosis not present

## 2018-08-12 DIAGNOSIS — E785 Hyperlipidemia, unspecified: Secondary | ICD-10-CM

## 2018-08-12 DIAGNOSIS — E1169 Type 2 diabetes mellitus with other specified complication: Secondary | ICD-10-CM | POA: Diagnosis not present

## 2018-08-12 DIAGNOSIS — H53461 Homonymous bilateral field defects, right side: Secondary | ICD-10-CM

## 2018-08-12 DIAGNOSIS — I69319 Unspecified symptoms and signs involving cognitive functions following cerebral infarction: Secondary | ICD-10-CM | POA: Diagnosis not present

## 2018-08-12 NOTE — Progress Notes (Signed)
[facility] [address] [place]. [place] [address] [phone]       VIRTUAL VISIT FOLLOW UP NOTE  [name] Date of Birth:  08/22/1933 Medical Record Number:  [id number]   Reason for Referral:  hospital stroke follow up    CHIEF COMPLAINT:  Chief Complaint  Patient presents with  . Follow-up    Room 9, with her son. [facility] f[facility]u. "No complaints, cant say they remember"    HPI: Stroke admission 05/16/2018: Kimberly Castillo is a 85 y.o. female with history of atrial fibrillation not on anticoagulation, diabetes mellitus, hypertension, migraines and hyperlipidemia  who presented with AMS and left sided headache. She did not receive IV t-PA due to late presentation (>4.5 hours from time of onset).  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  MRA showed left distal PCA occlusion and left ICA 40 to 59% stenosis.  2D echo unremarkable.  LDL 124 and A1c 6.4.  Atrial fibrillation listed under PMH but no documentation therefore loop recorder placed for ongoing monitoring of potential atrial fibrillation.  BP elevated and recommended resume home medication in addition to metoprolol 25 mg twice daily.  Discontinue Zocor 40 mg daily and initiated atorvastatin 80 mg daily for HLD management.  Previously on metformin but on hold during admission due to elevated creatinine.  Other stroke risk factors include advanced age, former tobacco use, possible atrial fibrillation and migraines.  No prior history of stroke.  Recommended DAPT for 3 months then Plavix alone due to intracranial high-grade stenosis/occlusion.  Discharged home in stable condition with home health therapy with residual right hemianopia and anomia.   06/23/2018 virtual visit: Residual right peripheral loss and cognitive deficits -cognition has been improving but visual loss  remains -plans on following with ophthalmology once able to reschedule visit She continues to live her son who assists with 42 of ADLs and all IADLs with assistance from his wife and sister She was previously living with son but independent with ADLs and IADLs even driving Continues [facility] PT/OT with improvement of stability and ambulation Ambulates occasionally with cane - no recent falls Continues on aspirin and plavix without bleeding or bruising - will continue aspirin for additional 2 months Continues on atorvastatin without side effects of myalgias BP stable and was previously monitored by home health nurse but son plans on obtaining readings as nurse has recently been discontinued Loop recorder has not shown atrial fibrillation thus far No further concerns at this time  08/12/2018 update: Kimberly Castillo is a 85 year old female who is being seen today for stroke follow-up and is accompanied by her son.  She continues to have residual right homonymous hemianopia and cognitive difficulties.  She denies any change in residual deficits.  She has since completed all OT and ST but continues to receive home PT.  Continues to reside with her son with ongoing need of assistance by her daughter for ADLs and all IADLs.  She was evaluated by ophthalmology last month in person, he requested evaluation to be faxed to our office unfortunately, information was not received.  She does complain of 2 to 76-monthonset of lower back pain which has been limiting mobility.  PCP initiated tizanidine and son is questioning possible need of increase.  She is currently ambulating without assistive device and denies any recent falls.  Pain is worse after sitting for prolonged period of  time and improves after ambulation.  She denies trauma or prior history of back pain.  Loop recorder did show evidence of atrial fibrillation and therefore started on Eliquis.  She remains on Eliquis without bleeding or bruising.  She continues on  atorvastatin without side effects of myalgias.  Blood pressure today 144/86.  No further concerns at this time.    ROS:   14 system review of systems performed and negative with exception of appetite change, chills, eye itching, loss of vision, cold intolerance, back pain, walking difficulty and memory loss  PMH:  Past Medical History:  Diagnosis Date  . A-fib ([place])   . Anxiety state 12/22/2013  . Arthritis    hands, back  . Arthritis, lumbar spine 12/19/2012  . Cataracts, bilateral   . Chronic lower back pain 12/19/2012  . Diabetes mellitus ([place])   . Diverticulitis   . Dysrhythmia   . GERD (gastroesophageal reflux disease)   . Glaucoma   . Headache    hx migraines  . Heart murmur   . History of shingles   . Hyperlipidemia   . Hypertension   . Numbness    rt side of rt leg  . Post herpetic neuralgia 12/19/2012    PSH:  Past Surgical History:  Procedure Laterality Date  . ABDOMINAL HYSTERECTOMY  1964   partial  . COLON RESECTION N/A 09/02/2013   Procedure: [place] colectomy;  Surgeon: Excell Seltzer, MD;  Location: [facility];  Service: General;  Laterality: N/A;  . COLONOSCOPY    . COLOSTOMY TAKEDOWN N/A 11/09/2014   Procedure: TAKEDOWN [place] COLOSTOMY;  Surgeon: Excell Seltzer, MD;  Location: [facility];  Service: General;  Laterality: N/A;  . [place]   left  . LOOP RECORDER INSERTION N/A 05/19/2018   Procedure: LOOP RECORDER INSERTION;  Surgeon: Evans Lance, MD;  Location: [facility];  Service: Cardiovascular;  Laterality: N/A;  . POLYPECTOMY      Social History:  Social History   Socioeconomic History  . Marital status: Widowed    Spouse name: Not on file  . Number of children: Not on file  . Years of education: Not on file  . Highest education level: Not on file  Occupational History  . Not on file  Social Needs  . Financial resource strain: Not on file  . Food insecurity    Worry: Not on file     Inability: Not on file  . Transportation needs    Medical: Not on file    Non-medical: Not on file  Tobacco Use  . Smoking status: Former Smoker    Types: Cigarettes    Start date: 09/17/1991    Quit date: 10/31/1997    Years since quitting: 20.7  . Smokeless tobacco: Never Used  Substance and Sexual Activity  . Alcohol use: No  . Drug use: No  . Sexual activity: Yes  Lifestyle  . Physical activity    Days per week: Not on file    Minutes per session: Not on file  . Stress: Not on file  Relationships  . Social Herbalist on phone: Not on file    Gets together: Not on file    Attends religious service: Not on file    Active member of club or organization: Not on file    Attends meetings of clubs or organizations: Not on file    Relationship status: Not on file  . Intimate partner violence  Fear of current or ex partner: Not on file    Emotionally abused: Not on file    Physically abused: Not on file    Forced sexual activity: Not on file  Other Topics Concern  . Not on file  Social History Narrative  . Not on file    Family History:  Family History  Problem Relation Age of Onset  . Hypertension Mother   . Hypertension Maternal Grandmother   . Colon cancer Neg Hx   . Stomach cancer Neg Hx   . Colon polyps Neg Hx   . Esophageal cancer Neg Hx   . Rectal cancer Neg Hx     Medications:   Current Outpatient Medications on File Prior to Visit  Medication Sig Dispense Refill  . apixaban (ELIQUIS) 2.5 MG TABS tablet Take 1 tablet (2.5 mg total) by mouth 2 (two) times daily. 60 tablet 3  . atorvastatin (LIPITOR) 80 MG tablet Take 1 tablet (80 mg total) by mouth daily at 6 PM. 90 tablet 3  . blood glucose meter kit and supplies KIT Use up to four times daily as directed. E11.9 1 each 0  . Blood Glucose Monitoring Suppl (ONE TOUCH ULTRA 2) w/Device KIT USE as directed 1 each 0  . diltiazem (CARDIZEM) 120 MG tablet Take 1 tablet (120 mg total) by mouth daily.  For heart and blood pressure 90 tablet 3  . Lancets MISC Use as directed once daily 100 each 11  . latanoprost (XALATAN) 0.005 % ophthalmic solution Place 1 drop into both eyes at bedtime.    . metFORMIN (GLUCOPHAGE) 500 MG tablet Take 1 tablet (500 mg total) by mouth 2 (two) times daily with a meal. For diabetes 180 tablet 3  . metoprolol tartrate (LOPRESSOR) 25 MG tablet Take 1 tablet (25 mg total) by mouth 2 (two) times daily. 60 tablet 1  . pantoprazole (PROTONIX) 40 MG tablet Take 1 tablet (40 mg total) by mouth daily. For reflux 90 tablet 0  . timolol (BETIMOL) 0.5 % ophthalmic solution Place 1 drop into both eyes daily.    [name] tiZANidine (ZANAFLEX) 4 MG tablet Take 1 tablet (4 mg total) by mouth every 6 (six) hours as needed for muscle spasms. 40 tablet 1  . Vitamin D, Ergocalciferol, (DRISDOL) 1.25 MG (50000 UT) CAPS capsule Take 1 capsule (50,000 Units total) by mouth every 7 (seven) days. 12 capsule 0   No current facility-administered medications on file prior to visit.     Allergies:  No Known Allergies   Physical Exam  Today's Vitals   08/12/18 1124  BP: (!) 144/86  Pulse: 64  Temp: 98.5 F (36.9 C)  Weight: 144 lb 9.6 oz (65.6 kg)  Height: _0  (1.575 m)   Body mass index is 26.45 kg/m.  General: well developed, well nourished,  pleasant elderly African-American female, seated, in no evident distress Head: head normocephalic and atraumatic.   Neck: supple with no carotid or supraclavicular bruits Cardiovascular: irregular rate and rhythm, no murmurs Musculoskeletal: no deformity Skin:  no rash/petichiae Vascular:  Normal pulses all extremities   Neurologic Exam Mental Status: Awake and fully alert. Oriented to self and place. Disoriented to month and year.  Recent and remote memory diminished. Attention span, concentration and fund of knowledge diminished. Mood and affect appropriate.  Recall 0/3.  Difficulty with serial additions.  Animal naming 4 in 60 seconds.  Cranial Nerves: Fundoscopic exam reveals sharp disc margins. Pupils equal, briskly reactive to light. Extraocular movements full  without nystagmus. Visual fields show right homonymous hemianopia. Hearing intact. Facial sensation intact. Face, tongue, palate moves normally and symmetrically.  Motor: Normal bulk and tone. Normal strength in all tested extremity muscles except mild bilateral hip flexor weakness Sensory.: intact to touch , pinprick , position and vibratory sensation.  Coordination: Rapid alternating movements normal in all extremities. Finger-to-nose and heel-to-shin performed accurately bilaterally. Gait and Station: Arises from chair with mild difficulty.  Due to back pain after sitting in exam room, difficulty with mobility and requested wheelchair for transportation to leave office.  Gait assessment deferred. Reflexes: 1+ and symmetric. Toes downgoing.      ASSESSMENT: Kimberly Castillo is a 85 y.o. year old female here with large acute to early subacute left PCA territory infarct including left occipital lobe and left thalamus embolic pattern concerning for cardioembolic source on 05/04/5463.  Loop recorder placed to assess for atrial fibrillation as atrial fibrillation listed under PMH but no documentation. Vascular risk factors include left distal PCA occlusion, Atrial fibrillation, DM, HTN, HLD and migraines.  Loop recorder did show evidence of atrial fibrillation therefore initiated Eliquis.  Residual deficits of cognition/memory loss and right homonymous hemianopia.    PLAN:  1. Left PCA infarct: Continue Eliquis (apixaban) daily  and lipitor 63m  for secondary stroke prevention. Maintain strict control of hypertension with blood pressure goal 130-150, Diabetes with hemoglobin A1c goal below 6.5% and cholesterol with LDL cholesterol (bad cholesterol) goal below 70 mg/dL.  I also advised the patient to eat a healthy diet with plenty of whole grains, cereals, fruits and vegetables,  exercise regularly with at least 30 minutes of continuous activity daily and maintain ideal body weight. 2. Residual deficits: Referral placed to outpatient speech and occupational therapy for ongoing residual deficits.  If she continues to have cognitive difficulties, will perform MMSE at follow up visit. continue to participate in home PT but will likely need outpatient PT once completed 3. Lower back pain: Advised to continue to follow with PCP for ongoing management.  Advised son to discuss possible increase of tizanidine with PCP along with ensuring physical therapy is providing exercises for back pain 4. Atrial fibrillation: found via Loop recorder and recommend continuation of Eliquis and ongoing follow-up with cardiology 5. HTN: Advised to continue current treatment regimen. Advised to continue to monitor at home along with continued follow-up with PCP for management 6. HLD: Advised to continue current treatment regimen along with continued follow-up with PCP for future prescribing and monitoring of lipid panel 7. DMII: Advised to continue to monitor glucose levels at home along with continued follow-up with PCP for management and monitoring   Follow up in 2 months or call earlier if needed   Greater than 50% of time during this 30 minute non-face-to-face visit was spent on counseling, explanation of diagnosis of left PCA infarct, reviewing risk factor management of HTN, HLD, DM, intracranial stenosis and questionable atrial fibrillation, planning of further management along with potential future management, and discussion with patient and family answering all questions.    JVenancio Poisson AGNP-BC  [facility] [address] [address] Phone [phone] [phone]: This document was prepared with digital dictation and possible smart phrase technology. Any transcriptional errors that result from this process are  unintentional.

## 2018-08-12 NOTE — Patient Instructions (Signed)
Continue Eliquis (apixaban) daily  and lipitor  for secondary stroke prevention  Continue to follow up with PCP regarding cholesterol, blood pressure and diabetes management   Referral placed for outpatient speech and occupational therapy - may add physical therapy once home therapy completed  Continue to follow up with your PCP regarding back pain  Continue to monitor blood pressure at home  Maintain strict control of hypertension with blood pressure goal below 130/90, diabetes with hemoglobin A1c goal below 6.5% and cholesterol with LDL cholesterol (bad cholesterol) goal below 70 mg/dL. I also advised the patient to eat a healthy diet with plenty of whole grains, cereals, fruits and vegetables, exercise regularly and maintain ideal body weight.  Followup in the future with me in 2 months or call earlier if needed       Thank you for coming to see [place] at [facility]. I hope we have been able to provide you high quality care today.  You may receive a patient satisfaction survey over the next few weeks. We would appreciate your feedback and comments so that we may continue to improve ourselves and the health of our patients.

## 2018-08-15 DIAGNOSIS — I69398 Other sequelae of cerebral infarction: Secondary | ICD-10-CM | POA: Diagnosis not present

## 2018-08-15 DIAGNOSIS — Z7982 Long term (current) use of aspirin: Secondary | ICD-10-CM | POA: Diagnosis not present

## 2018-08-15 DIAGNOSIS — H53461 Homonymous bilateral field defects, right side: Secondary | ICD-10-CM | POA: Diagnosis not present

## 2018-08-15 DIAGNOSIS — I129 Hypertensive chronic kidney disease with stage 1 through stage 4 chronic kidney disease, or unspecified chronic kidney disease: Secondary | ICD-10-CM | POA: Diagnosis not present

## 2018-08-15 DIAGNOSIS — Z7902 Long term (current) use of antithrombotics/antiplatelets: Secondary | ICD-10-CM | POA: Diagnosis not present

## 2018-08-15 DIAGNOSIS — E1122 Type 2 diabetes mellitus with diabetic chronic kidney disease: Secondary | ICD-10-CM | POA: Diagnosis not present

## 2018-08-15 DIAGNOSIS — M6281 Muscle weakness (generalized): Secondary | ICD-10-CM | POA: Diagnosis not present

## 2018-08-15 DIAGNOSIS — N184 Chronic kidney disease, stage 4 (severe): Secondary | ICD-10-CM | POA: Diagnosis not present

## 2018-08-15 DIAGNOSIS — I6931 Attention and concentration deficit following cerebral infarction: Secondary | ICD-10-CM | POA: Diagnosis not present

## 2018-08-16 NOTE — Progress Notes (Signed)
I agree with the above plan 

## 2018-08-18 ENCOUNTER — Telehealth: Payer: Self-pay | Admitting: *Deleted

## 2018-08-18 NOTE — Telephone Encounter (Signed)
Received eye exam from [facility] [place], [place].  In box.

## 2018-08-21 DIAGNOSIS — I129 Hypertensive chronic kidney disease with stage 1 through stage 4 chronic kidney disease, or unspecified chronic kidney disease: Secondary | ICD-10-CM | POA: Diagnosis not present

## 2018-08-21 DIAGNOSIS — H53461 Homonymous bilateral field defects, right side: Secondary | ICD-10-CM | POA: Diagnosis not present

## 2018-08-21 DIAGNOSIS — I6931 Attention and concentration deficit following cerebral infarction: Secondary | ICD-10-CM | POA: Diagnosis not present

## 2018-08-21 DIAGNOSIS — Z7902 Long term (current) use of antithrombotics/antiplatelets: Secondary | ICD-10-CM | POA: Diagnosis not present

## 2018-08-21 DIAGNOSIS — Z7982 Long term (current) use of aspirin: Secondary | ICD-10-CM | POA: Diagnosis not present

## 2018-08-21 DIAGNOSIS — I69398 Other sequelae of cerebral infarction: Secondary | ICD-10-CM | POA: Diagnosis not present

## 2018-08-21 DIAGNOSIS — M6281 Muscle weakness (generalized): Secondary | ICD-10-CM | POA: Diagnosis not present

## 2018-08-21 DIAGNOSIS — N184 Chronic kidney disease, stage 4 (severe): Secondary | ICD-10-CM | POA: Diagnosis not present

## 2018-08-21 DIAGNOSIS — E1122 Type 2 diabetes mellitus with diabetic chronic kidney disease: Secondary | ICD-10-CM | POA: Diagnosis not present

## 2018-08-26 ENCOUNTER — Ambulatory Visit (INDEPENDENT_AMBULATORY_CARE_PROVIDER_SITE_OTHER): Payer: Medicare HMO | Admitting: *Deleted

## 2018-08-26 DIAGNOSIS — I6931 Attention and concentration deficit following cerebral infarction: Secondary | ICD-10-CM | POA: Diagnosis not present

## 2018-08-26 DIAGNOSIS — I63432 Cerebral infarction due to embolism of left posterior cerebral artery: Secondary | ICD-10-CM

## 2018-08-26 DIAGNOSIS — M6281 Muscle weakness (generalized): Secondary | ICD-10-CM | POA: Diagnosis not present

## 2018-08-26 DIAGNOSIS — N184 Chronic kidney disease, stage 4 (severe): Secondary | ICD-10-CM | POA: Diagnosis not present

## 2018-08-26 DIAGNOSIS — E1122 Type 2 diabetes mellitus with diabetic chronic kidney disease: Secondary | ICD-10-CM | POA: Diagnosis not present

## 2018-08-26 DIAGNOSIS — I129 Hypertensive chronic kidney disease with stage 1 through stage 4 chronic kidney disease, or unspecified chronic kidney disease: Secondary | ICD-10-CM | POA: Diagnosis not present

## 2018-08-26 DIAGNOSIS — I69398 Other sequelae of cerebral infarction: Secondary | ICD-10-CM | POA: Diagnosis not present

## 2018-08-26 DIAGNOSIS — Z7902 Long term (current) use of antithrombotics/antiplatelets: Secondary | ICD-10-CM | POA: Diagnosis not present

## 2018-08-26 DIAGNOSIS — Z7982 Long term (current) use of aspirin: Secondary | ICD-10-CM | POA: Diagnosis not present

## 2018-08-26 DIAGNOSIS — H53461 Homonymous bilateral field defects, right side: Secondary | ICD-10-CM | POA: Diagnosis not present

## 2018-08-26 LAB — CUP PACEART REMOTE DEVICE CHECK
Date Time Interrogation Session: 20200825191535
Implantable Pulse Generator Implant Date: 20200518

## 2018-08-27 ENCOUNTER — Encounter: Payer: Self-pay | Admitting: Occupational Therapy

## 2018-08-27 ENCOUNTER — Telehealth: Payer: Self-pay | Admitting: Occupational Therapy

## 2018-08-27 ENCOUNTER — Ambulatory Visit: Payer: Medicare HMO | Attending: Adult Health | Admitting: Occupational Therapy

## 2018-08-27 ENCOUNTER — Other Ambulatory Visit: Payer: Self-pay

## 2018-08-27 DIAGNOSIS — R278 Other lack of coordination: Secondary | ICD-10-CM

## 2018-08-27 DIAGNOSIS — R2689 Other abnormalities of gait and mobility: Secondary | ICD-10-CM | POA: Diagnosis not present

## 2018-08-27 DIAGNOSIS — I69318 Other symptoms and signs involving cognitive functions following cerebral infarction: Secondary | ICD-10-CM

## 2018-08-27 DIAGNOSIS — H53461 Homonymous bilateral field defects, right side: Secondary | ICD-10-CM

## 2018-08-27 DIAGNOSIS — M6281 Muscle weakness (generalized): Secondary | ICD-10-CM | POA: Diagnosis not present

## 2018-08-27 DIAGNOSIS — R41842 Visuospatial deficit: Secondary | ICD-10-CM | POA: Diagnosis not present

## 2018-08-27 DIAGNOSIS — R2681 Unsteadiness on feet: Secondary | ICD-10-CM

## 2018-08-27 DIAGNOSIS — R41840 Attention and concentration deficit: Secondary | ICD-10-CM

## 2018-08-27 NOTE — Therapy (Addendum)
[facility] [address] [place], [place], [address] Phone: [phone]   Fax:  [phone]  Occupational Therapy Evaluation  Patient Details  Name: Kimberly Castillo MRN: [id number] Date of Birth: 06-20-1933 Referring Provider (OT): Claris Gower, NP   Encounter Date: 08/27/2018  OT End of Session - 08/27/18 1537    Visit Number  1    Number of Visits  17    Date for OT Re-Evaluation  10/26/18    Authorization Type  [employer] prior auth required    Authorization Time Period  cert. 08/27/18-11/26/18    OT Start Time  0847    OT Stop Time  0930    OT Time Calculation (min)  43 min    Activity Tolerance  Patient tolerated treatment well    Behavior During Therapy  Restless   confused      Past Medical History:  Diagnosis Date  . A-fib ([place])   . Anxiety state 12/22/2013  . Arthritis    hands, back  . Arthritis, lumbar spine 12/19/2012  . Cataracts, bilateral   . Chronic lower back pain 12/19/2012  . Diabetes mellitus ([place])   . Diverticulitis   . Dysrhythmia   . GERD (gastroesophageal reflux disease)   . Glaucoma   . Headache    hx migraines  . Heart murmur   . History of shingles   . Hyperlipidemia   . Hypertension   . Numbness    rt side of rt leg  . Post herpetic neuralgia 12/19/2012    Past Surgical History:  Procedure Laterality Date  . ABDOMINAL HYSTERECTOMY  1964   partial  . COLON RESECTION N/A 09/02/2013   Procedure: [place] colectomy;  Surgeon: Excell Seltzer, MD;  Location: [facility];  Service: General;  Laterality: N/A;  . COLONOSCOPY    . COLOSTOMY TAKEDOWN N/A 11/09/2014   Procedure: TAKEDOWN [place] COLOSTOMY;  Surgeon: Excell Seltzer, MD;  Location: [facility];  Service: General;  Laterality: N/A;  . [place]   left  . LOOP RECORDER INSERTION N/A 05/19/2018   Procedure: LOOP RECORDER INSERTION;  Surgeon: Evans Lance, MD;  Location: [facility];  Service: Cardiovascular;  Laterality: N/A;  . POLYPECTOMY      There were no vitals filed for this visit.  Subjective Assessment - 08/27/18 0854    Subjective   I just sit around at home    Patient Stated Goals  unsure, agreed with OT goals after discussion    Currently in Pain?  No/denies        [facility] OT Assessment - 08/27/18 0001      Assessment   Medical Diagnosis  CVA    Referring Provider (OT)  Claris Gower, NP    Onset Date/Surgical Date  05/16/18    Hand Dominance  Right    Prior Therapy  hospitalized 05/16/18-05/19/18, home health therapies      Precautions   Precautions  Fall    Precaution Comments  cognitive deficits, R homonymous hemiopia       Balance Screen   Has the patient fallen in the past 6 months  No      Home  Environment   Family/patient expects to be discharged to:  Private residence    Lives With  Family   2 sons, daughter, daughter in law     Prior Function   Level of Independence  Independent    Leisure  word searches.  cleaning per son      ADL   ADL comments  Pt reports performing BADLs mod I      IADL   Light Housekeeping  --   enjoyed cleaning house previously, not currently   Prior Level of Function Meal Prep  independent, but didn't cook much prior per pt report    Meal Prep  --   has not performed   Prior Level of Function Medication Managment  independent    Medication Management  Is not capable of dispensing or managing own medication   family assists     Mobility   Mobility Status  Independent    Mobility Status Comments  uses cane in the community;  min A for LOB x2 with ambulation with environmental scanning, pt fatigued after approx 30-40 ft.      Written Expression   Dominant Hand  Right    Handwriting  --   only signs her name     Vision - History   Baseline Vision  --   wears glasses most of the time (wears more for reading)     Vision Assessment   Eye Alignment  Impaired (comment)    Visual Fields  Right  homonymous Hemianopsia   per son report and epic notes   Comment  misses things on R side per son.  Tabletop visual scanning with approx 60% accuracy and incr time.  Environmental scanning (simple, min distracting environment) with approx 60% accuracy.      Activity Tolerance   Activity Tolerance  --   tolerated ambulation/standing for <4 min with min A for LOB   Activity Tolerance Comments  how to stop and rest after approx 2 min of walking      Cognition   Overall Cognitive Status  Impaired/Different from baseline    Area of Impairment  Attention;Memory;Awareness    Memory  Decreased short-term memory    Awareness  Intellectual   decreased, but does acknowledge difficulty with memory    Cognition Comments  decr compensation for R visual vield deficit and decr awareness per son, needs to position things on L side now, pt needed redirection at times and cueing for accuracy of answering questions at times      Coordination   9 Hole Peg Test  Right;Left    Right 9 Hole Peg Test  49.69    Left 9 Hole Peg Test  34.25      ROM / Strength   AROM / PROM / Strength  AROM;Strength      AROM   Overall AROM   Within functional limits for tasks performed   BUEs grossly WNL     Strength   Overall Strength Comments  BUE proximal strength grossly WNL      Hand Function   Right Hand Grip (lbs)  17    Left Hand Grip (lbs)  44                      OT Education - 08/27/18 1533    Education Details  OT eval results/POC; Recommendation for PT (son reports that pt is receiving home health PT--informed son that pt could not continue with home therapy when coming to outpatient as insurance will not cover both, son agreed to continue with outpatient and recommendation to request MD referral for outpatient PT instead).    Person(s) Educated  Patient    Methods  Explanation    Comprehension  Verbalized understanding  OT Short Term Goals - 08/27/18 1942      OT SHORT TERM GOAL  #1   Title  Pt will perform simple HEP for coordination and hand strength with min cueing.--check STGs 09/27/18    Time  4    Period  Weeks    Status  New      OT SHORT TERM GOAL #2   Title  Pt will perform simple tabletop visual scanning with at least 90% accuracy to assist with ADLs and ability to complete word searches.    Time  4    Period  Weeks    Status  New      OT SHORT TERM GOAL #3   Title  Pt will be able to attend to functional task for at least 8 min without cueing/redirection.    Time  4    Period  Weeks    Status  New      OT SHORT TERM GOAL #4   Title  Pt will perform simple home maintenance task in standing for at least 62min prior to rest break.    Time  4    Period  Weeks    Status  New        OT Long Term Goals - 08/27/18 1947      OT LONG TERM GOAL #1   Title  Pt will verbalize understanding of visual compensations strategies for ADLs/IADLs.    Time  8    Period  Weeks    Status  New      OT LONG TERM GOAL #2   Title  Pt perform simple environmental scanning/navigation with at least 90% accuracy for improved safety.    Time  8    Period  Weeks    Status  New      OT LONG TERM GOAL #3   Title  Pt will improve coordination/visual scanning for ADLs as shown by improving time on 9-hole peg test by at least 6sec with R hand.    Time  8    Period  Weeks    Status  New      OT LONG TERM GOAL #4   Title  Pt will improve R dominant hand strength by at least 8lbs to assist with ADLs/IADLs.    Time  8    Period  Weeks    Status  New      OT LONG TERM GOAL #5   Title  Pt will perform simple home maintenance task in standing for at least 10 min prior to rest break.    Time  8    Period  Weeks    Status  New            Plan - 08/27/18 1549    Clinical Impression Statement  Pt is an 85 y.o. female s/p CVA 05/16/18.  Pt with PMH that includes a-fib, DM, HTN, migraines, and hyperlipidemia.  Pt was independent prior to CVA.  Pt presents today with R  homonymous hemianopia, cognitive deficits, decr functional mobility, decr coordination, decr strength.  Pt would benefit from occupational therapy to address these deficits for incr IADL performance, safety, and participation in leisure activities.    OT Occupational Profile and History  Detailed Assessment- Review of Records and additional review of physical, cognitive, psychosocial history related to current functional performance    Occupational performance deficits (Please refer to evaluation for details):  ADL's;IADL's;Leisure;Social Participation    Body Structure / Function / Physical Skills  ADL;Dexterity;Vision;IADL;Balance;Endurance;Mobility;Strength;FMC;Coordination;Decreased knowledge of precautions    Cognitive Skills  Attention;Problem Solve;Safety Awareness;Thought;Memory;Understand    Rehab Potential  Good    Clinical Decision Making  Several treatment options, min-mod task modification necessary    Comorbidities Affecting Occupational Performance:  May have comorbidities impacting occupational performance    Modification or Assistance to Complete Evaluation   Min-Moderate modification of tasks or assist with assess necessary to complete eval    OT Frequency  2x / week    OT Duration  8 weeks   +eval   OT Treatment/Interventions  Self-care/ADL training;Moist Heat;DME and/or AE instruction;Balance training;Therapeutic activities;Cognitive remediation/compensation;Therapeutic exercise;Neuromuscular education;Cryotherapy;Functional Mobility Training;Visual/perceptual remediation/compensation;Patient/family education;Manual Therapy;Energy conservation;Paraffin    Plan  check to see if PT referral is back/schedule, visual/cognitive HEP, visual scanning, simple IADL    Recommended Other Services  outpatient PT    Consulted and Agree with Plan of Care  Patient;Family member/caregiver    Family Member Consulted  son       Patient will benefit from skilled therapeutic intervention in order  to improve the following deficits and impairments:   Marketing executive / Function / Physical Skills: ADL, Dexterity, Vision, IADL, Balance, Endurance, Mobility, Strength, FMC, Coordination, Decreased knowledge of precautions Cognitive Skills: Attention, Problem Solve, Safety Awareness, Thought, Memory, Understand     Visit Diagnosis: Other symptoms and signs involving cognitive functions following cerebral infarction  Muscle weakness (generalized)  Other lack of coordination  Homonymous bilateral field defects, right side  Visuospatial deficit  Attention and concentration deficit  Other abnormalities of gait and mobility  Unsteadiness on feet    Problem List Patient Active Problem List   Diagnosis Date Noted  . Acute CVA (cerebrovascular accident) ([place]) 05/17/2018  . CVA (cerebral vascular accident) ([place]) 05/16/2018  . Noncompliance 05/16/2018  . Vitamin D deficiency 05/16/2018  . Abnormal EKG 05/16/2018  . Headache 05/16/2018  . Memory changes 05/13/2018  . Right knee pain 02/12/2017  . Spondylolisthesis, lumbar region 03/02/2016  . CKD (chronic kidney disease), stage III ([place]) 02/16/2016  . Chronic low back pain 07/12/2015  . Bilateral foot pain 01/26/2015  . Colostomy status ([place]) 11/09/2014  . Rash 06/24/2014  . Preventative health care 12/31/2013  . Anxiety state 12/22/2013  . Yeast vaginitis 12/22/2013  . Diverticulitis of colon with perforation s/p colectomy/ostomy 09/02/2013 09/02/2013  . Post herpetic neuralgia 12/19/2012  . Arthritis, lumbar spine 12/19/2012  . Glaucoma   . A-fib ([place])   . GERD (gastroesophageal reflux disease) 10/06/2012  . DM type 2 (diabetes mellitus, type 2) ([place]) 09/22/2012  . Hypertension 09/22/2012  . Hyperlipemia 09/22/2012    [facility] 08/27/2018, 8:04 PM  [facility] [address] [place], [place], [address] Phone: [phone]   Fax:  [phone]  Name: Kimberly Castillo MRN: [id number] Date of Birth: 06-01-1933   Vianne Bulls, OTR/L [facility] [address] [place], [place]  [address] [phone] phone [phone] 08/27/18 8:05 PM

## 2018-08-27 NOTE — Telephone Encounter (Signed)
Claris Gower, NP:  [name] was seen today for an outpatient occupational therapy evaluation.  She is scheduled for an outpatient speech therapy evaluation tomorrow as well.  Her son reports that she has been seeing home health physical therapy.  I informed them that insurance will not cover both home and outpatient therapies.  Therefore her son desires to cancel the remaining few home PT sessions and transition to outpatient therapies.  If you agree, could you please send a referral for outpatient physical therapy via epic so that she can be seen for all 3 disciplines at this site.  Pt continues to demo decr balance and activity tolerance for standing/ambulation.  Thank you,  Vianne Bulls, OTR/L [facility] [address] [place], [place]  [address] [phone] phone [phone] 08/27/18 8:19 PM

## 2018-08-28 ENCOUNTER — Ambulatory Visit: Payer: Medicare HMO | Admitting: Speech Pathology

## 2018-09-01 DIAGNOSIS — Z7902 Long term (current) use of antithrombotics/antiplatelets: Secondary | ICD-10-CM | POA: Diagnosis not present

## 2018-09-01 DIAGNOSIS — Z7982 Long term (current) use of aspirin: Secondary | ICD-10-CM | POA: Diagnosis not present

## 2018-09-01 DIAGNOSIS — N184 Chronic kidney disease, stage 4 (severe): Secondary | ICD-10-CM | POA: Diagnosis not present

## 2018-09-01 DIAGNOSIS — I6931 Attention and concentration deficit following cerebral infarction: Secondary | ICD-10-CM | POA: Diagnosis not present

## 2018-09-01 DIAGNOSIS — I129 Hypertensive chronic kidney disease with stage 1 through stage 4 chronic kidney disease, or unspecified chronic kidney disease: Secondary | ICD-10-CM | POA: Diagnosis not present

## 2018-09-01 DIAGNOSIS — H53461 Homonymous bilateral field defects, right side: Secondary | ICD-10-CM | POA: Diagnosis not present

## 2018-09-01 DIAGNOSIS — I69398 Other sequelae of cerebral infarction: Secondary | ICD-10-CM | POA: Diagnosis not present

## 2018-09-01 DIAGNOSIS — E1122 Type 2 diabetes mellitus with diabetic chronic kidney disease: Secondary | ICD-10-CM | POA: Diagnosis not present

## 2018-09-01 DIAGNOSIS — M6281 Muscle weakness (generalized): Secondary | ICD-10-CM | POA: Diagnosis not present

## 2018-09-01 NOTE — Telephone Encounter (Signed)
Discussion regarding coverage of home health and outpatient therapy as I was not sure if insurance would cover both.  I am more than happy to place an order for outpatient PT and they will be able to contact their home health agency to cancel remaining PT sessions at home.  Please let me know if you need anything further. Thank you, Janett Billow

## 2018-09-02 ENCOUNTER — Other Ambulatory Visit: Payer: Self-pay

## 2018-09-02 ENCOUNTER — Ambulatory Visit: Payer: Medicare HMO | Attending: Adult Health | Admitting: Speech Pathology

## 2018-09-02 ENCOUNTER — Ambulatory Visit: Payer: Self-pay | Admitting: *Deleted

## 2018-09-02 DIAGNOSIS — H53461 Homonymous bilateral field defects, right side: Secondary | ICD-10-CM | POA: Insufficient documentation

## 2018-09-02 DIAGNOSIS — R2681 Unsteadiness on feet: Secondary | ICD-10-CM | POA: Insufficient documentation

## 2018-09-02 DIAGNOSIS — R41840 Attention and concentration deficit: Secondary | ICD-10-CM | POA: Insufficient documentation

## 2018-09-02 DIAGNOSIS — R41842 Visuospatial deficit: Secondary | ICD-10-CM | POA: Insufficient documentation

## 2018-09-02 DIAGNOSIS — R2689 Other abnormalities of gait and mobility: Secondary | ICD-10-CM | POA: Insufficient documentation

## 2018-09-02 DIAGNOSIS — I69318 Other symptoms and signs involving cognitive functions following cerebral infarction: Secondary | ICD-10-CM | POA: Insufficient documentation

## 2018-09-02 DIAGNOSIS — R41841 Cognitive communication deficit: Secondary | ICD-10-CM | POA: Insufficient documentation

## 2018-09-02 DIAGNOSIS — R278 Other lack of coordination: Secondary | ICD-10-CM | POA: Insufficient documentation

## 2018-09-02 DIAGNOSIS — M6281 Muscle weakness (generalized): Secondary | ICD-10-CM | POA: Insufficient documentation

## 2018-09-02 NOTE — Telephone Encounter (Signed)
Summary: Pt's son wants to speak to nurse, unusual activity    Pt's son called to report that pt fell asleep during speech therapy appt. He gave her the medicine she takes for back pain an hour ahead of schedule and plans to call tomorrow to schedule an acute appt. He noticed other unusual behavior and wants to speak to a nurse to make sure he is addressing any potential emergencies. Please advise          Son gave her her tizandine on an empty stomach and made her really sleepy. He was worried and if she has any usual symptoms over night, he will call back and schedule an appointment for her.  Routing to the [facility] at [place] for review.

## 2018-09-02 NOTE — Progress Notes (Signed)
Carelink Summary Report / Loop Recorder 

## 2018-09-02 NOTE — Therapy (Signed)
Arrived-no charge. Pt communicative/responsive during initial portion of evaluation; as session progressed pt became somnolent and difficult to rouse; per son this is not typical for her. BP readings 79/43, 99/50. Stopped evaluation and advised son take pt to ED or urgent care. Son verbalized understanding.   [facility] [address] [place], [place], [address] Phone: [phone]   Fax:  [phone]  Patient Details  Name: Kimberly Castillo MRN: [id number] Date of Birth: 1933-10-18 Referring Provider:  Frann Rider, NP  Encounter Date: 09/02/2018  Deneise Lever, [place], [place] 09/02/2018, 5:53 PM  [facility] [address] [place], [place], [address] Phone: [phone]   Fax:  [phone]

## 2018-09-03 MED ORDER — TRAMADOL HCL 50 MG PO TABS: 50.0000 mg | ORAL_TABLET | Freq: Four times a day (QID) | ORAL | 0 refills | Status: DC | PRN

## 2018-09-03 NOTE — Addendum Note (Signed)
Addended by: Biagio Borg on: 09/03/2018 12:24 PM   Modules accepted: Orders

## 2018-09-03 NOTE — Telephone Encounter (Signed)
Pt's son Jeneen Rinks has been informed.

## 2018-09-03 NOTE — Telephone Encounter (Signed)
Ok to stop the muscle relaxer , and symptoms will resolve with med wearing off  There is no concern about an emergency or permanent issue of the side effects the son has noticed  Ok to change to tramadol 50 mg qid prn - done erx

## 2018-09-04 ENCOUNTER — Other Ambulatory Visit: Payer: Self-pay

## 2018-09-04 ENCOUNTER — Ambulatory Visit: Payer: Medicare HMO | Admitting: Speech Pathology

## 2018-09-04 DIAGNOSIS — M6281 Muscle weakness (generalized): Secondary | ICD-10-CM | POA: Diagnosis not present

## 2018-09-04 DIAGNOSIS — R278 Other lack of coordination: Secondary | ICD-10-CM | POA: Diagnosis not present

## 2018-09-04 DIAGNOSIS — R41840 Attention and concentration deficit: Secondary | ICD-10-CM | POA: Diagnosis not present

## 2018-09-04 DIAGNOSIS — R41841 Cognitive communication deficit: Secondary | ICD-10-CM

## 2018-09-04 DIAGNOSIS — R2681 Unsteadiness on feet: Secondary | ICD-10-CM | POA: Diagnosis not present

## 2018-09-04 DIAGNOSIS — R2689 Other abnormalities of gait and mobility: Secondary | ICD-10-CM | POA: Diagnosis not present

## 2018-09-04 DIAGNOSIS — H53461 Homonymous bilateral field defects, right side: Secondary | ICD-10-CM | POA: Diagnosis not present

## 2018-09-04 DIAGNOSIS — R41842 Visuospatial deficit: Secondary | ICD-10-CM | POA: Diagnosis not present

## 2018-09-04 DIAGNOSIS — I69318 Other symptoms and signs involving cognitive functions following cerebral infarction: Secondary | ICD-10-CM | POA: Diagnosis not present

## 2018-09-04 NOTE — Patient Instructions (Signed)
Many strategies we use to assist with short term memory problems involve taking notes, and being able to read and follow a calendar and schedule. Because Mrs. Pallas has some visual issues, this is going to be a challenge.   Try using repetition (repeating the same information periodically throughout the day) to help your mom remember what the schedule is for the day.   For example: "We have a therapy appointment at 1:00"  -Ask her after 1 minute: what are we doing today at 1:00? If she can tell you, ask again in 5 minutes, then 30 minutes, then 1 hour. If she can't remember, remind her, and then keep asking about it in increasing intervals. We will practice this in therapy.  -I recommend sticking to a routine as much as you can. Eat breakfast, take medications, personal care, etc at specific times.  To help with processing information, break it into "chunks" or small pieces and give them one at a time.
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
## 2018-09-09 ENCOUNTER — Other Ambulatory Visit: Payer: Self-pay

## 2018-09-09 ENCOUNTER — Ambulatory Visit: Payer: Medicare HMO | Admitting: Speech Pathology

## 2018-09-09 DIAGNOSIS — R278 Other lack of coordination: Secondary | ICD-10-CM | POA: Diagnosis not present

## 2018-09-09 DIAGNOSIS — M6281 Muscle weakness (generalized): Secondary | ICD-10-CM | POA: Diagnosis not present

## 2018-09-09 DIAGNOSIS — R2689 Other abnormalities of gait and mobility: Secondary | ICD-10-CM | POA: Diagnosis not present

## 2018-09-09 DIAGNOSIS — H53461 Homonymous bilateral field defects, right side: Secondary | ICD-10-CM | POA: Diagnosis not present

## 2018-09-09 DIAGNOSIS — R41840 Attention and concentration deficit: Secondary | ICD-10-CM | POA: Diagnosis not present

## 2018-09-09 DIAGNOSIS — R2681 Unsteadiness on feet: Secondary | ICD-10-CM | POA: Diagnosis not present

## 2018-09-09 DIAGNOSIS — R41842 Visuospatial deficit: Secondary | ICD-10-CM | POA: Diagnosis not present

## 2018-09-09 DIAGNOSIS — I69318 Other symptoms and signs involving cognitive functions following cerebral infarction: Secondary | ICD-10-CM | POA: Diagnosis not present

## 2018-09-09 DIAGNOSIS — R41841 Cognitive communication deficit: Secondary | ICD-10-CM | POA: Diagnosis not present

## 2018-09-09 NOTE — Patient Instructions (Signed)
Homework:  Every day, in the morning, choose 1-2 things that are important or unique about the day that you want Kendrick Fries to remember (Doctor's or therapy appointment, a visit from a friend).  Give her the information in small chunks (no more than 2 details at a time). Have her repeat this back to you.  Ask her about it in intervals: 1 minute, 5 minutes, 15 minutes, 30 minutes, 1 hour. If she can't remember, tell her again and start the process over.

## 2018-09-10 NOTE — Therapy (Signed)
[facility] [address] [place], [place], [address] Phone: [phone]   Fax:  [phone]  Speech Language Pathology Treatment  Patient Details  Name: Kimberly Castillo MRN: [id number] Date of Birth: March 14, 1933 Referring Provider (SLP): Claris Gower, NP   Encounter Date: 09/09/2018  End of Session - 09/10/18 0758    Visit Number  2    Number of Visits  9    Date for SLP Re-Evaluation  11/03/18    Authorization Type  [employer] Medicare prior auth required    Authorization Time Period  09/04/18    SLP Start Time  40    SLP Stop Time   1748    SLP Time Calculation (min)  42 min    Activity Tolerance  Patient tolerated treatment well       Past Medical History:  Diagnosis Date  . A-fib ([place])   . Anxiety state 12/22/2013  . Arthritis    hands, back  . Arthritis, lumbar spine 12/19/2012  . Cataracts, bilateral   . Chronic lower back pain 12/19/2012  . Diabetes mellitus ([place])   . Diverticulitis   . Dysrhythmia   . GERD (gastroesophageal reflux disease)   . Glaucoma   . Headache    hx migraines  . Heart murmur   . History of shingles   . Hyperlipidemia   . Hypertension   . Numbness    rt side of rt leg  . Post herpetic neuralgia 12/19/2012    Past Surgical History:  Procedure Laterality Date  . ABDOMINAL HYSTERECTOMY  1964   partial  . COLON RESECTION N/A 09/02/2013   Procedure: [place] colectomy;  Surgeon: Excell Seltzer, MD;  Location: [facility];  Service: General;  Laterality: N/A;  . COLONOSCOPY    . COLOSTOMY TAKEDOWN N/A 11/09/2014   Procedure: TAKEDOWN [place] COLOSTOMY;  Surgeon: Excell Seltzer, MD;  Location: [facility];  Service: General;  Laterality: N/A;  . [place]   left  . LOOP RECORDER INSERTION N/A 05/19/2018   Procedure: LOOP RECORDER INSERTION;  Surgeon: Evans Lance, MD;  Location: [facility];  Service: Cardiovascular;  Laterality: N/A;  .  POLYPECTOMY      There were no vitals filed for this visit.  Subjective Assessment - 09/09/18 1711    Subjective  "Not a thing."    Patient is accompained by:  Family member   son Jeneen Rinks   Currently in Pain?  No/denies            ADULT SLP TREATMENT - 09/10/18 0803      General Information   Behavior/Cognition  Alert;Distractible;Requires cueing      Treatment Provided   Treatment provided  Cognitive-Linquistic      Pain Assessment   Pain Assessment  No/denies pain      Cognitive-Linquistic Treatment   Treatment focused on  Cognition    Skilled Treatment  Educated re: therapy goals and proposed plan of care. During this brief conversation, pt looking around the room, unengaged in conversation unless SLP used pt's name directly. SLP initiated training today in strategies to facilitate functional recall and processing of information. Demonstrated gaining pt's attention prior to delivering information in "small chunks," and having her verbally confirm details. When SLP asked pt questions directly about her routine and preferences for taking medications, pt initiated a response but required verbal redirection after ~1 minute. Pt with awareness of this x1 "I lost my..." (train of thought). Educated  son that use of schedules/routines is advised. Strategized with son re: completing routine tasks in a specific order vs a time-based schedule, as this varies for them day to day. SLP trialed visual schedule designed with simple visual images/single words. Pt has right homonymous hemianopsia, and she was unable to ID words, images or letters from F:2. SLP used spaced retrieval with pt, who was able to recall details re: her next speech therapy appointment (Friday, 3:30) at 1 minute, 3 minute, 5 minute, 10 minute intervals. Homework is for family to do this daily with pt re: one or two novel or important events of the day (see pt instructions).      Assessment / Recommendations / Plan   Plan   Continue with current plan of care      Progression Toward Goals   Progression toward goals  Progressing toward goals       SLP Education - 09/10/18 0757    Education Details  "chunking" information; gaining pt's attention prior to giving instructions, spaced retrieval for functional recall    Person(s) Educated  Patient;Child(ren)    Methods  Explanation;Demonstration    Comprehension  Verbalized understanding;Need further instruction         SLP Long Term Goals - 09/09/18 1711      SLP LONG TERM GOAL #1   Title  Pt will demonstrate sustained attention for 8 minutes to a conversation of interest by asking follow-up questions or commenting appropriately x 2 sessions.    Time  4    Period  Weeks    Status  On-going      SLP LONG TERM GOAL #2   Title  Family/caregivers will demonstrate understanding of at least 2 ways to aid pt's processing, comprehension, and recall of information or instructions x3 sessions.    Time  4    Period  Weeks    Status  On-going      SLP LONG TERM GOAL #3   Title  Family will report use of strategies such as use of routines/schedules and spaced retrieval to assist pt with functional recall or orientation between 3 sessions.    Time  4    Period  Weeks    Status  On-going       Plan - 09/10/18 0801    Clinical Impression Statement  Mrs. Balin is a pleasant lady who presents with profound cognitive communication deficits, including recall, sustained attention, and awareness. Prior to CVA pt was independent, and enjoyed doing "find the word" books, housework, organizing, per son who contributes to history as pt is poor historian. Son expressed he hopes pt can improve her short-term memory for recalling people she has seen and conversations she has had. SLP educated that given severity of deficits, therapy goals will focus on compensations and how to appropriately assist pt with recall and information processing at home. As pt with visual deficits, it is  unlikely that visual aids will be a functional compensation for her. Pt enjoys talking but reports she has had difficulty keeping up in conversations recently. I recommend brief course of skilled ST to train pt/caregiver in strategies to compensate for memory and attention impairments, and to aid in language processing.    Speech Therapy Frequency  2x / week    Duration  4 weeks    Treatment/Interventions  Patient/family education;SLP instruction and feedback;Functional tasks;Cueing hierarchy;Internal/external aids;Compensatory techniques;Cognitive reorganization    Potential to Achieve Goals  Good    Potential Considerations  Severity of impairments;Ability  to learn/carryover information    Consulted and Agree with Plan of Care  Patient;Family member/caregiver       Patient will benefit from skilled therapeutic intervention in order to improve the following deficits and impairments:   Cognitive communication deficit    Problem List Patient Active Problem List   Diagnosis Date Noted  . Acute CVA (cerebrovascular accident) ([place]) 05/17/2018  . CVA (cerebral vascular accident) ([place]) 05/16/2018  . Noncompliance 05/16/2018  . Vitamin D deficiency 05/16/2018  . Abnormal EKG 05/16/2018  . Headache 05/16/2018  . Memory changes 05/13/2018  . Right knee pain 02/12/2017  . Spondylolisthesis, lumbar region 03/02/2016  . CKD (chronic kidney disease), stage III ([place]) 02/16/2016  . Chronic low back pain 07/12/2015  . Bilateral foot pain 01/26/2015  . Colostomy status ([place]) 11/09/2014  . Rash 06/24/2014  . Preventative health care 12/31/2013  . Anxiety state 12/22/2013  . Yeast vaginitis 12/22/2013  . Diverticulitis of colon with perforation s/p colectomy/ostomy 09/02/2013 09/02/2013  . Post herpetic neuralgia 12/19/2012  . Arthritis, lumbar spine 12/19/2012  . Glaucoma   . A-fib ([place])   . GERD (gastroesophageal reflux disease) 10/06/2012  . DM type 2 (diabetes mellitus, type 2) ([place])  09/22/2012  . Hypertension 09/22/2012  . Hyperlipemia 09/22/2012   Deneise Lever, [place], [place] 09/10/2018, 8:16 AM  [facility] [address] [place], [place], [address] Phone: [phone]   Fax:  [phone]   Name: MALISSIE MUSGRAVE MRN: [id number] Date of Birth: 09-05-1933

## 2018-09-12 ENCOUNTER — Other Ambulatory Visit: Payer: Self-pay

## 2018-09-12 ENCOUNTER — Encounter: Payer: Self-pay | Admitting: Occupational Therapy

## 2018-09-12 ENCOUNTER — Ambulatory Visit: Payer: Medicare HMO

## 2018-09-12 ENCOUNTER — Ambulatory Visit: Payer: Medicare HMO | Admitting: Occupational Therapy

## 2018-09-12 ENCOUNTER — Encounter

## 2018-09-12 DIAGNOSIS — R2681 Unsteadiness on feet: Secondary | ICD-10-CM

## 2018-09-12 DIAGNOSIS — H53461 Homonymous bilateral field defects, right side: Secondary | ICD-10-CM | POA: Diagnosis not present

## 2018-09-12 DIAGNOSIS — I69318 Other symptoms and signs involving cognitive functions following cerebral infarction: Secondary | ICD-10-CM

## 2018-09-12 DIAGNOSIS — R2689 Other abnormalities of gait and mobility: Secondary | ICD-10-CM

## 2018-09-12 DIAGNOSIS — R278 Other lack of coordination: Secondary | ICD-10-CM

## 2018-09-12 DIAGNOSIS — R41841 Cognitive communication deficit: Secondary | ICD-10-CM

## 2018-09-12 DIAGNOSIS — R41840 Attention and concentration deficit: Secondary | ICD-10-CM | POA: Diagnosis not present

## 2018-09-12 DIAGNOSIS — R41842 Visuospatial deficit: Secondary | ICD-10-CM | POA: Diagnosis not present

## 2018-09-12 DIAGNOSIS — M6281 Muscle weakness (generalized): Secondary | ICD-10-CM

## 2018-09-12 NOTE — Therapy (Signed)
[facility] [address] [place], [place], [address] Phone: [phone]   Fax:  [phone]  Occupational Therapy Treatment  Patient Details  Name: Kimberly Castillo MRN: [id number] Date of Birth: 10-27-1933 Referring Provider (OT): Claris Gower, NP   Encounter Date: 09/12/2018  OT End of Session - 09/12/18 1242    Visit Number  2    Number of Visits  17    Date for OT Re-Evaluation  10/26/18    Authorization Type  [employer] Medicare prior auth required    Authorization Time Period  cert. 08/27/18-11/26/18    OT Start Time  1236    OT Stop Time  1315    OT Time Calculation (min)  39 min    Activity Tolerance  Patient tolerated treatment well    Behavior During Therapy  Restless   confused      Past Medical History:  Diagnosis Date  . A-fib ([place])   . Anxiety state 12/22/2013  . Arthritis    hands, back  . Arthritis, lumbar spine 12/19/2012  . Cataracts, bilateral   . Chronic lower back pain 12/19/2012  . Diabetes mellitus ([place])   . Diverticulitis   . Dysrhythmia   . GERD (gastroesophageal reflux disease)   . Glaucoma   . Headache    hx migraines  . Heart murmur   . History of shingles   . Hyperlipidemia   . Hypertension   . Numbness    rt side of rt leg  . Post herpetic neuralgia 12/19/2012    Past Surgical History:  Procedure Laterality Date  . ABDOMINAL HYSTERECTOMY  1964   partial  . COLON RESECTION N/A 09/02/2013   Procedure: [place] colectomy;  Surgeon: Excell Seltzer, MD;  [facility]: [facility];  Service: General;  Laterality: N/A;  . COLONOSCOPY    . COLOSTOMY TAKEDOWN N/A 11/09/2014   Procedure: TAKEDOWN [place] COLOSTOMY;  Surgeon: Excell Seltzer, MD;  Location: [facility];  Service: General;  Laterality: N/A;  . [place]   left  . LOOP RECORDER INSERTION N/A 05/19/2018   Procedure: LOOP RECORDER INSERTION;  Surgeon: Evans Lance, MD;  Location: [facility];  Service: Cardiovascular;  Laterality: N/A;  . POLYPECTOMY      There were no vitals filed for this visit.  Subjective Assessment - 09/12/18 1241    Subjective   Just feel tight.  "The numbers keep changing"    Patient Stated Goals  unsure, agreed with OT goals after discussion    Currently in Pain?  No/denies        25M Visual scanning/number cancellation sheet with approx 90% accuracy with incr time and min cues to confirm number to look for, errors with 90% accuracy.    Attempted to match cards by number for tabletop scanning with max difficulty/cueing (for attention direction) despite reducing card choice to 3.    Attempted sorting pegs by color to place in medication organizer with max cueing (verbal, demo/visual).  Pt unable, therefore attempted to sort into piles by color with max difficulty/cueing.  Pt able to initiate with 1 color, but could not complete and sorted incorrect color.  Transfer w/c>chair with min-mod A/cues when transferring to R side due to visual field cut and did not initiate head turn.  Transfer to L side with supervision.        OT Education - 09/12/18 1445    Education Details  Cognitive/Visual HEP--see pt instructions  Person(s) Educated  Patient;Child(ren)   discussed/reviewed with son at end of session   Methods  Explanation;Handout    Comprehension  Verbalized understanding       OT Short Term Goals - 08/27/18 1942      OT SHORT TERM GOAL #1   Title  Pt will perform simple HEP for coordination and hand strength with min cueing.--check STGs 09/27/18    Time  4    Period  Weeks    Status  New      OT SHORT TERM GOAL #2   Title  Pt will perform simple tabletop visual scanning with at least 90% accuracy to assist with ADLs and ability to complete word searches.    Time  4    Period  Weeks    Status  New      OT SHORT TERM GOAL #3   Title  Pt will be able to attend to functional task for at least 8 min without cueing/redirection.     Time  4    Period  Weeks    Status  New      OT SHORT TERM GOAL #4   Title  Pt will perform simple home maintenance task in standing for at least 53min prior to rest break.    Time  4    Period  Weeks    Status  New        OT Long Term Goals - 08/27/18 1947      OT LONG TERM GOAL #1   Title  Pt will verbalize understanding of visual compensations strategies for ADLs/IADLs.    Time  8    Period  Weeks    Status  New      OT LONG TERM GOAL #2   Title  Pt perform simple environmental scanning/navigation with at least 90% accuracy for improved safety.    Time  8    Period  Weeks    Status  New      OT LONG TERM GOAL #3   Title  Pt will improve coordination/visual scanning for ADLs as shown by improving time on 9-hole peg test by at least 6sec with R hand.    Time  8    Period  Weeks    Status  New      OT LONG TERM GOAL #4   Title  Pt will improve R dominant hand strength by at least 8lbs to assist with ADLs/IADLs.    Time  8    Period  Weeks    Status  New      OT LONG TERM GOAL #5   Title  Pt will perform simple home maintenance task in standing for at least 10 min prior to rest break.    Time  8    Period  Weeks    Status  New            Plan - 09/12/18 1242    Clinical Impression Statement  Pt demo improved tabletop visual scanning initially; however, demo significant difficulty by the end of the sesion with following directions/completing tasks.  Attention deficits after first 10 minutes are likely contributing to difficulty.    OT Occupational Profile and History  Detailed Assessment- Review of Records and additional review of physical, cognitive, psychosocial history related to current functional performance    Occupational performance deficits (Please refer to evaluation for details):  ADL's;IADL's;Leisure;Social Participation    Body Structure / Function / Physical Skills   ADL;Dexterity;Vision;IADL;Balance;Endurance;Mobility;Strength;FMC;Coordination;Decreased  knowledge of precautions    Cognitive Skills  Attention;Problem Solve;Safety Awareness;Thought;Memory;Understand    Rehab Potential  Good    Clinical Decision Making  Several treatment options, min-mod task modification necessary    Comorbidities Affecting Occupational Performance:  May have comorbidities impacting occupational performance    Modification or Assistance to Complete Evaluation   Min-Moderate modification of tasks or assist with assess necessary to complete eval    OT Frequency  2x / week    OT Duration  8 weeks   +eval   OT Treatment/Interventions  Self-care/ADL training;Moist Heat;DME and/or AE instruction;Balance training;Therapeutic activities;Cognitive remediation/compensation;Therapeutic exercise;Neuromuscular education;Cryotherapy;Functional Mobility Training;Visual/perceptual remediation/compensation;Patient/family education;Manual Therapy;Energy conservation;Paraffin    Plan  visual scanning, simple IADL/functional task, simple coordination/hand strength HEP--may need quiet room and alternate between tasks that are less cognitively demanding    Recommended Other Services  outpatient PT    Consulted and Agree with Plan of Care  Patient;Family member/caregiver    Family Member Consulted  son       Patient will benefit from skilled therapeutic intervention in order to improve the following deficits and impairments:   Marketing executive / Function / Physical Skills: ADL, Dexterity, Vision, IADL, Balance, Endurance, Mobility, Strength, FMC, Coordination, Decreased knowledge of precautions Cognitive Skills: Attention, Problem Solve, Safety Awareness, Thought, Memory, Understand     Visit Diagnosis: Other symptoms and signs involving cognitive functions following cerebral infarction  Homonymous bilateral field defects, right side  Visuospatial deficit  Other lack of coordination  Other  abnormalities of gait and mobility  Unsteadiness on feet  Muscle weakness (generalized)    Problem List Patient Active Problem List   Diagnosis Date Noted  . Acute CVA (cerebrovascular accident) ([place]) 05/17/2018  . CVA (cerebral vascular accident) ([place]) 05/16/2018  . Noncompliance 05/16/2018  . Vitamin D deficiency 05/16/2018  . Abnormal EKG 05/16/2018  . Headache 05/16/2018  . Memory changes 05/13/2018  . Right knee pain 02/12/2017  . Spondylolisthesis, lumbar region 03/02/2016  . CKD (chronic kidney disease), stage III ([place]) 02/16/2016  . Chronic low back pain 07/12/2015  . Bilateral foot pain 01/26/2015  . Colostomy status ([place]) 11/09/2014  . Rash 06/24/2014  . Preventative health care 12/31/2013  . Anxiety state 12/22/2013  . Yeast vaginitis 12/22/2013  . Diverticulitis of colon with perforation s/p colectomy/ostomy 09/02/2013 09/02/2013  . Post herpetic neuralgia 12/19/2012  . Arthritis, lumbar spine 12/19/2012  . Glaucoma   . A-fib ([place])   . GERD (gastroesophageal reflux disease) 10/06/2012  . DM type 2 (diabetes mellitus, type 2) ([place]) 09/22/2012  . Hypertension 09/22/2012  . Hyperlipemia 09/22/2012    [facility] 09/12/2018, 2:45 PM  [facility] [address] [place], [place], [address] Phone: [phone]   Fax:  [phone]  Name: CYNTHIS PURINGTON MRN: [id number] Date of Birth: 03-07-1933   Vianne Bulls, OTR/L [facility] [address] [place], [place]  [address] [phone] phone [phone] 09/12/18 2:45 PM

## 2018-09-12 NOTE — Patient Instructions (Signed)
    Activities for Cognition, Memory, and Vision:  Only try to work 5-10 minutes at a time for now as it is hard to focus for longer  1. Simple Jigsaw puzzles (12 pieces or less)--may need help 2. Simple Card/board games ( familiar games would be best--card matching) 3. Talking on the phone/social events 4. Simple Online matching games 5. Word searches--may need to look for a letter instead of work (for example, circle all the "E's") 6. Simple crafts--coloring, painting, etc 7. Try to write a sentence about what you did today. 8.  Sorting objects:   Try sorting beads buttons by color or size.  Try sorting silverware in drawer, sorting clothes, sorting coins by type. 9. Read aloud or copy sentence or phone number.

## 2018-09-12 NOTE — Therapy (Signed)
[facility] [address] [place], [place], [address] Phone: [phone]   Fax:  [phone]  Speech Language Pathology Treatment  Patient Details  Name: Kimberly Castillo MRN: [id number] Date of Birth: 01/18/1933 Referring Provider (SLP): Frann Rider, NP   Encounter Date: 09/12/2018  End of Session - 09/12/18 1632    Visit Number  3    Number of Visits  9    Date for SLP Re-Evaluation  11/03/18    Authorization Type  [employer] Medicare prior auth required    Authorization Time Period  09/04/18 - 10-01-18    SLP Start Time  1536    SLP Stop Time   1615    SLP Time Calculation (min)  39 min    Activity Tolerance  Patient tolerated treatment well       Past Medical History:  Diagnosis Date  . A-fib ([place])   . Anxiety state 12/22/2013  . Arthritis    hands, back  . Arthritis, lumbar spine 12/19/2012  . Cataracts, bilateral   . Chronic lower back pain 12/19/2012  . Diabetes mellitus ([place])   . Diverticulitis   . Dysrhythmia   . GERD (gastroesophageal reflux disease)   . Glaucoma   . Headache    hx migraines  . Heart murmur   . History of shingles   . Hyperlipidemia   . Hypertension   . Numbness    rt side of rt leg  . Post herpetic neuralgia 12/19/2012    Past Surgical History:  Procedure Laterality Date  . ABDOMINAL HYSTERECTOMY  1964   partial  . COLON RESECTION N/A 09/02/2013   Procedure: [place] colectomy;  Surgeon: Excell Seltzer, MD;  [facility];  Service: General;  Laterality: N/A;  . COLONOSCOPY    . COLOSTOMY TAKEDOWN N/A 11/09/2014   Procedure: TAKEDOWN [place] COLOSTOMY;  Surgeon: Excell Seltzer, MD;  [facility];  Service: General;  Laterality: N/A;  . [place]   left  . LOOP RECORDER INSERTION N/A 05/19/2018   Procedure: LOOP RECORDER INSERTION;  Surgeon: Evans Lance, MD;  Location: [facility];  Service: Cardiovascular;  Laterality: N/A;   . POLYPECTOMY      There were no vitals filed for this visit.  Subjective Assessment - 09/12/18 1542    Subjective  "Where we goin?" Son falling asleep during session.    Patient is accompained by:  --   Dan Humphreys   Currently in Pain?  No/denies            ADULT SLP TREATMENT - 09/12/18 1543      General Information   Behavior/Cognition  Alert;Distractible;Requires cueing      Treatment Provided   Treatment provided  Cognitive-Linquistic      Cognitive-Linquistic Treatment   Treatment focused on  Cognition    Skilled Treatment  Pt recalled seeing her grandson with min cues, and her daughter in law since last session. Son reports doing spaced retrieval with MD appointments and visitors in the last week. Pt son rec'd call in therapy session from pt other son telling Jeneen Rinks he is bring somehting for pt to eat, not roast beef. SLP used this to practice spaced retreival with foils/distractors about pt family members. Pt lost ability to recall with 5 minute interval, consistently x4, and req'd mod cues consistently, before the 5th attempt.  SLP educated and modeled spaced retrieval for son, requiring to call his name 2-3 times during  the session to wake him up. Pt somnolent x2 during session as well, requiring SLP to ask pt if she would like some water. Pt verbally joking with son at times about "I don't need to remember nothing anyway. I'm too old." SLP reiterated to son that the only way spaced retrieval works is that if there is someone committed to ask the recalled information at the intervals. Son told SLP he was committed to doing that. "I need to get my brother and my sister to do it too," he stated. Son providing aprpriate cues for pt for recall of target information.       Assessment / Recommendations / Plan   Plan  Continue with current plan of care      Progression Toward Goals   Progression toward goals  Progressing toward goals       SLP Education - 09/12/18 1631     Education Details  how to do spaced retrieval, rationale for spaced retrieval, handout descriing spaced retrieval    Person(s) Educated  Patient;Child(ren)    Methods  Explanation;Demonstration    Comprehension  Verbalized understanding;Returned demonstration;Need further instruction         SLP Long Term Goals - 09/12/18 1634      SLP LONG TERM GOAL #1   Title  Pt will demonstrate sustained attention for 8 minutes to a conversation of interest by asking follow-up questions or commenting appropriately x 2 sessions.    Time  4    Period  Weeks    Status  On-going      SLP LONG TERM GOAL #2   Title  Family/caregivers will demonstrate understanding of at least 2 ways to aid pt's processing, comprehension, and recall of information or instructions x3 sessions.    Time  4    Period  Weeks    Status  On-going      SLP LONG TERM GOAL #3   Title  Family will report use of strategies such as use of routines/schedules and spaced retrieval to assist pt with functional recall or orientation between 3 sessions.    Time  4    Period  Weeks    Status  On-going       Plan - 09/12/18 1633    Clinical Impression Statement  Mrs. Jarvie is a pleasant lady who presents with profound cognitive communication deficits, including recall, sustained attention, and awareness.SLP reiterated use of spaced retrieval today and SLP demonstrated to son how to do so. I recommend brief course of skilled ST to cont, to train pt/caregiver in strategies to compensate for memory and attention impairments, and to aid in language processing.    Speech Therapy Frequency  2x / week    Duration  4 weeks    Treatment/Interventions  Patient/family education;SLP instruction and feedback;Functional tasks;Cueing hierarchy;Internal/external aids;Compensatory techniques;Cognitive reorganization    Potential to Achieve Goals  Good    Potential Considerations  Severity of impairments;Ability to learn/carryover information     Consulted and Agree with Plan of Care  Patient;Family member/caregiver       Patient will benefit from skilled therapeutic intervention in order to improve the following deficits and impairments:   Cognitive communication deficit    Problem List Patient Active Problem List   Diagnosis Date Noted  . Acute CVA (cerebrovascular accident) ([place]) 05/17/2018  . CVA (cerebral vascular accident) ([place]) 05/16/2018  . Noncompliance 05/16/2018  . Vitamin D deficiency 05/16/2018  . Abnormal EKG 05/16/2018  . Headache 05/16/2018  . Memory  changes 05/13/2018  . Right knee pain 02/12/2017  . Spondylolisthesis, lumbar region 03/02/2016  . CKD (chronic kidney disease), stage III ([place]) 02/16/2016  . Chronic low back pain 07/12/2015  . Bilateral foot pain 01/26/2015  . Colostomy status ([place]) 11/09/2014  . Rash 06/24/2014  . Preventative health care 12/31/2013  . Anxiety state 12/22/2013  . Yeast vaginitis 12/22/2013  . Diverticulitis of colon with perforation s/p colectomy/ostomy 09/02/2013 09/02/2013  . Post herpetic neuralgia 12/19/2012  . Arthritis, lumbar spine 12/19/2012  . Glaucoma   . A-fib ([place])   . GERD (gastroesophageal reflux disease) 10/06/2012  . DM type 2 (diabetes mellitus, type 2) ([place]) 09/22/2012  . Hypertension 09/22/2012  . Hyperlipemia 09/22/2012    [facility] ,MS, CCC-SLP  09/12/2018, 4:35 PM  [facility] [address] [place], [place], [address] Phone: [phone]   Fax:  [phone]   Name: Kimberly Castillo MRN: [id number] Date of Birth: [birth year]

## 2018-09-12 NOTE — Patient Instructions (Signed)
   Continue with the memory activities that [facility] described to you last session.

## 2018-09-17 ENCOUNTER — Other Ambulatory Visit: Payer: Self-pay

## 2018-09-17 ENCOUNTER — Other Ambulatory Visit: Payer: Self-pay | Admitting: Internal Medicine

## 2018-09-17 ENCOUNTER — Ambulatory Visit: Payer: Medicare HMO

## 2018-09-17 ENCOUNTER — Ambulatory Visit: Payer: Medicare HMO | Admitting: Occupational Therapy

## 2018-09-17 ENCOUNTER — Telehealth: Payer: Self-pay | Admitting: Internal Medicine

## 2018-09-17 DIAGNOSIS — I69318 Other symptoms and signs involving cognitive functions following cerebral infarction: Secondary | ICD-10-CM

## 2018-09-17 DIAGNOSIS — R41842 Visuospatial deficit: Secondary | ICD-10-CM

## 2018-09-17 DIAGNOSIS — R278 Other lack of coordination: Secondary | ICD-10-CM | POA: Diagnosis not present

## 2018-09-17 DIAGNOSIS — M6281 Muscle weakness (generalized): Secondary | ICD-10-CM | POA: Diagnosis not present

## 2018-09-17 DIAGNOSIS — R41840 Attention and concentration deficit: Secondary | ICD-10-CM | POA: Diagnosis not present

## 2018-09-17 DIAGNOSIS — R41841 Cognitive communication deficit: Secondary | ICD-10-CM

## 2018-09-17 DIAGNOSIS — R2681 Unsteadiness on feet: Secondary | ICD-10-CM | POA: Diagnosis not present

## 2018-09-17 DIAGNOSIS — H53461 Homonymous bilateral field defects, right side: Secondary | ICD-10-CM | POA: Diagnosis not present

## 2018-09-17 DIAGNOSIS — R2689 Other abnormalities of gait and mobility: Secondary | ICD-10-CM | POA: Diagnosis not present

## 2018-09-17 NOTE — Therapy (Signed)
[facility] [address] [place], [place], [address] Phone: [phone]   Fax:  [phone]  Occupational Therapy Treatment  Patient Details  Name: Kimberly Castillo MRN: [id number] Date of Birth: 03-25-1933 Referring Provider (OT): Claris Gower, NP   Encounter Date: 09/17/2018  OT End of Session - 09/17/18 4742    Visit Number  3    Number of Visits  17    Date for OT Re-Evaluation  10/26/18    Authorization Type  [employer] Medicare prior auth required    Authorization Time Period  cert. 08/27/18-11/26/18    OT Start Time  1618    OT Stop Time  1658    OT Time Calculation (min)  40 min    Activity Tolerance  Patient tolerated treatment well    Behavior During Therapy  Restless   confused      Past Medical History:  Diagnosis Date  . A-fib ([place])   . Anxiety state 12/22/2013  . Arthritis    hands, back  . Arthritis, lumbar spine 12/19/2012  . Cataracts, bilateral   . Chronic lower back pain 12/19/2012  . Diabetes mellitus ([place])   . Diverticulitis   . Dysrhythmia   . GERD (gastroesophageal reflux disease)   . Glaucoma   . Headache    hx migraines  . Heart murmur   . History of shingles   . Hyperlipidemia   . Hypertension   . Numbness    rt side of rt leg  . Post herpetic neuralgia 12/19/2012    Past Surgical History:  Procedure Laterality Date  . ABDOMINAL HYSTERECTOMY  1964   partial  . COLON RESECTION N/A 09/02/2013   Procedure: [place] colectomy;  Surgeon: Excell Seltzer, MD;  [facility];  Service: General;  Laterality: N/A;  . COLONOSCOPY    . COLOSTOMY TAKEDOWN N/A 11/09/2014   Procedure: TAKEDOWN [place] COLOSTOMY;  Surgeon: Excell Seltzer, MD;  [facility];  Service: General;  Laterality: N/A;  . [place]   left  . LOOP RECORDER INSERTION N/A 05/19/2018   Procedure: LOOP RECORDER INSERTION;  Surgeon: Evans Lance, MD;  Location: [facility];  Service: Cardiovascular;  Laterality: N/A;  . POLYPECTOMY      There were no vitals filed for this visit.  Subjective Assessment - 09/17/18 1643    Patient Stated Goals  unsure, agreed with OT goals after discussion    Currently in Pain?  No/denies                  Treatment: Attempted copying phone numbers from left column to right, max difficulty and v.c due to cognition and visual skills. Placing pieces into perfection pegboard, max difficulty/ v.c pt does not scan effectively, v.c for use of RUE. Standing to place graded clothespins on vertical antennae with RUE, max difficulty/ v.c for correct orientation, pt placed red and yellow. Ambulating while peforming scanning environmental scanning, minguard with pt using cane and max v.c for scanning to right. Pt arrived to tx in w/c today. Therapist discussed pt's visual deficits with pt's son at end of session, and importance of having pt scan to right side. Therapist encouraged pt's son to assist her with walking in instead of riding in w/c for increased independence.             OT Short Term Goals - 08/27/18 1942      OT SHORT TERM GOAL #1  Title  Pt will perform simple HEP for coordination and hand strength with min cueing.--check STGs 09/27/18    Time  4    Period  Weeks    Status  New      OT SHORT TERM GOAL #2   Title  Pt will perform simple tabletop visual scanning with at least 90% accuracy to assist with ADLs and ability to complete word searches.    Time  4    Period  Weeks    Status  New      OT SHORT TERM GOAL #3   Title  Pt will be able to attend to functional task for at least 8 min without cueing/redirection.    Time  4    Period  Weeks    Status  New      OT SHORT TERM GOAL #4   Title  Pt will perform simple home maintenance task in standing for at least 31min prior to rest break.    Time  4    Period  Weeks    Status  New        OT Long Term Goals - 08/27/18 1947      OT  LONG TERM GOAL #1   Title  Pt will verbalize understanding of visual compensations strategies for ADLs/IADLs.    Time  8    Period  Weeks    Status  New      OT LONG TERM GOAL #2   Title  Pt perform simple environmental scanning/navigation with at least 90% accuracy for improved safety.    Time  8    Period  Weeks    Status  New      OT LONG TERM GOAL #3   Title  Pt will improve coordination/visual scanning for ADLs as shown by improving time on 9-hole peg test by at least 6sec with R hand.    Time  8    Period  Weeks    Status  New      OT LONG TERM GOAL #4   Title  Pt will improve R dominant hand strength by at least 8lbs to assist with ADLs/IADLs.    Time  8    Period  Weeks    Status  New      OT LONG TERM GOAL #5   Title  Pt will perform simple home maintenance task in standing for at least 10 min prior to rest break.    Time  8    Period  Weeks    Status  New              Patient will benefit from skilled therapeutic intervention in order to improve the following deficits and impairments:           Visit Diagnosis: No diagnosis found.    Problem List Patient Active Problem List   Diagnosis Date Noted  . Acute CVA (cerebrovascular accident) ([place]) 05/17/2018  . CVA (cerebral vascular accident) ([place]) 05/16/2018  . Noncompliance 05/16/2018  . Vitamin D deficiency 05/16/2018  . Abnormal EKG 05/16/2018  . Headache 05/16/2018  . Memory changes 05/13/2018  . Right knee pain 02/12/2017  . Spondylolisthesis, lumbar region 03/02/2016  . CKD (chronic kidney disease), stage III ([place]) 02/16/2016  . Chronic low back pain 07/12/2015  . Bilateral foot pain 01/26/2015  . Colostomy status ([place]) 11/09/2014  . Rash 06/24/2014  . Preventative health care 12/31/2013  . Anxiety state 12/22/2013  . Yeast vaginitis 12/22/2013  .  Diverticulitis of colon with perforation s/p colectomy/ostomy 09/02/2013 09/02/2013  . Post herpetic neuralgia 12/19/2012  . Arthritis,  lumbar spine 12/19/2012  . Glaucoma   . A-fib ([place])   . GERD (gastroesophageal reflux disease) 10/06/2012  . DM type 2 (diabetes mellitus, type 2) ([place]) 09/22/2012  . Hypertension 09/22/2012  . Hyperlipemia 09/22/2012    Azalee Weimer 09/17/2018, 4:44 PM  [facility] [address] [place], [place], [address] Phone: [phone]   Fax:  [phone]  Name: LAQUIESHA PIACENTE MRN: [id number] Date of Birth: 1933/10/19

## 2018-09-17 NOTE — Therapy (Signed)
[facility] [address] [place], [place], [address] Phone: [phone]   Fax:  [phone]  Speech Language Pathology Treatment  Patient Details  Name: Kimberly Castillo MRN: [id number] Date of Birth: November 14, 1933 Referring Provider (SLP): Claris Gower, NP   Encounter Date: 09/17/2018  End of Session - 09/17/18 1638    Visit Number  4    Number of Visits  9    Date for SLP Re-Evaluation  11/03/18    Authorization Time Period  09/04/18 - 10-01-18    SLP Start Time  1533    SLP Stop Time   1615    SLP Time Calculation (min)  42 min    Activity Tolerance  Patient tolerated treatment well       Past Medical History:  Diagnosis Date  . A-fib ([place])   . Anxiety state 12/22/2013  . Arthritis    hands, back  . Arthritis, lumbar spine 12/19/2012  . Cataracts, bilateral   . Chronic lower back pain 12/19/2012  . Diabetes mellitus ([place])   . Diverticulitis   . Dysrhythmia   . GERD (gastroesophageal reflux disease)   . Glaucoma   . Headache    hx migraines  . Heart murmur   . History of shingles   . Hyperlipidemia   . Hypertension   . Numbness    rt side of rt leg  . Post herpetic neuralgia 12/19/2012    Past Surgical History:  Procedure Laterality Date  . ABDOMINAL HYSTERECTOMY  1964   partial  . COLON RESECTION N/A 09/02/2013   Procedure: [place] colectomy;  Surgeon: Excell Seltzer, MD;  Location: [facility];  Service: General;  Laterality: N/A;  . COLONOSCOPY    . COLOSTOMY TAKEDOWN N/A 11/09/2014   Procedure: TAKEDOWN [place] COLOSTOMY;  Surgeon: Excell Seltzer, MD;  Location: [facility];  Service: General;  Laterality: N/A;  . [place]   left  . LOOP RECORDER INSERTION N/A 05/19/2018   Procedure: LOOP RECORDER INSERTION;  Surgeon: Evans Lance, MD;  Location: [facility];  Service: Cardiovascular;  Laterality: N/A;  . POLYPECTOMY      There were no vitals filed for this  visit.  Subjective Assessment - 09/17/18 1540    Subjective  "Mama your grandaughter Sheree lives down in [place]."    Currently in Pain?  No/denies            ADULT SLP TREATMENT - 09/17/18 1543      General Information   Behavior/Cognition  Alert;Distractible;Requires cueing      Treatment Provided   Treatment provided  Cognitive-Linquistic      Cognitive-Linquistic Treatment   Treatment focused on  Cognition    Skilled Treatment  Son Jeneen Rinks has been trying using spaced retrieval with pt's meds. Explained rationale for spaced retrieval and that procedureal memory was the best opportunity for pt to recall things and that was the reason for the spaced retrieval. SLP asked pt how many meds she had in the PM and she didn't know. SLP used # of meds as spaced retrieval item for pt. she recalled at one minute but req'd two intervals of three minutes to recall # of meds, and pt recalled at 5 minutes. At 7 minute interval pt did not recall and therapy ceased. In doing this during the session,, SLP modeled task of spaced retrieval for son to use at home for the second straight session.  James used visual cues and  repetition cues for pt to recall KAthryn's name (after 3 minutes, pt recalled with min cue).      Assessment / Recommendations / Plan   Plan  Continue with current plan of care;Other (Comment)   LTG #1 downgraded     Progression Toward Goals   Progression toward goals  Progressing toward goals       SLP Education - 09/17/18 1635    Education Details  modeled spaced retireival for son, procedural memory affords pt best opportunity for memory of something, rationale for spaced retrieval    Person(s) Educated  Patient;Child(ren)    Methods  Explanation;Demonstration;Verbal cues    Comprehension  Verbalized understanding;Verbal cues required         SLP Long Term Goals - 09/17/18 1639      SLP LONG TERM GOAL #1   Title  Pt will demonstrate sustained attention for 6  minutes to a conversation of interest by asking follow-up questions or commenting appropriately x 2 sessions.    Time  3    Period  Weeks    Status  On-going      SLP LONG TERM GOAL #2   Title  Family/caregivers will demonstrate understanding of at least 2 ways to aid pt's processing, comprehension, and recall of information or instructions x3 sessions.    Baseline  09-17-18    Time  3    Period  Weeks    Status  On-going      SLP LONG TERM GOAL #3   Title  Family will report use of strategies such as use of routines/schedules and spaced retrieval to assist pt with functional recall or orientation between 3 sessions.    Baseline  09-12-18, 09-17-18    Time  3    Period  Weeks    Status  On-going       Plan - 09/17/18 1638    Clinical Impression Statement  Mrs. Delahunt is a pleasant lady who cont to present with profound cognitive communication deficits, including recall, sustained attention, and awareness. SLP again reiterated and modeled use of spaced retrieval today for son to use at home with items/things pertinent for pt. I recommend brief course of skilled ST to cont, to train pt/caregiver in strategies to compensate for memory and attention impairments, and to aid in language processing. Likely d/c in 2-4 visits    Speech Therapy Frequency  2x / week    Duration  4 weeks    Treatment/Interventions  Patient/family education;SLP instruction and feedback;Functional tasks;Cueing hierarchy;Internal/external aids;Compensatory techniques;Cognitive reorganization    Potential to Achieve Goals  Good    Potential Considerations  Severity of impairments;Ability to learn/carryover information    Consulted and Agree with Plan of Care  Patient;Family member/caregiver       Patient will benefit from skilled therapeutic intervention in order to improve the following deficits and impairments:   Cognitive communication deficit    Problem List Patient Active Problem List   Diagnosis Date Noted   . Acute CVA (cerebrovascular accident) ([place]) 05/17/2018  . CVA (cerebral vascular accident) ([place]) 05/16/2018  . Noncompliance 05/16/2018  . Vitamin D deficiency 05/16/2018  . Abnormal EKG 05/16/2018  . Headache 05/16/2018  . Memory changes 05/13/2018  . Right knee pain 02/12/2017  . Spondylolisthesis, lumbar region 03/02/2016  . CKD (chronic kidney disease), stage III ([place]) 02/16/2016  . Chronic low back pain 07/12/2015  . Bilateral foot pain 01/26/2015  . Colostomy status ([place]) 11/09/2014  . Rash 06/24/2014  . Preventative health  care 12/31/2013  . Anxiety state 12/22/2013  . Yeast vaginitis 12/22/2013  . Diverticulitis of colon with perforation s/p colectomy/ostomy 09/02/2013 09/02/2013  . Post herpetic neuralgia 12/19/2012  . Arthritis, lumbar spine 12/19/2012  . Glaucoma   . A-fib ([place])   . GERD (gastroesophageal reflux disease) 10/06/2012  . DM type 2 (diabetes mellitus, type 2) ([place]) 09/22/2012  . Hypertension 09/22/2012  . Hyperlipemia 09/22/2012    [facility] ,MS, CCC-SLP  09/17/2018, 4:43 PM  [facility] [address] [place], [place], [address] Phone: [phone]   Fax:  [phone]   Name: Kimberly Castillo MRN: [id number] Date of Birth: 09/10/1933

## 2018-09-17 NOTE — Telephone Encounter (Signed)
Pt's son called in to make provider aware that the pharmacy would only give pt 20 pills of her Tramadol Rx.

## 2018-09-18 ENCOUNTER — Encounter: Payer: Self-pay | Admitting: Occupational Therapy

## 2018-09-19 ENCOUNTER — Ambulatory Visit: Payer: Medicare HMO

## 2018-09-19 ENCOUNTER — Ambulatory Visit: Payer: Medicare HMO | Admitting: Occupational Therapy

## 2018-09-23 ENCOUNTER — Other Ambulatory Visit: Payer: Self-pay

## 2018-09-23 ENCOUNTER — Ambulatory Visit: Payer: Medicare HMO | Admitting: Speech Pathology

## 2018-09-23 ENCOUNTER — Encounter: Payer: Self-pay | Admitting: Occupational Therapy

## 2018-09-23 ENCOUNTER — Ambulatory Visit: Payer: Medicare HMO | Admitting: Occupational Therapy

## 2018-09-23 DIAGNOSIS — I69318 Other symptoms and signs involving cognitive functions following cerebral infarction: Secondary | ICD-10-CM | POA: Diagnosis not present

## 2018-09-23 DIAGNOSIS — R41841 Cognitive communication deficit: Secondary | ICD-10-CM

## 2018-09-23 DIAGNOSIS — R278 Other lack of coordination: Secondary | ICD-10-CM

## 2018-09-23 DIAGNOSIS — R2689 Other abnormalities of gait and mobility: Secondary | ICD-10-CM | POA: Diagnosis not present

## 2018-09-23 DIAGNOSIS — R41842 Visuospatial deficit: Secondary | ICD-10-CM | POA: Diagnosis not present

## 2018-09-23 DIAGNOSIS — M6281 Muscle weakness (generalized): Secondary | ICD-10-CM | POA: Diagnosis not present

## 2018-09-23 DIAGNOSIS — H53461 Homonymous bilateral field defects, right side: Secondary | ICD-10-CM

## 2018-09-23 DIAGNOSIS — R41840 Attention and concentration deficit: Secondary | ICD-10-CM | POA: Diagnosis not present

## 2018-09-23 DIAGNOSIS — R2681 Unsteadiness on feet: Secondary | ICD-10-CM | POA: Diagnosis not present

## 2018-09-23 NOTE — Therapy (Signed)
[facility] [address] [place], [place], [address] Phone: [phone]   Fax:  [phone]  Speech Language Pathology Treatment  Patient Details  Name: Kimberly Castillo MRN: [id number] Date of Birth: 15-Dec-1933 Referring Provider (SLP): Kimberly Gower, NP   Encounter Date: 09/23/2018  End of Session - 09/23/18 1648    Visit Number  5    Number of Visits  9    Date for SLP Re-Evaluation  11/03/18    Authorization Type  [employer] Medicare prior auth required    SLP Start Time  1530    SLP Stop Time   1612    SLP Time Calculation (min)  42 min    Activity Tolerance  Patient tolerated treatment well       Past Medical History:  Diagnosis Date  . A-fib ([place])   . Anxiety state 12/22/2013  . Arthritis    hands, back  . Arthritis, lumbar spine 12/19/2012  . Cataracts, bilateral   . Chronic lower back pain 12/19/2012  . Diabetes mellitus ([place])   . Diverticulitis   . Dysrhythmia   . GERD (gastroesophageal reflux disease)   . Glaucoma   . Headache    hx migraines  . Heart murmur   . History of shingles   . Hyperlipidemia   . Hypertension   . Numbness    rt side of rt leg  . Post herpetic neuralgia 12/19/2012    Past Surgical History:  Procedure Laterality Date  . ABDOMINAL HYSTERECTOMY  1964   partial  . COLON RESECTION N/A 09/02/2013   Procedure: [place] colectomy;  Surgeon: Excell Seltzer, MD;  [facility];  Service: General;  Laterality: N/A;  . COLONOSCOPY    . COLOSTOMY TAKEDOWN N/A 11/09/2014   Procedure: TAKEDOWN [place] COLOSTOMY;  Surgeon: Excell Seltzer, MD;  [facility];  Service: General;  Laterality: N/A;  . [place]   left  . LOOP RECORDER INSERTION N/A 05/19/2018   Procedure: LOOP RECORDER INSERTION;  Surgeon: Evans Lance, MD;  Location: [facility];  Service: Cardiovascular;  Laterality: N/A;  . POLYPECTOMY      There were no vitals  filed for this visit.  Subjective Assessment - 09/23/18 1537    Subjective  "I say 8 cause it hurts,"    Currently in Pain?  Yes    Pain Score  8     Pain Location  Back    Pain Orientation  Posterior    Pain Descriptors / Indicators  Aching    Pain Type  Chronic pain    Pain Onset  More than a month ago            ADULT SLP TREATMENT - 09/23/18 1538      General Information   Behavior/Cognition  Alert;Distractible;Requires cueing      Treatment Provided   Treatment provided  Cognitive-Linquistic      Pain Assessment   Pain Assessment  No/denies pain      Cognitive-Linquistic Treatment   Treatment focused on  Cognition    Skilled Treatment  Son Jeneen Rinks stated, "I learned some things," from ST last session about how to work with pt. He verbalized strategy of using routine with pt for procedural memory but states "that's not really how we do things. We don't like schedules." He reports "she remembers things that interest her," and so SLP encouraged son to use strategies with pt to recall things that are  salient for pt. Pt sustained attention to conversation re: her grandson for 6 minutes. SLP demo'd using spaced retrieval with pt when she asked where her grandson lives ([place] Pt did not initially recall at 1 min, but on second attempt recalled at 1 min, 3 min, 7 min. SLP worked with pt and son to ID functional cognitive activities for home, including having conversation with son re: a family member, calling the family member on the phone, then immediate recall of conversation. Son reports grandson loaded games on pt's phone, and SLP questioned whether pt would be able to do these given visual deficits. Pt able to immediately recall image f:3 41% on tablet application, usual mod cues for attention. SLP set up [employer] therapy account for pt and son to work with pt on activities at home. Son in agreement that next session will be our last session, with focus on how to continue  working with pt at home on functional cognitive tasks.      Assessment / Recommendations / Plan   Plan  Continue with current plan of care      Progression Toward Goals   Progression toward goals  Progressing toward goals           SLP Long Term Goals - 09/23/18 1650      SLP LONG TERM GOAL #1   Title  Pt will demonstrate sustained attention for 6 minutes to a conversation of interest by asking follow-up questions or commenting appropriately x 2 sessions.    Baseline  09/23/18    Time  2    Period  Weeks    Status  On-going      SLP LONG TERM GOAL #2   Title  Family/caregivers will demonstrate understanding of at least 2 ways to aid pt's processing, comprehension, and recall of information or instructions x3 sessions.    Baseline  09-17-18, 09-23-18    Time  2    Period  Weeks    Status  On-going      SLP LONG TERM GOAL #3   Title  Family will report use of strategies such as use of routines/schedules and spaced retrieval to assist pt with functional recall or orientation between 3 sessions.    Baseline  09-12-18, 09-17-18    Time  3    Period  Weeks    Status  On-going       Plan - 09/23/18 1649    Clinical Impression Statement  [name] is a pleasant lady who cont to present with profound cognitive communication deficits, including recall, sustained attention, and awareness. SLP again reiterated and modeled use of spaced retrieval today for son to use at home with items/things pertinent for pt. I recommend brief course of skilled ST to cont, to train pt/caregiver in strategies to compensate for memory and attention impairments, and to aid in language processing. Plan to d/c next visit; pt/son in agreement.    Speech Therapy Frequency  2x / week    Duration  4 weeks    Treatment/Interventions  Patient/family education;SLP instruction and feedback;Functional tasks;Cueing hierarchy;Internal/external aids;Compensatory techniques;Cognitive reorganization    Potential to Achieve  Goals  Good    Potential Considerations  Severity of impairments;Ability to learn/carryover information    Consulted and Agree with Plan of Care  Patient;Family member/caregiver       Patient will benefit from skilled therapeutic intervention in order to improve the following deficits and impairments:   Cognitive communication deficit    Problem List  Patient Active Problem List   Diagnosis Date Noted  . Acute CVA (cerebrovascular accident) ([place]) 05/17/2018  . CVA (cerebral vascular accident) ([place]) 05/16/2018  . Noncompliance 05/16/2018  . Vitamin D deficiency 05/16/2018  . Abnormal EKG 05/16/2018  . Headache 05/16/2018  . Memory changes 05/13/2018  . Right knee pain 02/12/2017  . Spondylolisthesis, lumbar region 03/02/2016  . CKD (chronic kidney disease), stage III ([place]) 02/16/2016  . Chronic low back pain 07/12/2015  . Bilateral foot pain 01/26/2015  . Colostomy status ([place]) 11/09/2014  . Rash 06/24/2014  . Preventative health care 12/31/2013  . Anxiety state 12/22/2013  . Yeast vaginitis 12/22/2013  . Diverticulitis of colon with perforation s/p colectomy/ostomy 09/02/2013 09/02/2013  . Post herpetic neuralgia 12/19/2012  . Arthritis, lumbar spine 12/19/2012  . Glaucoma   . A-fib ([place])   . GERD (gastroesophageal reflux disease) 10/06/2012  . DM type 2 (diabetes mellitus, type 2) ([place]) 09/22/2012  . Hypertension 09/22/2012  . Hyperlipemia 09/22/2012   Deneise Lever, [place], [place] 09/23/2018, 4:52 PM  [facility] [address] [place], [place], [address] Phone: [phone]   Fax:  [phone]   Name: Kimberly Castillo MRN: [id number] Date of Birth: 1933-05-11

## 2018-09-23 NOTE — Therapy (Signed)
[facility] [address] [place], [place], [address] Phone: [phone]   Fax:  [phone]  Occupational Therapy Treatment  Patient Details  Name: Kimberly Castillo MRN: [id number] Date of Birth: [birth year] Referring Provider (OT): Claris Gower, NP   Encounter Date: 09/23/2018  OT End of Session - 09/23/18 1514    Visit Number  4    Number of Visits  17    Date for OT Re-Evaluation  10/26/18    Authorization Type  [employer] Medicare prior auth required    Authorization Time Period  cert. 08/27/18-11/26/18    OT Start Time  1448    OT Stop Time  1528    OT Time Calculation (min)  40 min    Activity Tolerance  Patient tolerated treatment well    Behavior During Therapy  Restless   confused      Past Medical History:  Diagnosis Date  . A-fib ([place])   . Anxiety state 12/22/2013  . Arthritis    hands, back  . Arthritis, lumbar spine 12/19/2012  . Cataracts, bilateral   . Chronic lower back pain 12/19/2012  . Diabetes mellitus ([place])   . Diverticulitis   . Dysrhythmia   . GERD (gastroesophageal reflux disease)   . Glaucoma   . Headache    hx migraines  . Heart murmur   . History of shingles   . Hyperlipidemia   . Hypertension   . Numbness    rt side of rt leg  . Post herpetic neuralgia 12/19/2012    Past Surgical History:  Procedure Laterality Date  . ABDOMINAL HYSTERECTOMY  1964   partial  . COLON RESECTION N/A 09/02/2013   Procedure: [place] colectomy;  Surgeon: Excell Seltzer, MD;  [facility];  Service: General;  Laterality: N/A;  . COLONOSCOPY    . COLOSTOMY TAKEDOWN N/A 11/09/2014   Procedure: TAKEDOWN [place] COLOSTOMY;  Surgeon: Excell Seltzer, MD;  [facility];  Service: General;  Laterality: N/A;  . [place]   left  . LOOP RECORDER INSERTION N/A 05/19/2018   Procedure: LOOP RECORDER INSERTION;  Surgeon: Evans Lance, MD;  Location: [facility];  Service: Cardiovascular;  Laterality: N/A;  . POLYPECTOMY      There were no vitals filed for this visit.  Subjective Assessment - 09/23/18 1500    Subjective   Pt states"it hurts real bad"(re: back)    Currently in Pain?  Yes    Pain Score  8     Pain Location  Back    Pain Orientation  Posterior    Pain Descriptors / Indicators  Aching    Pain Type  Chronic pain    Pain Onset  More than a month ago    Pain Frequency  Intermittent    Aggravating Factors   standing, bending    Pain Relieving Factors  sitting, meds    Multiple Pain Sites  No                 Treatment:Standing x 1.5 mins to fold towels, then pt had to sit due to back pain. Pt folded remainder of towels using bilateral UE's  In seated. Card matching task, max difficulty, so therapist switched to having pt identify if cards were same color or number, pt performed with improved sucess, yet still mod v.c Handwriting activity, with pt writing her name then writing her children's names, with increased time and max encouragement  Pt ambulated  to ST with max v.c             OT Short Term Goals - 08/27/18 1942      OT SHORT TERM GOAL #1   Title  Pt will perform simple HEP for coordination and hand strength with min cueing.--check STGs 09/27/18    Time  4    Period  Weeks    Status  New      OT SHORT TERM GOAL #2   Title  Pt will perform simple tabletop visual scanning with at least 90% accuracy to assist with ADLs and ability to complete word searches.    Time  4    Period  Weeks    Status  New      OT SHORT TERM GOAL #3   Title  Pt will be able to attend to functional task for at least 8 min without cueing/redirection.    Time  4    Period  Weeks    Status  New      OT SHORT TERM GOAL #4   Title  Pt will perform simple home maintenance task in standing for at least 82min prior to rest break.    Time  4    Period  Weeks    Status  New        OT Long Term Goals - 08/27/18 1947       OT LONG TERM GOAL #1   Title  Pt will verbalize understanding of visual compensations strategies for ADLs/IADLs.    Time  8    Period  Weeks    Status  New      OT LONG TERM GOAL #2   Title  Pt perform simple environmental scanning/navigation with at least 90% accuracy for improved safety.    Time  8    Period  Weeks    Status  New      OT LONG TERM GOAL #3   Title  Pt will improve coordination/visual scanning for ADLs as shown by improving time on 9-hole peg test by at least 6sec with R hand.    Time  8    Period  Weeks    Status  New      OT LONG TERM GOAL #4   Title  Pt will improve R dominant hand strength by at least 8lbs to assist with ADLs/IADLs.    Time  8    Period  Weeks    Status  New      OT LONG TERM GOAL #5   Title  Pt will perform simple home maintenance task in standing for at least 10 min prior to rest break.    Time  8    Period  Weeks    Status  New            Plan - 09/23/18 1521    Clinical Impression Statement  Pt progress is  limited due to cognitve and visual perceptual deficits. Therrapist to provide pt/ caregiver education regarding familiar tasks for home in the future.    OT Occupational Profile and History  Detailed Assessment- Review of Records and additional review of physical, cognitive, psychosocial history related to current functional performance    Occupational performance deficits (Please refer to evaluation for details):  ADL's;IADL's;Leisure;Social Participation    Body Structure / Function / Physical Skills  ADL;Dexterity;Vision;IADL;Balance;Endurance;Mobility;Strength;FMC;Coordination;Decreased knowledge of precautions    Cognitive Skills  Attention;Problem Solve;Safety Awareness;Thought;Memory;Understand    Rehab Potential  Good  Clinical Decision Making  Several treatment options, min-mod task modification necessary    Comorbidities Affecting Occupational Performance:  May have comorbidities impacting occupational  performance    Modification or Assistance to Complete Evaluation   Min-Moderate modification of tasks or assist with assess necessary to complete eval    OT Frequency  2x / week    OT Duration  8 weeks   +eval   OT Treatment/Interventions  Self-care/ADL training;Moist Heat;DME and/or AE instruction;Balance training;Therapeutic activities;Cognitive remediation/compensation;Therapeutic exercise;Neuromuscular education;Cryotherapy;Functional Mobility Training;Visual/perceptual remediation/compensation;Patient/family education;Manual Therapy;Energy conservation;Paraffin    Plan  caregiver education regarding ways to maximize pt's indepdnence at home    Recommended Other Services  outpatient PT    Consulted and Agree with Plan of Care  Patient;Family member/caregiver    Family Member Consulted  son       Patient will benefit from skilled therapeutic intervention in order to improve the following deficits and impairments:   Body Structure / Function / Physical Skills: ADL, Dexterity, Vision, IADL, Balance, Endurance, Mobility, Strength, FMC, Coordination, Decreased knowledge of precautions Cognitive Skills: Attention, Problem Solve, Safety Awareness, Thought, Memory, Understand     Visit Diagnosis: Attention and concentration deficit  Muscle weakness (generalized)  Other lack of coordination  Visuospatial deficit  Homonymous bilateral field defects, right side    Problem List Patient Active Problem List   Diagnosis Date Noted  . Acute CVA (cerebrovascular accident) ([place]) 05/17/2018  . CVA (cerebral vascular accident) ([place]) 05/16/2018  . Noncompliance 05/16/2018  . Vitamin D deficiency 05/16/2018  . Abnormal EKG 05/16/2018  . Headache 05/16/2018  . Memory changes 05/13/2018  . Right knee pain 02/12/2017  . Spondylolisthesis, lumbar region 03/02/2016  . CKD (chronic kidney disease), stage III ([place]) 02/16/2016  . Chronic low back pain 07/12/2015  . Bilateral foot pain 01/26/2015   . Colostomy status ([place]) 11/09/2014  . Rash 06/24/2014  . Preventative health care 12/31/2013  . Anxiety state 12/22/2013  . Yeast vaginitis 12/22/2013  . Diverticulitis of colon with perforation s/p colectomy/ostomy 09/02/2013 09/02/2013  . Post herpetic neuralgia 12/19/2012  . Arthritis, lumbar spine 12/19/2012  . Glaucoma   . A-fib ([place])   . GERD (gastroesophageal reflux disease) 10/06/2012  . DM type 2 (diabetes mellitus, type 2) ([place]) 09/22/2012  . Hypertension 09/22/2012  . Hyperlipemia 09/22/2012    Landon Truax 09/23/2018, 3:23 PM  [facility] [address] [place], [place], [address] Phone: [phone]   Fax:  [phone]  Name: Kimberly Castillo MRN: [id number] Date of Birth: 11-Oct-1933

## 2018-09-23 NOTE — Patient Instructions (Signed)
Cognitive activities for home:  Phone calls: 1. Talk with Adreana about somebody in your family (like Vonna Kotyk) and review key details ("he's in [place]"). 2. Ranita to call and talk with them on the phone. 3. Have a conversation with Noelia immediately after and have her summarize what they talked about.   Download [employer] Therapy on iPad or use laptop and go to [email or web address].  Log in: [email or web address] Password: [email or web address]

## 2018-09-25 ENCOUNTER — Ambulatory Visit: Payer: Medicare HMO | Admitting: Speech Pathology

## 2018-09-25 ENCOUNTER — Ambulatory Visit: Payer: Medicare HMO | Admitting: Occupational Therapy

## 2018-09-25 ENCOUNTER — Other Ambulatory Visit: Payer: Self-pay

## 2018-09-25 DIAGNOSIS — M6281 Muscle weakness (generalized): Secondary | ICD-10-CM

## 2018-09-25 DIAGNOSIS — I69318 Other symptoms and signs involving cognitive functions following cerebral infarction: Secondary | ICD-10-CM | POA: Diagnosis not present

## 2018-09-25 DIAGNOSIS — R41840 Attention and concentration deficit: Secondary | ICD-10-CM | POA: Diagnosis not present

## 2018-09-25 DIAGNOSIS — R278 Other lack of coordination: Secondary | ICD-10-CM

## 2018-09-25 DIAGNOSIS — R41842 Visuospatial deficit: Secondary | ICD-10-CM | POA: Diagnosis not present

## 2018-09-25 DIAGNOSIS — R2689 Other abnormalities of gait and mobility: Secondary | ICD-10-CM | POA: Diagnosis not present

## 2018-09-25 DIAGNOSIS — R2681 Unsteadiness on feet: Secondary | ICD-10-CM | POA: Diagnosis not present

## 2018-09-25 DIAGNOSIS — R41841 Cognitive communication deficit: Secondary | ICD-10-CM | POA: Diagnosis not present

## 2018-09-25 DIAGNOSIS — H53461 Homonymous bilateral field defects, right side: Secondary | ICD-10-CM | POA: Diagnosis not present

## 2018-09-25 NOTE — Patient Instructions (Signed)
  Coordination Activities  Perform the following activities for 20 minutes 1 times per day with right hand(s).   Rotate ball in fingertips (clockwise and counter-clockwise).  Toss ball between hands.  Flip cards 1 at a time as fast as you can.  Deal cards with your thumb (Hold deck in hand and push card off top with thumb).  Pick up coins and place in container or coin bank.  Pick up coins and stack.  Pick up coins one at a time until you get 5-10 in your hand, then move coins from palm to fingertips to stack one at a time.    1. Grip Strengthening (Resistive Putty)   Squeeze putty using thumb and all fingers. Repeat _20___ times. Do __2__ sessions per day.   2. Roll putty into tube on table and pinch between each finger and thumb x 10 reps each. (can do ring and small finger together)     Copyright  VHI. All rights reserved.

## 2018-09-25 NOTE — Therapy (Signed)
[facility] [address] [place], [place], [address] Phone: [phone]   Fax:  [phone]  Occupational Therapy Treatment  Patient Details  Name: Kimberly Castillo MRN: [id number] Date of Birth: December 09, 1933 Referring Provider (OT): Claris Gower, NP   Encounter Date: 09/25/2018  OT End of Session - 09/25/18 1528    Visit Number  5    Number of Visits  17    Date for OT Re-Evaluation  10/26/18    Authorization Type  [employer] Medicare prior auth required    Authorization Time Period  cert. 08/27/18-11/26/18    OT Start Time  1450    OT Stop Time  1528    OT Time Calculation (min)  38 min    Activity Tolerance  Patient tolerated treatment well    Behavior During Therapy  Restless   confused      Past Medical History:  Diagnosis Date  . A-fib ([place])   . Anxiety state 12/22/2013  . Arthritis    hands, back  . Arthritis, lumbar spine 12/19/2012  . Cataracts, bilateral   . Chronic lower back pain 12/19/2012  . Diabetes mellitus ([place])   . Diverticulitis   . Dysrhythmia   . GERD (gastroesophageal reflux disease)   . Glaucoma   . Headache    hx migraines  . Heart murmur   . History of shingles   . Hyperlipidemia   . Hypertension   . Numbness    rt side of rt leg  . Post herpetic neuralgia 12/19/2012    Past Surgical History:  Procedure Laterality Date  . ABDOMINAL HYSTERECTOMY  1964   partial  . COLON RESECTION N/A 09/02/2013   Procedure: [facility] [place] colectomy;  Surgeon: Excell Seltzer, MD;  Location: [facility];  Service: General;  Laterality: N/A;  . COLONOSCOPY    . COLOSTOMY TAKEDOWN N/A 11/09/2014   Procedure: TAKEDOWN [facility] [place] COLOSTOMY;  Surgeon: Excell Seltzer, MD;  Location: [facility];  Service: General;  Laterality: N/A;  . [place]   left  . LOOP RECORDER INSERTION N/A 05/19/2018   Procedure: LOOP RECORDER INSERTION;  Surgeon: Evans Lance, MD;  Location: [facility];  Service: Cardiovascular;  Laterality: N/A;  . POLYPECTOMY      There were no vitals filed for this visit.  Subjective Assessment - 09/25/18 1457    Subjective   Pt reports I get up if I'm feeling good    Currently in Pain?  No/denies   Pt reports pain when she is standing   Pain Onset  More than a month ago              Discussion with pt/ son that therapy going forward would focus on very functional activities and that we will likely d/c sooner than initally anticipated due to severity of cognitive and visual deficits. Pt sees PT for evaluation next week.             OT Education - 09/25/18 1518    Education Details  coordination, putty HEP    Person(s) Educated  Patient;Child(ren)   discussed/reviewed with son at end of session   Methods  Explanation;Handout    Comprehension  Verbalized understanding;Returned demonstration;Verbal cues required   min-mod v.c      OT Short Term Goals - 09/25/18 1459      OT SHORT TERM GOAL #1   Title  Pt will perform simple HEP for coordination and hand strength with  min cueing.--check STGs 09/27/18    Time  4    Period  Weeks    Status  New      OT SHORT TERM GOAL #2   Title  Pt will perform simple tabletop visual scanning with at least 90% accuracy to assist with ADLs and ability to complete word searches.    Time  4    Period  Weeks    Status  New      OT SHORT TERM GOAL #3   Title  Pt will be able to attend to functional task for at least 8 min without cueing/redirection.    Time  4    Period  Weeks    Status  New      OT SHORT TERM GOAL #4   Title  Pt will perform simple home maintenance task in standing for at least 82min prior to rest break.    Time  4    Period  Weeks    Status  New        OT Long Term Goals - 08/27/18 1947      OT LONG TERM GOAL #1   Title  Pt will verbalize understanding of visual compensations strategies for ADLs/IADLs.    Time  8    Period  Weeks    Status  New      OT  LONG TERM GOAL #2   Title  Pt perform simple environmental scanning/navigation with at least 90% accuracy for improved safety.    Time  8    Period  Weeks    Status  New      OT LONG TERM GOAL #3   Title  Pt will improve coordination/visual scanning for ADLs as shown by improving time on 9-hole peg test by at least 6sec with R hand.    Time  8    Period  Weeks    Status  New      OT LONG TERM GOAL #4   Title  Pt will improve R dominant hand strength by at least 8lbs to assist with ADLs/IADLs.    Time  8    Period  Weeks    Status  New      OT LONG TERM GOAL #5   Title  Pt will perform simple home maintenance task in standing for at least 10 min prior to rest break.    Time  8    Period  Weeks    Status  New              Patient will benefit from skilled therapeutic intervention in order to improve the following deficits and impairments:           Visit Diagnosis: Attention and concentration deficit  Muscle weakness (generalized)  Other lack of coordination  Visuospatial deficit  Homonymous bilateral field defects, right side    Problem List Patient Active Problem List   Diagnosis Date Noted  . Acute CVA (cerebrovascular accident) ([place]) 05/17/2018  . CVA (cerebral vascular accident) ([place]) 05/16/2018  . Noncompliance 05/16/2018  . Vitamin D deficiency 05/16/2018  . Abnormal EKG 05/16/2018  . Headache 05/16/2018  . Memory changes 05/13/2018  . Right knee pain 02/12/2017  . Spondylolisthesis, lumbar region 03/02/2016  . CKD (chronic kidney disease), stage III ([place]) 02/16/2016  . Chronic low back pain 07/12/2015  . Bilateral foot pain 01/26/2015  . Colostomy status ([place]) 11/09/2014  . Rash 06/24/2014  . Preventative health care 12/31/2013  . Anxiety state  12/22/2013  . Yeast vaginitis 12/22/2013  . Diverticulitis of colon with perforation s/p colectomy/ostomy 09/02/2013 09/02/2013  . Post herpetic neuralgia 12/19/2012  . Arthritis, lumbar spine  12/19/2012  . Glaucoma   . A-fib ([place])   . GERD (gastroesophageal reflux disease) 10/06/2012  . DM type 2 (diabetes mellitus, type 2) ([place]) 09/22/2012  . Hypertension 09/22/2012  . Hyperlipemia 09/22/2012    RINE,KATHRYN 09/25/2018, 3:30 PM  [facility] [address] [place], [place], [address] Phone: [phone]   Fax:  [phone]  Name: Kimberly Castillo MRN: [id number] Date of Birth: 04-10-1933

## 2018-09-25 NOTE — Patient Instructions (Addendum)
Download TalkPath Therapy on iPad or use laptop and go to [email or web address].  Log in: [email or web address] Password: [email or web address]  Cognitive activities: 30 minutes a day  "Name the category" : Give 3 items, have Keyonni tell you what category they belong to  Continue using spaced retrieval to help recall of important, short-term information.  Have stimulating conversation daily: play to strengths (long-term vs short term memory) Ask questions that are not "confrontational" (What's the name of XYZ?), but "conversational," ("Tell me about how you used to dance.")

## 2018-09-25 NOTE — Therapy (Signed)
[facility] [address] [place], [place], [address] Phone: [phone]   Fax:  [phone]  Speech Language Pathology Treatment  Patient Details  Name: Kimberly Castillo MRN: [id number] Date of Birth: November 11, 1933 Referring Provider (SLP): Claris Gower, NP   Encounter Date: 09/25/2018  End of Session - 09/25/18 1756    Visit Number  6    Number of Visits  9    Date for SLP Re-Evaluation  11/03/18    Authorization Type  [employer] Medicare prior auth required    SLP Start Time  1530    SLP Stop Time   1615    SLP Time Calculation (min)  45 min    Activity Tolerance  Patient tolerated treatment well       Past Medical History:  Diagnosis Date  . A-fib ([place])   . Anxiety state 12/22/2013  . Arthritis    hands, back  . Arthritis, lumbar spine 12/19/2012  . Cataracts, bilateral   . Chronic lower back pain 12/19/2012  . Diabetes mellitus ([place])   . Diverticulitis   . Dysrhythmia   . GERD (gastroesophageal reflux disease)   . Glaucoma   . Headache    hx migraines  . Heart murmur   . History of shingles   . Hyperlipidemia   . Hypertension   . Numbness    rt side of rt leg  . Post herpetic neuralgia 12/19/2012    Past Surgical History:  Procedure Laterality Date  . ABDOMINAL HYSTERECTOMY  1964   partial  . COLON RESECTION N/A 09/02/2013   Procedure: [facility] [place] colectomy;  Surgeon: Excell Seltzer, MD;  [facility];  Service: General;  Laterality: N/A;  . COLONOSCOPY    . COLOSTOMY TAKEDOWN N/A 11/09/2014   Procedure: TAKEDOWN [facility] [place] COLOSTOMY;  Surgeon: Excell Seltzer, MD;  [facility];  Service: General;  Laterality: N/A;  . [place]   left  . LOOP RECORDER INSERTION N/A 05/19/2018   Procedure: LOOP RECORDER INSERTION;  Surgeon: Evans Lance, MD;  Location: [facility];  Service: Cardiovascular;  Laterality: N/A;  . POLYPECTOMY      There were no vitals  filed for this visit.  Subjective Assessment - 09/25/18 1538    Currently in Pain?  No/denies   pain in right hand when exercising it           ADULT SLP TREATMENT - 09/25/18 1754      General Information   Behavior/Cognition  Alert;Distractible;Requires cueing      Treatment Provided   Treatment provided  Cognitive-Linquistic      Pain Assessment   Pain Assessment  No/denies pain      Cognitive-Linquistic Treatment   Treatment focused on  Cognition    Skilled Treatment  SLP demo'd how to assist pt with cognitive activities for home using TalkPath therapy account. Initial recall of letters from F:3 was 30%, so reduced task complexity to images from F:3 and accuracy was 90%, progressed to images from F:6 with accuracy 90%. Subsequently letter recall from F:3 80%, with verbal cues for attention to letter for recall. Son demo'd appropriate of cuing of pt in this task as SLP faded cues. Pt sustained attention to 8 minutes conversation today re: dancing and her hobbies in her youth. SLP provided education to son re: cuing pt conversationally vs "confrontationally," and asking open-ended questions.       Assessment / Recommendations / Plan   Plan  Discharge SLP treatment due to (comment)      Progression Toward Goals   Progression toward goals  Goals met, education completed, patient discharged from [facility] Education - 09/25/18 1754    Education Details  supporting pt in cognitive activities at home, how to assist pt in cognitive tasks, use of spaced retrieval and routines/procedural memory    Person(s) Educated  Patient;Child(ren)    Methods  Explanation;Demonstration    Comprehension  Verbalized understanding         SLP Long Term Goals - 09/25/18 1748      SLP LONG TERM GOAL #1   Title  Pt will demonstrate sustained attention for 6 minutes to a conversation of interest by asking follow-up questions or commenting appropriately x 2 sessions.    Baseline  09/23/18,  09/25/18    Time  2    Period  Weeks    Status  Achieved      SLP LONG TERM GOAL #2   Title  Family/caregivers will demonstrate understanding of at least 2 ways to aid pt's processing, comprehension, and recall of information or instructions x3 sessions.    Baseline  09-17-18, 09-23-18, 09-25-18    Time  2    Period  Weeks    Status  Achieved      SLP LONG TERM GOAL #3   Title  Family will report use of strategies such as use of routines/schedules and spaced retrieval to assist pt with functional recall or orientation between 3 sessions.    Baseline  09-12-18, 09-17-18    Time  3    Period  Weeks    Status  Partially Met       Plan - 09/25/18 1756    Clinical Impression Statement  Mrs. Labarre is a pleasant lady who cont to present with profound cognitive communication deficits, including recall, sustained attention, and awareness. SLP demo'd appropriate cognitive activities for home and how to assist pt (cuing hierarchy) with these. Education completed re: strategies to assist with functional recall including routines/schedules and spaced retrieval of salient information. Pt and son in agreement with d/c at this time.    Speech Therapy Frequency  --   d/c   Duration  --   d/c   Treatment/Interventions  Patient/family education;SLP instruction and feedback;Functional tasks;Cueing hierarchy;Internal/external aids;Compensatory techniques;Cognitive reorganization    Potential to Achieve Goals  Good    Potential Considerations  Severity of impairments;Ability to learn/carryover information    Consulted and Agree with Plan of Care  Patient;Family member/caregiver       Patient will benefit from skilled therapeutic intervention in order to improve the following deficits and impairments:   Cognitive communication deficit    Problem List Patient Active Problem List   Diagnosis Date Noted  . Acute CVA (cerebrovascular accident) ([place]) 05/17/2018  . CVA (cerebral vascular accident) ([place])  05/16/2018  . Noncompliance 05/16/2018  . Vitamin D deficiency 05/16/2018  . Abnormal EKG 05/16/2018  . Headache 05/16/2018  . Memory changes 05/13/2018  . Right knee pain 02/12/2017  . Spondylolisthesis, lumbar region 03/02/2016  . CKD (chronic kidney disease), stage III ([place]) 02/16/2016  . Chronic low back pain 07/12/2015  . Bilateral foot pain 01/26/2015  . Colostomy status ([place]) 11/09/2014  . Rash 06/24/2014  . Preventative health care 12/31/2013  . Anxiety state 12/22/2013  . Yeast vaginitis 12/22/2013  . Diverticulitis of colon with perforation s/p colectomy/ostomy 09/02/2013 09/02/2013  . Post herpetic neuralgia 12/19/2012  .  Arthritis, lumbar spine 12/19/2012  . Glaucoma   . A-fib ([place])   . GERD (gastroesophageal reflux disease) 10/06/2012  . DM type 2 (diabetes mellitus, type 2) ([place]) 09/22/2012  . Hypertension 09/22/2012  . Hyperlipemia 09/22/2012   SPEECH THERAPY DISCHARGE SUMMARY  Visits from Start of Care: 6  Current functional level related to goals / functional outcomes: Pt has met 2/3 LTGs and partially met 1 remaining LTG. Caregiver demonstrates and verbalizes understanding of how to assist pt with cognitive tasks and support functional recall at home. In agreement with d/c at this time.   Remaining deficits: Profound cognitive communication deficits persist including attention, memory, and intellectual awareness.   Education / Equipment: Cognitive activities for home, routines and schedules to support procedural memory, spaced retrieval Plan: Patient agrees to discharge.  Patient goals were partially met. Patient is being discharged due to being pleased with the current functional level.  ?????         Deneise Lever, [place], CCC-SLP Speech-Language Pathologist  Aliene Altes 09/25/2018, 5:58 PM  [facility] [address] [place], [place], [address] Phone: [phone]   Fax:   [phone]   Name: Kimberly Castillo MRN: [id number] Date of Birth: Nov 03, 1933

## 2018-09-29 ENCOUNTER — Ambulatory Visit (INDEPENDENT_AMBULATORY_CARE_PROVIDER_SITE_OTHER): Payer: Medicare HMO | Admitting: *Deleted

## 2018-09-29 DIAGNOSIS — I482 Chronic atrial fibrillation, unspecified: Secondary | ICD-10-CM

## 2018-09-29 DIAGNOSIS — I63432 Cerebral infarction due to embolism of left posterior cerebral artery: Secondary | ICD-10-CM

## 2018-09-30 ENCOUNTER — Ambulatory Visit: Payer: Medicare HMO | Admitting: Occupational Therapy

## 2018-09-30 ENCOUNTER — Ambulatory Visit: Payer: Medicare HMO | Admitting: Physical Therapy

## 2018-09-30 ENCOUNTER — Other Ambulatory Visit: Payer: Self-pay

## 2018-09-30 ENCOUNTER — Ambulatory Visit: Payer: Medicare HMO | Admitting: Speech Pathology

## 2018-09-30 ENCOUNTER — Encounter: Payer: Self-pay | Admitting: Occupational Therapy

## 2018-09-30 DIAGNOSIS — R2689 Other abnormalities of gait and mobility: Secondary | ICD-10-CM | POA: Diagnosis not present

## 2018-09-30 DIAGNOSIS — H53461 Homonymous bilateral field defects, right side: Secondary | ICD-10-CM | POA: Diagnosis not present

## 2018-09-30 DIAGNOSIS — R41842 Visuospatial deficit: Secondary | ICD-10-CM | POA: Diagnosis not present

## 2018-09-30 DIAGNOSIS — R2681 Unsteadiness on feet: Secondary | ICD-10-CM

## 2018-09-30 DIAGNOSIS — R41840 Attention and concentration deficit: Secondary | ICD-10-CM | POA: Diagnosis not present

## 2018-09-30 DIAGNOSIS — R278 Other lack of coordination: Secondary | ICD-10-CM | POA: Diagnosis not present

## 2018-09-30 DIAGNOSIS — M6281 Muscle weakness (generalized): Secondary | ICD-10-CM | POA: Diagnosis not present

## 2018-09-30 DIAGNOSIS — I69318 Other symptoms and signs involving cognitive functions following cerebral infarction: Secondary | ICD-10-CM | POA: Diagnosis not present

## 2018-09-30 DIAGNOSIS — R41841 Cognitive communication deficit: Secondary | ICD-10-CM | POA: Diagnosis not present

## 2018-09-30 LAB — CUP PACEART REMOTE DEVICE CHECK
Date Time Interrogation Session: 20200929131555
Implantable Pulse Generator Implant Date: 20200518

## 2018-09-30 NOTE — Therapy (Signed)
[facility] [address] [place], [place], [address] Phone: [phone]   Fax:  [phone]  Occupational Therapy Treatment  Patient Details  Name: Kimberly Castillo MRN: [id number] Date of Birth: June 12, 1933 Referring Provider (OT): Claris Gower, NP   Encounter Date: 09/30/2018  OT End of Session - 09/30/18 1120    Visit Number  6    Number of Visits  17    Date for OT Re-Evaluation  10/26/18    Authorization Type  [employer] Medicare prior auth required    Authorization Time Period  cert. 08/27/18-11/26/18; Services approved 08/27/18-02/27/19    Authorization - Visit Number  6    Authorization - Number of Visits  10    OT Start Time  1115   arrived late   OT Stop Time  1148    OT Time Calculation (min)  33 min    Activity Tolerance  Patient tolerated treatment well    Behavior During Therapy  Restless   confused      Past Medical History:  Diagnosis Date  . A-fib ([place])   . Anxiety state 12/22/2013  . Arthritis    hands, back  . Arthritis, lumbar spine 12/19/2012  . Cataracts, bilateral   . Chronic lower back pain 12/19/2012  . Diabetes mellitus ([place])   . Diverticulitis   . Dysrhythmia   . GERD (gastroesophageal reflux disease)   . Glaucoma   . Headache    hx migraines  . Heart murmur   . History of shingles   . Hyperlipidemia   . Hypertension   . Numbness    rt side of rt leg  . Post herpetic neuralgia 12/19/2012    Past Surgical History:  Procedure Laterality Date  . ABDOMINAL HYSTERECTOMY  1964   partial  . COLON RESECTION N/A 09/02/2013   Procedure: [facility] [place] colectomy;  Surgeon: Excell Seltzer, MD;  [facility];  Service: General;  Laterality: N/A;  . COLONOSCOPY    . COLOSTOMY TAKEDOWN N/A 11/09/2014   Procedure: TAKEDOWN [facility] [place] COLOSTOMY;  Surgeon: Excell Seltzer, MD;  [facility];  Service: General;  Laterality: N/A;  . [place]   left  . LOOP  RECORDER INSERTION N/A 05/19/2018   Procedure: LOOP RECORDER INSERTION;  Surgeon: Evans Lance, MD;  Location: [facility];  Service: Cardiovascular;  Laterality: N/A;  . POLYPECTOMY      There were no vitals filed for this visit.  Subjective Assessment - 09/30/18 1116    Subjective   I feel good when I'm sitting.    Patient Stated Goals  unsure, agreed with OT goals after discussion    Currently in Pain?  No/denies   when sitting   Pain Onset  More than a month ago        Reviewed coordination HEP.  Pt returned demo with min-mod prompts/cues for encouragement, to use R hand, and for technique.  Standing, folding clothes with min difficulty.  Able to stand 19min prior to rest break with min encouragement/prompts.  Sitting, functional reaching to place clothespins with 1-4lbs resistance on vertical pole for incr RUE activity tolerance and strength with 2-3 short rest breaks and min prompts.       OT Short Term Goals - 09/30/18 1154      OT SHORT TERM GOAL #1   Title  Pt will perform simple HEP for coordination and hand strength with min cueing.--check STGs 09/27/18    Time  4    Period  Weeks    Status  New      OT SHORT TERM GOAL #2   Title  Pt will perform simple tabletop visual scanning with at least 90% accuracy to assist with ADLs and ability to complete word searches.    Time  4    Period  Weeks    Status  New      OT SHORT TERM GOAL #3   Title  Pt will be able to attend to functional task for at least 8 min without cueing/redirection.    Time  4    Period  Weeks    Status  New      OT SHORT TERM GOAL #4   Title  Pt will perform simple home maintenance task in standing for at least 62min prior to rest break.    Time  4    Period  Weeks    Status  Achieved   09/30/18:  Able to stand and fold clothes for 5 min       OT Long Term Goals - 08/27/18 1947      OT LONG TERM GOAL #1   Title  Pt will verbalize understanding of visual compensations strategies  for ADLs/IADLs.    Time  8    Period  Weeks    Status  New      OT LONG TERM GOAL #2   Title  Pt perform simple environmental scanning/navigation with at least 90% accuracy for improved safety.    Time  8    Period  Weeks    Status  New      OT LONG TERM GOAL #3   Title  Pt will improve coordination/visual scanning for ADLs as shown by improving time on 9-hole peg test by at least 6sec with R hand.    Time  8    Period  Weeks    Status  New      OT LONG TERM GOAL #4   Title  Pt will improve R dominant hand strength by at least 8lbs to assist with ADLs/IADLs.    Time  8    Period  Weeks    Status  New      OT LONG TERM GOAL #5   Title  Pt will perform simple home maintenance task in standing for at least 10 min prior to rest break.    Time  8    Period  Weeks    Status  New            Plan - 09/30/18 1121    Clinical Impression Statement  Pt with improved visual scanning for familiar task (tabletop level).  Pt limited with RUE functional use by fatigue and needs prompts/redirection to participate and use RUE.  However, attention improved today.    OT Occupational Profile and History  Detailed Assessment- Review of Records and additional review of physical, cognitive, psychosocial history related to current functional performance    Occupational performance deficits (Please refer to evaluation for details):  ADL's;IADL's;Leisure;Social Participation    Body Structure / Function / Physical Skills  ADL;Dexterity;Vision;IADL;Balance;Endurance;Mobility;Strength;FMC;Coordination;Decreased knowledge of precautions    Cognitive Skills  Attention;Problem Solve;Safety Awareness;Thought;Memory;Understand    Rehab Potential  Good    Clinical Decision Making  Several treatment options, min-mod task modification necessary    Comorbidities Affecting Occupational Performance:  May have comorbidities impacting occupational performance    Modification or Assistance to Complete Evaluation    Min-Moderate modification of  tasks or assist with assess necessary to complete eval    OT Frequency  2x / week    OT Duration  8 weeks   +eval   OT Treatment/Interventions  Self-care/ADL training;Moist Heat;DME and/or AE instruction;Balance training;Therapeutic activities;Cognitive remediation/compensation;Therapeutic exercise;Neuromuscular education;Cryotherapy;Functional Mobility Training;Visual/perceptual remediation/compensation;Patient/family education;Manual Therapy;Energy conservation;Paraffin    Plan  continue with fuctional activities to incr participation and RUE functional use and caregiver education.    Recommended Other Services  outpatient PT    Consulted and Agree with Plan of Care  Patient;Family member/caregiver    Family Member Consulted  son       Patient will benefit from skilled therapeutic intervention in order to improve the following deficits and impairments:   Marketing executive / Function / Physical Skills: ADL, Dexterity, Vision, IADL, Balance, Endurance, Mobility, Strength, FMC, Coordination, Decreased knowledge of precautions Cognitive Skills: Attention, Problem Solve, Safety Awareness, Thought, Memory, Understand     Visit Diagnosis: Muscle weakness (generalized)  Visuospatial deficit  Homonymous bilateral field defects, right side  Other symptoms and signs involving cognitive functions following cerebral infarction  Other abnormalities of gait and mobility  Unsteadiness on feet  Attention and concentration deficit  Other lack of coordination    Problem List Patient Active Problem List   Diagnosis Date Noted  . Acute CVA (cerebrovascular accident) ([place]) 05/17/2018  . CVA (cerebral vascular accident) ([place]) 05/16/2018  . Noncompliance 05/16/2018  . Vitamin D deficiency 05/16/2018  . Abnormal EKG 05/16/2018  . Headache 05/16/2018  . Memory changes 05/13/2018  . Right knee pain 02/12/2017  . Spondylolisthesis, lumbar region 03/02/2016  . CKD  (chronic kidney disease), stage III ([place]) 02/16/2016  . Chronic low back pain 07/12/2015  . Bilateral foot pain 01/26/2015  . Colostomy status ([place]) 11/09/2014  . Rash 06/24/2014  . Preventative health care 12/31/2013  . Anxiety state 12/22/2013  . Yeast vaginitis 12/22/2013  . Diverticulitis of colon with perforation s/p colectomy/ostomy 09/02/2013 09/02/2013  . Post herpetic neuralgia 12/19/2012  . Arthritis, lumbar spine 12/19/2012  . Glaucoma   . A-fib ([place])   . GERD (gastroesophageal reflux disease) 10/06/2012  . DM type 2 (diabetes mellitus, type 2) ([place]) 09/22/2012  . Hypertension 09/22/2012  . Hyperlipemia 09/22/2012    [facility] 09/30/2018, 11:57 AM  [facility] [address] [place], [place], [address] Phone: [phone]   Fax:  [phone]  Name: Kimberly Castillo MRN: [id number] Date of Birth: June 06, 1933   Vianne Bulls, OTR/L [facility] [address] [place], [place]  [address] [phone] phone [phone] 09/30/18 11:59 AM

## 2018-10-02 ENCOUNTER — Ambulatory Visit: Payer: Medicare HMO | Admitting: Occupational Therapy

## 2018-10-02 ENCOUNTER — Ambulatory Visit: Payer: Medicare HMO | Admitting: Speech Pathology

## 2018-10-08 NOTE — Progress Notes (Signed)
Carelink Summary Report / Loop Recorder 

## 2018-10-10 ENCOUNTER — Other Ambulatory Visit: Payer: Self-pay | Admitting: Internal Medicine

## 2018-10-13 IMAGING — US US RENAL
1 series · 14 of 25 positions shown · non-contrast
Comparison: September 01, 2013

CLINICAL DATA: Stage III chronic renal disease

EXAM:
RENAL / URINARY TRACT ULTRASOUND COMPLETE

[Series 1: us renal · 0.23mm/px · 14 of 38 slices shown]
[im 1/38]
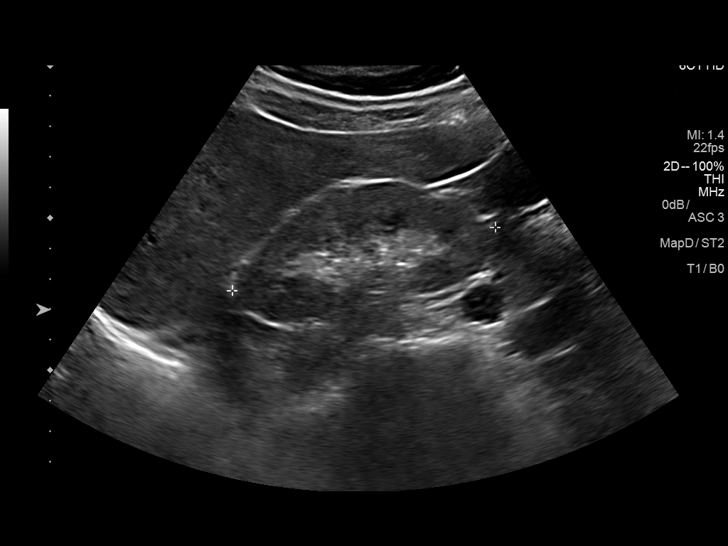
[im 4/38]
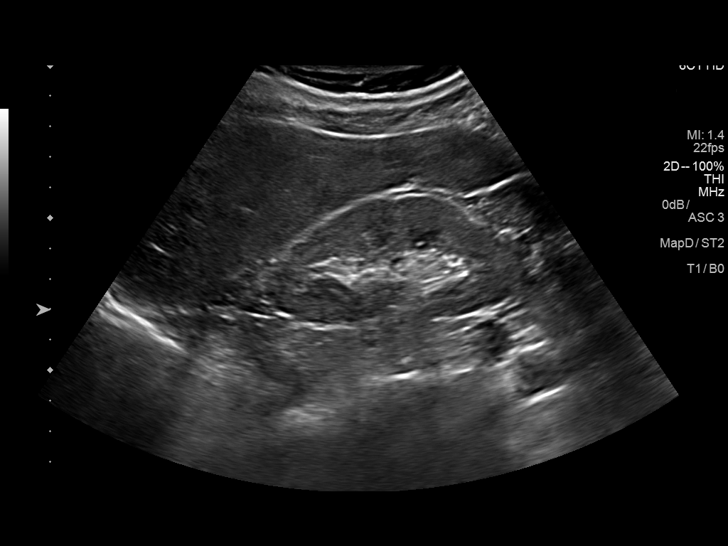
[im 7/38]
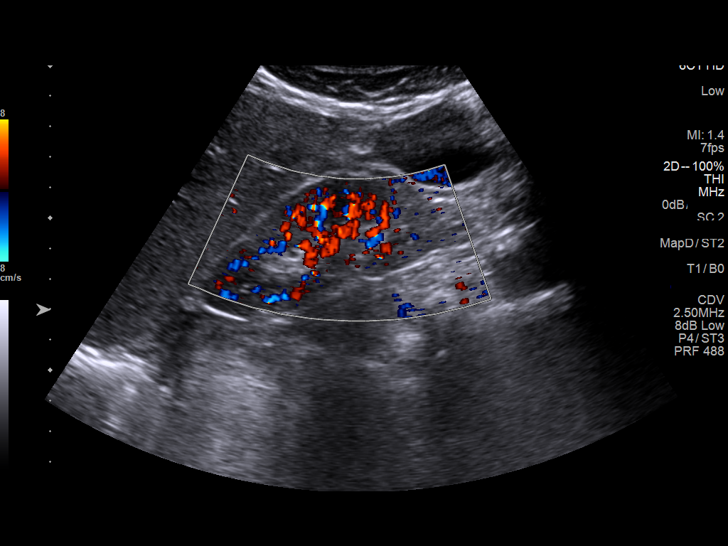
[im 10/38]
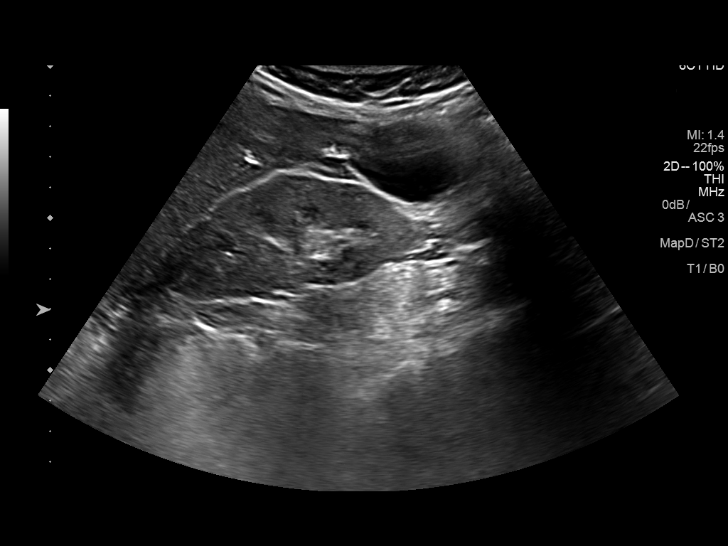
[im 13/38]
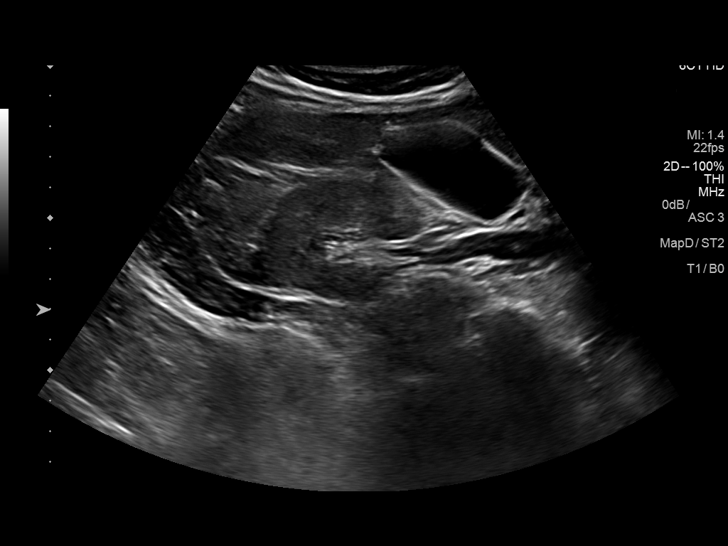
[im 14/38]
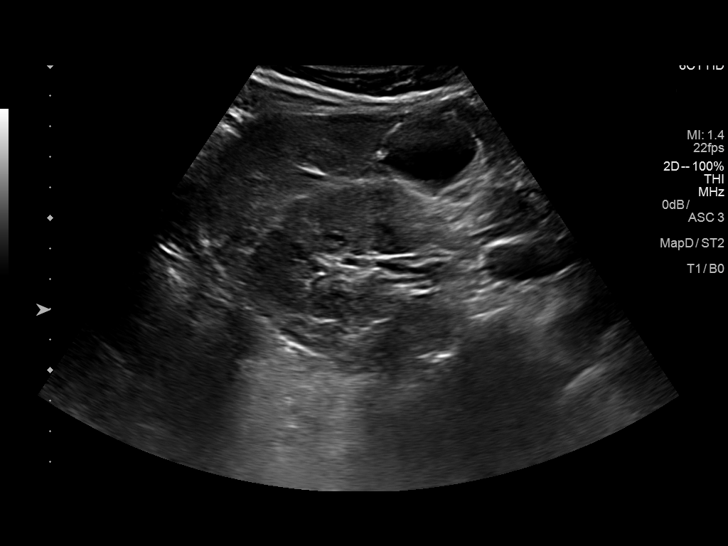
[im 17/38]
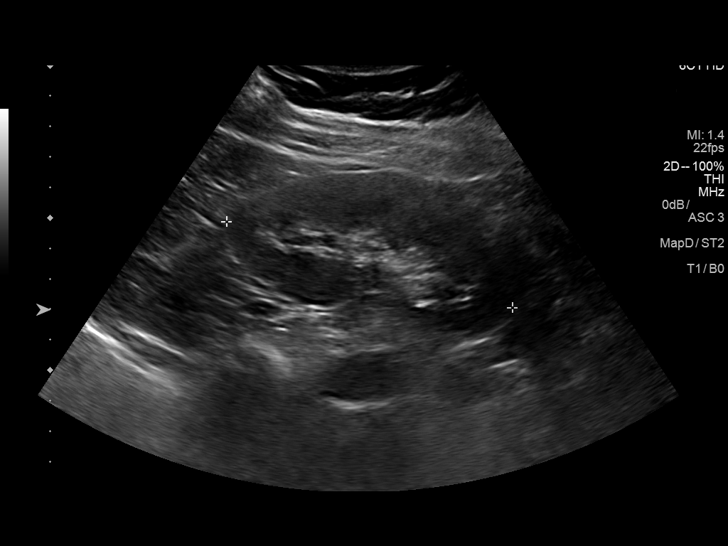
[im 21/38]
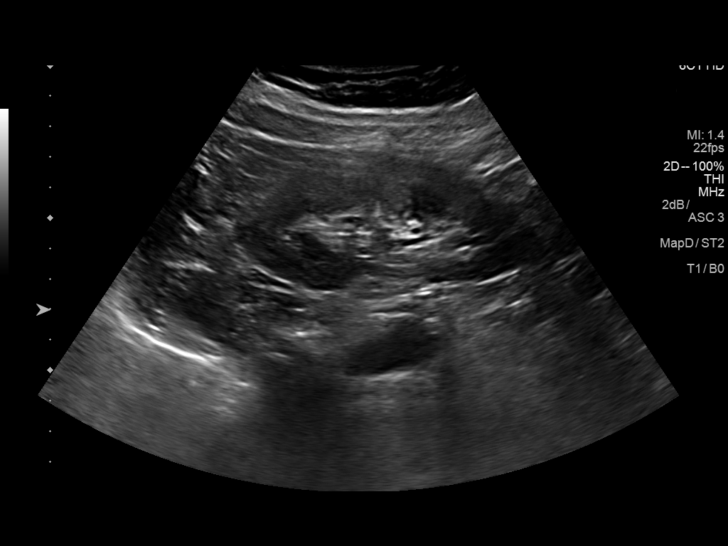
[im 24/38]
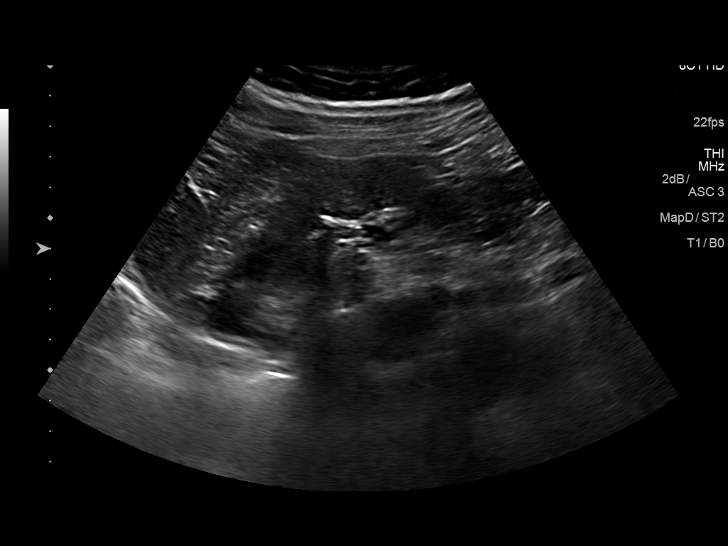
[im 25/38]
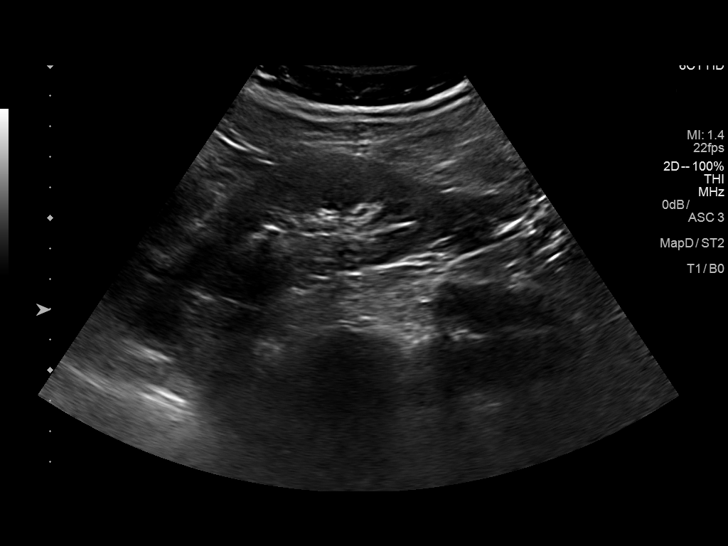
[im 28/38]
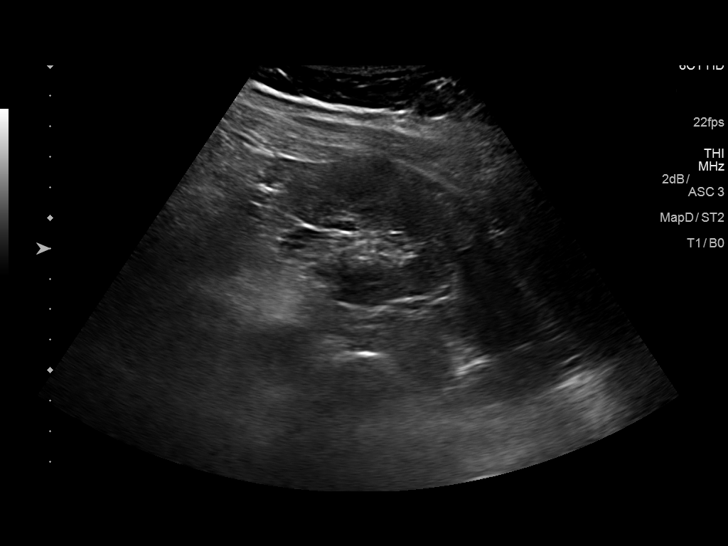
[im 31/38]
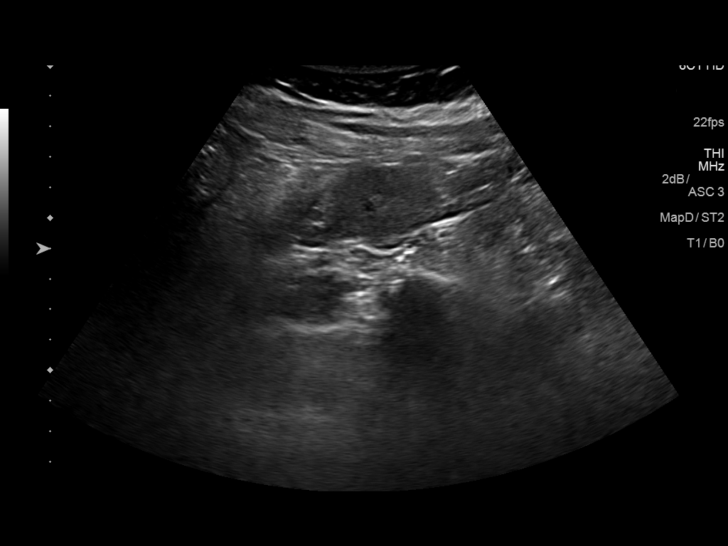
[im 34/38]
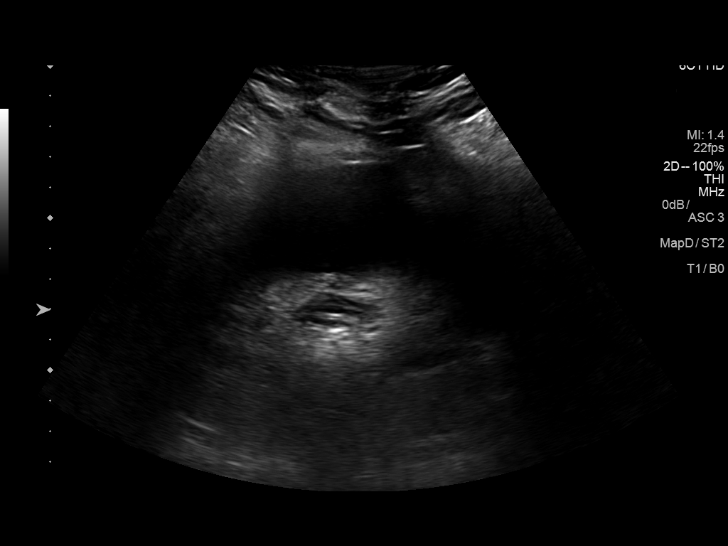
[im 38/38]
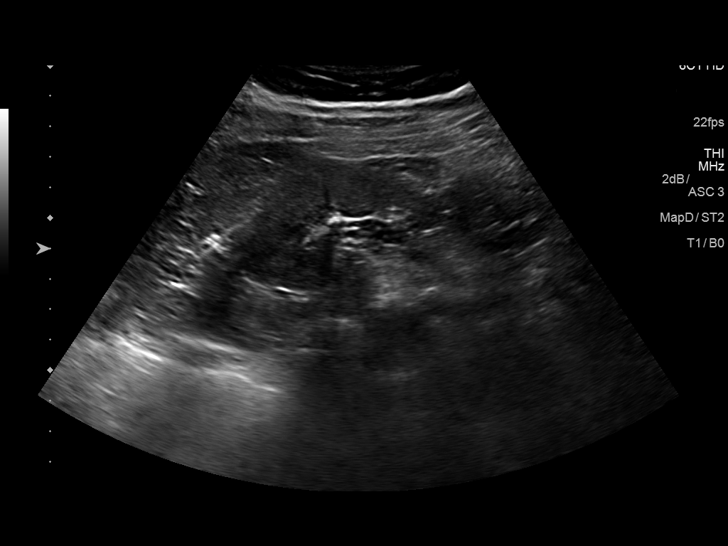

[14 of 25 positions shown; findings below may reference images not displayed]

FINDINGS: Right Kidney:

Length: 8.9 cm. Echogenicity and renal cortical thickness within
normal limits. No mass, perinephric fluid, or hydronephrosis
visualized. No sonographically demonstrable calculus or
ureterectasis.

Left Kidney:

Length: 9.8 cm. Echogenicity and renal cortical thickness are within
normal limits. No mass, perinephric fluid, or hydronephrosis
visualized. No sonographically demonstrable calculus or
ureterectasis.

Bladder:

Appears normal for degree of bladder distention.
IMPRESSION: Kidneys rather small, particular on the right. This finding may be
seen with major with chronic medical renal disease. Note that renal
cortical thickness and echogenicity are normal bilaterally. No
obstructing foci in either kidney.

## 2018-10-14 ENCOUNTER — Ambulatory Visit: Payer: Medicare HMO | Admitting: Occupational Therapy

## 2018-10-14 ENCOUNTER — Encounter: Payer: Medicare HMO | Admitting: Speech Pathology

## 2018-10-14 ENCOUNTER — Ambulatory Visit: Payer: Medicare HMO | Admitting: Physical Therapy

## 2018-10-14 ENCOUNTER — Other Ambulatory Visit: Payer: Self-pay | Admitting: Internal Medicine

## 2018-10-16 ENCOUNTER — Ambulatory Visit: Payer: Medicare HMO | Admitting: Occupational Therapy

## 2018-10-16 ENCOUNTER — Encounter: Payer: Medicare HMO | Admitting: Speech Pathology

## 2018-10-20 ENCOUNTER — Emergency Department (HOSPITAL_COMMUNITY): Payer: Medicare HMO

## 2018-10-20 ENCOUNTER — Other Ambulatory Visit: Payer: Self-pay

## 2018-10-20 ENCOUNTER — Encounter (HOSPITAL_COMMUNITY): Payer: Self-pay

## 2018-10-20 ENCOUNTER — Emergency Department (HOSPITAL_COMMUNITY)
Admission: EM | Admit: 2018-10-20 | Discharge: 2018-10-20 | Disposition: A | Discharge status: Home / Self Care | Payer: Medicare HMO | Attending: Emergency Medicine | Admitting: Emergency Medicine

## 2018-10-20 ENCOUNTER — Ambulatory Visit: Payer: Self-pay

## 2018-10-20 ENCOUNTER — Ambulatory Visit (HOSPITAL_COMMUNITY)
Admission: EM | Admit: 2018-10-20 | Discharge: 2018-10-20 | Disposition: A | Discharge status: Home / Self Care | Payer: Medicare HMO | Source: Home / Self Care

## 2018-10-20 DIAGNOSIS — Z79899 Other long term (current) drug therapy: Secondary | ICD-10-CM | POA: Diagnosis not present

## 2018-10-20 DIAGNOSIS — E1122 Type 2 diabetes mellitus with diabetic chronic kidney disease: Secondary | ICD-10-CM | POA: Diagnosis not present

## 2018-10-20 DIAGNOSIS — Z8673 Personal history of transient ischemic attack (TIA), and cerebral infarction without residual deficits: Secondary | ICD-10-CM | POA: Diagnosis not present

## 2018-10-20 DIAGNOSIS — R42 Dizziness and giddiness: Secondary | ICD-10-CM | POA: Diagnosis not present

## 2018-10-20 DIAGNOSIS — R270 Ataxia, unspecified: Secondary | ICD-10-CM | POA: Diagnosis not present

## 2018-10-20 DIAGNOSIS — Z7901 Long term (current) use of anticoagulants: Secondary | ICD-10-CM | POA: Insufficient documentation

## 2018-10-20 DIAGNOSIS — R0602 Shortness of breath: Secondary | ICD-10-CM | POA: Diagnosis not present

## 2018-10-20 DIAGNOSIS — I129 Hypertensive chronic kidney disease with stage 1 through stage 4 chronic kidney disease, or unspecified chronic kidney disease: Secondary | ICD-10-CM | POA: Insufficient documentation

## 2018-10-20 DIAGNOSIS — I10 Essential (primary) hypertension: Secondary | ICD-10-CM | POA: Diagnosis not present

## 2018-10-20 DIAGNOSIS — Z7984 Long term (current) use of oral hypoglycemic drugs: Secondary | ICD-10-CM | POA: Insufficient documentation

## 2018-10-20 DIAGNOSIS — Z87891 Personal history of nicotine dependence: Secondary | ICD-10-CM | POA: Diagnosis not present

## 2018-10-20 DIAGNOSIS — N183 Chronic kidney disease, stage 3 unspecified: Secondary | ICD-10-CM | POA: Diagnosis not present

## 2018-10-20 HISTORY — DX: Cerebral infarction, unspecified: I63.9

## 2018-10-20 LAB — BASIC METABOLIC PANEL
Anion gap: 10 (ref 5–15)
BUN: 30 mg/dL — ABNORMAL HIGH (ref 8–23)
CO2: 23 mmol/L (ref 22–32)
Calcium: 9.7 mg/dL (ref 8.9–10.3)
Chloride: 108 mmol/L (ref 98–111)
Creatinine, Ser: 1.83 mg/dL — ABNORMAL HIGH (ref 0.44–1.00)
GFR calc Af Amer: 29 mL/min — ABNORMAL LOW (ref >60)
GFR calc non Af Amer: 25 mL/min — ABNORMAL LOW (ref >60)
Glucose, Bld: 108 mg/dL — ABNORMAL HIGH (ref 70–99)
Potassium: 3.7 mmol/L (ref 3.5–5.1)
Sodium: 141 mmol/L (ref 135–145)

## 2018-10-20 LAB — CBC
HCT: 33.9 % — ABNORMAL LOW (ref 36.0–46.0)
Hemoglobin: 11 g/dL — ABNORMAL LOW (ref 12.0–15.0)
MCH: 31.9 pg (ref 26.0–34.0)
MCHC: 32.4 g/dL (ref 30.0–36.0)
MCV: 98.3 fL (ref 80.0–100.0)
Platelets: 242 10*3/uL (ref 150–400)
RBC: 3.45 MIL/uL — ABNORMAL LOW (ref 3.87–5.11)
RDW: 13.4 % (ref 11.5–15.5)
WBC: 6.2 10*3/uL (ref 4.0–10.5)
nRBC: 0 % (ref 0.0–0.2)

## 2018-10-20 LAB — CBG MONITORING, ED: Glucose-Capillary: 92 mg/dL (ref 70–99)

## 2018-10-20 MED ORDER — SODIUM CHLORIDE 0.9% FLUSH: 3.0000 mL | Freq: Once | INTRAVENOUS | Status: DC

## 2018-10-20 MED ORDER — MECLIZINE HCL 25 MG PO TABS
25.0000 mg | ORAL_TABLET | Freq: Once | ORAL | Status: AC
  Administered 2018-10-20: 21:00:00 25 mg via ORAL
  Filled 2018-10-20: qty 1

## 2018-10-20 MED ORDER — MECLIZINE HCL 12.5 MG PO TABS: 12.5000 mg | ORAL_TABLET | Freq: Three times a day (TID) | ORAL | 0 refills | Status: DC | PRN

## 2018-10-20 NOTE — ED Provider Notes (Signed)
[facility] EMERGENCY DEPARTMENT Provider Note   CSN: [id number] Arrival date & time: 10/20/18  1638     History   Chief Complaint Chief Complaint  Patient presents with  . Dizziness  . Hypertension    HPI Kimberly Castillo is a 85 y.o. female.  Presents to ER with dizziness, hypertension.  Patient states that checked her blood pressure at home and was noted to be elevated to greater than 466 systolic.  Per recommendation from her primary care office she went to urgent care for evaluation.  Had not taken her blood pressure medicine yet this morning.  Took her blood pressure medicine and went to urgent care.  At urgent care they recommended patient go to ER for further evaluation.  At time of my initial assessment, patient states she is having no symptoms, specifically denies any ongoing dizziness, no associated chest pain, difficulty breathing, numbness, weakness, vision changes.  Patient states dizziness occurs whenever she tries stand up, states has had difficulty walking today due to her dizziness.       HPI  Past Medical History:  Diagnosis Date  . A-fib ([place])   . Anxiety state 12/22/2013  . Arthritis    hands, back  . Arthritis, lumbar spine 12/19/2012  . Cataracts, bilateral   . Chronic lower back pain 12/19/2012  . Diabetes mellitus ([place])   . Diverticulitis   . Dysrhythmia   . GERD (gastroesophageal reflux disease)   . Glaucoma   . Headache    hx migraines  . Heart murmur   . History of shingles   . Hyperlipidemia   . Hypertension   . Numbness    rt side of rt leg  . Post herpetic neuralgia 12/19/2012  . Stroke [facility])    may 2020    Patient Active Problem List   Diagnosis Date Noted  . Acute CVA (cerebrovascular accident) ([place]) 05/17/2018  . CVA (cerebral vascular accident) ([place]) 05/16/2018  . Noncompliance 05/16/2018  . Vitamin D deficiency 05/16/2018  . Abnormal EKG 05/16/2018  . Headache 05/16/2018  . Memory changes 05/13/2018  .  Right knee pain 02/12/2017  . Spondylolisthesis, lumbar region 03/02/2016  . CKD (chronic kidney disease), stage III ([place]) 02/16/2016  . Chronic low back pain 07/12/2015  . Bilateral foot pain 01/26/2015  . Colostomy status ([place]) 11/09/2014  . Rash 06/24/2014  . Preventative health care 12/31/2013  . Anxiety state 12/22/2013  . Yeast vaginitis 12/22/2013  . Diverticulitis of colon with perforation s/p colectomy/ostomy 09/02/2013 09/02/2013  . Post herpetic neuralgia 12/19/2012  . Arthritis, lumbar spine 12/19/2012  . Glaucoma   . A-fib ([place])   . GERD (gastroesophageal reflux disease) 10/06/2012  . DM type 2 (diabetes mellitus, type 2) ([place]) 09/22/2012  . Hypertension 09/22/2012  . Hyperlipemia 09/22/2012    Past Surgical History:  Procedure Laterality Date  . ABDOMINAL HYSTERECTOMY  1964   partial  . COLON RESECTION N/A 09/02/2013   Procedure: [place] colectomy;  Surgeon: Excell Seltzer, MD;  Location: [facility];  Service: General;  Laterality: N/A;  . COLONOSCOPY    . COLOSTOMY TAKEDOWN N/A 11/09/2014   Procedure: TAKEDOWN [place] COLOSTOMY;  Surgeon: Excell Seltzer, MD;  Location: [facility];  Service: General;  Laterality: N/A;  . [place]   left  . LOOP RECORDER INSERTION N/A 05/19/2018   Procedure: LOOP RECORDER INSERTION;  Surgeon: Evans Lance, MD;  Location: [place] CV LAB;  Service: Cardiovascular;  Laterality: N/A;  .  POLYPECTOMY       OB History   No obstetric history on file.      Home Medications    Prior to Admission medications   Medication Sig Start Date End Date Taking? Authorizing Provider  apixaban (ELIQUIS) 2.5 MG TABS tablet Take 1 tablet (2.5 mg total) by mouth 2 (two) times daily. 07/07/18  Yes Shirley Friar, PA-C  atorvastatin (LIPITOR) 80 MG tablet Take 1 tablet (80 mg total) by mouth daily at 6 PM. 06/23/18  Yes McCue, Janett Billow, NP  blood glucose meter kit and supplies KIT Use up to four times daily as  directed. E11.9 05/20/18  Yes Biagio Borg, MD  diltiazem (CARDIZEM) 120 MG tablet Take 1 tablet (120 mg total) by mouth daily. For heart and blood pressure 05/13/18  Yes Biagio Borg, MD  Lancets MISC Use as directed once daily 05/13/18  Yes Biagio Borg, MD  metFORMIN (GLUCOPHAGE) 500 MG tablet Take 1 tablet (500 mg total) by mouth 2 (two) times daily with a meal. For diabetes 05/13/18  Yes Biagio Borg, MD  metoprolol tartrate (LOPRESSOR) 25 MG tablet Take 1 tablet (25 mg total) by mouth 2 (two) times daily. 05/19/18  Yes Gherghe, Vella Redhead, MD  pantoprazole (PROTONIX) 40 MG tablet Take 1 tablet (40 mg total) by mouth daily. For reflux 05/30/18  Yes Biagio Borg, MD  tiZANidine (ZANAFLEX) 4 MG tablet TAKE 1 TABLET BY MOUTH EVERY 6 HOURS AS NEEDED FOR MUSCLE SPASM Patient taking differently: Take 4 mg by mouth every 6 (six) hours as needed for muscle spasms.  10/15/18  Yes Biagio Borg, MD  Vitamin D, Ergocalciferol, (DRISDOL) 1.25 MG (50000 UT) CAPS capsule Take 1 capsule (50,000 Units total) by mouth every 7 (seven) days. 07/03/18  Yes Biagio Borg, MD  Blood Glucose Monitoring Suppl (ONE TOUCH ULTRA 2) w/Device KIT USE as directed 05/13/18   Biagio Borg, MD    Family History Family History  Problem Relation Age of Onset  . Hypertension Mother   . Hypertension Maternal Grandmother   . Colon cancer Neg Hx   . Stomach cancer Neg Hx   . Colon polyps Neg Hx   . Esophageal cancer Neg Hx   . Rectal cancer Neg Hx     Social History Social History   Tobacco Use  . Smoking status: Former Smoker    Types: Cigarettes    Start date: 09/17/1991    Quit date: 10/31/1997    Years since quitting: 20.9  . Smokeless tobacco: Never Used  Substance Use Topics  . Alcohol use: No  . Drug use: No     Allergies   Patient has no known allergies.   Review of Systems Review of Systems  Constitutional: Negative for chills and fever.  HENT: Negative for ear pain and sore throat.   Eyes: Negative  for pain and visual disturbance.  Respiratory: Negative for cough and shortness of breath.   Cardiovascular: Negative for chest pain and palpitations.  Gastrointestinal: Negative for abdominal pain and vomiting.  Genitourinary: Negative for dysuria and hematuria.  Musculoskeletal: Negative for arthralgias and back pain.  Skin: Negative for color change and rash.  Neurological: Positive for dizziness. Negative for seizures and syncope.  All other systems reviewed and are negative.    Physical Exam Updated Vital Signs BP 137/77   Pulse 62   Temp 99.1 F (37.3 C) (Oral)   Resp 15   Ht '5\' 2"'$  (1.575 m)  Wt 68 kg   SpO2 100%   BMI 27.44 kg/m   Physical Exam Vitals signs and nursing note reviewed.  Constitutional:      General: She is not in acute distress.    Appearance: She is well-developed.  HENT:     Head: Normocephalic and atraumatic.  Eyes:     Conjunctiva/sclera: Conjunctivae normal.  Neck:     Musculoskeletal: Neck supple.  Cardiovascular:     Rate and Rhythm: Normal rate and regular rhythm.     Heart sounds: No murmur.  Pulmonary:     Effort: Pulmonary effort is normal. No respiratory distress.     Breath sounds: Normal breath sounds.  Abdominal:     Palpations: Abdomen is soft.     Tenderness: There is no abdominal tenderness.  Skin:    General: Skin is warm and dry.  Neurological:     Mental Status: She is alert.     Comments: Alert and x3, cranial nerves II through XII intact, 5 out of 5 strength in bilateral upper and lower extremities, normal finger-nose-finger, normal pronator drift, mildly unsteady gait but no frank ataxia, able to walk without assistance in room      ED Treatments / Results  Labs (all labs ordered are listed, but only abnormal results are displayed) Labs Reviewed  BASIC METABOLIC PANEL - Abnormal; Notable for the following components:      Result Value   Glucose, Bld 108 (*)    BUN 30 (*)    Creatinine, Ser 1.83 (*)    GFR  calc non Af [name] 25 (*)    GFR calc Af [name] 29 (*)    All other components within normal limits  CBC - Abnormal; Notable for the following components:   RBC 3.45 (*)    Hemoglobin 11.0 (*)    HCT 33.9 (*)    All other components within normal limits  URINALYSIS, ROUTINE W REFLEX MICROSCOPIC  CBG MONITORING, ED    EKG EKG Interpretation  Date/Time:  Monday October 20 2018 17:00:15 EDT Ventricular Rate:  110 PR Interval:  194 QRS Duration: 94 QT Interval:  326 QTC Calculation: 441 R Axis:   -69 Text Interpretation:  Sinus tachycardia with frequent Premature ventricular complexes Left axis deviation Left ventricular hypertrophy with repolarization abnormality ( R in aVL , Cornell product ) Anteroseptal infarct , age undetermined Abnormal ECG Confirmed by Madalyn Rob [phone]) on 10/20/2018 6:54:21 PM   Radiology Dg Chest 2 View  Result Date: 10/20/2018 CLINICAL DATA:  Shortness of breath EXAM: CHEST - 2 VIEW COMPARISON:  05/15/2018 FINDINGS: Cardiomegaly. Aortic atherosclerosis. Implantable loop recorder. Both lungs are clear. The visualized skeletal structures are unremarkable. IMPRESSION: Cardiomegaly without acute abnormality of the lungs. Electronically Signed   By: Eddie Candle M.D.   On: 10/20/2018 19:56   Mr Brain Wo Contrast  Result Date: 10/20/2018 CLINICAL DATA:  New onset vertigo ataxia EXAM: MRI HEAD WITHOUT CONTRAST TECHNIQUE: Multiplanar, multiecho pulse sequences of the brain and surrounding structures were obtained without intravenous contrast. COMPARISON:  MRI head 05/16/2018 FINDINGS: Brain: Chronic infarct left occipital lobe with mild chronic hemorrhage. Chronic microvascular ischemic change throughout the white matter and pons similar to the prior study. Negative for acute infarct. Negative for mass or edema. Generalized atrophy with mild ventricular enlargement which is stable from the prior study. Vascular: Normal arterial flow voids. Skull and upper cervical  spine: Negative Sinuses/Orbits: Mucosal edema paranasal sinuses most prominent right maxillary sinus. Normal orbit. Other: None IMPRESSION:  No acute intracranial abnormality Atrophy with chronic microvascular ischemic change in the white matter. Chronic infarct left occipital lobe. Electronically Signed   By: Franchot Gallo M.D.   On: 10/20/2018 21:22    Procedures Procedures (including critical care time)  Medications Ordered in ED Medications  sodium chloride flush (NS) 0.9 % injection 3 mL (has no administration in time range)  meclizine (ANTIVERT) tablet 25 mg (has no administration in time range)     Initial Impression / Assessment and Plan / ED Course  I have reviewed the triage vital signs and the nursing notes.  Pertinent labs & imaging results that were available during my care of the patient were reviewed by me and considered in my medical decision making (see chart for details).  Clinical Course as of Oct 20 2346  Mon Oct 20, 2018  8206 Complete initial assessment, patient asymptomatic currently, concern for new onset vertigo, will check MRI to rule out central etiology, blood pressure stable at this time   [RD]  2135 Recheck patient updated on results, MRI without new infarct, did demonstrate old infarct that patient was already aware of.  Currently asymptomatic, BP stable, will discharge home with Rx for Antivert   [RD]    Clinical Course User Index [RD] Lucrezia Starch, MD       85 year old lady presents to ER with high blood pressure, dizziness.  At time of my initial evaluation patient was well-appearing with normal vital signs, normal blood pressure, normal neurologic exam.  Labs wnl, ekg without acute changes, no events on tele monitor. Given reported episodes of vertigo and patient's extensive history of prior CVA, check MRI.  This was negative for acute infarct, did demonstrate prior stroke.  Provided single dose of meclizine.  Will discharge home with plan for  follow-up with primary doctor.      After the discussed management above, the patient was determined to be safe for discharge.  The patient was in agreement with this plan and all questions regarding their care were answered.  ED return precautions were discussed and the patient will return to the ED with any significant worsening of condition.    Final Clinical Impressions(s) / ED Diagnoses   Final diagnoses:  Vertigo  Hypertension, unspecified type    ED Discharge Orders    None       Lucrezia Starch, MD 10/20/18 2350

## 2018-10-20 NOTE — Discharge Instructions (Signed)
Recommend schedule follow-up appointment with your primary doctor to discuss ongoing management of your high blood pressure.  Recommend using the prescribed i Antivert for help with dizziness as needed.  IF you develop chest pain, difficulty breathing, vision changes, numbness, weakness or other new concerning symptom recommend returning to ER for recheck.

## 2018-10-20 NOTE — ED Triage Notes (Signed)
Pt. States her blood pressure this morning was 204/132 206/125, she called her doctor they told her to take her blood pressure mediation and go to the UC or ED. Pt states she took diltiazem 120 mg 2 hours ago. Pt report headache.

## 2018-10-20 NOTE — Telephone Encounter (Signed)
Incoming call from Patient son who states that he took his Mothers blood pressur receid the value of 204/132 and 206/125, has missed diltiazem.  Just recently took it.  Reviewed protocol with the Pt. Son.  Recommend that Pt go to either Ed or Urgent Care. Son voices understanding.          Reason for Disposition . [7] Systolic BP  >= 615 OR Diastolic >= 183 AND [4] cardiac or neurologic symptoms (e.g., chest pain, difficulty breathing, unsteady gait, blurred vision)  Answer Assessment - Initial Assessment Questions 1. BLOOD PRESSURE: "What is the blood pressure?" "Did you take at least two measurements 5 minutes apart?"     204/132  206/125 2. ONSET: "When did you take your blood pressure?"     today 3. HOW: "How did you obtain the blood pressure?" (e.g., visiting nurse, automatic home BP monitor)    automatic 4. HISTORY: "Do you have a history of high blood pressure?"    yes 5. MEDICATIONS: "Are you taking any medications for blood pressure?" "Have you missed any doses recently?"     Yes.   6. OTHER SYMPTOMS: "Do you have any symptoms?" (e.g., headache, chest pain, blurred vision, difficulty breathing, weakness)     Light head  7. PREGNANCY: "Is there any chance you are pregnant?" "When was your last menstrual period?"     na  Protocols used: HIGH BLOOD PRESSURE-A-AH

## 2018-10-20 NOTE — ED Notes (Signed)
Per Dr. Lanny Cramp pt discharged to the ED.

## 2018-10-20 NOTE — ED Triage Notes (Signed)
[facility] for evaluation of htn, dizziness and headache that has now resolved. Pt accompanied by her son who states she also c.o SOB while at [facility]. Pt now c.o burning in her chest during triage. Pt alert, nad noted.

## 2018-10-20 NOTE — Telephone Encounter (Signed)
Noted  

## 2018-10-20 NOTE — ED Notes (Signed)
Patient denies pain and is resting comfortably.  

## 2018-10-29 ENCOUNTER — Ambulatory Visit: Payer: Medicare HMO | Admitting: Adult Health

## 2018-11-02 LAB — CUP PACEART REMOTE DEVICE CHECK
Date Time Interrogation Session: 20201101131322
Implantable Pulse Generator Implant Date: 20200518

## 2018-11-03 ENCOUNTER — Ambulatory Visit (INDEPENDENT_AMBULATORY_CARE_PROVIDER_SITE_OTHER): Payer: Medicare HMO | Admitting: *Deleted

## 2018-11-03 DIAGNOSIS — I482 Chronic atrial fibrillation, unspecified: Secondary | ICD-10-CM

## 2018-11-03 DIAGNOSIS — I639 Cerebral infarction, unspecified: Secondary | ICD-10-CM | POA: Diagnosis not present

## 2018-11-25 NOTE — Progress Notes (Signed)
Carelink Summary Report / Loop Recorder 

## 2018-12-05 ENCOUNTER — Ambulatory Visit (INDEPENDENT_AMBULATORY_CARE_PROVIDER_SITE_OTHER): Payer: Medicare HMO | Admitting: *Deleted

## 2018-12-05 DIAGNOSIS — I639 Cerebral infarction, unspecified: Secondary | ICD-10-CM

## 2018-12-05 DIAGNOSIS — H25033 Anterior subcapsular polar age-related cataract, bilateral: Secondary | ICD-10-CM | POA: Diagnosis not present

## 2018-12-05 DIAGNOSIS — Z0100 Encounter for examination of eyes and vision without abnormal findings: Secondary | ICD-10-CM | POA: Diagnosis not present

## 2018-12-06 LAB — CUP PACEART REMOTE DEVICE CHECK
Date Time Interrogation Session: 20201204102617
Implantable Pulse Generator Implant Date: 20200518

## 2018-12-19 ENCOUNTER — Other Ambulatory Visit: Payer: Self-pay | Admitting: Internal Medicine

## 2019-01-05 ENCOUNTER — Telehealth: Payer: Self-pay | Admitting: Adult Health

## 2019-01-05 NOTE — Telephone Encounter (Signed)
Son Lorenzo Pereyra on [place] called stating that he is needing a letter of exemption for the pt. She received a [employer] and son states she is not competent enough to serve. Please advise.

## 2019-01-05 NOTE — Telephone Encounter (Signed)
Either myself or her PCP can help assist with jury duty exemption due to residual stroke deficits.  He may want to reach out to his PCP 1st as they may not charge a fee to assist with this paperwork.  No follow-up visit needs to be scheduled if he does wish for our office to complete paperwork

## 2019-01-07 ENCOUNTER — Ambulatory Visit (INDEPENDENT_AMBULATORY_CARE_PROVIDER_SITE_OTHER): Payer: Medicare HMO | Admitting: *Deleted

## 2019-01-07 DIAGNOSIS — I639 Cerebral infarction, unspecified: Secondary | ICD-10-CM

## 2019-01-08 LAB — CUP PACEART REMOTE DEVICE CHECK
Date Time Interrogation Session: 20210106102235
Implantable Pulse Generator Implant Date: 20200518

## 2019-01-09 ENCOUNTER — Ambulatory Visit: Payer: Medicare HMO | Admitting: Internal Medicine

## 2019-01-13 ENCOUNTER — Ambulatory Visit (INDEPENDENT_AMBULATORY_CARE_PROVIDER_SITE_OTHER): Payer: Medicare HMO | Admitting: Internal Medicine

## 2019-01-13 ENCOUNTER — Encounter: Payer: Self-pay | Admitting: Internal Medicine

## 2019-01-13 ENCOUNTER — Other Ambulatory Visit: Payer: Self-pay

## 2019-01-13 VITALS — BP 180/102 | HR 84 | Temp 98.8°F | Resp 14 | Ht 62.0 in

## 2019-01-13 DIAGNOSIS — Z0000 Encounter for general adult medical examination without abnormal findings: Secondary | ICD-10-CM

## 2019-01-13 DIAGNOSIS — I10 Essential (primary) hypertension: Secondary | ICD-10-CM

## 2019-01-13 DIAGNOSIS — N183 Chronic kidney disease, stage 3 unspecified: Secondary | ICD-10-CM | POA: Diagnosis not present

## 2019-01-13 DIAGNOSIS — E559 Vitamin D deficiency, unspecified: Secondary | ICD-10-CM

## 2019-01-13 DIAGNOSIS — E1159 Type 2 diabetes mellitus with other circulatory complications: Secondary | ICD-10-CM | POA: Diagnosis not present

## 2019-01-13 LAB — BASIC METABOLIC PANEL
BUN: 40 mg/dL — ABNORMAL HIGH (ref 6–23)
CO2: 25 mEq/L (ref 19–32)
Calcium: 9.6 mg/dL (ref 8.4–10.5)
Chloride: 106 mEq/L (ref 96–112)
Creatinine, Ser: 2.09 mg/dL — ABNORMAL HIGH (ref 0.40–1.20)
GFR: 27.3 mL/min — ABNORMAL LOW (ref >60.00)
Glucose, Bld: 107 mg/dL — ABNORMAL HIGH (ref 70–99)
Potassium: 3.4 mEq/L — ABNORMAL LOW (ref 3.5–5.1)
Sodium: 140 mEq/L (ref 135–145)

## 2019-01-13 LAB — CBC WITH DIFFERENTIAL/PLATELET
Basophils Absolute: 0 10*3/uL (ref 0.0–0.1)
Basophils Relative: 0.5 % (ref 0.0–3.0)
Eosinophils Absolute: 0.1 10*3/uL (ref 0.0–0.7)
Eosinophils Relative: 1.4 % (ref 0.0–5.0)
HCT: 32.9 % — ABNORMAL LOW (ref 36.0–46.0)
Hemoglobin: 10.8 g/dL — ABNORMAL LOW (ref 12.0–15.0)
Lymphocytes Relative: 36.2 % (ref 12.0–46.0)
Lymphs Abs: 2 10*3/uL (ref 0.7–4.0)
MCHC: 33 g/dL (ref 30.0–36.0)
MCV: 97.9 fl (ref 78.0–100.0)
Monocytes Absolute: 0.5 10*3/uL (ref 0.1–1.0)
Monocytes Relative: 8.3 % (ref 3.0–12.0)
Neutro Abs: 3 10*3/uL (ref 1.4–7.7)
Neutrophils Relative %: 53.6 % (ref 43.0–77.0)
Platelets: 223 10*3/uL (ref 150.0–400.0)
RBC: 3.36 Mil/uL — ABNORMAL LOW (ref 3.87–5.11)
RDW: 13.7 % (ref 11.5–15.5)
WBC: 5.5 10*3/uL (ref 4.0–10.5)

## 2019-01-13 LAB — HEPATIC FUNCTION PANEL
ALT: 9 U/L (ref 0–35)
AST: 13 U/L (ref 0–37)
Albumin: 3.7 g/dL (ref 3.5–5.2)
Alkaline Phosphatase: 64 U/L (ref 39–117)
Bilirubin, Direct: 0.1 mg/dL (ref 0.0–0.3)
Total Bilirubin: 0.4 mg/dL (ref 0.2–1.2)
Total Protein: 7.2 g/dL (ref 6.0–8.3)

## 2019-01-13 LAB — TSH: TSH: 1.5 u[IU]/mL (ref 0.35–4.50)

## 2019-01-13 LAB — LIPID PANEL
Cholesterol: 203 mg/dL — ABNORMAL HIGH (ref 0–200)
HDL: 49.4 mg/dL (ref >39.00)
LDL Cholesterol: 115 mg/dL — ABNORMAL HIGH (ref 0–99)
NonHDL: 153.87
Total CHOL/HDL Ratio: 4
Triglycerides: 196 mg/dL — ABNORMAL HIGH (ref 0.0–149.0)
VLDL: 39.2 mg/dL (ref 0.0–40.0)

## 2019-01-13 LAB — HEMOGLOBIN A1C: Hgb A1c MFr Bld: 5.7 % (ref 4.6–6.5)

## 2019-01-13 NOTE — Progress Notes (Signed)
Subjective:    Patient ID: Kimberly Castillo, female    DOB: December 08, 1933, 86 y.o.   MRN: [id number]  HPI Here for wellness and f/u;  Overall doing ok;  Pt denies Chest pain, worsening SOB, DOE, wheezing, orthopnea, PND, worsening LE edema, palpitations, dizziness or syncope.  Pt denies neurological change such as new headache, facial or extremity weakness.  Pt denies polydipsia, polyuria, or low sugar symptoms. Pt states overall good compliance with treatment and medications, good tolerability, and has been trying to follow appropriate diet.  Pt denies worsening depressive symptoms, suicidal ideation or panic. No fever, night sweats, wt loss, loss of appetite, or other constitutional symptoms.  Pt states good ability with ADL's, has low fall risk, home safety reviewed and adequate, no other significant changes in hearing or vision, and only occasionally active with exercise  No new complaints except for "weak" right wrist Past Medical History:  Diagnosis Date  . A-fib ([place])   . Anxiety state 12/22/2013  . Arthritis    hands, back  . Arthritis, lumbar spine 12/19/2012  . Cataracts, bilateral   . Chronic lower back pain 12/19/2012  . Diabetes mellitus ([place])   . Diverticulitis   . Dysrhythmia   . GERD (gastroesophageal reflux disease)   . Glaucoma   . Headache    hx migraines  . Heart murmur   . History of shingles   . Hyperlipidemia   . Hypertension   . Numbness    rt side of rt leg  . Post herpetic neuralgia 12/19/2012  . Stroke [facility])    may 2020   Past Surgical History:  Procedure Laterality Date  . ABDOMINAL HYSTERECTOMY  1964   partial  . COLON RESECTION N/A 09/02/2013   Procedure: [place] colectomy;  Surgeon: Excell Seltzer, MD;  [facility];  Service: General;  Laterality: N/A;  . COLONOSCOPY    . COLOSTOMY TAKEDOWN N/A 11/09/2014   Procedure: TAKEDOWN [place] COLOSTOMY;  Surgeon: Excell Seltzer, MD;  [facility];  Service: General;  Laterality: N/A;  . [place]   left  . LOOP RECORDER INSERTION N/A 05/19/2018   Procedure: LOOP RECORDER INSERTION;  Surgeon: Evans Lance, MD;  Location: [place] CV LAB;  Service: Cardiovascular;  Laterality: N/A;  . POLYPECTOMY      reports that she quit smoking about 21 years ago. Her smoking use included cigarettes. She started smoking about 27 years ago. She has never used smokeless tobacco. She reports that she does not drink alcohol or use drugs. family history includes Hypertension in her maternal grandmother and mother. No Known Allergies Current Outpatient Medications on File Prior to Visit  Medication Sig Dispense Refill  . apixaban (ELIQUIS) 2.5 MG TABS tablet Take 1 tablet (2.5 mg total) by mouth 2 (two) times daily. 60 tablet 3  . atorvastatin (LIPITOR) 80 MG tablet Take 1 tablet (80 mg total) by mouth daily at 6 PM. 90 tablet 3  . blood glucose meter kit and supplies KIT Use up to four times daily as directed. E11.9 1 each 0  . Blood Glucose Monitoring Suppl (ONE TOUCH ULTRA 2) w/Device KIT USE as directed 1 each 0  . diltiazem (CARDIZEM) 120 MG tablet Take 1 tablet (120 mg total) by mouth daily. For heart and blood pressure 90 tablet 3  . Lancets MISC Use as directed once daily 100 each 11  . meclizine (ANTIVERT) 12.5 MG tablet Take 1 tablet (12.5 mg total) by  mouth 3 (three) times daily as needed for dizziness. 30 tablet 0  . metFORMIN (GLUCOPHAGE) 500 MG tablet Take 1 tablet (500 mg total) by mouth 2 (two) times daily with a meal. For diabetes 180 tablet 3  . metoprolol tartrate (LOPRESSOR) 25 MG tablet Take 1 tablet (25 mg total) by mouth 2 (two) times daily. 60 tablet 1  . pantoprazole (PROTONIX) 40 MG tablet Take 1 tablet (40 mg total) by mouth daily. For reflux 90 tablet 0  . tiZANidine (ZANAFLEX) 4 MG tablet TAKE 1 TABLET BY MOUTH EVERY 6 HOURS AS NEEDED FOR MUSCLE SPASM 40 tablet 0  . Vitamin D, Ergocalciferol, (DRISDOL) 1.25 MG (50000 UT) CAPS capsule Take 1  capsule (50,000 Units total) by mouth every 7 (seven) days. 12 capsule 0   No current facility-administered medications on file prior to visit.   Review of Systems Constitutional: Negative for other unusual diaphoresis, sweats, appetite or weight changes HENT: Negative for other worsening hearing loss, ear pain, facial swelling, mouth sores or neck stiffness.   Eyes: Negative for other worsening pain, redness or other visual disturbance.  Respiratory: Negative for other stridor or swelling Cardiovascular: Negative for other palpitations or other chest pain  Gastrointestinal: Negative for worsening diarrhea or loose stools, blood in stool, distention or other pain Genitourinary: Negative for hematuria, flank pain or other change in urine volume.  Musculoskeletal: Negative for myalgias or other joint swelling.  Skin: Negative for other color change, or other wound or worsening drainage.  Neurological: Negative for other syncope or numbness. Hematological: Negative for other adenopathy or swelling Psychiatric/Behavioral: Negative for hallucinations, other worsening agitation, SI, self-injury, or new decreased concentration All otherwise neg per pt     Objective:   Physical Exam BP (!) 180/102   Pulse 84   Temp 98.8 F (37.1 C)   Resp 14   Ht _0  (1.575 m)   BMI 27.44 kg/m  VS noted,  Constitutional: Pt is oriented to person, place, and time. Appears well-developed and well-nourished, in no significant distress and comfortable Head: Normocephalic and atraumatic  Eyes: Conjunctivae and EOM are normal. Pupils are equal, round, and reactive to light Right Ear: External ear normal without discharge Left Ear: External ear normal without discharge Nose: Nose without discharge or deformity Mouth/Throat: Oropharynx is without other ulcerations and moist  Neck: Normal range of motion. Neck supple. No JVD present. No tracheal deviation present or significant neck LA or mass Cardiovascular:  Normal rate, regular rhythm, normal heart sounds and intact distal pulses.   Pulmonary/Chest: WOB normal and breath sounds without rales or wheezing  Abdominal: Soft. Bowel sounds are normal. NT. No HSM  Musculoskeletal: Normal range of motion. Exhibits no edema Lymphadenopathy: Has no other cervical adenopathy.  Neurological: Pt is alert and oriented to person, place, and time. Pt has normal reflexes. No cranial nerve deficit. Motor grossly intact, Gait intact Skin: Skin is warm and dry. No rash noted or new ulcerations Psychiatric:  Has normal mood and affect. Behavior is normal without agitation All otherwise neg per pt Lab Results  Component Value Date   WBC 6.2 10/20/2018   HGB 11.0 (L) 10/20/2018   HCT 33.9 (L) 10/20/2018   PLT 242 10/20/2018   GLUCOSE 108 (H) 10/20/2018   CHOL 133 07/03/2018   TRIG 193.0 (H) 07/03/2018   HDL 39.30 07/03/2018   LDLDIRECT 129.0 02/02/2016   LDLCALC 55 07/03/2018   ALT 18 05/16/2018   AST 25 05/16/2018   NA 141 10/20/2018  K 3.7 10/20/2018   CL 108 10/20/2018   CREATININE 1.83 (H) 10/20/2018   BUN 30 (H) 10/20/2018   CO2 23 10/20/2018   TSH 1.06 05/15/2018   INR 1.1 05/16/2018   HGBA1C 6.4 05/15/2018   MICROALBUR 360.2 (H) 05/15/2018         Assessment & Plan:

## 2019-01-13 NOTE — Assessment & Plan Note (Signed)
Chronic persistent, ok for renal referral, f/u lab today

## 2019-01-13 NOTE — Assessment & Plan Note (Signed)
stable overall by history and exam, recent data reviewed with pt, and pt to continue medical treatment as before,  to f/u any worsening symptoms or concerns  

## 2019-01-13 NOTE — Assessment & Plan Note (Signed)
Ok for otc vit d3 2000 u qd

## 2019-01-13 NOTE — Assessment & Plan Note (Signed)
Did not take med today, wonder about compliance overall, to restart med

## 2019-01-13 NOTE — Patient Instructions (Signed)
Ok to take OTC Vitamin D3 at 2000 units per day, indefinitely.  Please continue all other medications as before, and refills have been done if requested.  Please have the pharmacy call with any other refills you may need.  Please continue your efforts at being more active, low cholesterol diet, and weight control.  You are otherwise up to date with prevention measures today.  Please keep your appointments with your specialists as you may have planned  You will be contacted regarding the referral for: Kidney doctor (nephrologist)  Please go to the LAB at the blood drawing area for the tests to be done  You will be contacted by phone if any changes need to be made immediately.  Otherwise, you will receive a letter about your results with an explanation, but please check with MyChart first.  Please remember to sign up for MyChart if you have not done so, as this will be important to you in the future with finding out test results, communicating by private email, and scheduling acute appointments online when needed.  Please return in 6 months, or sooner if needed

## 2019-01-13 NOTE — Assessment & Plan Note (Signed)

## 2019-01-15 ENCOUNTER — Encounter: Payer: Self-pay | Admitting: Internal Medicine

## 2019-01-15 LAB — URINALYSIS, ROUTINE W REFLEX MICROSCOPIC
Bilirubin Urine: NEGATIVE
Hgb urine dipstick: NEGATIVE
Ketones, ur: NEGATIVE
Leukocytes,Ua: NEGATIVE
Nitrite: NEGATIVE
RBC / HPF: NONE SEEN (ref 0–?)
Specific Gravity, Urine: 1.025 (ref 1.000–1.030)
Total Protein, Urine: >=300 — AB
Urine Glucose: NEGATIVE
Urobilinogen, UA: 0.2 (ref 0.0–1.0)
pH: 6 (ref 5.0–8.0)

## 2019-01-15 LAB — MICROALBUMIN / CREATININE URINE RATIO
Creatinine,U: 123.6 mg/dL
Microalb Creat Ratio: 74.6 mg/g — ABNORMAL HIGH (ref 0.0–30.0)
Microalb, Ur: 92.2 mg/dL — ABNORMAL HIGH (ref 0.0–1.9)

## 2019-01-19 ENCOUNTER — Other Ambulatory Visit: Payer: Self-pay | Admitting: Pharmacist

## 2019-01-19 DIAGNOSIS — I480 Paroxysmal atrial fibrillation: Secondary | ICD-10-CM

## 2019-01-19 MED ORDER — APIXABAN 2.5 MG PO TABS: 2.5000 mg | ORAL_TABLET | Freq: Two times a day (BID) | ORAL | 5 refills | Status: DC

## 2019-01-19 NOTE — Progress Notes (Signed)
Age 86, weight 68kg, SCr 2.09 on 01/13/19, afib indication, pt has not been seen in heartcare office but has had frequent follow up with device clinic and in office appt was canceled by Oda Kilts 07/07/18. Will send in refill.

## 2019-01-20 NOTE — Telephone Encounter (Signed)
Son Kimberly Castillo on [place] has called asking about the status of the letter needed for pt.  Please call

## 2019-01-26 ENCOUNTER — Emergency Department (HOSPITAL_COMMUNITY)
Admission: EM | Admit: 2019-01-26 | Discharge: 2019-01-27 | Disposition: A | Discharge status: Home / Self Care | Payer: Medicare HMO | Attending: Emergency Medicine | Admitting: Emergency Medicine

## 2019-01-26 ENCOUNTER — Other Ambulatory Visit: Payer: Self-pay

## 2019-01-26 DIAGNOSIS — Z79899 Other long term (current) drug therapy: Secondary | ICD-10-CM | POA: Insufficient documentation

## 2019-01-26 DIAGNOSIS — E1122 Type 2 diabetes mellitus with diabetic chronic kidney disease: Secondary | ICD-10-CM | POA: Insufficient documentation

## 2019-01-26 DIAGNOSIS — Z7901 Long term (current) use of anticoagulants: Secondary | ICD-10-CM | POA: Insufficient documentation

## 2019-01-26 DIAGNOSIS — I129 Hypertensive chronic kidney disease with stage 1 through stage 4 chronic kidney disease, or unspecified chronic kidney disease: Secondary | ICD-10-CM | POA: Insufficient documentation

## 2019-01-26 DIAGNOSIS — N183 Chronic kidney disease, stage 3 unspecified: Secondary | ICD-10-CM | POA: Diagnosis not present

## 2019-01-26 DIAGNOSIS — Z7984 Long term (current) use of oral hypoglycemic drugs: Secondary | ICD-10-CM | POA: Insufficient documentation

## 2019-01-26 DIAGNOSIS — R040 Epistaxis: Secondary | ICD-10-CM | POA: Diagnosis not present

## 2019-01-27 ENCOUNTER — Other Ambulatory Visit: Payer: Self-pay

## 2019-01-27 ENCOUNTER — Encounter (HOSPITAL_COMMUNITY): Payer: Self-pay | Admitting: Emergency Medicine

## 2019-01-27 MED ORDER — CEPHALEXIN 500 MG PO CAPS: 500.0000 mg | ORAL_CAPSULE | Freq: Three times a day (TID) | ORAL | 0 refills | Status: DC

## 2019-01-27 MED ORDER — TRANEXAMIC ACID 1000 MG/10ML IV SOLN
500.0000 mg | Freq: Once | INTRAVENOUS | Status: AC
  Administered 2019-01-27: 500 mg via TOPICAL
  Filled 2019-01-27: qty 10

## 2019-01-27 MED ORDER — LIDOCAINE HCL URETHRAL/MUCOSAL 2 % EX GEL
1.0000 "application " | Freq: Once | CUTANEOUS | Status: AC
  Administered 2019-01-27: 1 via TOPICAL
  Filled 2019-01-27: qty 20

## 2019-01-27 MED ORDER — HYDROCODONE-ACETAMINOPHEN 5-325 MG PO TABS: 1.0000 | ORAL_TABLET | Freq: Four times a day (QID) | ORAL | 0 refills | Status: DC | PRN

## 2019-01-27 NOTE — Discharge Instructions (Addendum)
You were seen today for nosebleed.  Maintain packing for the next 4 to 5 days.  Take pain medication and antibiotics as prescribed.  Follow-up with ENT by the end of the week.  If bleeding recurs, you need to be reevaluated.

## 2019-01-27 NOTE — ED Notes (Signed)
Discharge instructions discussed with pt. Pt verbalized understanding with no questions at this time. Pt to go home with son.

## 2019-01-27 NOTE — ED Notes (Signed)
Pharmacy call to request TXA

## 2019-01-27 NOTE — ED Triage Notes (Signed)
Pt from home c/o epistaxis. Ongoing for about 1 hr. Denies injury/trauma. Blood Thinner: Eliquis.   ENT cart at bedside.

## 2019-01-27 NOTE — ED Provider Notes (Signed)
[facility] EMERGENCY DEPARTMENT Provider Note   CSN: [id number] Arrival date & time: 01/26/19  2347     History Chief Complaint  Patient presents with  . Epistaxis    Kimberly Castillo is a 86 y.o. female.  HPI     This is an 86 year old female with a history of atrial fibrillation on Eliquis, diabetes, hypertension, hyperlipidemia who presents with nosebleed.  Patient reports her nose started bleeding 1 to 2 hours prior to arrival.  She is unsure which naris she is bleeding from.  Denies any recent history of nosebleeds.  Denies any trauma.  She states that she can feel it going down the back of her throat.  Denies any fevers, chest pain, shortness of breath.  No recent illnesses.  Past Medical History:  Diagnosis Date  . A-fib ([place])   . Anxiety state 12/22/2013  . Arthritis    hands, back  . Arthritis, lumbar spine 12/19/2012  . Cataracts, bilateral   . Chronic lower back pain 12/19/2012  . Diabetes mellitus ([place])   . Diverticulitis   . Dysrhythmia   . GERD (gastroesophageal reflux disease)   . Glaucoma   . Headache    hx migraines  . Heart murmur   . History of shingles   . Hyperlipidemia   . Hypertension   . Numbness    rt side of rt leg  . Post herpetic neuralgia 12/19/2012  . Stroke [facility])    may 2020    Patient Active Problem List   Diagnosis Date Noted  . Acute CVA (cerebrovascular accident) ([place]) 05/17/2018  . CVA (cerebral vascular accident) ([place]) 05/16/2018  . Noncompliance 05/16/2018  . Vitamin D deficiency 05/16/2018  . Abnormal EKG 05/16/2018  . Headache 05/16/2018  . Memory changes 05/13/2018  . Right knee pain 02/12/2017  . Spondylolisthesis, lumbar region 03/02/2016  . CKD (chronic kidney disease), stage III ([place]) 02/16/2016  . Chronic low back pain 07/12/2015  . Bilateral foot pain 01/26/2015  . Colostomy status ([place]) 11/09/2014  . Rash 06/24/2014  . Preventative health care 12/31/2013  . Anxiety state 12/22/2013  .  Yeast vaginitis 12/22/2013  . Diverticulitis of colon with perforation s/p colectomy/ostomy 09/02/2013 09/02/2013  . Post herpetic neuralgia 12/19/2012  . Arthritis, lumbar spine 12/19/2012  . Glaucoma   . A-fib ([place])   . GERD (gastroesophageal reflux disease) 10/06/2012  . DM type 2 (diabetes mellitus, type 2) ([place]) 09/22/2012  . Hypertension 09/22/2012  . Hyperlipemia 09/22/2012    Past Surgical History:  Procedure Laterality Date  . ABDOMINAL HYSTERECTOMY  1964   partial  . COLON RESECTION N/A 09/02/2013   Procedure: [place] colectomy;  Surgeon: Excell Seltzer, MD;  Location: [facility] ORS;  Service: General;  Laterality: N/A;  . COLONOSCOPY    . COLOSTOMY TAKEDOWN N/A 11/09/2014   Procedure: TAKEDOWN [place] COLOSTOMY;  Surgeon: Excell Seltzer, MD;  Location: [facility] ORS;  Service: General;  Laterality: N/A;  . [place]   left  . LOOP RECORDER INSERTION N/A 05/19/2018   Procedure: LOOP RECORDER INSERTION;  Surgeon: Evans Lance, MD;  Location: [facility] CV LAB;  Service: Cardiovascular;  Laterality: N/A;  . POLYPECTOMY       OB History   No obstetric history on file.     Family History  Problem Relation Age of Onset  . Hypertension Mother   . Hypertension Maternal Grandmother   . Colon cancer Neg Hx   . Stomach cancer Neg Hx   .  Colon polyps Neg Hx   . Esophageal cancer Neg Hx   . Rectal cancer Neg Hx     Social History   Tobacco Use  . Smoking status: Former Smoker    Types: Cigarettes    Start date: 09/17/1991    Quit date: 10/31/1997    Years since quitting: 21.2  . Smokeless tobacco: Never Used  Substance Use Topics  . Alcohol use: No  . Drug use: No    Home Medications Prior to Admission medications   Medication Sig Start Date End Date Taking? Authorizing Provider  apixaban (ELIQUIS) 2.5 MG TABS tablet Take 1 tablet (2.5 mg total) by mouth 2 (two) times daily. 01/19/19  Yes Shirley Friar, PA-C  atorvastatin  (LIPITOR) 80 MG tablet Take 1 tablet (80 mg total) by mouth daily at 6 PM. 06/23/18  Yes McCue, Janett Billow, NP  diltiazem (CARDIZEM) 120 MG tablet Take 1 tablet (120 mg total) by mouth daily. For heart and blood pressure 05/13/18  Yes Biagio Borg, MD  meclizine (ANTIVERT) 12.5 MG tablet Take 1 tablet (12.5 mg total) by mouth 3 (three) times daily as needed for dizziness. 10/20/18  Yes Lucrezia Starch, MD  metFORMIN (GLUCOPHAGE) 500 MG tablet Take 1 tablet (500 mg total) by mouth 2 (two) times daily with a meal. For diabetes 05/13/18  Yes Biagio Borg, MD  metoprolol tartrate (LOPRESSOR) 25 MG tablet Take 1 tablet (25 mg total) by mouth 2 (two) times daily. 05/19/18  Yes Gherghe, Vella Redhead, MD  pantoprazole (PROTONIX) 40 MG tablet Take 1 tablet (40 mg total) by mouth daily. For reflux 05/30/18  Yes Biagio Borg, MD  tiZANidine (ZANAFLEX) 4 MG tablet TAKE 1 TABLET BY MOUTH EVERY 6 HOURS AS NEEDED FOR MUSCLE SPASM Patient taking differently: Take 4 mg by mouth every 6 (six) hours as needed for muscle spasms.  12/19/18  Yes Biagio Borg, MD  Vitamin D, Ergocalciferol, (DRISDOL) 1.25 MG (50000 UT) CAPS capsule Take 1 capsule (50,000 Units total) by mouth every 7 (seven) days. Patient taking differently: Take 50,000 Units by mouth every Tuesday.  07/03/18  Yes Biagio Borg, MD  blood glucose meter kit and supplies KIT Use up to four times daily as directed. E11.9 05/20/18   Biagio Borg, MD  Blood Glucose Monitoring Suppl (ONE TOUCH ULTRA 2) w/Device KIT USE as directed 05/13/18   Biagio Borg, MD  cephALEXin (KEFLEX) 500 MG capsule Take 1 capsule (500 mg total) by mouth 3 (three) times daily. 01/27/19   Gay Rape, Barbette Hair, MD  HYDROcodone-acetaminophen (NORCO/VICODIN) 5-325 MG tablet Take 1 tablet by mouth every 6 (six) hours as needed. 01/27/19   Merryl Hacker, MD  Lancets MISC Use as directed once daily 05/13/18   Biagio Borg, MD    Allergies    Patient has no known allergies.  Review of Systems     Review of Systems  Constitutional: Negative for fever.  HENT: Positive for nosebleeds.   Respiratory: Negative for shortness of breath.   Cardiovascular: Negative for chest pain.  Gastrointestinal: Positive for nausea. Negative for vomiting.  All other systems reviewed and are negative.   Physical Exam Updated Vital Signs BP (!) 148/108   Pulse (!) 59   Temp 98.5 F (36.9 C) (Axillary)   Resp 18   Ht 1.575 m (5' 2" )   Wt 65.8 kg   SpO2 97%   BMI 26.52 kg/m   Physical Exam Vitals and nursing note reviewed.  Constitutional:      Appearance: She is well-developed.     Comments: ABCs intact, patient spitting clot out of her mouth  HENT:     Head: Normocephalic and atraumatic.     Nose:     Comments: Large clot evacuated mostly from the right naris, inspection of the naris with friability noted over the septum    Mouth/Throat:     Mouth: Mucous membranes are moist.  Eyes:     Pupils: Pupils are equal, round, and reactive to light.  Cardiovascular:     Rate and Rhythm: Normal rate. Rhythm irregular.  Pulmonary:     Effort: Pulmonary effort is normal. No respiratory distress.  Abdominal:     Palpations: Abdomen is soft.     Tenderness: There is no abdominal tenderness.  Musculoskeletal:     Cervical back: Neck supple.     Right lower leg: No edema.     Left lower leg: No edema.  Skin:    General: Skin is warm and dry.  Neurological:     Mental Status: She is alert and oriented to person, place, and time.  Psychiatric:        Mood and Affect: Mood normal.     ED Results / Procedures / Treatments   Labs (all labs ordered are listed, but only abnormal results are displayed) Labs Reviewed - No data to display  EKG None  Radiology No results found.  Procedures .Epistaxis Management  Date/Time: 01/27/2019 1:22 AM Performed by: Merryl Hacker, MD Authorized by: Merryl Hacker, MD   Consent:    Consent obtained:  Verbal   Consent given by:   Patient   Risks discussed:  Infection, nasal injury and pain   Alternatives discussed:  No treatment Anesthesia (see MAR for exact dosages):    Anesthesia method:  Topical application   Topical anesthetic:  Lidocaine gel Procedure details:    Treatment site:  R anterior   Treatment method:  Nasal tampon   Treatment complexity:  Extensive   Treatment episode: initial   Post-procedure details:    Assessment:  Bleeding stopped   Patient tolerance of procedure:  Tolerated well, no immediate complications Comments:     5.5 cm Rhino Rocket placed in TXA and inserted into the right naris without complication   (including critical care time)  Medications Ordered in ED Medications  lidocaine (XYLOCAINE) 2 % jelly 1 application (1 application Topical Given 01/27/19 0032)  tranexamic acid (CYKLOKAPRON) injection 500 mg (500 mg Topical Given 01/27/19 0033)    ED Course  I have reviewed the triage vital signs and the nursing notes.  Pertinent labs & imaging results that were available during my care of the patient were reviewed by me and considered in my medical decision making (see chart for details).  Clinical Course as of Jan 27 223  Tue Jan 27, 2019  0120 On recheck, tamponade appears to be achieved.  We will continue to monitor closely.   [CH]    Clinical Course User Index [CH] Aneyah Lortz, Barbette Hair, MD   MDM Rules/Calculators/A&P                       Patient presents with epistaxis.  She is currently on Eliquis.  Bleeding appears to be coming from the right naris.  She has some friability over the nasal septum with significant bruising.  TXA soaked Rhino Rocket was placed with good tamponade.  On multiple rechecks she continues to  have hemostasis.  Will place on antibiotics and provide with pain medication.  Recommend ENT follow-up in 4 to 5 days.  After history, exam, and medical workup I feel the patient has been appropriately medically screened and is safe for discharge home.  Pertinent diagnoses were discussed with the patient. Patient was given return precautions.   Final Clinical Impression(s) / ED Diagnoses Final diagnoses:  Epistaxis    Rx / DC Orders ED Discharge Orders         Ordered    cephALEXin (KEFLEX) 500 MG capsule  3 times daily     01/27/19 0224    HYDROcodone-acetaminophen (NORCO/VICODIN) 5-325 MG tablet  Every 6 hours PRN     01/27/19 0224           Merryl Hacker, MD 01/27/19 [phone]

## 2019-01-30 DIAGNOSIS — R040 Epistaxis: Secondary | ICD-10-CM | POA: Diagnosis not present

## 2019-01-30 DIAGNOSIS — J342 Deviated nasal septum: Secondary | ICD-10-CM | POA: Diagnosis not present

## 2019-02-09 ENCOUNTER — Ambulatory Visit (INDEPENDENT_AMBULATORY_CARE_PROVIDER_SITE_OTHER): Payer: Medicare HMO | Admitting: *Deleted

## 2019-02-09 DIAGNOSIS — I639 Cerebral infarction, unspecified: Secondary | ICD-10-CM

## 2019-02-09 LAB — CUP PACEART REMOTE DEVICE CHECK
Date Time Interrogation Session: 20210208000324
Implantable Pulse Generator Implant Date: 20200518

## 2019-02-09 NOTE — Progress Notes (Signed)
ILR Remote 

## 2019-02-18 ENCOUNTER — Encounter: Payer: Self-pay | Admitting: Occupational Therapy

## 2019-02-18 NOTE — Therapy (Signed)
[facility] [address] [place], [place], [address] Phone: [phone]   Fax:  [phone]  Patient Details  Name: Kimberly Castillo MRN: [id number] Date of Birth: [birth year] Referring Provider:  No ref. provider found  Encounter Date: 02/18/2019  OCCUPATIONAL THERAPY DISCHARGE SUMMARY  Visits from Start of Care: 6  Current functional level related to goals / functional outcomes: OT Short Term Goals - 09/30/18 1154      OT SHORT TERM GOAL #1   Title  Pt will perform simple HEP for coordination and hand strength with min cueing.--check STGs 09/27/18    Time  4    Period  Weeks    Status Partially met, instructed but needs incr cueing     OT SHORT TERM GOAL #2   Title  Pt will perform simple tabletop visual scanning with at least 90% accuracy to assist with ADLs and ability to complete word searches.    Time  4    Period  Weeks    Status Not met.  Max cues     OT SHORT TERM GOAL #3   Title  Pt will be able to attend to functional task for at least 8 min without cueing/redirection.    Time  4    Period  Weeks    Status  Not met     OT SHORT TERM GOAL #4   Title  Pt will perform simple home maintenance task in standing for at least 70mn prior to rest break.    Time  4    Period  Weeks    Status  Achieved   09/30/18:  Able to stand and fold clothes for 5 min     OT Long Term Goals - 08/27/18 1947      OT LONG TERM GOAL #1   Title  Pt will verbalize understanding of visual compensations strategies for ADLs/IADLs.    Time  8    Period  Weeks    Status Not fully met     OT LONG TERM GOAL #2   Title  Pt perform simple environmental scanning/navigation with at least 90% accuracy for improved safety.    Time  8    Period  Weeks    Status Not met     OT LONG TERM GOAL #3   Title  Pt will improve coordination/visual scanning for ADLs as shown by improving time on 9-hole peg test by at least 6sec with R hand.    Time   8    Period  Weeks    Status Not met/unable to reassess due to pt not returning.     OT LONG TERM GOAL #4   Title  Pt will improve R dominant hand strength by at least 8lbs to assist with ADLs/IADLs.    Time  8    Period  Weeks    Status  Not met/unable to reassess due to pt not returning.     OT LONG TERM GOAL #5   Title  Pt will perform simple home maintenance task in standing for at least 10 min prior to rest break.    Time  8    Period  Weeks    Status  Not met        Remaining deficits: Pt continues to demo decr strength, coordination,RUE functional use, visual perceptual deficits/inattention, and cognitive deficits   Education / Equipment: Pt/family educated in HEP for coordination and hand strength, visual/cognitive HEP, and importance of encouraging  pt to perform visual scanning.    Plan: Patient agrees to discharge.  Patient goals were not met. Patient is being discharged due to                                                     Pt did not return for last scheduled visit, but family verbalized understanding of recommendations for home as cognitive deficits were barrier to progress. ?????        [facility] 02/18/2019, 8:11 AM  [facility] [address] [place], [place], [address] Phone: [phone]   Fax:  [place], OTR/L [facility] [address] [place], [place]  [address] [phone] phone [phone] 02/18/19 8:11 AM

## 2019-03-12 ENCOUNTER — Ambulatory Visit (INDEPENDENT_AMBULATORY_CARE_PROVIDER_SITE_OTHER): Payer: Medicare HMO | Admitting: *Deleted

## 2019-03-12 DIAGNOSIS — I639 Cerebral infarction, unspecified: Secondary | ICD-10-CM

## 2019-03-12 LAB — CUP PACEART REMOTE DEVICE CHECK
Date Time Interrogation Session: 20210311002839
Implantable Pulse Generator Implant Date: 20200518

## 2019-03-12 NOTE — Progress Notes (Signed)
ILR Remote 

## 2019-04-13 ENCOUNTER — Ambulatory Visit (INDEPENDENT_AMBULATORY_CARE_PROVIDER_SITE_OTHER): Payer: Medicare HMO | Admitting: *Deleted

## 2019-04-13 DIAGNOSIS — I639 Cerebral infarction, unspecified: Secondary | ICD-10-CM

## 2019-04-13 LAB — CUP PACEART REMOTE DEVICE CHECK
Date Time Interrogation Session: 20210411033701
Implantable Pulse Generator Implant Date: 20200518

## 2019-04-13 NOTE — Progress Notes (Signed)
ILR Remote 

## 2019-04-14 DIAGNOSIS — D631 Anemia in chronic kidney disease: Secondary | ICD-10-CM | POA: Diagnosis not present

## 2019-04-14 DIAGNOSIS — E1122 Type 2 diabetes mellitus with diabetic chronic kidney disease: Secondary | ICD-10-CM | POA: Diagnosis not present

## 2019-04-14 DIAGNOSIS — I129 Hypertensive chronic kidney disease with stage 1 through stage 4 chronic kidney disease, or unspecified chronic kidney disease: Secondary | ICD-10-CM | POA: Diagnosis not present

## 2019-04-14 DIAGNOSIS — I4891 Unspecified atrial fibrillation: Secondary | ICD-10-CM | POA: Diagnosis not present

## 2019-04-14 DIAGNOSIS — N189 Chronic kidney disease, unspecified: Secondary | ICD-10-CM | POA: Diagnosis not present

## 2019-04-14 DIAGNOSIS — R413 Other amnesia: Secondary | ICD-10-CM | POA: Diagnosis not present

## 2019-04-14 DIAGNOSIS — E1129 Type 2 diabetes mellitus with other diabetic kidney complication: Secondary | ICD-10-CM | POA: Diagnosis not present

## 2019-04-14 DIAGNOSIS — R809 Proteinuria, unspecified: Secondary | ICD-10-CM | POA: Diagnosis not present

## 2019-04-14 DIAGNOSIS — N184 Chronic kidney disease, stage 4 (severe): Secondary | ICD-10-CM | POA: Diagnosis not present

## 2019-04-16 ENCOUNTER — Other Ambulatory Visit: Payer: Self-pay | Admitting: Nephrology

## 2019-04-16 DIAGNOSIS — N184 Chronic kidney disease, stage 4 (severe): Secondary | ICD-10-CM

## 2019-04-20 ENCOUNTER — Telehealth: Payer: Self-pay | Admitting: Internal Medicine

## 2019-04-20 MED ORDER — GLIPIZIDE ER 2.5 MG PO TB24: 2.5000 mg | ORAL_TABLET | Freq: Every day | ORAL | 3 refills | Status: DC

## 2019-04-20 NOTE — Telephone Encounter (Signed)
Staff to contact pt - ok to stop the metformin due to kidney slowing  Please take all new medication as prescribed - the glucotrol xl 2.5 mg per day

## 2019-04-21 NOTE — Telephone Encounter (Signed)
Called pt and left a vm to call clinic for medication changes per Dr. Jenny Reichmann.

## 2019-04-24 ENCOUNTER — Other Ambulatory Visit: Payer: Medicare HMO

## 2019-04-24 ENCOUNTER — Ambulatory Visit
Admission: RE | Admit: 2019-04-24 | Discharge: 2019-04-24 | Disposition: A | Discharge status: Home / Self Care | Payer: Medicare HMO | Source: Ambulatory Visit | Attending: Nephrology | Admitting: Nephrology

## 2019-04-24 DIAGNOSIS — N184 Chronic kidney disease, stage 4 (severe): Secondary | ICD-10-CM | POA: Diagnosis not present

## 2019-04-24 DIAGNOSIS — N281 Cyst of kidney, acquired: Secondary | ICD-10-CM | POA: Diagnosis not present

## 2019-05-05 NOTE — Telephone Encounter (Addendum)
    Please return call to discuss mediation changes noted in previous message

## 2019-05-07 ENCOUNTER — Telehealth: Payer: Self-pay

## 2019-05-07 MED ORDER — MECLIZINE HCL 12.5 MG PO TABS: 12.5000 mg | ORAL_TABLET | Freq: Three times a day (TID) | ORAL | 0 refills | Status: DC | PRN

## 2019-05-07 NOTE — Telephone Encounter (Signed)
For meclizine prn - done erx, but should go to ED for worsening or trying to fall down

## 2019-05-07 NOTE — Telephone Encounter (Signed)
Spoke with patient's son and info given. 

## 2019-05-13 LAB — CUP PACEART REMOTE DEVICE CHECK
Date Time Interrogation Session: 20210512033539
Implantable Pulse Generator Implant Date: 20200518

## 2019-05-18 ENCOUNTER — Ambulatory Visit (INDEPENDENT_AMBULATORY_CARE_PROVIDER_SITE_OTHER): Payer: Medicare HMO | Admitting: *Deleted

## 2019-05-18 DIAGNOSIS — I63432 Cerebral infarction due to embolism of left posterior cerebral artery: Secondary | ICD-10-CM

## 2019-05-18 NOTE — Progress Notes (Signed)
Carelink Summary Report / Loop Recorder 

## 2019-06-08 DIAGNOSIS — E278 Other specified disorders of adrenal gland: Secondary | ICD-10-CM | POA: Diagnosis not present

## 2019-06-08 DIAGNOSIS — I4891 Unspecified atrial fibrillation: Secondary | ICD-10-CM | POA: Diagnosis not present

## 2019-06-08 DIAGNOSIS — E213 Hyperparathyroidism, unspecified: Secondary | ICD-10-CM | POA: Diagnosis not present

## 2019-06-08 DIAGNOSIS — I129 Hypertensive chronic kidney disease with stage 1 through stage 4 chronic kidney disease, or unspecified chronic kidney disease: Secondary | ICD-10-CM | POA: Diagnosis not present

## 2019-06-08 DIAGNOSIS — D631 Anemia in chronic kidney disease: Secondary | ICD-10-CM | POA: Diagnosis not present

## 2019-06-08 DIAGNOSIS — E1122 Type 2 diabetes mellitus with diabetic chronic kidney disease: Secondary | ICD-10-CM | POA: Diagnosis not present

## 2019-06-08 DIAGNOSIS — R413 Other amnesia: Secondary | ICD-10-CM | POA: Diagnosis not present

## 2019-06-08 DIAGNOSIS — N184 Chronic kidney disease, stage 4 (severe): Secondary | ICD-10-CM | POA: Diagnosis not present

## 2019-06-08 DIAGNOSIS — E1129 Type 2 diabetes mellitus with other diabetic kidney complication: Secondary | ICD-10-CM | POA: Diagnosis not present

## 2019-06-18 ENCOUNTER — Other Ambulatory Visit: Payer: Self-pay

## 2019-06-18 ENCOUNTER — Other Ambulatory Visit: Payer: Self-pay | Admitting: Internal Medicine

## 2019-06-18 MED ORDER — MECLIZINE HCL 12.5 MG PO TABS: 12.5000 mg | ORAL_TABLET | Freq: Three times a day (TID) | ORAL | 0 refills | Status: DC | PRN

## 2019-06-21 ENCOUNTER — Emergency Department (HOSPITAL_COMMUNITY)
Admission: EM | Admit: 2019-06-21 | Discharge: 2019-06-21 | Disposition: A | Discharge status: Home / Self Care | Payer: Medicare HMO | Attending: Emergency Medicine | Admitting: Emergency Medicine

## 2019-06-21 ENCOUNTER — Other Ambulatory Visit: Payer: Self-pay

## 2019-06-21 DIAGNOSIS — I129 Hypertensive chronic kidney disease with stage 1 through stage 4 chronic kidney disease, or unspecified chronic kidney disease: Secondary | ICD-10-CM | POA: Diagnosis not present

## 2019-06-21 DIAGNOSIS — N183 Chronic kidney disease, stage 3 unspecified: Secondary | ICD-10-CM | POA: Diagnosis not present

## 2019-06-21 DIAGNOSIS — Z87891 Personal history of nicotine dependence: Secondary | ICD-10-CM | POA: Insufficient documentation

## 2019-06-21 DIAGNOSIS — Z79899 Other long term (current) drug therapy: Secondary | ICD-10-CM | POA: Diagnosis not present

## 2019-06-21 DIAGNOSIS — R040 Epistaxis: Secondary | ICD-10-CM | POA: Insufficient documentation

## 2019-06-21 DIAGNOSIS — Z8673 Personal history of transient ischemic attack (TIA), and cerebral infarction without residual deficits: Secondary | ICD-10-CM | POA: Diagnosis not present

## 2019-06-21 DIAGNOSIS — E1122 Type 2 diabetes mellitus with diabetic chronic kidney disease: Secondary | ICD-10-CM | POA: Insufficient documentation

## 2019-06-21 DIAGNOSIS — Z7901 Long term (current) use of anticoagulants: Secondary | ICD-10-CM | POA: Insufficient documentation

## 2019-06-21 DIAGNOSIS — I4891 Unspecified atrial fibrillation: Secondary | ICD-10-CM | POA: Diagnosis not present

## 2019-06-21 DIAGNOSIS — Z7984 Long term (current) use of oral hypoglycemic drugs: Secondary | ICD-10-CM | POA: Diagnosis not present

## 2019-06-21 LAB — CBC
HCT: 33.4 % — ABNORMAL LOW (ref 36.0–46.0)
Hemoglobin: 10.8 g/dL — ABNORMAL LOW (ref 12.0–15.0)
MCH: 31.7 pg (ref 26.0–34.0)
MCHC: 32.3 g/dL (ref 30.0–36.0)
MCV: 97.9 fL (ref 80.0–100.0)
Platelets: 213 10*3/uL (ref 150–400)
RBC: 3.41 MIL/uL — ABNORMAL LOW (ref 3.87–5.11)
RDW: 14.4 % (ref 11.5–15.5)
WBC: 5.6 10*3/uL (ref 4.0–10.5)
nRBC: 0 % (ref 0.0–0.2)

## 2019-06-21 MED ORDER — OXYMETAZOLINE HCL 0.05 % NA SOLN
1.0000 | Freq: Once | NASAL | Status: AC
  Administered 2019-06-21: 1 via NASAL
  Filled 2019-06-21: qty 30

## 2019-06-21 NOTE — Discharge Instructions (Signed)
If your nosebleed should recur, spray the Afrin in your nose or soak some gauze or tissue and place it inside your nose and apply direct pressure to the soft part of your nose for 20 minutes without letting up.  Should you continue bleeding return to the emergency department.  Also return if you feel lightheaded, feel like you might pass out when you stand, if you lose consciousness, become short of breath. Nasal Hygiene The nose has many positive effects on the air you breathe in that you may not be aware of. - Temperature regulation - Filtration and removal of particulate matter - Humidification - Defense against infections There are several things you can do to help keep your nose healthy. Foremost is nasal hygiene. This will help with your nose's natural function and keep it moist and healthy. Techniques to accomplish this are outlined below. These will help with nasal dryness, nasal crusting, and nose bleeds. They also assist with clearing thick mucus that cause you to blow your nose frequently and may be associated with thick postnasal drip.  1. Use nasal saline daily. You can buy small bottles of this over-the-counter at the drug store or grocery store. Some brand names are: [place], [place], [place]. Apply 2-3 sprays each nostril several times a day. If your nose feels dry or have had recent nasal surgery, try to use it every couple of hours. There is no medicine in it so it can be used as often as you like. Do NOT use the sprays containing decongestants. The appropriate way to apply nasal sprays: Place the nozzle just inside your nostril and point it towards the corner of your eye. Often, it is helpful to use the right-hand to spray into the left nasal cavity and use the left-hand to spray into the right side.  2. Use nasal saline irrigations and flushing.SinuCleanse, Simply Saline, [place]. Use this 1-2 times a day. We can also provide you with a recipe to use at home. Just ask [place]. To  prevent reintroducing bacteria back into your nose, please keep your irrigation equipment clean and dry between uses. Throw away and replace reusable irrigation equipment every 3 weeks.   3. Use Vaseline petroleum jelly or Aquaphor. You can apply this gently to each nostril 2-3 times a day to promote moisturization for your nose. You may also use triple antibiotic ointment such as Neosporin or Bacitracin. These can all be bought over-the-counter.  4. Consider other nasal emollients. A few preparations are available over-thecounter. Ponaris, Nose Better, Pretz. Ask your pharmacist what is available. Also, some nasal saline sprays have additives such as aloe and these are helpful.  5. Consider using a humidifier at home. If your nose feels dry and/or you have frequent nose bleeds, you can buy a humidifier for your home. Be cautious in using these if you have mold allergies.  6. Avoid excessive manual manipulation of your nose and nostrils. Frequent rubbing of your nostrils and the passing of tissues or fingers in your nostrils may aggravate nasal irritation from dryness and nose bleeds.

## 2019-06-21 NOTE — ED Provider Notes (Signed)
[place] EMERGENCY DEPARTMENT Provider Note   CSN: [id number] Arrival date & time: 06/21/19  4696     History Chief Complaint  Patient presents with  . Epistaxis    Kimberly Castillo is a 86 y.o. female    Epistaxis Location:  R nare Severity:  Moderate Duration:  1 hour Timing:  Intermittent Progression:  Resolved Chronicity:  Recurrent Context: anticoagulants   Relieved by:  Vasoconstrictors Associated symptoms: blood in oropharynx   Associated symptoms: no congestion, no cough, no dizziness, no facial pain, no fever, no headaches, no sinus pain, no sneezing, no sore throat and no syncope   Risk factors: no frequent nosebleeds        Past Medical History:  Diagnosis Date  . A-fib ([place])   . Anxiety state 12/22/2013  . Arthritis    hands, back  . Arthritis, lumbar spine 12/19/2012  . Cataracts, bilateral   . Chronic lower back pain 12/19/2012  . Diabetes mellitus ([place])   . Diverticulitis   . Dysrhythmia   . GERD (gastroesophageal reflux disease)   . Glaucoma   . Headache    hx migraines  . Heart murmur   . History of shingles   . Hyperlipidemia   . Hypertension   . Numbness    rt side of rt leg  . Post herpetic neuralgia 12/19/2012  . Stroke [facility])    may 2020    Patient Active Problem List   Diagnosis Date Noted  . Acute CVA (cerebrovascular accident) ([place]) 05/17/2018  . CVA (cerebral vascular accident) ([place]) 05/16/2018  . Noncompliance 05/16/2018  . Vitamin D deficiency 05/16/2018  . Abnormal EKG 05/16/2018  . Headache 05/16/2018  . Memory changes 05/13/2018  . Right knee pain 02/12/2017  . Spondylolisthesis, lumbar region 03/02/2016  . CKD (chronic kidney disease), stage III ([place]) 02/16/2016  . Chronic low back pain 07/12/2015  . Bilateral foot pain 01/26/2015  . Colostomy status ([place]) 11/09/2014  . Rash 06/24/2014  . Preventative health care 12/31/2013  . Anxiety state 12/22/2013  . Yeast vaginitis 12/22/2013  .  Diverticulitis of colon with perforation s/p colectomy/ostomy 09/02/2013 09/02/2013  . Post herpetic neuralgia 12/19/2012  . Arthritis, lumbar spine 12/19/2012  . Glaucoma   . A-fib ([place])   . GERD (gastroesophageal reflux disease) 10/06/2012  . DM type 2 (diabetes mellitus, type 2) ([place]) 09/22/2012  . Hypertension 09/22/2012  . Hyperlipemia 09/22/2012    Past Surgical History:  Procedure Laterality Date  . ABDOMINAL HYSTERECTOMY  1964   partial  . COLON RESECTION N/A 09/02/2013   Procedure: [place] colectomy;  Surgeon: Excell Seltzer, MD;  Location: [facility];  Service: General;  Laterality: N/A;  . COLONOSCOPY    . COLOSTOMY TAKEDOWN N/A 11/09/2014   Procedure: TAKEDOWN [place] COLOSTOMY;  Surgeon: Excell Seltzer, MD;  Location: [facility];  Service: General;  Laterality: N/A;  . [place]   left  . LOOP RECORDER INSERTION N/A 05/19/2018   Procedure: LOOP RECORDER INSERTION;  Surgeon: Evans Lance, MD;  Location: [place] CV LAB;  Service: Cardiovascular;  Laterality: N/A;  . POLYPECTOMY       OB History   No obstetric history on file.     Family History  Problem Relation Age of Onset  . Hypertension Mother   . Hypertension Maternal Grandmother   . Colon cancer Neg Hx   . Stomach cancer Neg Hx   . Colon polyps Neg Hx   . Esophageal  cancer Neg Hx   . Rectal cancer Neg Hx     Social History   Tobacco Use  . Smoking status: Former Smoker    Types: Cigarettes    Start date: 09/17/1991    Quit date: 10/31/1997    Years since quitting: 21.6  . Smokeless tobacco: Never Used  Substance Use Topics  . Alcohol use: No  . Drug use: No    Home Medications Prior to Admission medications   Medication Sig Start Date End Date Taking? Authorizing Provider  apixaban (ELIQUIS) 2.5 MG TABS tablet Take 1 tablet (2.5 mg total) by mouth 2 (two) times daily. 01/19/19   Shirley Friar, PA-C  atorvastatin (LIPITOR) 80 MG tablet Take 1 tablet  (80 mg total) by mouth daily at 6 PM. 06/23/18   Frann Rider, NP  blood glucose meter kit and supplies KIT Use up to four times daily as directed. E11.9 05/20/18   Biagio Borg, MD  Blood Glucose Monitoring Suppl (ONE TOUCH ULTRA 2) w/Device KIT USE as directed 05/13/18   Biagio Borg, MD  cephALEXin (KEFLEX) 500 MG capsule Take 1 capsule (500 mg total) by mouth 3 (three) times daily. 01/27/19   Horton, Barbette Hair, MD  diltiazem (CARDIZEM) 120 MG tablet Take 1 tablet (120 mg total) by mouth daily. For heart and blood pressure 05/13/18   Biagio Borg, MD  glipiZIDE (GLUCOTROL XL) 2.5 MG 24 hr tablet Take 1 tablet (2.5 mg total) by mouth daily with breakfast. 04/20/19   Biagio Borg, MD  HYDROcodone-acetaminophen (NORCO/VICODIN) 5-325 MG tablet Take 1 tablet by mouth every 6 (six) hours as needed. 01/27/19   Merryl Hacker, MD  Lancets MISC Use as directed once daily 05/13/18   Biagio Borg, MD  meclizine (ANTIVERT) 12.5 MG tablet Take 1 tablet (12.5 mg total) by mouth 3 (three) times daily as needed for dizziness. 06/18/19   Biagio Borg, MD  metoprolol tartrate (LOPRESSOR) 25 MG tablet Take 1 tablet (25 mg total) by mouth 2 (two) times daily. 05/19/18   Caren Griffins, MD  pantoprazole (PROTONIX) 40 MG tablet Take 1 tablet (40 mg total) by mouth daily. For reflux 05/30/18   Biagio Borg, MD  tiZANidine (ZANAFLEX) 4 MG tablet TAKE 1 TABLET BY MOUTH EVERY 6 HOURS AS NEEDED FOR MUSCLE SPASM Patient taking differently: Take 4 mg by mouth every 6 (six) hours as needed for muscle spasms.  12/19/18   Biagio Borg, MD  Vitamin D, Ergocalciferol, (DRISDOL) 1.25 MG (50000 UT) CAPS capsule Take 1 capsule (50,000 Units total) by mouth every 7 (seven) days. Patient taking differently: Take 50,000 Units by mouth every Tuesday.  07/03/18   Biagio Borg, MD    Allergies    Patient has no known allergies.  Review of Systems   Review of Systems  Constitutional: Negative for fever.  HENT: Positive for  nosebleeds. Negative for congestion, sinus pain, sneezing and sore throat.   Respiratory: Negative for cough.   Cardiovascular: Negative for syncope.  Neurological: Negative for dizziness and headaches.    Physical Exam Updated Vital Signs BP (!) 163/109 (BP Location: Right Arm)   Pulse 93   Temp 98.2 F (36.8 C) (Oral)   Resp 16   SpO2 99%   Physical Exam Vitals and nursing note reviewed.  Constitutional:      General: She is not in acute distress.    Appearance: She is well-developed. She is not diaphoretic.  HENT:  Head: Normocephalic and atraumatic.     Nose: No nasal deformity.     Right Nostril: Epistaxis present.     Comments: Clot removed from the right naris.  Multiple clots fit from the oropharynx.  No active bleeding at this time.  Minimal blood left behind in the right naris removed with unit forceps and cotton swabs.  No active bleeding site visualized. Eyes:     General: No scleral icterus.    Conjunctiva/sclera: Conjunctivae normal.  Cardiovascular:     Rate and Rhythm: Normal rate and regular rhythm.     Heart sounds: Normal heart sounds. No murmur heard.  No friction rub. No gallop.   Pulmonary:     Effort: Pulmonary effort is normal. No respiratory distress.     Breath sounds: Normal breath sounds.  Abdominal:     General: Bowel sounds are normal. There is no distension.     Palpations: Abdomen is soft. There is no mass.     Tenderness: There is no abdominal tenderness. There is no guarding.  Musculoskeletal:     Cervical back: Normal range of motion.  Skin:    General: Skin is warm and dry.  Neurological:     Mental Status: She is alert and oriented to person, place, and time.  Psychiatric:        Behavior: Behavior normal.     ED Results / Procedures / Treatments   Labs (all labs ordered are listed, but only abnormal results are displayed) Labs Reviewed  CBC - Abnormal; Notable for the following components:      Result Value   RBC 3.41  (*)    Hemoglobin 10.8 (*)    HCT 33.4 (*)    All other components within normal limits    EKG None  Radiology No results found.  Procedures Procedures (including critical care time)  Medications Ordered in ED Medications  oxymetazoline (AFRIN) 0.05 % nasal spray 1 spray (1 spray Each Nare Given 06/21/19 0449)    ED Course  I have reviewed the triage vital signs and the nursing notes.  Pertinent labs & imaging results that were available during my care of the patient were reviewed by me and considered in my medical decision making (see chart for details).  Clinical Course as of Jun 21 1010  Sun Jun 21, 2019  0859 Hemoglobin(!): 10.8 [AH]    Clinical Course User Index [AH] Margarita Mail, PA-C   MDM Rules/Calculators/A&P                          86 year old female here with complaint of epistaxis.  Patient has mildly elevated blood pressure.  She has no active bleeding after 1 hour observation in the emergency department.  Afrin given at triage with resolution of bleeding. Discussed home epistaxis management and return precautions.  I reviewed the patient's labs, hemoglobin 10.8 and at baseline.  She is otherwise hemodynamically stable, she is to follow-up outpatient with her ENT doctor, Dr. Wilburn Cornelia.  Patient given nasal hygiene handout. Final Clinical Impression(s) / ED Diagnoses Final diagnoses:  None    Rx / DC Orders ED Discharge Orders    None       Margarita Mail, PA-C 06/21/19 1014    Little, Wenda Overland, MD 06/21/19 1018

## 2019-06-21 NOTE — ED Triage Notes (Addendum)
Pt presents to Ed POv. Pt c/o epitaxis beginning yesterday. Pt states that it stopped for a little while but began again this morning. Pt states hospitalization for same.  Both nares actively bleeding. Affirin given

## 2019-06-21 NOTE — ED Notes (Signed)
Pt is sitting up, no epistaxis noted, no complaints

## 2019-06-22 ENCOUNTER — Ambulatory Visit (INDEPENDENT_AMBULATORY_CARE_PROVIDER_SITE_OTHER): Payer: Medicare HMO | Admitting: *Deleted

## 2019-06-22 DIAGNOSIS — I63432 Cerebral infarction due to embolism of left posterior cerebral artery: Secondary | ICD-10-CM

## 2019-06-22 LAB — CUP PACEART REMOTE DEVICE CHECK
Date Time Interrogation Session: 20210621001836
Implantable Pulse Generator Implant Date: 20200518

## 2019-06-22 NOTE — Progress Notes (Signed)
Carelink Summary Report / Loop Recorder 

## 2019-06-30 ENCOUNTER — Other Ambulatory Visit: Payer: Self-pay

## 2019-06-30 ENCOUNTER — Telehealth: Payer: Self-pay | Admitting: Internal Medicine

## 2019-06-30 ENCOUNTER — Emergency Department (HOSPITAL_COMMUNITY)
Admission: EM | Admit: 2019-06-30 | Discharge: 2019-06-30 | Discharge status: Against Medical Advice | Payer: Medicare HMO | Attending: Emergency Medicine | Admitting: Emergency Medicine

## 2019-06-30 ENCOUNTER — Encounter (HOSPITAL_COMMUNITY): Payer: Self-pay | Admitting: Emergency Medicine

## 2019-06-30 DIAGNOSIS — R040 Epistaxis: Secondary | ICD-10-CM | POA: Diagnosis not present

## 2019-06-30 DIAGNOSIS — Z5321 Procedure and treatment not carried out due to patient leaving prior to being seen by health care provider: Secondary | ICD-10-CM | POA: Insufficient documentation

## 2019-06-30 LAB — CBC
HCT: 31.5 % — ABNORMAL LOW (ref 36.0–46.0)
Hemoglobin: 10.3 g/dL — ABNORMAL LOW (ref 12.0–15.0)
MCH: 32.2 pg (ref 26.0–34.0)
MCHC: 32.7 g/dL (ref 30.0–36.0)
MCV: 98.4 fL (ref 80.0–100.0)
Platelets: 234 10*3/uL (ref 150–400)
RBC: 3.2 MIL/uL — ABNORMAL LOW (ref 3.87–5.11)
RDW: 13.8 % (ref 11.5–15.5)
WBC: 6.5 10*3/uL (ref 4.0–10.5)
nRBC: 0 % (ref 0.0–0.2)

## 2019-06-30 LAB — PROTIME-INR
INR: 1.2 (ref 0.8–1.2)
Prothrombin Time: 14.4 seconds (ref 11.4–15.2)

## 2019-06-30 MED ORDER — OXYMETAZOLINE HCL 0.05 % NA SOLN: 1.0000 | Freq: Once | NASAL | Status: DC

## 2019-06-30 NOTE — Telephone Encounter (Signed)
Per [place] states the patient has had a nosebleed since 8am and is on blood thinners. It stopped for about 30 minutes after giving a nasal spray and packing it with gauze/pinching her nose. Right nostril has started bleeding again, left nostril has stopped. Lightheaded. No nose injury. She is on blood thinners. Went to ED last week for nosebleed.  Advised to go to ED now. Patient currently in [facility].

## 2019-06-30 NOTE — ED Triage Notes (Signed)
Pt in w/epistaxis since 0500 this am. Reports bleeding has slowed throughout day, but she has been hospitalized for this in past. Takes Eliquis, last dose last night. Denies any weakness/dizziness

## 2019-06-30 NOTE — Telephone Encounter (Signed)
   Family member calling to report patient nose bleeding since around 8am Currently taking blood thinner Call Transferred to [facility]

## 2019-07-13 ENCOUNTER — Encounter: Payer: Self-pay | Admitting: Internal Medicine

## 2019-07-13 ENCOUNTER — Other Ambulatory Visit: Payer: Self-pay

## 2019-07-13 ENCOUNTER — Ambulatory Visit (INDEPENDENT_AMBULATORY_CARE_PROVIDER_SITE_OTHER): Payer: Medicare HMO | Admitting: Internal Medicine

## 2019-07-13 VITALS — BP 150/96 | HR 60 | Temp 98.9°F | Ht 62.0 in

## 2019-07-13 DIAGNOSIS — E1159 Type 2 diabetes mellitus with other circulatory complications: Secondary | ICD-10-CM

## 2019-07-13 DIAGNOSIS — N183 Chronic kidney disease, stage 3 unspecified: Secondary | ICD-10-CM | POA: Diagnosis not present

## 2019-07-13 DIAGNOSIS — R040 Epistaxis: Secondary | ICD-10-CM | POA: Diagnosis not present

## 2019-07-13 DIAGNOSIS — M545 Low back pain, unspecified: Secondary | ICD-10-CM

## 2019-07-13 DIAGNOSIS — I10 Essential (primary) hypertension: Secondary | ICD-10-CM

## 2019-07-13 DIAGNOSIS — E785 Hyperlipidemia, unspecified: Secondary | ICD-10-CM

## 2019-07-13 DIAGNOSIS — M79641 Pain in right hand: Secondary | ICD-10-CM

## 2019-07-13 DIAGNOSIS — M25531 Pain in right wrist: Secondary | ICD-10-CM | POA: Diagnosis not present

## 2019-07-13 DIAGNOSIS — I482 Chronic atrial fibrillation, unspecified: Secondary | ICD-10-CM

## 2019-07-13 DIAGNOSIS — G8929 Other chronic pain: Secondary | ICD-10-CM

## 2019-07-13 MED ORDER — GABAPENTIN 100 MG PO CAPS
100.0000 mg | ORAL_CAPSULE | Freq: Three times a day (TID) | ORAL | 5 refills | Status: AC
Start: 2019-07-13 — End: ?

## 2019-07-13 NOTE — Assessment & Plan Note (Signed)
stable overall by history and exam, recent data reviewed with pt, and pt to continue medical treatment as before,  to f/u any worsening symptoms or concerns  

## 2019-07-13 NOTE — Progress Notes (Signed)
Subjective:    Patient ID: Kimberly Castillo, female    DOB: April 05, 1933, 86 y.o.   MRN: [id number]  HPI  Here after epistaxis episode x 3 , now for ENT possibly on July 19.  Sister told her to stop her eliquis for about 1 wk.  No further bleeding and does not want to restart until sees ENT.  Pt denies chest pain, increasing sob or doe, wheezing, orthopnea, PND, increased LE swelling, palpitations, dizziness or syncope.  Pt denies new neurological symptoms such as new headache, or facial or extremity weakness or numbness.  Pt denies polydipsia, polyuria, or low sugar episode.  Pt states overall good compliance with meds,  Also c/o pain to the right medial wrist without swelling but sharp, mod to severe occaional, intermittent, worse ot use the hand and wrist better to not do this.  Also has 4th and 5th fingers right hand pain with arthritis but also hard to flex completely  Pt continues to have recurring LBP without change in severity, bowel or bladder change, fever, wt loss,  worsening LE pain/numbness/weakness, gait change or falls has known lubmar stenosis and multi foraminal stenosis with MRI 2017, declines f/u MRI or ortho for now as she does not want ESI shots.   Past Medical History:  Diagnosis Date  . A-fib ([place])   . Anxiety state 12/22/2013  . Arthritis    hands, back  . Arthritis, lumbar spine 12/19/2012  . Cataracts, bilateral   . Chronic lower back pain 12/19/2012  . Diabetes mellitus ([place])   . Diverticulitis   . Dysrhythmia   . GERD (gastroesophageal reflux disease)   . Glaucoma   . Headache    hx migraines  . Heart murmur   . History of shingles   . Hyperlipidemia   . Hypertension   . Numbness    rt side of rt leg  . Post herpetic neuralgia 12/19/2012  . Stroke [facility])    may 2020   Past Surgical History:  Procedure Laterality Date  . ABDOMINAL HYSTERECTOMY  1964   partial  . COLON RESECTION N/A 09/02/2013   Procedure: [place] colectomy;  Surgeon: Excell Seltzer, MD;   [facility];  Service: General;  Laterality: N/A;  . COLONOSCOPY    . COLOSTOMY TAKEDOWN N/A 11/09/2014   Procedure: TAKEDOWN [place] COLOSTOMY;  Surgeon: Excell Seltzer, MD;  Location[facility];  Service: General;  Laterality: N/A;  . [place]   left  . LOOP RECORDER INSERTION N/A 05/19/2018   Procedure: LOOP RECORDER INSERTION;  Surgeon: Evans Lance, MD;  Location: [place] CV LAB;  Service: Cardiovascular;  Laterality: N/A;  . POLYPECTOMY      reports that she quit smoking about 21 years ago. Her smoking use included cigarettes. She started smoking about 27 years ago. She has never used smokeless tobacco. She reports that she does not drink alcohol and does not use drugs. family history includes Hypertension in her maternal grandmother and mother. No Known Allergies Current Outpatient Medications on File Prior to Visit  Medication Sig Dispense Refill  . apixaban (ELIQUIS) 2.5 MG TABS tablet Take 1 tablet (2.5 mg total) by mouth 2 (two) times daily. 60 tablet 5  . atorvastatin (LIPITOR) 80 MG tablet Take 1 tablet (80 mg total) by mouth daily at 6 PM. 90 tablet 3  . blood glucose meter kit and supplies KIT Use up to four times daily as directed. E11.9 1 each 0  .  Blood Glucose Monitoring Suppl (ONE TOUCH ULTRA 2) w/Device KIT USE as directed 1 each 0  . cephALEXin (KEFLEX) 500 MG capsule Take 1 capsule (500 mg total) by mouth 3 (three) times daily. 20 capsule 0  . diltiazem (CARDIZEM CD) 240 MG 24 hr capsule Take 240 mg by mouth daily.    [name] glipiZIDE (GLUCOTROL XL) 2.5 MG 24 hr tablet Take 1 tablet (2.5 mg total) by mouth daily with breakfast. 90 tablet 3  . HYDROcodone-acetaminophen (NORCO/VICODIN) 5-325 MG tablet Take 1 tablet by mouth every 6 (six) hours as needed. 10 tablet 0  . Lancets MISC Use as directed once daily 100 each 11  . meclizine (ANTIVERT) 12.5 MG tablet Take 1 tablet (12.5 mg total) by mouth 3 (three) times daily as needed for  dizziness. 30 tablet 0  . metFORMIN (GLUCOPHAGE) 500 MG tablet Take by mouth.    . metoprolol tartrate (LOPRESSOR) 25 MG tablet Take 1 tablet (25 mg total) by mouth 2 (two) times daily. 60 tablet 1  . pantoprazole (PROTONIX) 40 MG tablet Take 1 tablet (40 mg total) by mouth daily. For reflux 90 tablet 0  . tiZANidine (ZANAFLEX) 4 MG tablet TAKE 1 TABLET BY MOUTH EVERY 6 HOURS AS NEEDED FOR MUSCLE SPASM (Patient taking differently: Take 4 mg by mouth every 6 (six) hours as needed for muscle spasms. ) 40 tablet 0   No current facility-administered medications on file prior to visit.   Review of Systems All otherwise neg per pt     Objective:   Physical Exam BP (!) 150/96 (BP Location: Left Arm, Patient Position: Sitting, Cuff Size: Large)   Pulse 60   Temp 98.9 F (37.2 C) (Oral)   Ht _0  (1.575 m)   SpO2 97%   BMI 26.53 kg/m  VS noted,  Constitutional: Pt appears in NAD HENT: Head: NCAT.  Right Ear: External ear normal.  Left Ear: External ear normal.  Eyes: . Pupils are equal, round, and reactive to light. Conjunctivae and EOM are normal Nose: without d/c or deformity Neck: Neck supple. Gross normal ROM Cardiovascular: Normal rate and regular rhythm.   Pulmonary/Chest: Effort normal and breath sounds without rales or wheezing.  Abd:  Soft, NT, ND, + BS, no organomegaly Right wrist without swelilng or tenderness Neurological: Pt is alert. At baseline orientation, motor grossly intact Skin: Skin is warm. No rashes, other new lesions, no LE edema Psychiatric: Pt behavior is normal without agitation  All otherwise neg per pt Lab Results  Component Value Date   WBC 6.5 06/30/2019   HGB 10.3 (L) 06/30/2019   HCT 31.5 (L) 06/30/2019   PLT 234 06/30/2019   GLUCOSE 107 (H) 01/13/2019   CHOL 203 (H) 01/13/2019   TRIG 196.0 (H) 01/13/2019   HDL 49.40 01/13/2019   LDLDIRECT 129.0 02/02/2016   LDLCALC 115 (H) 01/13/2019   ALT 9 01/13/2019   AST 13 01/13/2019   NA 140  01/13/2019   K 3.4 (L) 01/13/2019   CL 106 01/13/2019   CREATININE 2.09 (H) 01/13/2019   BUN 40 (H) 01/13/2019   CO2 25 01/13/2019   TSH 1.50 01/13/2019   INR 1.2 06/30/2019   HGBA1C 5.7 01/13/2019   MICROALBUR 92.2 (H) 01/15/2019          Assessment & Plan:

## 2019-07-13 NOTE — Assessment & Plan Note (Signed)
Pt declines further anticoagulation until seen per ENT

## 2019-07-13 NOTE — Assessment & Plan Note (Signed)
Stable rate and volume, pt request cards f/u

## 2019-07-13 NOTE — Assessment & Plan Note (Signed)
Ongoing mild worse recently. 2017 MRI reviewed with pt, exam stable, declines ortho f/u or MRI for now, to start gabapentin 100 tid, back brac prn

## 2019-07-13 NOTE — Assessment & Plan Note (Addendum)
Ok for volt gel, and f/u hand surgury  I spent 41 minutes in preparing to see the patient by review of recent labs, imaging and procedures, obtaining and reviewing separately obtained history, communicating with the patient and family or caregiver, ordering medications, tests or procedures, and documenting clinical information in the EHR including the differential Dx, treatment, and any further evaluation and other management of right wrist pain, ckd, dm, nosebleeds, hld, htn, low back pain, afib

## 2019-07-13 NOTE — Patient Instructions (Addendum)
Your A1c blood test was good today  Please take the Voltaren gel for the right wrist; You will be contacted regarding the referral for: Hand Surgury as well  Please take all new medication as prescribed  - the gabapentin 100 mg three times per day for back pain  Please call in 1 week if you would like the higher dose of gabapentin such as 200 or 300 mg  Ok to try the back brace  Please call if you would like referral back to Dr Lorin Mercy and/or MRI for the lower back  Please continue all other medications as before, and refills have been done if requested.  Please have the pharmacy call with any other refills you may need.  Please continue your efforts at being more active, low cholesterol diet, and weight control.  You will be contacted regarding the referral for: cardiology for the atrial fib  Please keep your appointments with your specialists as you may have planned - ENT on July 19 hopefully, and restart the eliquis asap  Please make an Appointment to return in 6 months, or sooner if needed

## 2019-07-14 ENCOUNTER — Other Ambulatory Visit: Payer: Self-pay

## 2019-07-14 MED ORDER — DILTIAZEM HCL ER COATED BEADS 240 MG PO CP24: 240.0000 mg | ORAL_CAPSULE | Freq: Every day | ORAL | 1 refills | Status: AC

## 2019-07-14 NOTE — Telephone Encounter (Signed)
1.Medication Requested:diltiazem (CARDIZEM CD) 240 MG 24 hr capsule  2. Pharmacy (Name, Street, [place]):[facility]  3. On Med List: No   4. Last Visit with PCP: 7.12.21   5. Next visit date with PCP: n/a   Agent: Please be advised that RX refills may take up to 3 business days. We ask that you follow-up with your pharmacy.

## 2019-07-21 ENCOUNTER — Ambulatory Visit (INDEPENDENT_AMBULATORY_CARE_PROVIDER_SITE_OTHER): Payer: Medicare HMO | Admitting: Internal Medicine

## 2019-07-21 ENCOUNTER — Other Ambulatory Visit: Payer: Self-pay

## 2019-07-21 ENCOUNTER — Encounter: Payer: Self-pay | Admitting: Internal Medicine

## 2019-07-21 VITALS — BP 150/70 | HR 56 | Ht 62.0 in | Wt 147.6 lb

## 2019-07-21 DIAGNOSIS — I480 Paroxysmal atrial fibrillation: Secondary | ICD-10-CM

## 2019-07-21 NOTE — Patient Instructions (Addendum)
Medication Instructions:  Your physician recommends that you continue on your current medications as directed. Please refer to the Current Medication list given to you today.  *If you need a refill on your cardiac medications before your next appointment, please call your pharmacy*  Lab Work: None ordered.  If you have labs (blood work) drawn today and your tests are completely normal, you will receive your results only by: . MyChart Message (if you have MyChart) OR . A paper copy in the mail If you have any lab test that is abnormal or we need to change your treatment, we will call you to review the results.  Testing/Procedures: None ordered.  Follow-Up: At [facility], you and your health needs are our priority.  As part of our continuing mission to provide you with exceptional heart care, we have created designated Provider Care Teams.  These Care Teams include your primary Cardiologist (physician) and Advanced Practice Providers (APPs -  Physician Assistants and Nurse Practitioners) who all work together to provide you with the care you need, when you need it.  We recommend signing up for the patient portal called "MyChart".  Sign up information is provided on this After Visit Summary.  MyChart is used to connect with patients for Virtual Visits (Telemedicine).  Patients are able to view lab/test results, encounter notes, upcoming appointments, etc.  Non-urgent messages can be sent to your provider as well.   To learn more about what you can do with MyChart, go to [email or web address].    Your next appointment:   Your physician wants you to follow-up in: 6 months with Dr. Taylor. You will receive a reminder letter in the mail two months in advance. If you don't receive a letter, please call our office to schedule the follow-up appointment.    Other Instructions:  

## 2019-07-21 NOTE — Progress Notes (Signed)
HPI Kimberly Castillo returns today for followup. She has a h/o cryptogenic stroke and is s/p ILR insertion. In the interim, she notes that she has felt well. She has had some problems with epistaxis and has had to come off of her eliquis. No bleeding since then. She is pending a visit with her ENT. She denies chest pain or sob.  No Known Allergies   Current Outpatient Medications  Medication Sig Dispense Refill  . atorvastatin (LIPITOR) 80 MG tablet Take 1 tablet (80 mg total) by mouth daily at 6 PM. 90 tablet 3  . blood glucose meter kit and supplies KIT Use up to four times daily as directed. E11.9 1 each 0  . Blood Glucose Monitoring Suppl (ONE TOUCH ULTRA 2) w/Device KIT USE as directed 1 each 0  . cephALEXin (KEFLEX) 500 MG capsule Take 1 capsule (500 mg total) by mouth 3 (three) times daily. 20 capsule 0  . diltiazem (CARDIZEM CD) 240 MG 24 hr capsule Take 1 capsule (240 mg total) by mouth daily. Take 240 mg by mouth daily. 90 capsule 1  . gabapentin (NEURONTIN) 100 MG capsule Take 1 capsule (100 mg total) by mouth 3 (three) times daily. 90 capsule 5  . glipiZIDE (GLUCOTROL XL) 2.5 MG 24 hr tablet Take 1 tablet (2.5 mg total) by mouth daily with breakfast. 90 tablet 3  . HYDROcodone-acetaminophen (NORCO/VICODIN) 5-325 MG tablet Take 1 tablet by mouth every 6 (six) hours as needed. 10 tablet 0  . Lancets MISC Use as directed once daily 100 each 11  . meclizine (ANTIVERT) 12.5 MG tablet Take 1 tablet (12.5 mg total) by mouth 3 (three) times daily as needed for dizziness. 30 tablet 0  . metFORMIN (GLUCOPHAGE) 500 MG tablet Take by mouth.    . metoprolol tartrate (LOPRESSOR) 25 MG tablet Take 1 tablet (25 mg total) by mouth 2 (two) times daily. 60 tablet 1  . pantoprazole (PROTONIX) 40 MG tablet Take 1 tablet (40 mg total) by mouth daily. For reflux 90 tablet 0  . tiZANidine (ZANAFLEX) 4 MG tablet TAKE 1 TABLET BY MOUTH EVERY 6 HOURS AS NEEDED FOR MUSCLE SPASM (Patient taking differently:  Take 4 mg by mouth every 6 (six) hours as needed for muscle spasms. ) 40 tablet 0   No current facility-administered medications for this visit.     Past Medical History:  Diagnosis Date  . A-fib ([place])   . Anxiety state 12/22/2013  . Arthritis    hands, back  . Arthritis, lumbar spine 12/19/2012  . Cataracts, bilateral   . Chronic lower back pain 12/19/2012  . Diabetes mellitus ([place])   . Diverticulitis   . Dysrhythmia   . GERD (gastroesophageal reflux disease)   . Glaucoma   . Headache    hx migraines  . Heart murmur   . History of shingles   . Hyperlipidemia   . Hypertension   . Numbness    rt side of rt leg  . Post herpetic neuralgia 12/19/2012  . Stroke ([place])    may 2020    ROS:   All systems reviewed and negative except as noted in the HPI.   Past Surgical History:  Procedure Laterality Date  . ABDOMINAL HYSTERECTOMY  1964   partial  . COLON RESECTION N/A 09/02/2013   Procedure: [place] colectomy;  Surgeon: Excell Seltzer, MD;  [facility];  Service: General;  Laterality: N/A;  . COLONOSCOPY    . COLOSTOMY TAKEDOWN N/A 11/09/2014  Procedure: TAKEDOWN [place] COLOSTOMY;  Surgeon: Excell Seltzer, MD;  [facility];  Service: General;  Laterality: N/A;  . [place]   left  . LOOP RECORDER INSERTION N/A 05/19/2018   Procedure: LOOP RECORDER INSERTION;  Surgeon: Evans Lance, MD;  Location: [facility];  Service: Cardiovascular;  Laterality: N/A;  . POLYPECTOMY       Family History  Problem Relation Age of Onset  . Hypertension Mother   . Hypertension Maternal Grandmother   . Colon cancer Neg Hx   . Stomach cancer Neg Hx   . Colon polyps Neg Hx   . Esophageal cancer Neg Hx   . Rectal cancer Neg Hx      Social History   Socioeconomic History  . Marital status: Widowed    Spouse name: Not on file  . Number of children: Not on file  . Years of education: Not on file  . Highest education level:  Not on file  Occupational History  . Not on file  Tobacco Use  . Smoking status: Former Smoker    Types: Cigarettes    Start date: 09/17/1991    Quit date: 10/31/1997    Years since quitting: 21.7  . Smokeless tobacco: Never Used  Substance and Sexual Activity  . Alcohol use: No  . Drug use: No  . Sexual activity: Yes  Other Topics Concern  . Not on file  Social History Narrative  . Not on file   Social Determinants of Health   Financial Resource Strain:   . Difficulty of Paying Living Expenses:   Food Insecurity:   . Worried About Charity fundraiser in the Last Year:   . Arboriculturist in the Last Year:   Transportation Needs:   . Film/video editor (Medical):   [name] Lack of Transportation (Non-Medical):   Physical Activity:   . Days of Exercise per Week:   . Minutes of Exercise per Session:   Stress:   . Feeling of Stress :   Social Connections:   . Frequency of Communication with Friends and Family:   . Frequency of Social Gatherings with Friends and Family:   . Attends Religious Services:   . Active Member of Clubs or Organizations:   . Attends Archivist Meetings:   [name] Marital Status:   Intimate Partner Violence:   . Fear of Current or Ex-Partner:   . Emotionally Abused:   [name] Physically Abused:   . Sexually Abused:      BP (!) 150/70   Pulse (!) 56   Ht 5' 2" (1.575 m)   Wt 147 lb 9.6 oz (67 kg)   SpO2 95%   BMI 27.00 kg/m   Physical Exam:  Well appearing NAD HEENT: Unremarkable Neck:  No JVD, no thyromegally Lymphatics:  No adenopathy Back:  No CVA tenderness Lungs:  Clear with no wheezes HEART:  Regular rate rhythm, no murmurs, no rubs, no clicks Abd:  soft, positive bowel sounds, no organomegally, no rebound, no guarding Ext:  2 plus pulses, no edema, no cyanosis, no clubbing Skin:  No rashes no nodules Neuro:  CN II through XII intact, motor grossly intact  EKG - sinus brady with PAC's  DEVICE  Normal device function.  See  PaceArt for details.   Assess/Plan: 1. PAF - she is mostly maintaining NSR.  2. Epistaxis - she is pending ENT followup. Hopefully she can be treated so that she  can safely restart her [place]. 3. Stroke - she has minimal residual.  4. HTN - her sbp is slightly elevated. No change in her meds currently.   Salome Spotted.

## 2019-07-27 ENCOUNTER — Ambulatory Visit (INDEPENDENT_AMBULATORY_CARE_PROVIDER_SITE_OTHER): Payer: Medicare HMO | Admitting: *Deleted

## 2019-07-27 DIAGNOSIS — I63432 Cerebral infarction due to embolism of left posterior cerebral artery: Secondary | ICD-10-CM

## 2019-07-27 LAB — CUP PACEART REMOTE DEVICE CHECK
Date Time Interrogation Session: 20210725233407
Implantable Pulse Generator Implant Date: 20200518

## 2019-07-28 NOTE — Progress Notes (Signed)
Carelink Summary Report / Loop Recorder 

## 2019-07-31 ENCOUNTER — Ambulatory Visit (INDEPENDENT_AMBULATORY_CARE_PROVIDER_SITE_OTHER): Payer: Medicare HMO

## 2019-07-31 ENCOUNTER — Other Ambulatory Visit: Payer: Self-pay | Admitting: Internal Medicine

## 2019-07-31 ENCOUNTER — Ambulatory Visit: Payer: Medicare HMO | Admitting: Orthopaedic Surgery

## 2019-07-31 DIAGNOSIS — M25531 Pain in right wrist: Secondary | ICD-10-CM | POA: Diagnosis not present

## 2019-07-31 MED ORDER — PREDNISONE 10 MG (21) PO TBPK
ORAL_TABLET | ORAL | 0 refills | Status: DC
Start: 2019-07-31 — End: 2020-02-09

## 2019-07-31 NOTE — Progress Notes (Signed)
Office Visit Note   Patient: Kimberly Castillo           Date of Birth: 1933/01/13           MRN: [id number] Visit Date: 07/31/2019              Requested by: Biagio Borg, MD [address] [place],  [place] [address] PCP: Biagio Borg, MD   Assessment & Plan: Visit Diagnoses:  1. Pain in right wrist     Plan: Impression is right wrist osteoarthritis.  Based on our discussion of treatment options she would like to try a prednisone Dosepak and she will continue to wear the wrist brace she has.  She declined cortisone injection today.  Questions encouraged and answered.  Follow-Up Instructions: Return if symptoms worsen or fail to improve.   Orders:  Orders Placed This Encounter  Procedures  . XR Wrist Complete Right   Meds ordered this encounter  Medications  . predniSONE (STERAPRED UNI-PAK 21 TAB) 10 MG (21) TBPK tablet    Sig: Take as directed    Dispense:  21 tablet    Refill:  0      Procedures: No procedures performed   Clinical Data: No additional findings.   Subjective: Chief Complaint  Patient presents with  . Right Wrist - Pain    Kimberly Castillo is a 86 year old female who is coming in for evaluation of a new problem of right wrist pain for few weeks.  Denies any injuries or numbness and tingling.  It hurts throughout the wrist.  The pain is worse with the use of the hand and wrist.  She is on Eliquis and has chronic kidney disease.   Review of Systems  Constitutional: Negative.   HENT: Negative.   Eyes: Negative.   Respiratory: Negative.   Cardiovascular: Negative.   Endocrine: Negative.   Musculoskeletal: Negative.   Neurological: Negative.   Hematological: Negative.   Psychiatric/Behavioral: Negative.   All other systems reviewed and are negative.    Objective: Vital Signs: There were no vitals taken for this visit.  Physical Exam Vitals and nursing note reviewed.  Constitutional:      Appearance: She is well-developed.  Pulmonary:      Effort: Pulmonary effort is normal.  Skin:    General: Skin is warm.     Capillary Refill: Capillary refill takes less than 2 seconds.  Neurological:     Mental Status: She is alert and oriented to person, place, and time.  Psychiatric:        Behavior: Behavior normal.        Thought Content: Thought content normal.        Judgment: Judgment normal.     Ortho Exam Right wrist shows no swelling or temperature changes.  No focal tenderness.  Generalized discomfort with wrist range of motion. Specialty Comments:  No specialty comments available.  Imaging: XR Wrist Complete Right  Result Date: 07/31/2019 Moderate degenerative changes of the radiocarpal joint.  Questionable old ulnar styloid fracture.    PMFS History: Patient Active Problem List   Diagnosis Date Noted  . Frequent nosebleeds 07/13/2019  . Right wrist pain 07/13/2019  . Acute CVA (cerebrovascular accident) ([place]) 05/17/2018  . CVA (cerebral vascular accident) ([place]) 05/16/2018  . Noncompliance 05/16/2018  . Vitamin D deficiency 05/16/2018  . Abnormal EKG 05/16/2018  . Headache 05/16/2018  . Memory changes 05/13/2018  . Right knee pain 02/12/2017  . Spondylolisthesis, lumbar region 03/02/2016  . CKD (  chronic kidney disease), stage III ([place]) 02/16/2016  . Chronic low back pain 07/12/2015  . Bilateral foot pain 01/26/2015  . Colostomy status ([place]) 11/09/2014  . Rash 06/24/2014  . Preventative health care 12/31/2013  . Anxiety state 12/22/2013  . Yeast vaginitis 12/22/2013  . Diverticulitis of colon with perforation s/p colectomy/ostomy 09/02/2013 09/02/2013  . Post herpetic neuralgia 12/19/2012  . Arthritis, lumbar spine 12/19/2012  . Glaucoma   . A-fib ([place])   . GERD (gastroesophageal reflux disease) 10/06/2012  . DM type 2 (diabetes mellitus, type 2) ([place]) 09/22/2012  . Hypertension 09/22/2012  . Hyperlipemia 09/22/2012   Past Medical History:  Diagnosis Date  . A-fib ([place])   . Anxiety state  12/22/2013  . Arthritis    hands, back  . Arthritis, lumbar spine 12/19/2012  . Cataracts, bilateral   . Chronic lower back pain 12/19/2012  . Diabetes mellitus ([place])   . Diverticulitis   . Dysrhythmia   . GERD (gastroesophageal reflux disease)   . Glaucoma   . Headache    hx migraines  . Heart murmur   . History of shingles   . Hyperlipidemia   . Hypertension   . Numbness    rt side of rt leg  . Post herpetic neuralgia 12/19/2012  . Stroke [facility])    may 2020    Family History  Problem Relation Age of Onset  . Hypertension Mother   . Hypertension Maternal Grandmother   . Colon cancer Neg Hx   . Stomach cancer Neg Hx   . Colon polyps Neg Hx   . Esophageal cancer Neg Hx   . Rectal cancer Neg Hx     Past Surgical History:  Procedure Laterality Date  . ABDOMINAL HYSTERECTOMY  1964   partial  . COLON RESECTION N/A 09/02/2013   Procedure: [place] colectomy;  Surgeon: Excell Seltzer, MD;  [facility];  Service: General;  Laterality: N/A;  . COLONOSCOPY    . COLOSTOMY TAKEDOWN N/A 11/09/2014   Procedure: TAKEDOWN [place] COLOSTOMY;  Surgeon: Excell Seltzer, MD;  [facility];  Service: General;  Laterality: N/A;  . [place]   left  . LOOP RECORDER INSERTION N/A 05/19/2018   Procedure: LOOP RECORDER INSERTION;  Surgeon: Evans Lance, MD;  Location: [facility];  Service: Cardiovascular;  Laterality: N/A;  . POLYPECTOMY     Social History   Occupational History  . Not on file  Tobacco Use  . Smoking status: Former Smoker    Types: Cigarettes    Start date: 09/17/1991    Quit date: 10/31/1997    Years since quitting: 21.7  . Smokeless tobacco: Never Used  Substance and Sexual Activity  . Alcohol use: No  . Drug use: No  . Sexual activity: Yes

## 2019-08-07 NOTE — Addendum Note (Signed)
Addended by: Janan Halter F on: 08/07/2019 12:54 PM   Modules accepted: Orders

## 2019-08-12 NOTE — Addendum Note (Signed)
Addended by: Maren Beach, Tamey Wanek A on: 08/12/2019 09:09 AM   Modules accepted: Orders

## 2019-08-26 DIAGNOSIS — D631 Anemia in chronic kidney disease: Secondary | ICD-10-CM | POA: Diagnosis not present

## 2019-08-26 DIAGNOSIS — N184 Chronic kidney disease, stage 4 (severe): Secondary | ICD-10-CM | POA: Diagnosis not present

## 2019-08-26 DIAGNOSIS — E213 Hyperparathyroidism, unspecified: Secondary | ICD-10-CM | POA: Diagnosis not present

## 2019-09-01 DIAGNOSIS — J342 Deviated nasal septum: Secondary | ICD-10-CM | POA: Diagnosis not present

## 2019-09-01 DIAGNOSIS — R040 Epistaxis: Secondary | ICD-10-CM | POA: Diagnosis not present

## 2019-09-03 ENCOUNTER — Other Ambulatory Visit: Payer: Self-pay | Admitting: Internal Medicine

## 2019-09-04 DIAGNOSIS — E213 Hyperparathyroidism, unspecified: Secondary | ICD-10-CM | POA: Diagnosis not present

## 2019-09-04 DIAGNOSIS — R809 Proteinuria, unspecified: Secondary | ICD-10-CM | POA: Diagnosis not present

## 2019-09-04 DIAGNOSIS — N184 Chronic kidney disease, stage 4 (severe): Secondary | ICD-10-CM | POA: Diagnosis not present

## 2019-09-04 DIAGNOSIS — D631 Anemia in chronic kidney disease: Secondary | ICD-10-CM | POA: Diagnosis not present

## 2019-09-04 DIAGNOSIS — E278 Other specified disorders of adrenal gland: Secondary | ICD-10-CM | POA: Diagnosis not present

## 2019-09-04 DIAGNOSIS — E1122 Type 2 diabetes mellitus with diabetic chronic kidney disease: Secondary | ICD-10-CM | POA: Diagnosis not present

## 2019-09-04 DIAGNOSIS — I4891 Unspecified atrial fibrillation: Secondary | ICD-10-CM | POA: Diagnosis not present

## 2019-09-04 DIAGNOSIS — R42 Dizziness and giddiness: Secondary | ICD-10-CM | POA: Diagnosis not present

## 2019-09-04 DIAGNOSIS — I129 Hypertensive chronic kidney disease with stage 1 through stage 4 chronic kidney disease, or unspecified chronic kidney disease: Secondary | ICD-10-CM | POA: Diagnosis not present

## 2019-09-25 DIAGNOSIS — N184 Chronic kidney disease, stage 4 (severe): Secondary | ICD-10-CM | POA: Diagnosis not present

## 2019-10-03 ENCOUNTER — Other Ambulatory Visit: Payer: Self-pay | Admitting: Internal Medicine

## 2019-10-08 DIAGNOSIS — N184 Chronic kidney disease, stage 4 (severe): Secondary | ICD-10-CM | POA: Diagnosis not present

## 2019-11-10 ENCOUNTER — Other Ambulatory Visit: Payer: Self-pay

## 2019-11-10 ENCOUNTER — Other Ambulatory Visit: Payer: Self-pay | Admitting: Internal Medicine

## 2019-11-23 DIAGNOSIS — N184 Chronic kidney disease, stage 4 (severe): Secondary | ICD-10-CM | POA: Diagnosis not present

## 2019-11-30 DIAGNOSIS — E875 Hyperkalemia: Secondary | ICD-10-CM | POA: Diagnosis not present

## 2019-12-02 DIAGNOSIS — I120 Hypertensive chronic kidney disease with stage 5 chronic kidney disease or end stage renal disease: Secondary | ICD-10-CM | POA: Diagnosis not present

## 2019-12-02 DIAGNOSIS — R413 Other amnesia: Secondary | ICD-10-CM | POA: Diagnosis not present

## 2019-12-02 DIAGNOSIS — D631 Anemia in chronic kidney disease: Secondary | ICD-10-CM | POA: Diagnosis not present

## 2019-12-02 DIAGNOSIS — R809 Proteinuria, unspecified: Secondary | ICD-10-CM | POA: Diagnosis not present

## 2019-12-02 DIAGNOSIS — N185 Chronic kidney disease, stage 5: Secondary | ICD-10-CM | POA: Diagnosis not present

## 2019-12-02 DIAGNOSIS — E875 Hyperkalemia: Secondary | ICD-10-CM | POA: Diagnosis not present

## 2019-12-02 DIAGNOSIS — E1122 Type 2 diabetes mellitus with diabetic chronic kidney disease: Secondary | ICD-10-CM | POA: Diagnosis not present

## 2019-12-02 DIAGNOSIS — E213 Hyperparathyroidism, unspecified: Secondary | ICD-10-CM | POA: Diagnosis not present

## 2019-12-02 DIAGNOSIS — I4891 Unspecified atrial fibrillation: Secondary | ICD-10-CM | POA: Diagnosis not present

## 2019-12-31 ENCOUNTER — Other Ambulatory Visit: Payer: Self-pay | Admitting: Internal Medicine

## 2020-01-05 DIAGNOSIS — N185 Chronic kidney disease, stage 5: Secondary | ICD-10-CM | POA: Diagnosis not present

## 2020-01-12 DIAGNOSIS — E278 Other specified disorders of adrenal gland: Secondary | ICD-10-CM | POA: Diagnosis not present

## 2020-01-12 DIAGNOSIS — E875 Hyperkalemia: Secondary | ICD-10-CM | POA: Diagnosis not present

## 2020-01-12 DIAGNOSIS — D631 Anemia in chronic kidney disease: Secondary | ICD-10-CM | POA: Diagnosis not present

## 2020-01-12 DIAGNOSIS — N185 Chronic kidney disease, stage 5: Secondary | ICD-10-CM | POA: Diagnosis not present

## 2020-01-12 DIAGNOSIS — I4891 Unspecified atrial fibrillation: Secondary | ICD-10-CM | POA: Diagnosis not present

## 2020-01-12 DIAGNOSIS — R809 Proteinuria, unspecified: Secondary | ICD-10-CM | POA: Diagnosis not present

## 2020-01-12 DIAGNOSIS — I120 Hypertensive chronic kidney disease with stage 5 chronic kidney disease or end stage renal disease: Secondary | ICD-10-CM | POA: Diagnosis not present

## 2020-01-12 DIAGNOSIS — E1122 Type 2 diabetes mellitus with diabetic chronic kidney disease: Secondary | ICD-10-CM | POA: Diagnosis not present

## 2020-01-12 DIAGNOSIS — E213 Hyperparathyroidism, unspecified: Secondary | ICD-10-CM | POA: Diagnosis not present

## 2020-01-25 ENCOUNTER — Encounter: Payer: Medicare HMO | Admitting: Internal Medicine

## 2020-02-07 NOTE — Progress Notes (Signed)
Cardiology Office Note Date:  02/09/2020  Patient ID:  Kimberly Castillo, DOB 1933/03/23, MRN [id number] PCP:  Biagio Borg, MD  Electrophysiologist: Dr. Lovena Le    Chief Complaint: device flollow up  History of Present Illness: Kimberly Castillo is a 87 y.o. female with history of HTN, HLD, DM, stroke  > loop >> Afib  She comes in today to be seen for dr. Lovena Le, last seen by him July 2021, at that time mentions off her Eliquis 2/2 epistaxis, pending ENT follow up.  TODAY She comes today accompanied by her sone. She is in a wheelchair though only when having to walk long durations/office buildings, unfamiliar places,. She reports ambulate isn her home well, denies falls, but since her stroke a bit dizzy generally. No CP or palpitations, denies any cardiac awareness. No SOB NO near syncope or syncope.  She remains off Eliquis. Her son reports that the ENT found no abnormalities and felt the Eliquis was the cause of her bleeding and they state suggested that she stay off it.   Device information MDT loop implanted 05/19/2018 Cryptogenic stroke   Past Medical History:  Diagnosis Date  . A-fib ([place])   . Anxiety state 12/22/2013  . Arthritis    hands, back  . Arthritis, lumbar spine 12/19/2012  . Cataracts, bilateral   . Chronic lower back pain 12/19/2012  . Diabetes mellitus ([place])   . Diverticulitis   . Dysrhythmia   . GERD (gastroesophageal reflux disease)   . Glaucoma   . Headache    hx migraines  . Heart murmur   . History of shingles   . Hyperlipidemia   . Hypertension   . Numbness    rt side of rt leg  . Post herpetic neuralgia 12/19/2012  . Stroke [facility])    may 2020    Past Surgical History:  Procedure Laterality Date  . ABDOMINAL HYSTERECTOMY  1964   partial  . COLON RESECTION N/A 09/02/2013   Procedure: [place] colectomy;  Surgeon: Excell Seltzer, MD;  [facility];  Service: General;  Laterality: N/A;  . COLONOSCOPY    . COLOSTOMY TAKEDOWN N/A 11/09/2014    Procedure: TAKEDOWN [place] COLOSTOMY;  Surgeon: Excell Seltzer, MD;  [facility];  Service: General;  Laterality: N/A;  . [place]   left  . LOOP RECORDER INSERTION N/A 05/19/2018   Procedure: LOOP RECORDER INSERTION;  Surgeon: Evans Lance, MD;  Location: [facility];  Service: Cardiovascular;  Laterality: N/A;  . POLYPECTOMY      Current Outpatient Medications  Medication Sig Dispense Refill  . atorvastatin (LIPITOR) 80 MG tablet Take 1 tablet (80 mg total) by mouth daily at 6 PM. 90 tablet 3  . blood glucose meter kit and supplies KIT Use up to four times daily as directed. E11.9 1 each 0  . Blood Glucose Monitoring Suppl (ONE TOUCH ULTRA 2) w/Device KIT USE as directed 1 each 0  . cephALEXin (KEFLEX) 500 MG capsule Take 1 capsule (500 mg total) by mouth 3 (three) times daily. 20 capsule 0  . diltiazem (CARDIZEM CD) 240 MG 24 hr capsule Take 1 capsule (240 mg total) by mouth daily. Take 240 mg by mouth daily. 90 capsule 1  . gabapentin (NEURONTIN) 100 MG capsule Take 1 capsule (100 mg total) by mouth 3 (three) times daily. 90 capsule 5  . glipiZIDE (GLUCOTROL XL) 2.5 MG 24 hr tablet Take 1 tablet (2.5 mg total) by  mouth daily with breakfast. 90 tablet 3  . HYDROcodone-acetaminophen (NORCO/VICODIN) 5-325 MG tablet Take 1 tablet by mouth every 6 (six) hours as needed. 10 tablet 0  . Lancets MISC Use as directed once daily 100 each 11  . meclizine (ANTIVERT) 12.5 MG tablet TAKE 1 TABLET BY MOUTH THREE TIMES DAILY AS NEEDED FOR DIZZINESS 30 tablet 0  . metFORMIN (GLUCOPHAGE) 500 MG tablet Take 500 mg by mouth 2 (two) times daily with a meal.    . metoprolol tartrate (LOPRESSOR) 25 MG tablet Take 1 tablet (25 mg total) by mouth 2 (two) times daily. 60 tablet 1  . pantoprazole (PROTONIX) 40 MG tablet Take 1 tablet (40 mg total) by mouth daily. For reflux 90 tablet 0  . tiZANidine (ZANAFLEX) 4 MG tablet TAKE 1 TABLET BY MOUTH EVERY 6 HOURS AS  NEEDED FOR MUSCLE SPASM 40 tablet 0   No current facility-administered medications for this visit.    Allergies:   Patient has no known allergies.   Social History:  The patient  reports that she quit smoking about 22 years ago. Her smoking use included cigarettes. She started smoking about 28 years ago. She has never used smokeless tobacco. She reports that she does not drink alcohol and does not use drugs.   Family History:  The patient's family history includes Hypertension in her maternal grandmother and mother.  ROS:  Please see the history of present illness.    All other systems are reviewed and otherwise negative.   PHYSICAL EXAM:  VS:  BP 140/78   Pulse 72   Ht 5' 2"  (1.575 m)   Wt 152 lb 9.6 oz (69.2 kg)   SpO2 95%   BMI 27.91 kg/m  BMI: Body mass index is 27.91 kg/m. Well nourished, well developed, in no acute distress HEENT: normocephalic, atraumatic Neck: no JVD, carotid bruits or masses Cardiac:  RRR; no significant murmurs, no rubs, or gallops Lungs:  CTA b/l, no wheezing, rhonchi or rales Abd: soft, nontender MS: no deformity or atrophy Ext: no edema Skin: warm and dry, no rash Neuro:  No gross deficits appreciated Psych: euthymic mood, full affect   ILR site is stable, no tethering or discomfort   EKG:  Not done today   Device interrogation done today and reviewed by myself:  Battery is good R waves 0.68m 10 tachy and 477 AF episodes (total) Burden 0.7% I reviewed all of her available EGMs. AF episodes all but one appeared false for SR/PACs and noise artfact Tachy episodes are AF w/RVR   05/17/2018: TTE IMPRESSIONS  1. The left ventricle has normal systolic function with an ejection  fraction of 60-65%. The cavity size was normal. Left ventricular diastolic  Doppler parameters are consistent with impaired relaxation. Elevated left  ventricular end-diastolic pressure.  2. The right ventricle has normal systolic function. The cavity was   normal. There is no increase in right ventricular wall thickness.  3. Mild calcification of the anterior mitral valve leaflet. There is mild  mitral annular calcification present.  4. The interatrial septum appears to be lipomatous.R 5. Recommend  agitated saline contrast study to fruleo ut PFO    Recent Labs: 06/30/2019: Hemoglobin 10.3; Platelets 234  No results found for requested labs within last 8760 hours.   CrCl cannot be calculated (Patient's most recent lab result is older than the maximum 21 days allowed.).   Wt Readings from Last 3 Encounters:  02/09/20 152 lb 9.6 oz (69.2 kg)  07/21/19 147 lb  9.6 oz (67 kg)  06/30/19 145 lb 1 oz (65.8 kg)     Other studies reviewed: Additional studies/records reviewed today include: summarized above  ASSESSMENT AND PLAN:  1. Paroxysmal AFib     CHA2DS2Vasc is 7, off Eliquis  Burden is lower then reported with at least some false episodes. Last true AFib I see was back in Aug 2021. I discussed at length with the patient and her son her increased stroke risk associated with AFib, and rational for anticoagulation, particularly in the environment of prior stroke nad trying to reduce recurrence of strokes, we discussed ASA is not an alternative or effective in stroke reduction associated with AFib Discussed alternative to eliquis, such as Xarelto  AT this point the son and the patient is in agreement are most comfortable staying OFF anticoagulation. They state understanding of the discussion. They would be more inclined to rethink a/c should her afib burden increase. Discussed unable to predict that, but would resume monthly monitoring to follow closely (unclear why she was not getting monthly transmissions)  2. HTN     No changes today, monitor, she reports seeing her PMD regularly     Deferred to PMD  Disposition: F/u with monthly transmissions, I have staff messaged the device clinic to resume monthly monitoring, and otherwise  see her back in a year, sooner if needed.  Current medicines are reviewed at length with the patient today.  The patient did not have any concerns regarding medicines.  Venetia Night, PA-C 02/09/2020 11:30 AM     [facility] [address] [place] [address] [phone] (office)  [phone] (fax)

## 2020-02-09 ENCOUNTER — Ambulatory Visit: Payer: Medicare HMO | Admitting: Physician Assistant

## 2020-02-09 ENCOUNTER — Encounter: Payer: Self-pay | Admitting: Physician Assistant

## 2020-02-09 ENCOUNTER — Other Ambulatory Visit: Payer: Self-pay

## 2020-02-09 VITALS — BP 140/78 | HR 72 | Ht 62.0 in | Wt 152.6 lb

## 2020-02-09 DIAGNOSIS — I480 Paroxysmal atrial fibrillation: Secondary | ICD-10-CM | POA: Diagnosis not present

## 2020-02-09 DIAGNOSIS — I10 Essential (primary) hypertension: Secondary | ICD-10-CM | POA: Diagnosis not present

## 2020-02-09 DIAGNOSIS — I639 Cerebral infarction, unspecified: Secondary | ICD-10-CM | POA: Diagnosis not present

## 2020-02-09 DIAGNOSIS — Z4509 Encounter for adjustment and management of other cardiac device: Secondary | ICD-10-CM | POA: Diagnosis not present

## 2020-02-09 LAB — CUP PACEART INCLINIC DEVICE CHECK
Date Time Interrogation Session: 20220208114525
Implantable Pulse Generator Implant Date: 20200518

## 2020-02-09 NOTE — Patient Instructions (Signed)
Medication Instructions:    Your physician recommends that you continue on your current medications as directed. Please refer to the Current Medication list given to you today.  *If you need a refill on your cardiac medications before your next appointment, please call your pharmacy*   Lab Work: [place]   If you have labs (blood work) drawn today and your tests are completely normal, you will receive your results only by: [name] MyChart Message (if you have MyChart) OR . A paper copy in the mail If you have any lab test that is abnormal or we need to change your treatment, we will call you to review the results.   Testing/Procedures: NONE ORDERED  TODAY     Follow-Up: At [facility], you and your health needs are our priority.  As part of our continuing mission to provide you with exceptional heart care, we have created designated Provider Care Teams.  These Care Teams include your primary Cardiologist (physician) and Advanced Practice Providers (APPs -  Physician Assistants and Nurse Practitioners) who all work together to provide you with the care you need, when you need it.  We recommend signing up for the patient portal called "MyChart".  Sign up information is provided on this After Visit Summary.  MyChart is used to connect with patients for Virtual Visits (Telemedicine).  Patients are able to view lab/test results, encounter notes, upcoming appointments, etc.  Non-urgent messages can be sent to your provider as well.   To learn more about what you can do with MyChart, go to [email or web address].    Your next appointment:   1 year(s)  The format for your next appointment:   In Person  Provider:   You may see Dr. Lovena Le one of the following Advanced Practice Providers on your designated Care Team:    Tommye Standard, [place]   Other Instructions

## 2020-02-11 ENCOUNTER — Other Ambulatory Visit: Payer: Self-pay | Admitting: Internal Medicine

## 2020-03-11 ENCOUNTER — Other Ambulatory Visit: Payer: Self-pay | Admitting: Internal Medicine

## 2020-04-08 DIAGNOSIS — N185 Chronic kidney disease, stage 5: Secondary | ICD-10-CM | POA: Diagnosis not present

## 2020-04-14 DIAGNOSIS — D631 Anemia in chronic kidney disease: Secondary | ICD-10-CM | POA: Diagnosis not present

## 2020-04-14 DIAGNOSIS — I4891 Unspecified atrial fibrillation: Secondary | ICD-10-CM | POA: Diagnosis not present

## 2020-04-14 DIAGNOSIS — E875 Hyperkalemia: Secondary | ICD-10-CM | POA: Diagnosis not present

## 2020-04-14 DIAGNOSIS — E213 Hyperparathyroidism, unspecified: Secondary | ICD-10-CM | POA: Diagnosis not present

## 2020-04-14 DIAGNOSIS — E278 Other specified disorders of adrenal gland: Secondary | ICD-10-CM | POA: Diagnosis not present

## 2020-04-14 DIAGNOSIS — N185 Chronic kidney disease, stage 5: Secondary | ICD-10-CM | POA: Diagnosis not present

## 2020-04-14 DIAGNOSIS — R809 Proteinuria, unspecified: Secondary | ICD-10-CM | POA: Diagnosis not present

## 2020-04-14 DIAGNOSIS — E1122 Type 2 diabetes mellitus with diabetic chronic kidney disease: Secondary | ICD-10-CM | POA: Diagnosis not present

## 2020-04-14 DIAGNOSIS — I120 Hypertensive chronic kidney disease with stage 5 chronic kidney disease or end stage renal disease: Secondary | ICD-10-CM | POA: Diagnosis not present

## 2020-04-15 ENCOUNTER — Other Ambulatory Visit: Payer: Self-pay | Admitting: Internal Medicine

## 2020-04-19 ENCOUNTER — Ambulatory Visit: Payer: Medicare HMO

## 2020-04-26 ENCOUNTER — Telehealth: Payer: Self-pay

## 2020-04-26 NOTE — Telephone Encounter (Signed)
Attempted to contact patient's son Jeneen Rinks  to schedule a Palliative Care consult appointment. No answer left a message to return call.

## 2020-04-27 ENCOUNTER — Telehealth: Payer: Self-pay

## 2020-04-27 NOTE — Telephone Encounter (Signed)
Spoke with patient's son Jeneen Rinks and scheduled an in-person Palliative Consult for 05/12/20 @ 12:30PM  COVID screening was negative. They have a toy poodle in the home. Patient lives with son Jeneen Rinks.  Consent obtained; updated Outlook/Netsmart/Team List and Epic.  Family is aware they may be receiving a call from NP the day before or day of to confirm appointment.

## 2020-04-29 ENCOUNTER — Telehealth: Payer: Self-pay | Admitting: Internal Medicine

## 2020-04-29 NOTE — Telephone Encounter (Signed)
I am not sure what to do, as she is being seen by palliative care for pain, and I would not know whether she would need podiatry or orthopedic or some other speciality; so unless the son can clarify what he wants.Marland KitchenMarland KitchenMarland Kitchen

## 2020-04-29 NOTE — Telephone Encounter (Signed)
Patients son called and was wondering if a referral could be placed for the patient. He said that her feet are hurting when she wakes up in the morning. Please advise

## 2020-05-02 NOTE — Telephone Encounter (Signed)
Patient son notified of Dr. Jenny Reichmann last message.

## 2020-05-07 ENCOUNTER — Other Ambulatory Visit: Payer: Self-pay | Admitting: Internal Medicine

## 2020-05-10 DIAGNOSIS — N185 Chronic kidney disease, stage 5: Secondary | ICD-10-CM | POA: Diagnosis not present

## 2020-05-12 ENCOUNTER — Other Ambulatory Visit: Payer: Self-pay

## 2020-05-12 ENCOUNTER — Other Ambulatory Visit: Payer: Self-pay | Admitting: Hospice

## 2020-05-12 DIAGNOSIS — N184 Chronic kidney disease, stage 4 (severe): Secondary | ICD-10-CM

## 2020-05-12 DIAGNOSIS — Z515 Encounter for palliative care: Secondary | ICD-10-CM

## 2020-05-12 DIAGNOSIS — E113513 Type 2 diabetes mellitus with proliferative diabetic retinopathy with macular edema, bilateral: Secondary | ICD-10-CM

## 2020-05-12 NOTE — Progress Notes (Signed)
Kimberly Castillo Note Telephone: [phone]  Fax: [phone]  PATIENT NAME: Kimberly Castillo [address] [place] [address] [phone] (home)  DOB: 03-Sep-1933 MRN: [id number]  PRIMARY CARE PROVIDER:    Biagio Borg, MD,  [place] [address] [phone]  REFERRING PROVIDER:   Dr. Harrie Castillo - Nephrologist  RESPONSIBLE PARTY:   Kimberly Castillo [phone] Kimberly Castillo also lives at home with patient: [phone]  Contact Information    Name Relation Home Work Mobile   Kimberly Castillo [phone]  Kimberly Castillo Daughter   [phone]   Kimberly Castillo   [phone]       I met face to face with patient and family at home. Palliative Care was asked to follow this patient by consultation request of Dr. Harrie Castillo  to address advance care planning, complex medical decision making and goals of care clarification. Kimberly Castillo is home with patient during visit. This is the initial visit.    ASSESSMENT AND / RECOMMENDATIONS:   Advance Care Planning: Our advance care planning conversation included a discussion about:     The value and importance of advance care planning   Difference between Hospice and Palliative care  Exploration of goals of care in the event of a sudden injury or illness   Identification and preparation of a healthcare agent   Review and updating or creation of an  advance directive document .  Decision not to resuscitate or to de-escalate disease focused treatments due to poor prognosis.  CODE STATUS: Code status discussed.  Kimberly Castillo affirmed that patient is a Do Not Resuscitate.  NP signed DNR form for patient to keep at home: Same document uploaded to epic today.  Goals of Care: Goals include to maximize quality of life and symptom management. Patient does not want dialysis.  I spent 46 minutes providing this initial consultation. More than 50% of the time in this  consultation was spent on counseling patient and coordinating communication. --------------------------------------------------------------------------------------------------------------------------------------  Symptom Management/Plan: CKD IV: Progressing. Followed by Nephrologist; patient not interested in dialysis Routine CBC BMP.  Avoid nephrotoxic medications/substances.  Ensure adequate hydration. Hypertension: Continue diltiazem, Metoprolol Dizziness: Managed with Meclizine. Slow position changes encouraged.  Fall precautions Low back pain: Tylenol, Gabapentin Kimberly Castillo manages her medications Follow up: Palliative care will continue to follow for complex medical decision making, advance care planning, and clarification of goals. Return 6 weeks or prn.Encouraged to call provider sooner with any concerns.   Family /Caregiver/Community Supports: Patient lives at home with her son Kimberly Castillo and other children. Kimberly Castillo just retired last week from work. Strong family support system identified  PPS: 50%  HOSPICE ELIGIBILITY/DIAGNOSIS: TBD  Chief Complaint: Initial Palliative care visit/Chronic kidney disease  HISTORY OF PRESENT ILLNESS:  Kimberly Castillo is a 87 y.o. year old female  with multiple medical conditions including chronic kidney disease, progressively worsening, now a stage 4, last GFR 27,  Jan 2022; CKD impairs her quality of life.  Patient reports she is still able to void; she knows that alcohol, soda especially colored soda worsens her kidney function while reduced right meat intake is helpful. Education on avoiding nephrotoxic substances and adequate fluid intake.  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 History obtained from review of EMR, discussion with primary team, caregiver, family and/or Kimberly Castillo.  Review and summarization of Epic records shows history from other than  patient. Rest of 10 point ROS asked and negative.     Review of lab tests/diagnostics   Results for Kimberly Castillo" (MRN [id number]) as of 05/12/2020 12:50  Ref. Range 01/13/2019 09:59  Sodium Latest Ref Range: 135 - 145 mEq/L 140  Potassium Latest Ref Range: 3.5 - 5.1 mEq/L 3.4 (L)  Chloride Latest Ref Range: 96 - 112 mEq/L 106  CO2 Latest Ref Range: 19 - 32 mEq/L 25  Glucose Latest Ref Range: 70 - 99 mg/dL 107 (H)  BUN Latest Ref Range: 6 - 23 mg/dL 40 (H)  Creatinine Latest Ref Range: 0.40 - 1.20 mg/dL 2.09 (H)  Calcium Latest Ref Range: 8.4 - 10.5 mg/dL 9.6  Alkaline Phosphatase Latest Ref Range: 39 - 117 U/L 64  Albumin Latest Ref Range: 3.5 - 5.2 g/dL 3.7  AST Latest Ref Range: 0 - 37 U/L 13  ALT Latest Ref Range: 0 - 35 U/L 9  Total Protein Latest Ref Range: 6.0 - 8.3 g/dL 7.2  Bilirubin, Direct Latest Ref Range: 0.0 - 0.3 mg/dL 0.1  Total Bilirubin Latest Ref Range: 0.2 - 1.2 mg/dL 0.4  GFR Latest Ref Range: >60.00 mL/min 27.30 (L)   No results for input(s): APTT, INR in the last 168 hours.  Invalid input(s): PTPATIENT No results for input(s): BNP, PROBNP in the last 168 hours.  ROS General: NAD EYES: denies vision changes ENMT: denies dysphagia Cardiovascular: denies chest pain/discomfort Pulmonary: denies cough, denies SOB Abdomen: endorses good appetite, denies constipation, endorses continence of bowel GU: denies dysuria, urinary frequency MSK:  denies weakness,  no falls reported Skin: denies rashes or wounds Neurological: denies pain, denies insomnia Psych: Endorses positive mood Heme/lymph/immuno: denies bruises, abnormal bleeding  Physical Exam: Height/Weight 5 feet 2 inches/145 Ibs Constitutional: NAD General: Well groomed, cooperative EYES: anicteric sclera, lids intact, no discharge  ENMT: Moist mucous membrane CV: S1 S2, RRR, no LE edema Pulmonary: LCTA, no increased work of breathing, no cough, Abdomen: active BS + 4 quadrants, soft and non tender GU: no suprapubic tenderness MSK:  weakness Skin: warm and dry, no rashes or wounds on visible skin Neuro:  weakness, otherwise non focal Psych: non-anxious affect Hem/lymph/immuno: no widespread bruising   PAST MEDICAL HISTORY:  Active Ambulatory Problems    Diagnosis Date Noted  . DM type 2 (diabetes mellitus, type 2) ([place]) 09/22/2012  . Hypertension 09/22/2012  . Hyperlipemia 09/22/2012  . GERD (gastroesophageal reflux disease) 10/06/2012  . Post herpetic neuralgia 12/19/2012  . Arthritis, lumbar spine 12/19/2012  . Glaucoma   . A-fib ([place])   . Diverticulitis of colon with perforation s/p colectomy/ostomy 09/02/2013 09/02/2013  . Anxiety state 12/22/2013  . Yeast vaginitis 12/22/2013  . Preventative health care 12/31/2013  . Rash 06/24/2014  . Colostomy status ([place]) 11/09/2014  . Bilateral foot pain 01/26/2015  . Chronic low back pain 07/12/2015  . CKD (chronic kidney disease), stage III ([place]) 02/16/2016  . Spondylolisthesis, lumbar region 03/02/2016  . Right knee pain 02/12/2017  . Memory changes 05/13/2018  . CVA (cerebral vascular accident) ([place]) 05/16/2018  . Noncompliance 05/16/2018  . Vitamin D deficiency 05/16/2018  . Abnormal EKG 05/16/2018  . Headache 05/16/2018  . Acute CVA (cerebrovascular accident) ([place]) 05/17/2018  . Frequent nosebleeds 07/13/2019  . Right wrist pain 07/13/2019   Resolved Ambulatory Problems    Diagnosis Date Noted  . Other and unspecified hyperlipidemia 09/22/2012  . Chronic lower back pain 12/19/2012  . Preventative health care 12/19/2012   Past Medical History:  Diagnosis  Date  . Arthritis   . Cataracts, bilateral   . Diabetes mellitus ([place])   . Diverticulitis   . Dysrhythmia   . Heart murmur   . History of shingles   . Hyperlipidemia   . Numbness   . Stroke [facility])     SOCIAL HX:  Social History   Tobacco Use  . Smoking status: Former Smoker    Types: Cigarettes    Start date: 09/17/1991    Quit date: 10/31/1997    Years since quitting: 22.5  .  Smokeless tobacco: Never Used  Substance Use Topics  . Alcohol use: No     FAMILY HX:  Family History  Problem Relation Age of Onset  . Hypertension Mother   . Hypertension Maternal Grandmother   . Colon cancer Neg Hx   . Stomach cancer Neg Hx   . Colon polyps Neg Hx   . Esophageal cancer Neg Hx   . Rectal cancer Neg Hx       ALLERGIES: No Known Allergies    PERTINENT MEDICATIONS:  Outpatient Encounter Medications as of 05/12/2020  Medication Sig  . atorvastatin (LIPITOR) 80 MG tablet Take 1 tablet (80 mg total) by mouth daily at 6 PM.  . blood glucose meter kit and supplies KIT Use up to four times daily as directed. E11.9  . Blood Glucose Monitoring Suppl (ONE TOUCH ULTRA 2) w/Device KIT USE as directed  . cephALEXin (KEFLEX) 500 MG capsule Take 1 capsule (500 mg total) by mouth 3 (three) times daily.  [name] diltiazem (CARDIZEM CD) 240 MG 24 hr capsule Take 1 capsule (240 mg total) by mouth daily. Take 240 mg by mouth daily.  [name] gabapentin (NEURONTIN) 100 MG capsule Take 1 capsule (100 mg total) by mouth 3 (three) times daily.  [name] glipiZIDE (GLUCOTROL XL) 2.5 MG 24 hr tablet Take 1 tablet by mouth once daily with breakfast  . HYDROcodone-acetaminophen (NORCO/VICODIN) 5-325 MG tablet Take 1 tablet by mouth every 6 (six) hours as needed.  . Lancets MISC Use as directed once daily  . meclizine (ANTIVERT) 12.5 MG tablet TAKE 1 TABLET BY MOUTH THREE TIMES DAILY AS NEEDED FOR DIZZINESS  . metFORMIN (GLUCOPHAGE) 500 MG tablet Take 500 mg by mouth 2 (two) times daily with a meal.  . metoprolol tartrate (LOPRESSOR) 25 MG tablet Take 1 tablet (25 mg total) by mouth 2 (two) times daily.  . pantoprazole (PROTONIX) 40 MG tablet Take 1 tablet (40 mg total) by mouth daily. For reflux  . tiZANidine (ZANAFLEX) 4 MG tablet TAKE 1 TABLET BY MOUTH EVERY 6 HOURS AS NEEDED FOR MUSCLE SPASM   No facility-administered encounter medications on file as of 05/12/2020.    Thank you for the opportunity to  participate in the care of Ms. Roseland.  The palliative care team will continue to follow. Please call our office at [phone] if we can be of additional assistance.   Note: Portions of this note were generated with [employer]obby[employer]. Dictation errors may occur despite best attempts at proofreading.  Teodoro Spray, NP

## 2020-05-13 DIAGNOSIS — E213 Hyperparathyroidism, unspecified: Secondary | ICD-10-CM | POA: Diagnosis not present

## 2020-05-13 DIAGNOSIS — E278 Other specified disorders of adrenal gland: Secondary | ICD-10-CM | POA: Diagnosis not present

## 2020-05-13 DIAGNOSIS — I4891 Unspecified atrial fibrillation: Secondary | ICD-10-CM | POA: Diagnosis not present

## 2020-05-13 DIAGNOSIS — D631 Anemia in chronic kidney disease: Secondary | ICD-10-CM | POA: Diagnosis not present

## 2020-05-13 DIAGNOSIS — I120 Hypertensive chronic kidney disease with stage 5 chronic kidney disease or end stage renal disease: Secondary | ICD-10-CM | POA: Diagnosis not present

## 2020-05-13 DIAGNOSIS — R809 Proteinuria, unspecified: Secondary | ICD-10-CM | POA: Diagnosis not present

## 2020-05-13 DIAGNOSIS — E875 Hyperkalemia: Secondary | ICD-10-CM | POA: Diagnosis not present

## 2020-05-13 DIAGNOSIS — N185 Chronic kidney disease, stage 5: Secondary | ICD-10-CM | POA: Diagnosis not present

## 2020-05-13 DIAGNOSIS — E1122 Type 2 diabetes mellitus with diabetic chronic kidney disease: Secondary | ICD-10-CM | POA: Diagnosis not present

## 2020-05-15 ENCOUNTER — Inpatient Hospital Stay (HOSPITAL_COMMUNITY): Payer: Medicare HMO

## 2020-05-15 ENCOUNTER — Other Ambulatory Visit (HOSPITAL_COMMUNITY): Payer: Medicare HMO

## 2020-05-15 ENCOUNTER — Emergency Department (HOSPITAL_COMMUNITY): Payer: Medicare HMO

## 2020-05-15 ENCOUNTER — Other Ambulatory Visit: Payer: Self-pay

## 2020-05-15 ENCOUNTER — Inpatient Hospital Stay (HOSPITAL_COMMUNITY)
Admission: EM | Admit: 2020-05-15 | Discharge: 2020-06-01 | DRG: 683 | Disposition: E | Payer: Medicare HMO | Attending: Internal Medicine | Admitting: Internal Medicine

## 2020-05-15 ENCOUNTER — Encounter (HOSPITAL_COMMUNITY): Payer: Self-pay

## 2020-05-15 DIAGNOSIS — K219 Gastro-esophageal reflux disease without esophagitis: Secondary | ICD-10-CM | POA: Diagnosis present

## 2020-05-15 DIAGNOSIS — D539 Nutritional anemia, unspecified: Secondary | ICD-10-CM | POA: Diagnosis present

## 2020-05-15 DIAGNOSIS — N179 Acute kidney failure, unspecified: Principal | ICD-10-CM | POA: Diagnosis present

## 2020-05-15 DIAGNOSIS — R9431 Abnormal electrocardiogram [ECG] [EKG]: Secondary | ICD-10-CM | POA: Diagnosis not present

## 2020-05-15 DIAGNOSIS — M4316 Spondylolisthesis, lumbar region: Secondary | ICD-10-CM | POA: Diagnosis not present

## 2020-05-15 DIAGNOSIS — I9589 Other hypotension: Secondary | ICD-10-CM | POA: Diagnosis present

## 2020-05-15 DIAGNOSIS — R109 Unspecified abdominal pain: Secondary | ICD-10-CM | POA: Diagnosis not present

## 2020-05-15 DIAGNOSIS — M47816 Spondylosis without myelopathy or radiculopathy, lumbar region: Secondary | ICD-10-CM | POA: Diagnosis present

## 2020-05-15 DIAGNOSIS — F411 Generalized anxiety disorder: Secondary | ICD-10-CM | POA: Diagnosis present

## 2020-05-15 DIAGNOSIS — R748 Abnormal levels of other serum enzymes: Secondary | ICD-10-CM | POA: Diagnosis present

## 2020-05-15 DIAGNOSIS — E1122 Type 2 diabetes mellitus with diabetic chronic kidney disease: Secondary | ICD-10-CM | POA: Diagnosis present

## 2020-05-15 DIAGNOSIS — Z20822 Contact with and (suspected) exposure to covid-19: Secondary | ICD-10-CM | POA: Diagnosis present

## 2020-05-15 DIAGNOSIS — I498 Other specified cardiac arrhythmias: Secondary | ICD-10-CM | POA: Diagnosis not present

## 2020-05-15 DIAGNOSIS — I700 Atherosclerosis of aorta: Secondary | ICD-10-CM | POA: Diagnosis not present

## 2020-05-15 DIAGNOSIS — E785 Hyperlipidemia, unspecified: Secondary | ICD-10-CM | POA: Diagnosis not present

## 2020-05-15 DIAGNOSIS — R1084 Generalized abdominal pain: Secondary | ICD-10-CM

## 2020-05-15 DIAGNOSIS — Z90710 Acquired absence of both cervix and uterus: Secondary | ICD-10-CM

## 2020-05-15 DIAGNOSIS — R112 Nausea with vomiting, unspecified: Secondary | ICD-10-CM | POA: Diagnosis not present

## 2020-05-15 DIAGNOSIS — K529 Noninfective gastroenteritis and colitis, unspecified: Secondary | ICD-10-CM | POA: Diagnosis present

## 2020-05-15 DIAGNOSIS — N189 Chronic kidney disease, unspecified: Secondary | ICD-10-CM | POA: Diagnosis not present

## 2020-05-15 DIAGNOSIS — Z79899 Other long term (current) drug therapy: Secondary | ICD-10-CM | POA: Diagnosis not present

## 2020-05-15 DIAGNOSIS — I129 Hypertensive chronic kidney disease with stage 1 through stage 4 chronic kidney disease, or unspecified chronic kidney disease: Secondary | ICD-10-CM | POA: Diagnosis present

## 2020-05-15 DIAGNOSIS — E875 Hyperkalemia: Secondary | ICD-10-CM | POA: Diagnosis present

## 2020-05-15 DIAGNOSIS — N2889 Other specified disorders of kidney and ureter: Secondary | ICD-10-CM | POA: Diagnosis not present

## 2020-05-15 DIAGNOSIS — R000 Tachycardia, unspecified: Secondary | ICD-10-CM | POA: Diagnosis not present

## 2020-05-15 DIAGNOSIS — I959 Hypotension, unspecified: Secondary | ICD-10-CM | POA: Diagnosis not present

## 2020-05-15 DIAGNOSIS — N183 Chronic kidney disease, stage 3 unspecified: Secondary | ICD-10-CM | POA: Diagnosis present

## 2020-05-15 DIAGNOSIS — Z66 Do not resuscitate: Secondary | ICD-10-CM | POA: Diagnosis not present

## 2020-05-15 DIAGNOSIS — I161 Hypertensive emergency: Secondary | ICD-10-CM | POA: Insufficient documentation

## 2020-05-15 DIAGNOSIS — J9811 Atelectasis: Secondary | ICD-10-CM | POA: Diagnosis not present

## 2020-05-15 DIAGNOSIS — Z87891 Personal history of nicotine dependence: Secondary | ICD-10-CM

## 2020-05-15 DIAGNOSIS — D72828 Other elevated white blood cell count: Secondary | ICD-10-CM | POA: Diagnosis present

## 2020-05-15 DIAGNOSIS — D72829 Elevated white blood cell count, unspecified: Secondary | ICD-10-CM | POA: Diagnosis not present

## 2020-05-15 DIAGNOSIS — Z8673 Personal history of transient ischemic attack (TIA), and cerebral infarction without residual deficits: Secondary | ICD-10-CM

## 2020-05-15 DIAGNOSIS — I4891 Unspecified atrial fibrillation: Secondary | ICD-10-CM | POA: Diagnosis present

## 2020-05-15 DIAGNOSIS — R197 Diarrhea, unspecified: Secondary | ICD-10-CM

## 2020-05-15 DIAGNOSIS — Z7984 Long term (current) use of oral hypoglycemic drugs: Secondary | ICD-10-CM | POA: Diagnosis not present

## 2020-05-15 LAB — VITAMIN B12: Vitamin B-12: 494 pg/mL (ref 180–914)

## 2020-05-15 LAB — SODIUM, URINE, RANDOM: Sodium, Ur: 147 mmol/L

## 2020-05-15 LAB — COMPREHENSIVE METABOLIC PANEL
ALT: 28 U/L (ref 0–44)
AST: 27 U/L (ref 15–41)
Albumin: 4 g/dL (ref 3.5–5.0)
Alkaline Phosphatase: 73 U/L (ref 38–126)
Anion gap: 10 (ref 5–15)
BUN: 63 mg/dL — ABNORMAL HIGH (ref 8–23)
CO2: 17 mmol/L — ABNORMAL LOW (ref 22–32)
Calcium: 10 mg/dL (ref 8.9–10.3)
Chloride: 111 mmol/L (ref 98–111)
Creatinine, Ser: 4.44 mg/dL — ABNORMAL HIGH (ref 0.44–1.00)
GFR, Estimated: 9 mL/min — ABNORMAL LOW (ref >60)
Glucose, Bld: 154 mg/dL — ABNORMAL HIGH (ref 70–99)
Potassium: 4.7 mmol/L (ref 3.5–5.1)
Sodium: 138 mmol/L (ref 135–145)
Total Bilirubin: 0.4 mg/dL (ref 0.3–1.2)
Total Protein: 8.1 g/dL (ref 6.5–8.1)

## 2020-05-15 LAB — CREATININE, URINE, RANDOM: Creatinine, Urine: 26.07 mg/dL

## 2020-05-15 LAB — URINALYSIS, ROUTINE W REFLEX MICROSCOPIC
Bacteria, UA: NONE SEEN
Bilirubin Urine: NEGATIVE
Glucose, UA: 50 mg/dL — AB
Ketones, ur: NEGATIVE mg/dL
Leukocytes,Ua: NEGATIVE
Nitrite: NEGATIVE
Protein, ur: >=300 mg/dL — AB
Specific Gravity, Urine: 1.01 (ref 1.005–1.030)
pH: 6 (ref 5.0–8.0)

## 2020-05-15 LAB — CBC
HCT: 36.1 % (ref 36.0–46.0)
Hemoglobin: 11.4 g/dL — ABNORMAL LOW (ref 12.0–15.0)
MCH: 31.9 pg (ref 26.0–34.0)
MCHC: 31.6 g/dL (ref 30.0–36.0)
MCV: 101.1 fL — ABNORMAL HIGH (ref 80.0–100.0)
Platelets: 229 10*3/uL (ref 150–400)
RBC: 3.57 MIL/uL — ABNORMAL LOW (ref 3.87–5.11)
RDW: 12.4 % (ref 11.5–15.5)
WBC: 12.1 10*3/uL — ABNORMAL HIGH (ref 4.0–10.5)
nRBC: 0 % (ref 0.0–0.2)

## 2020-05-15 LAB — SARS CORONAVIRUS 2 (TAT 6-24 HRS): SARS Coronavirus 2: NEGATIVE

## 2020-05-15 LAB — FOLATE: Folate: 8.6 ng/mL (ref >5.9)

## 2020-05-15 LAB — SEDIMENTATION RATE: Sed Rate: 58 mm/hr — ABNORMAL HIGH (ref 0–22)

## 2020-05-15 LAB — CBG MONITORING, ED: Glucose-Capillary: 131 mg/dL — ABNORMAL HIGH (ref 70–99)

## 2020-05-15 LAB — C-REACTIVE PROTEIN: CRP: 0.9 mg/dL (ref <1.0)

## 2020-05-15 LAB — LIPASE, BLOOD: Lipase: 184 U/L — ABNORMAL HIGH (ref 11–51)

## 2020-05-15 MED ORDER — HEPARIN SODIUM (PORCINE) 5000 UNIT/ML IJ SOLN
5000.0000 [IU] | Freq: Three times a day (TID) | INTRAMUSCULAR | Status: DC
  Administered 2020-05-15 (×2): 5000 [IU] via SUBCUTANEOUS
  Filled 2020-05-15 (×2): qty 1

## 2020-05-15 MED ORDER — DILTIAZEM HCL ER COATED BEADS 240 MG PO CP24
240.0000 mg | ORAL_CAPSULE | Freq: Every day | ORAL | Status: DC
  Administered 2020-05-15: 240 mg via ORAL
  Filled 2020-05-15: qty 1

## 2020-05-15 MED ORDER — GABAPENTIN 100 MG PO CAPS: 100.0000 mg | ORAL_CAPSULE | Freq: Three times a day (TID) | ORAL | Status: DC

## 2020-05-15 MED ORDER — SODIUM BICARBONATE 650 MG PO TABS: 650.0000 mg | ORAL_TABLET | Freq: Two times a day (BID) | ORAL | Status: DC

## 2020-05-15 MED ORDER — HYDROMORPHONE HCL 1 MG/ML IJ SOLN
0.5000 mg | INTRAMUSCULAR | Status: DC | PRN
Start: 2020-05-15 — End: 2020-05-16
  Administered 2020-05-15: 0.5 mg via INTRAVENOUS
  Filled 2020-05-15: qty 1

## 2020-05-15 MED ORDER — HYDRALAZINE HCL 20 MG/ML IJ SOLN
20.0000 mg | INTRAMUSCULAR | Status: AC
  Administered 2020-05-15: 20 mg via INTRAVENOUS
  Filled 2020-05-15: qty 1

## 2020-05-15 MED ORDER — MECLIZINE HCL 12.5 MG PO TABS
12.5000 mg | ORAL_TABLET | Freq: Three times a day (TID) | ORAL | Status: DC | PRN
  Filled 2020-05-15: qty 1

## 2020-05-15 MED ORDER — SODIUM CHLORIDE 0.9 % IV BOLUS
500.0000 mL | Freq: Once | INTRAVENOUS | Status: AC
  Administered 2020-05-15: 500 mL via INTRAVENOUS

## 2020-05-15 MED ORDER — FENTANYL CITRATE (PF) 100 MCG/2ML IJ SOLN
50.0000 ug | Freq: Once | INTRAMUSCULAR | Status: AC
  Administered 2020-05-15: 50 ug via INTRAVENOUS
  Filled 2020-05-15: qty 2

## 2020-05-15 MED ORDER — HYDROMORPHONE HCL 1 MG/ML IJ SOLN
0.5000 mg | Freq: Once | INTRAMUSCULAR | Status: AC
  Administered 2020-05-15: 0.5 mg via INTRAVENOUS
  Filled 2020-05-15: qty 1

## 2020-05-15 MED ORDER — METOPROLOL SUCCINATE ER 25 MG PO TB24
25.0000 mg | ORAL_TABLET | Freq: Every day | ORAL | Status: DC
  Administered 2020-05-15: 25 mg via ORAL
  Filled 2020-05-15: qty 1

## 2020-05-15 MED ORDER — HYDRALAZINE HCL 20 MG/ML IJ SOLN: 20.0000 mg | INTRAMUSCULAR | Status: DC | PRN

## 2020-05-15 MED ORDER — HYDROCODONE-ACETAMINOPHEN 5-325 MG PO TABS
1.0000 | ORAL_TABLET | Freq: Four times a day (QID) | ORAL | Status: DC | PRN
  Administered 2020-05-15: 1 via ORAL
  Filled 2020-05-15: qty 1

## 2020-05-15 MED ORDER — LABETALOL HCL 5 MG/ML IV SOLN: 10.0000 mg | INTRAVENOUS | Status: DC | PRN

## 2020-05-15 MED ORDER — SODIUM CHLORIDE 0.9% FLUSH
3.0000 mL | Freq: Two times a day (BID) | INTRAVENOUS | Status: DC
  Administered 2020-05-15 (×2): 3 mL via INTRAVENOUS

## 2020-05-15 MED ORDER — SODIUM CHLORIDE 0.45 % IV SOLN
INTRAVENOUS | Status: DC
  Filled 2020-05-15 (×2): qty 75

## 2020-05-15 MED ORDER — ONDANSETRON HCL 4 MG/2ML IJ SOLN
4.0000 mg | Freq: Once | INTRAMUSCULAR | Status: AC
  Administered 2020-05-15: 4 mg via INTRAVENOUS
  Filled 2020-05-15: qty 2

## 2020-05-15 MED ORDER — ACETAMINOPHEN 650 MG RE SUPP: 650.0000 mg | Freq: Four times a day (QID) | RECTAL | Status: DC | PRN

## 2020-05-15 MED ORDER — SODIUM BICARBONATE 650 MG PO TABS
650.0000 mg | ORAL_TABLET | Freq: Two times a day (BID) | ORAL | Status: DC
  Administered 2020-05-15: 650 mg via ORAL
  Filled 2020-05-15 (×2): qty 1

## 2020-05-15 MED ORDER — ACETAMINOPHEN 325 MG PO TABS: 650.0000 mg | ORAL_TABLET | Freq: Four times a day (QID) | ORAL | Status: DC | PRN

## 2020-05-15 MED ORDER — ALBUTEROL SULFATE (2.5 MG/3ML) 0.083% IN NEBU: 2.5000 mg | INHALATION_SOLUTION | Freq: Four times a day (QID) | RESPIRATORY_TRACT | Status: DC | PRN

## 2020-05-15 MED ORDER — HYDRALAZINE HCL 25 MG PO TABS
25.0000 mg | ORAL_TABLET | Freq: Once | ORAL | Status: AC
  Administered 2020-05-15: 25 mg via ORAL
  Filled 2020-05-15: qty 1

## 2020-05-15 MED ORDER — HYDRALAZINE HCL 20 MG/ML IJ SOLN
10.0000 mg | INTRAMUSCULAR | Status: DC | PRN
  Administered 2020-05-15: 10 mg via INTRAVENOUS
  Filled 2020-05-15 (×2): qty 1

## 2020-05-15 NOTE — ED Provider Notes (Signed)
[facility] EMERGENCY DEPARTMENT Provider Note   CSN: [id number] Arrival date & time: 05/22/2020  3837     History No chief complaint on file.   Kimberly Castillo is a 87 y.o. female.  HPI Patient presents with abdominal pain.  Lower abdomen.  Has been vomiting and having some diarrhea.  Began last night.  Feels little better after vomiting.  No fevers or chills.  No dysuria.  Pain is intermittent abdomen but also diffuse.  Has not had pains like this before.  Pain is somewhat sharp.    Past Medical History:  Diagnosis Date  . A-fib ([place])   . Anxiety state 12/22/2013  . Arthritis    hands, back  . Arthritis, lumbar spine 12/19/2012  . Cataracts, bilateral   . Chronic lower back pain 12/19/2012  . Diabetes mellitus ([place])   . Diverticulitis   . Dysrhythmia   . GERD (gastroesophageal reflux disease)   . Glaucoma   . Headache    hx migraines  . Heart murmur   . History of shingles   . Hyperlipidemia   . Hypertension   . Numbness    rt side of rt leg  . Post herpetic neuralgia 12/19/2012  . Stroke [facility])    may 2020    Patient Active Problem List   Diagnosis Date Noted  . Frequent nosebleeds 07/13/2019  . Right wrist pain 07/13/2019  . Acute CVA (cerebrovascular accident) ([place]) 05/17/2018  . CVA (cerebral vascular accident) ([place]) 05/16/2018  . Noncompliance 05/16/2018  . Vitamin D deficiency 05/16/2018  . Abnormal EKG 05/16/2018  . Headache 05/16/2018  . Memory changes 05/13/2018  . Right knee pain 02/12/2017  . Spondylolisthesis, lumbar region 03/02/2016  . CKD (chronic kidney disease), stage III ([place]) 02/16/2016  . Chronic low back pain 07/12/2015  . Bilateral foot pain 01/26/2015  . Colostomy status ([place]) 11/09/2014  . Rash 06/24/2014  . Preventative health care 12/31/2013  . Anxiety state 12/22/2013  . Yeast vaginitis 12/22/2013  . Diverticulitis of colon with perforation s/p colectomy/ostomy 09/02/2013 09/02/2013  . Post herpetic neuralgia  12/19/2012  . Arthritis, lumbar spine 12/19/2012  . Glaucoma   . A-fib ([place])   . GERD (gastroesophageal reflux disease) 10/06/2012  . DM type 2 (diabetes mellitus, type 2) ([place]) 09/22/2012  . Hypertension 09/22/2012  . Hyperlipemia 09/22/2012    Past Surgical History:  Procedure Laterality Date  . ABDOMINAL HYSTERECTOMY  1964   partial  . COLON RESECTION N/A 09/02/2013   Procedure: [place] colectomy;  Surgeon: Excell Seltzer, MD;  Location: [facility] ORS;  Service: General;  Laterality: N/A;  . COLONOSCOPY    . COLOSTOMY TAKEDOWN N/A 11/09/2014   Procedure: TAKEDOWN [place] COLOSTOMY;  Surgeon: Excell Seltzer, MD;  Location: [facility] ORS;  Service: General;  Laterality: N/A;  . [place]   left  . LOOP RECORDER INSERTION N/A 05/19/2018   Procedure: LOOP RECORDER INSERTION;  Surgeon: Evans Lance, MD;  Location: [facility] LAB;  Service: Cardiovascular;  Laterality: N/A;  . POLYPECTOMY       OB History   No obstetric history on file.     Family History  Problem Relation Age of Onset  . Hypertension Mother   . Hypertension Maternal Grandmother   . Colon cancer Neg Hx   . Stomach cancer Neg Hx   . Colon polyps Neg Hx   . Esophageal cancer Neg Hx   . Rectal cancer Neg Hx  Social History   Tobacco Use  . Smoking status: Former Smoker    Types: Cigarettes    Start date: 09/17/1991    Quit date: 10/31/1997    Years since quitting: 22.5  . Smokeless tobacco: Never Used  Substance Use Topics  . Alcohol use: No  . Drug use: No    Home Medications Prior to Admission medications   Medication Sig Start Date End Date Taking? Authorizing Provider  diltiazem (CARDIZEM CD) 240 MG 24 hr capsule Take 1 capsule (240 mg total) by mouth daily. Take 240 mg by mouth daily. 07/14/19  Yes Biagio Borg, MD  gabapentin (NEURONTIN) 100 MG capsule Take 1 capsule (100 mg total) by mouth 3 (three) times daily. 07/13/19  Yes Biagio Borg, MD  meclizine  (ANTIVERT) 12.5 MG tablet TAKE 1 TABLET BY MOUTH THREE TIMES DAILY AS NEEDED FOR DIZZINESS Patient taking differently: Take 12.5 mg by mouth 3 (three) times daily as needed for dizziness. 05/07/20  Yes Biagio Borg, MD  metoprolol succinate (TOPROL-XL) 25 MG 24 hr tablet Take 25 mg by mouth daily.   Yes [provider]  sodium bicarbonate 650 MG tablet Take 650 mg by mouth 2 (two) times daily.   Yes [provider]  spironolactone (ALDACTONE) 25 MG tablet Take 25 mg by mouth daily.   Yes [provider]  atorvastatin (LIPITOR) 80 MG tablet Take 1 tablet (80 mg total) by mouth daily at 6 PM. Patient not taking: Reported on 05/04/2020 06/23/18   Frann Rider, NP  blood glucose meter kit and supplies KIT Use up to four times daily as directed. E11.9 Patient not taking: Reported on 05/07/2020 05/20/18   Biagio Borg, MD  Blood Glucose Monitoring Suppl (ONE TOUCH ULTRA 2) w/Device KIT USE as directed Patient not taking: Reported on 05/17/2020 05/13/18   Biagio Borg, MD  cephALEXin (KEFLEX) 500 MG capsule Take 1 capsule (500 mg total) by mouth 3 (three) times daily. Patient not taking: Reported on 05/19/2020 01/27/19   Horton, Barbette Hair, MD  glipiZIDE (GLUCOTROL XL) 2.5 MG 24 hr tablet Take 1 tablet by mouth once daily with breakfast Patient not taking: Reported on 05/03/2020 02/12/20   Biagio Borg, MD  HYDROcodone-acetaminophen (NORCO/VICODIN) 5-325 MG tablet Take 1 tablet by mouth every 6 (six) hours as needed. Patient not taking: Reported on 05/03/2020 01/27/19   Merryl Hacker, MD  Lancets MISC Use as directed once daily Patient not taking: Reported on 05/22/2020 05/13/18   Biagio Borg, MD  metFORMIN (GLUCOPHAGE) 500 MG tablet Take 500 mg by mouth 2 (two) times daily with a meal. Patient not taking: Reported on 05/09/2020 12/19/18   [provider]  metoprolol tartrate (LOPRESSOR) 25 MG tablet Take 1 tablet (25 mg total) by mouth 2 (two) times daily. Patient not  taking: Reported on 05/21/2020 05/19/18   Caren Griffins, MD  pantoprazole (PROTONIX) 40 MG tablet Take 1 tablet (40 mg total) by mouth daily. For reflux Patient not taking: Reported on 05/24/2020 05/30/18   Biagio Borg, MD  tiZANidine (ZANAFLEX) 4 MG tablet TAKE 1 TABLET BY MOUTH EVERY 6 HOURS AS NEEDED FOR MUSCLE SPASM Patient not taking: Reported on 05/26/2020 12/19/18   Biagio Borg, MD    Allergies    Patient has no known allergies.  Review of Systems   Review of Systems  Constitutional: Positive for appetite change.  HENT: Negative for congestion.   Respiratory: Negative for shortness of breath.  Cardiovascular: Negative for chest pain.  Gastrointestinal: Positive for abdominal pain, diarrhea, nausea and vomiting.  Genitourinary: Negative for flank pain.  Musculoskeletal: Negative for back pain.  Skin: Negative for rash.  Neurological: Negative for weakness.  Psychiatric/Behavioral: Negative for confusion.    Physical Exam Updated Vital Signs BP (!) 223/94   Pulse 67   Temp 98.2 F (36.8 C) (Oral)   Resp 16   Ht 5' 3"  (1.6 m)   SpO2 99%   BMI 27.03 kg/m   Physical Exam Vitals and nursing note reviewed.  Constitutional:      Comments: Patient appears uncomfortable  HENT:     Head: Normocephalic.  Eyes:     Pupils: Pupils are equal, round, and reactive to light.  Cardiovascular:     Rate and Rhythm: Regular rhythm.  Pulmonary:     Breath sounds: No wheezing or rhonchi.  Abdominal:     Tenderness: There is abdominal tenderness.     Comments: Diffuse tenderness without rebound or guarding.  No hernia palpated.  Musculoskeletal:        General: No tenderness.     Cervical back: Neck supple.  Skin:    General: Skin is warm.     Capillary Refill: Capillary refill takes less than 2 seconds.  Neurological:     Mental Status: She is alert and oriented to person, place, and time.     ED Results / Procedures / Treatments   Labs (all labs ordered are listed,  but only abnormal results are displayed) Labs Reviewed  LIPASE, BLOOD - Abnormal; Notable for the following components:      Result Value   Lipase 184 (*)    All other components within normal limits  COMPREHENSIVE METABOLIC PANEL - Abnormal; Notable for the following components:   CO2 17 (*)    Glucose, Bld 154 (*)    BUN 63 (*)    Creatinine, Ser 4.44 (*)    GFR, Estimated 9 (*)    All other components within normal limits  CBC - Abnormal; Notable for the following components:   WBC 12.1 (*)    RBC 3.57 (*)    Hemoglobin 11.4 (*)    MCV 101.1 (*)    All other components within normal limits  URINALYSIS, ROUTINE W REFLEX MICROSCOPIC - Abnormal; Notable for the following components:   Color, Urine STRAW (*)    Glucose, UA 50 (*)    Hgb urine dipstick SMALL (*)    Protein, ur >=300 (*)    All other components within normal limits  CBG MONITORING, ED - Abnormal; Notable for the following components:   Glucose-Capillary 131 (*)    All other components within normal limits    EKG None  Radiology CT ABDOMEN PELVIS WO CONTRAST  Result Date: 05/21/2020 CLINICAL DATA:  Abdominal pain, acute nonlocalized EXAM: CT ABDOMEN AND PELVIS WITHOUT CONTRAST TECHNIQUE: Multidetector CT imaging of the abdomen and pelvis was performed following the standard protocol without IV contrast. COMPARISON:  01/09/2018 FINDINGS: Lower chest:  No contributory findings. Hepatobiliary: No focal liver abnormality.No evidence of biliary obstruction or stone. Pancreas: Unremarkable. Spleen: Unremarkable. Adrenals/Urinary Tract: Negative adrenals. No hydronephrosis or stone. Renal hilar calcification on the left is atherosclerotic. Unremarkable bladder. Stomach/Bowel: No obstruction. No visible bowel inflammation. Sigmoidectomy changes. Vascular/Lymphatic: No acute vascular abnormality. Atheromatous calcification of the aorta and branch vessels. No mass or adenopathy. Reproductive:Hysterectomy Other: No ascites or  pneumoperitoneum. Musculoskeletal: No acute abnormalities. 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 Degenerative anterolisthesis at L4-5. Foraminal impingement bilaterally at L4-5 and L5-S1. IMPRESSION: No acute finding. Negative for bowel obstruction or visible inflammation. Electronically Signed   By: Monte Fantasia M.D.   On: 05/19/2020 09:11    Procedures Procedures   Medications Ordered in ED Medications  sodium chloride 0.9 % bolus 500 mL (0 mLs Intravenous Stopped 05/27/2020 0956)  fentaNYL (SUBLIMAZE) injection 50 mcg (50 mcg Intravenous Given 05/26/2020 0739)  ondansetron (ZOFRAN) injection 4 mg (4 mg Intravenous Given 05/07/2020 0739)  HYDROmorphone (DILAUDID) injection 0.5 mg (0.5 mg Intravenous Given 05/11/2020 2787)    ED Course  I have reviewed the triage vital signs and the nursing notes.  Pertinent labs & imaging results that were available during my care of the patient were reviewed by me and considered in my medical decision making (see chart for details).    MDM Rules/Calculators/A&P                          Patient presents abdominal pain nausea vomiting some diarrhea.  Began last night.  Creatinine has increased up to 4 now.  Baseline appears closer to 2.  Reviewing notes does not appear that she would want dialysis.  Also continued hypertension.  CT scan done and was reassuring.  No obstruction.  With continued pain and increasing kidney function will require admission to the hospital.  Lipase also mildly elevated.  Will discuss with hospitalist Final Clinical Impression(s) / ED Diagnoses Final diagnoses:  Generalized abdominal pain  AKI (acute kidney injury) [facility])    Rx / DC Orders ED Discharge Orders    None       Davonna Belling, MD 05/25/2020 1042

## 2020-05-15 NOTE — ED Notes (Signed)
Patient transported to CT 

## 2020-05-15 NOTE — Progress Notes (Signed)
Patient was admitted to [phone]. Patient is complaining of 8/10 pain in abdomen. No complaints of nausea at this time. Patient is NPO except for sips w/ meds and ice chips. Patient's BP is elevated (208/106) and HR 118. New order for labetalol PRN obtained from Erin. Repeat 1h later 181/89 and HR 108.  Patient's IV was removed during transfer process and unable to obtain access due to limited vascularity. IV team consult placed.   Son is at bedside. Belongings include clothing, shoes, upper & lower dentures, earrings. Safety precautions have been reviewed with patient and her son. Call bell and phone are within reach of patient.

## 2020-05-15 NOTE — ED Triage Notes (Signed)
Pt reports that she started having abdominal pain. Reports vomiting and diarrhea.

## 2020-05-15 NOTE — H&P (Addendum)
History and Physical    Kimberly Castillo XBM:[id number] DOB: 02-21-1933 DOA: 05/05/2020  Referring MD/NP/PA: Davonna Belling, MD PCP: Biagio Borg, MD  Consultants: Harrie Jeans, MD nephrology Patient coming from: Home  Chief Complaint: Nausea, vomiting, diarrhea, and abdominal pain  I have personally briefly reviewed patient's old medical records in [place]   HPI: Kimberly Castillo is a 87 y.o. female with medical history significant of atrial fibrillation, hypertension, hyperlipidemia, diabetes mellitus type 2, and CKD stage with complaints of nausea, vomiting, diarrhea, and abdominal pain which appear to start around 4 AM this morning.  She had been in her normal state of health prior.  Most of history is obtained from the patient's son present at bedside as the patient is currently moaning in pain.  Apparently, the last thing that she was able to eat yesterday was fish and shrimp.  Patient reports sharp abdominal pain that is all over.  She had multiple episodes of emesis as well as diarrhea without reports of blood present.  No reports of fever, shortness of breath, cough, recent antibiotics use, chest pain, loss of consciousness, falls, or sick contacts.  Since being here in the emergency department patient complained of back pain as well.  She normally intermittently has issues with back pain at baseline.  ED Course: Upon admission into the emergency department patient was seen to be afebrile, pulse 56-103, respiration 14-23, blood pressures elevated up to 141/102, and O2 saturation maintained on room air.  Labs significant for WBC 12.1, hemoglobin 11.4, BUN 63, creatinine 4.44, lipase 184, and LFTs within normal limits.  Urinalysis significant for glucose and protein, but no signs of infection.  CT scan of the abdomen pelvis noted foraminal impingement bilaterally at L4-S1 and degenerative anterolisthesis at L4-5, but did not see any acute findings to cause patient's symptoms.  COVID-19  screening was negative.  Patient had been given normal saline 500 mL fluid bolus, IV pain medication, and Zofran.  TRH was called to admit.   Review of Systems  Constitutional: Positive for malaise/fatigue. Negative for fever.  HENT: Negative for congestion and nosebleeds.   Respiratory: Negative for sputum production and shortness of breath.   Cardiovascular: Negative for chest pain and leg swelling.  Gastrointestinal: Positive for abdominal pain, diarrhea, nausea and vomiting.  Genitourinary: Negative for dysuria and hematuria.  Musculoskeletal: Positive for back pain.  Skin: Negative for rash.  Neurological: Negative for focal weakness and loss of consciousness.  Psychiatric/Behavioral: Negative for substance abuse. The patient has insomnia.     Past Medical History:  Diagnosis Date  . A-fib ([place])   . Anxiety state 12/22/2013  . Arthritis    hands, back  . Arthritis, lumbar spine 12/19/2012  . Cataracts, bilateral   . Chronic lower back pain 12/19/2012  . Diabetes mellitus ([place])   . Diverticulitis   . Dysrhythmia   . GERD (gastroesophageal reflux disease)   . Glaucoma   . Headache    hx migraines  . Heart murmur   . History of shingles   . Hyperlipidemia   . Hypertension   . Numbness    rt side of rt leg  . Post herpetic neuralgia 12/19/2012  . Stroke [facility])    may 2020    Past Surgical History:  Procedure Laterality Date  . ABDOMINAL HYSTERECTOMY  1964   partial  . COLON RESECTION N/A 09/02/2013   Procedure: [place] colectomy;  Surgeon: Excell Seltzer, MD;  Location: [facility];  Service: General;  Laterality: N/A;  . COLONOSCOPY    . COLOSTOMY TAKEDOWN N/A 11/09/2014   Procedure: TAKEDOWN [place] COLOSTOMY;  Surgeon: Excell Seltzer, MD;  Location: [facility];  Service: General;  Laterality: N/A;  . [place]   left  . LOOP RECORDER INSERTION N/A 05/19/2018   Procedure: LOOP RECORDER INSERTION;  Surgeon: Evans Lance, MD;   Location: [facility];  Service: Cardiovascular;  Laterality: N/A;  . POLYPECTOMY       reports that she quit smoking about 22 years ago. Her smoking use included cigarettes. She started smoking about 28 years ago. She has never used smokeless tobacco. She reports that she does not drink alcohol and does not use drugs.  No Known Allergies  Family History  Problem Relation Age of Onset  . Hypertension Mother   . Hypertension Maternal Grandmother   . Colon cancer Neg Hx   . Stomach cancer Neg Hx   . Colon polyps Neg Hx   . Esophageal cancer Neg Hx   . Rectal cancer Neg Hx     Prior to Admission medications   Medication Sig Start Date End Date Taking? Authorizing Provider  diltiazem (CARDIZEM CD) 240 MG 24 hr capsule Take 1 capsule (240 mg total) by mouth daily. Take 240 mg by mouth daily. 07/14/19  Yes Biagio Borg, MD  gabapentin (NEURONTIN) 100 MG capsule Take 1 capsule (100 mg total) by mouth 3 (three) times daily. 07/13/19  Yes Biagio Borg, MD  meclizine (ANTIVERT) 12.5 MG tablet TAKE 1 TABLET BY MOUTH THREE TIMES DAILY AS NEEDED FOR DIZZINESS Patient taking differently: Take 12.5 mg by mouth 3 (three) times daily as needed for dizziness. 05/07/20  Yes Biagio Borg, MD  metoprolol succinate (TOPROL-XL) 25 MG 24 hr tablet Take 25 mg by mouth daily.   Yes [provider]  sodium bicarbonate 650 MG tablet Take 650 mg by mouth 2 (two) times daily.   Yes [provider]  spironolactone (ALDACTONE) 25 MG tablet Take 25 mg by mouth daily.   Yes [provider]  atorvastatin (LIPITOR) 80 MG tablet Take 1 tablet (80 mg total) by mouth daily at 6 PM. Patient not taking: Reported on 05/17/2020 06/23/18   Frann Rider, NP  blood glucose meter kit and supplies KIT Use up to four times daily as directed. E11.9 Patient not taking: Reported on 05/12/2020 05/20/18   Biagio Borg, MD  Blood Glucose Monitoring Suppl (ONE TOUCH ULTRA 2) w/Device KIT USE as  directed Patient not taking: Reported on 05/14/2020 05/13/18   Biagio Borg, MD  cephALEXin (KEFLEX) 500 MG capsule Take 1 capsule (500 mg total) by mouth 3 (three) times daily. Patient not taking: Reported on 05/03/2020 01/27/19   Horton, Barbette Hair, MD  glipiZIDE (GLUCOTROL XL) 2.5 MG 24 hr tablet Take 1 tablet by mouth once daily with breakfast Patient not taking: Reported on 05/07/2020 02/12/20   Biagio Borg, MD  HYDROcodone-acetaminophen (NORCO/VICODIN) 5-325 MG tablet Take 1 tablet by mouth every 6 (six) hours as needed. Patient not taking: Reported on 05/08/2020 01/27/19   Merryl Hacker, MD  Lancets MISC Use as directed once daily Patient not taking: Reported on 05/20/2020 05/13/18   Biagio Borg, MD  metFORMIN (GLUCOPHAGE) 500 MG tablet Take 500 mg by mouth 2 (two) times daily with a meal. Patient not taking: Reported on 05/26/2020 12/19/18   [provider]  metoprolol tartrate (LOPRESSOR) 25 MG tablet Take  1 tablet (25 mg total) by mouth 2 (two) times daily. Patient not taking: Reported on 05/20/2020 05/19/18   Caren Griffins, MD  pantoprazole (PROTONIX) 40 MG tablet Take 1 tablet (40 mg total) by mouth daily. For reflux Patient not taking: Reported on 05/24/2020 05/30/18   Biagio Borg, MD  tiZANidine (ZANAFLEX) 4 MG tablet TAKE 1 TABLET BY MOUTH EVERY 6 HOURS AS NEEDED FOR MUSCLE SPASM Patient not taking: Reported on 05/22/2020 12/19/18   Biagio Borg, MD    Physical Exam:  Constitutional: Elderly female who appears to be in distress.  Patient is moaning and not really wanting to follow commands at this time Vitals:   05/08/2020 0915 05/19/2020 0930 05/08/2020 0945 05/11/2020 1000  BP: (!) 223/86 (!) 233/86 (!) 240/84 (!) 223/94  Pulse: 89 82 79 67  Resp: _0 Temp:      TempSrc:      SpO2: 93% 97% 94% 99%  Height:       Eyes: PERRL, lids and conjunctivae normal ENMT: Mucous membranes are dry. Posterior pharynx clear of any exudate or lesions.  Neck: normal,  supple, no masses, no thyromegaly Respiratory: clear to auscultation bilaterally, no wheezing, no crackles. Normal respiratory effort. No accessory muscle use.  Cardiovascular: Regular rate and rhythm, no murmurs / rubs / gallops. No extremity edema. 2+ pedal pulses. No carotid bruits.  Abdomen: no tenderness, no masses palpated. No hepatosplenomegaly. Bowel sounds positive.  Musculoskeletal: no clubbing / cyanosis. No joint deformity upper and lower extremities. Good ROM, no contractures. Normal muscle tone.  Skin: no rashes, lesions, ulcers. No induration Neurologic: CN 2-12 grossly intact.  Appears able to move all extremities Psychiatric:   Lethargic, but  will answer questions with repeated attempts.    Labs on Admission: I have personally reviewed following labs and imaging studies  CBC: Recent Labs  Lab 05/07/2020 0702  WBC 12.1*  HGB 11.4*  HCT 36.1  MCV 101.1*  PLT 308   Basic Metabolic Panel: Recent Labs  Lab 05/04/2020 0702  NA 138  K 4.7  CL 111  CO2 17*  GLUCOSE 154*  BUN 63*  CREATININE 4.44*  CALCIUM 10.0   GFR: CrCl cannot be calculated (Unknown ideal weight.). Liver Function Tests: Recent Labs  Lab 05/19/2020 0702  AST 27  ALT 28  ALKPHOS 73  BILITOT 0.4  PROT 8.1  ALBUMIN 4.0   Recent Labs  Lab 05/23/2020 0702  LIPASE 184*   No results for input(s): AMMONIA in the last 168 hours. Coagulation Profile: No results for input(s): INR, PROTIME in the last 168 hours. Cardiac Enzymes: No results for input(s): CKTOTAL, CKMB, CKMBINDEX, TROPONINI in the last 168 hours. BNP (last 3 results) No results for input(s): PROBNP in the last 8760 hours. HbA1C: No results for input(s): HGBA1C in the last 72 hours. CBG: Recent Labs  Lab 05/02/2020 0655  GLUCAP 131*   Lipid Profile: No results for input(s): CHOL, HDL, LDLCALC, TRIG, CHOLHDL, LDLDIRECT in the last 72 hours. Thyroid Function Tests: No results for input(s): TSH, T4TOTAL, FREET4, T3FREE,  THYROIDAB in the last 72 hours. Anemia Panel: No results for input(s): VITAMINB12, FOLATE, FERRITIN, TIBC, IRON, RETICCTPCT in the last 72 hours. Urine analysis:    Component Value Date/Time   COLORURINE STRAW (A) 05/31/2020 0825   APPEARANCEUR CLEAR 05/29/2020 0825   LABSPEC 1.010 05/24/2020 0825   PHURINE 6.0 05/31/2020 0825   GLUCOSEU 50 (A) 05/27/2020 0825   GLUCOSEU NEGATIVE 01/15/2019 0957  HGBUR SMALL (A) 05/05/2020 0825   BILIRUBINUR NEGATIVE 05/14/2020 0825   KETONESUR NEGATIVE 05/10/2020 0825   PROTEINUR >=300 (A) 05/27/2020 0825   UROBILINOGEN 0.2 01/15/2019 0957   NITRITE NEGATIVE 05/14/2020 0825   LEUKOCYTESUR NEGATIVE 05/17/2020 0825   Sepsis Labs: No results found for this or any previous visit (from the past 240 hour(s)).   Radiological Exams on Admission: CT ABDOMEN PELVIS WO CONTRAST  Result Date: 05/12/2020 CLINICAL DATA:  Abdominal pain, acute nonlocalized EXAM: CT ABDOMEN AND PELVIS WITHOUT CONTRAST TECHNIQUE: Multidetector CT imaging of the abdomen and pelvis was performed following the standard protocol without IV contrast. COMPARISON:  01/09/2018 FINDINGS: Lower chest:  No contributory findings. Hepatobiliary: No focal liver abnormality.No evidence of biliary obstruction or stone. Pancreas: Unremarkable. Spleen: Unremarkable. Adrenals/Urinary Tract: Negative adrenals. No hydronephrosis or stone. Renal hilar calcification on the left is atherosclerotic. Unremarkable bladder. Stomach/Bowel: No obstruction. No visible bowel inflammation. Sigmoidectomy changes. Vascular/Lymphatic: No acute vascular abnormality. Atheromatous calcification of the aorta and branch vessels. No mass or adenopathy. Reproductive:Hysterectomy Other: No ascites or pneumoperitoneum. Musculoskeletal: No acute abnormalities. 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 Degenerative anterolisthesis at L4-5. Foraminal impingement bilaterally at L4-5 and L5-S1.  IMPRESSION: No acute finding. Negative for bowel obstruction or visible inflammation. Electronically Signed   By: Monte Fantasia M.D.   On: 05/12/2020 09:11    EKG: Independently reviewed.  Sinus tachycardia at 170 bpm with QTC 497.  Assessment/Plan  Nausea, vomiting, and diarrhea with abdominal pain elevated lipase: Acute.  Patient presents with complaints of nausea, vomiting, diarrhea, abdominal pain starting around 4 AM this morning.  Last ate fish and chicken the day before.  Work-up revealed elevated lipase of 181, but CT scan of the abdomen and pelvis without contrast was negative for any visible inflammatory changes or signs of obstruction.  COVID-19 screening was negative.  Question possibility of gastroenteritis vs. pancreatitis vs. other. -Admit to a progressive bed -Strict I&O's   -Aspiration precautions with elevation of head of bed -N.p.o. except for meds and advance diet as tolerated -Antiemetics as needed -Dilaudid IV as needed severe pain -Sodium bicarb with 0.45% normal saline IV fluids that 100 mL/h -Consider need to check stool studies if diarrhea persists  Acute kidney injury superimposed on chronic kidney disease stage III: Patient is followed by Dr. Bethann Punches of nephrology in the outpatient setting.  Creatinine acutely elevated up to 4.44 with BUN 63.  Suspect prerenal cause of symptoms given reports of nausea, vomiting, and diarrhea. -Urine sodium and urine creatinine -IV fluids as seen above -Nephrology was notified of the patient being admitted into the hospital, may need to formally consult if kidney function does not appear to be improving for any other recommendations.  Leukocytosis: WBC elevated at 12.1.  Chest x-ray was ordered which did not show any clear signs of infection and urinalysis did not give concern for infection.  Suspect possibly reactive in nature or related to some viral pathology. -Add on ESR and CRP -Check repeat CBC in a.m.  Hypertensive  emergency: Acute.  Patient presents with blood pressures elevated up to 240/84 with worsening kidney function.  Home blood pressure medications include diltiazem 240 mg daily, metoprolol succinate 25 mg daily, and spironolactone 25 mg daily. -Hold spironolactone due to AKI -Continue home metoprolol and diltiazem -Hydralazine and labetalol IV as needed for elevated blood pressure greater than 180  Prolonged QT interval: Present on admission with QTC 497. -Avoid QT prolonging medication  Macrocytic anemia:  Hemoglobin 11.4 g/dL which appears near patient's baseline 10 to 11 g/dL.  Patient with elevated MCV of 101.1. -Check vitamin B12 and folate levels  Diabetes mellitus type 2: On admission glucose just mildly elevated at 154.  Patient appears to be diet controlled and is not on any diabetic medications.  Last hemoglobin A1c was 5.7 on 01/13/2019. -Continue to monitor and start patient on insulin if blood sugars are high as above 180  Back pain secondary to spondylolisthesis of the lumbar spine: Acute on chronic.  Did not report any recent falls.  CT noted chronic degenerative changes of the lumbar spine.  DNR: Present on admission.  DVT prophylaxis: Heparin Code Status: DNR Family Communication: Son updated at bedside Disposition Plan: Likely discharge home once medically stable Consults called: Nephrology was not formally consulted Admission status: Inpatient require more than 2 midnight stay due to AKI superimposed on CKD  Norval Morton MD [facility]   If 7PM-7AM, please contact night-coverage   05/17/2020, 11:03 AM

## 2020-05-16 ENCOUNTER — Telehealth: Payer: Self-pay | Admitting: Internal Medicine

## 2020-05-16 DIAGNOSIS — I498 Other specified cardiac arrhythmias: Secondary | ICD-10-CM | POA: Diagnosis not present

## 2020-05-16 DIAGNOSIS — E875 Hyperkalemia: Secondary | ICD-10-CM | POA: Diagnosis not present

## 2020-05-16 DIAGNOSIS — N179 Acute kidney failure, unspecified: Secondary | ICD-10-CM | POA: Diagnosis not present

## 2020-05-16 DIAGNOSIS — I959 Hypotension, unspecified: Secondary | ICD-10-CM

## 2020-05-16 DIAGNOSIS — R748 Abnormal levels of other serum enzymes: Secondary | ICD-10-CM | POA: Diagnosis not present

## 2020-05-16 LAB — BASIC METABOLIC PANEL
Anion gap: 16 — ABNORMAL HIGH (ref 5–15)
BUN: 66 mg/dL — ABNORMAL HIGH (ref 8–23)
CO2: 9 mmol/L — ABNORMAL LOW (ref 22–32)
Calcium: 8.9 mg/dL (ref 8.9–10.3)
Chloride: 113 mmol/L — ABNORMAL HIGH (ref 98–111)
Creatinine, Ser: 4.89 mg/dL — ABNORMAL HIGH (ref 0.44–1.00)
GFR, Estimated: 8 mL/min — ABNORMAL LOW (ref >60)
Glucose, Bld: 303 mg/dL — ABNORMAL HIGH (ref 70–99)
Potassium: 6.9 mmol/L (ref 3.5–5.1)
Sodium: 138 mmol/L (ref 135–145)

## 2020-05-16 LAB — CBC
HCT: 31.7 % — ABNORMAL LOW (ref 36.0–46.0)
Hemoglobin: 9.8 g/dL — ABNORMAL LOW (ref 12.0–15.0)
MCH: 31.8 pg (ref 26.0–34.0)
MCHC: 30.9 g/dL (ref 30.0–36.0)
MCV: 102.9 fL — ABNORMAL HIGH (ref 80.0–100.0)
Platelets: 181 10*3/uL (ref 150–400)
RBC: 3.08 MIL/uL — ABNORMAL LOW (ref 3.87–5.11)
RDW: 12.8 % (ref 11.5–15.5)
WBC: 14.3 10*3/uL — ABNORMAL HIGH (ref 4.0–10.5)
nRBC: 0.1 % (ref 0.0–0.2)

## 2020-05-16 MED ORDER — ATROPINE SULFATE 1 MG/10ML IJ SOSY
1.0000 mg | PREFILLED_SYRINGE | Freq: Once | INTRAMUSCULAR | Status: AC
  Administered 2020-05-16: 1 mg via INTRAVENOUS

## 2020-05-16 MED ORDER — SODIUM CHLORIDE 0.9 % IV BOLUS
250.0000 mL | Freq: Once | INTRAVENOUS | Status: AC | PRN
  Administered 2020-05-16: 250 mL via INTRAVENOUS

## 2020-05-16 MED ORDER — ATROPINE SULFATE 1 MG/10ML IJ SOSY
PREFILLED_SYRINGE | INTRAMUSCULAR | Status: AC
  Administered 2020-05-16: 1 mg
  Filled 2020-05-16: qty 10

## 2020-05-18 LAB — UREA NITROGEN, URINE: Urea Nitrogen, Ur: 256 mg/dL

## 2020-05-19 DIAGNOSIS — I959 Hypotension, unspecified: Secondary | ICD-10-CM | POA: Diagnosis not present

## 2020-05-19 DIAGNOSIS — I498 Other specified cardiac arrhythmias: Secondary | ICD-10-CM | POA: Diagnosis present

## 2020-05-19 DIAGNOSIS — E875 Hyperkalemia: Secondary | ICD-10-CM | POA: Diagnosis present

## 2020-05-20 ENCOUNTER — Ambulatory Visit: Payer: Medicare HMO

## 2020-06-01 NOTE — Discharge Summary (Signed)
Death Summary  Kimberly Castillo UEA:[id number] DOB: 06-26-1933 DOA: 05-17-20  PCP: Biagio Borg, MD  Admit date: 17-May-2020 Date of Death: 05-18-2020 Time of Death: 1:51  Notification: Biagio Borg, MD notified of death of 05-21-20   History of present illness:  Kimberly Castillo is a 87 y.o. female with a history of atrial fibrillation, hypertension, hyperlipidemia, diabetes mellitus type 2, and CKD stage III/IV. Kimberly Castillo presented with complaint of nausea, vomiting, diarrhea, and abdominal pain which appear to start around 4 AM on 2022/05/18. Upon admission into the emergency department patient was seen to be afebrile, pulse 56-103, respiration 14-23, blood pressures elevated up to 141/102, and O2 saturation maintained on room air.  Labs significant for WBC 12.1, hemoglobin 11.4, BUN 63, creatinine 4.44, lipase 184, and LFTs within normal limits.  Urinalysis significant for glucose and protein, but no signs of infection.  CT scan of the abdomen pelvis noted foraminal impingement bilaterally at L4-S1 and degenerative anterolisthesis at L4-5, but did not see any acute findings to cause patient's symptoms Kimberly Castillo was noted to have significant pain for which she was given IV pain medication.  Blood pressures are treated with patient's home blood pressure medications and with as needed IV medications of hydralazine and labetalol.  Around 10 PM patient was documented on EKG to have accelerated junctional heart rhythm at 67 bpm. On 05-19-22 around 1 AM patient was noted to have systolic blood pressures and heart rate in the 50s.  Patient was responsive initially and given a 250 cc normal saline bolus without improvement and subsequently ordered a second bolus.  Labs revealed WBC 14.3, hemoglobin 9.8, potassium 6.9, CO2 9 BUN 66, creatinine 4.89, glucose 303, and anion gap 16.  Patient's heart rates dropped into the 30s and 1 mg of atropine was ordered.  However, patient succumbed to her illness and passed away at  1:51 AM.  Final Diagnoses:  1.  Hyperkalemia 2.  Hypotension 3.  Abdominal pain secondary to suspected gastroenteritis 4.  Accelerated junctional rhythm 5.  Acute kidney injury superimposed on chronic kidney disease stage III 6.  Nausea, vomiting, and diarrhea  7.  Elevated lipase 8.  Diabetes mellitus type 2  The results of significant diagnostics from this hospitalization (including imaging, microbiology, ancillary and laboratory) are listed below for reference.    Significant Diagnostic Studies: CT ABDOMEN PELVIS WO CONTRAST  Result Date: 05-17-20 CLINICAL DATA:  Abdominal pain, acute nonlocalized EXAM: CT ABDOMEN AND PELVIS WITHOUT CONTRAST TECHNIQUE: Multidetector CT imaging of the abdomen and pelvis was performed following the standard protocol without IV contrast. COMPARISON:  01/09/2018 FINDINGS: Lower chest:  No contributory findings. Hepatobiliary: No focal liver abnormality.No evidence of biliary obstruction or stone. Pancreas: Unremarkable. Spleen: Unremarkable. Adrenals/Urinary Tract: Negative adrenals. No hydronephrosis or stone. Renal hilar calcification on the left is atherosclerotic. Unremarkable bladder. Stomach/Bowel: No obstruction. No visible bowel inflammation. Sigmoidectomy changes. Vascular/Lymphatic: No acute vascular abnormality. Atheromatous calcification of the aorta and branch vessels. No mass or adenopathy. Reproductive:Hysterectomy Other: No ascites or pneumoperitoneum. Musculoskeletal: No acute abnormalities. 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 Degenerative anterolisthesis at L4-5. Foraminal impingement bilaterally at L4-5 and L5-S1. IMPRESSION: No acute finding. Negative for bowel obstruction or visible inflammation. Electronically Signed   By: Monte Fantasia M.D.   On: 05/17/20 09:11   DG CHEST PORT 1 VIEW  Result Date: 05-17-2020 CLINICAL DATA:  Abdominal pain EXAM: PORTABLE CHEST 1 VIEW COMPARISON:  10/20/2018  FINDINGS: Heart  size within normal limits. No pulmonary vascular congestion. Cardiac monitor device seen. Minimal strandy opacities at the lung bases likely atelectasis. IMPRESSION: Mild bibasilar atelectasis. Electronically Signed   By: Miachel Roux M.D.   On: 05/04/2020 12:56    Microbiology: Recent Results (from the past 240 hour(s))  SARS CORONAVIRUS 2 (TAT 6-24 HRS) Nasopharyngeal Nasopharyngeal Swab     Status: None   Collection Time: 05/27/2020  2:05 PM   Specimen: Nasopharyngeal Swab  Result Value Ref Range Status   SARS Coronavirus 2 NEGATIVE NEGATIVE Final    Comment: (NOTE) SARS-CoV-2 target nucleic acids are NOT DETECTED.  The SARS-CoV-2 RNA is generally detectable in upper and lower respiratory specimens during the acute phase of infection. Negative results do not preclude SARS-CoV-2 infection, do not rule out co-infections with other pathogens, and should not be used as the sole basis for treatment or other patient management decisions. Negative results must be combined with clinical observations, patient history, and epidemiological information. The expected result is Negative.  Fact Sheet for Patients: SugarRoll.be  Fact Sheet for Healthcare Providers: https://www.woods-mathews.com/  This test is not yet approved or cleared by the [place] FDA and  has been authorized for detection and/or diagnosis of SARS-CoV-2 by FDA under an Emergency Use Authorization (EUA). This EUA will remain  in effect (meaning this test can be used) for the duration of the COVID-19 declaration under Se ction 564(b)(1) of the Act, 21 U.S.C. section 360bbb-3(b)(1), unless the authorization is terminated or revoked sooner.  Performed at [facility], [place] [address] [place], [place] [address]      Labs: Basic Metabolic Panel: Recent Labs  Lab 05/28/2020 0702 June 06, 2020 0130  NA 138 138  K 4.7 6.9*  CL 111 113*  CO2 17* 9*  GLUCOSE  154* 303*  BUN 63* 66*  CREATININE 4.44* 4.89*  CALCIUM 10.0 8.9   Liver Function Tests: Recent Labs  Lab 05/30/2020 0702  AST 27  ALT 28  ALKPHOS 73  BILITOT 0.4  PROT 8.1  ALBUMIN 4.0   Recent Labs  Lab 05/28/2020 0702  LIPASE 184*   No results for input(s): AMMONIA in the last 168 hours. CBC: Recent Labs  Lab 05/23/2020 0702 06/06/2020 0130  WBC 12.1* 14.3*  HGB 11.4* 9.8*  HCT 36.1 31.7*  MCV 101.1* 102.9*  PLT 229 181   Cardiac Enzymes: No results for input(s): CKTOTAL, CKMB, CKMBINDEX, TROPONINI in the last 168 hours. D-Dimer No results for input(s): DDIMER in the last 72 hours. BNP: Invalid input(s): POCBNP CBG: Recent Labs  Lab 05/14/2020 0655  GLUCAP 131*   Anemia work up No results for input(s): VITAMINB12, FOLATE, FERRITIN, TIBC, IRON, RETICCTPCT in the last 72 hours. Urinalysis    Component Value Date/Time   COLORURINE STRAW (A) 05/01/2020 0825   APPEARANCEUR CLEAR 05/29/2020 0825   LABSPEC 1.010 05/22/2020 0825   PHURINE 6.0 05/13/2020 0825   GLUCOSEU 50 (A) 05/07/2020 0825   GLUCOSEU NEGATIVE 01/15/2019 0957   HGBUR SMALL (A) 05/14/2020 0825   BILIRUBINUR NEGATIVE 05/08/2020 0825   KETONESUR NEGATIVE 05/17/2020 0825   PROTEINUR >=300 (A) 05/25/2020 0825   UROBILINOGEN 0.2 01/15/2019 0957   NITRITE NEGATIVE 05/15/2020 0825   LEUKOCYTESUR NEGATIVE 05/15/2020 0825   Sepsis Labs Invalid input(s): PROCALCITONIN,  WBC,  LACTICIDVEN     SIGNED:  Norval Morton, MD  [facility] 05/19/2020, 6:44 AM Pager   If 7PM-7AM, please contact night-coverage [email or web address] Password [email or web address]

## 2020-06-01 NOTE — Significant Event (Signed)
Rapid Response Event Note   Reason for Call :  Originally called at 0102 d/t hypotension, SBP-50s, HR-50s.  Per bedside RN, pt responsive at that time. Pt is a DNR. 250cc NS bolus ordered and MD being notified. Despite bolus, pt's SBP continued to be in the 50s. Dr. Sidney Ace notified and 2nd bolus NS ordered. Once 2nd bolus started, pt's breathing became agonal and HR dropped to the 30s. Dr. Sidney Ace notified and ordered 1 amp atropine. 1mg  atropine administered just prior to RRT arrival.  Initial Focused Assessment:  Pt lying in bed with eyes closed, unresponsive, apneic, with IV heart rhythm. Pt is a DNR. Support given to pt and, after a few minutes, went asystolic with no pulse/respirations/heartbeat x 1 minute. This was confirmed by bedside RN Ivin Booty. Dr. Sidney Ace to bedside at this time as well. Son was notified when pt started to decline and is on his way to the hospital.   Interventions:  250cc NS bolus x 2 1amp atropine  Plan of Care:  Chaplain if needed to support son on arrival   Event Summary:   MD Notified: Dr. Sidney Ace notified and came to bedside Call [place] with another emergency) End Time:0210  Dillard Essex, RN

## 2020-06-01 NOTE — Progress Notes (Signed)
After (2) 250 mL boluses, patient's BP remained severely hypotensive and patient became increasingly bradycardic. At approximately 0140 patient began to breathe agonally. MD and rapid response notified again; ordered RN to administer atropine. During administration of atropine, patient became asystolic. MD at bedside; 2 RNs confirmed TOD at [place].

## 2020-06-01 NOTE — Progress Notes (Addendum)
The patient was having accelerated junctional rhythm earlier tonight with a rate of 67 on EKG with left axis deviation and Q waves anteroseptal leads.  She was asymptomatic.  She suddenly became hypotensive and was given a bolus of 250 mL normal saline that did not help followed by another similar bolus after which she became bradycardic to the 30s was given 1 mg of IV atropine.  Unfortunately she succumbed to her illness and became pulseless with asystole.  DNR status was honored.  She was pronounced dead at 01:51.  Her body will be released to the funeral home of the family's choice.  I called the patient's son to inform him.

## 2020-06-01 NOTE — Progress Notes (Signed)
RN attempted to call son to update on patient's change of status

## 2020-06-01 NOTE — Telephone Encounter (Signed)
[facility] Report/Call: ---Caller stated his mother is vomiting and having diarrhea. No fever. She is also having constant stomach pain.  Advised go to ED now. Patient went to ED and has passed away.

## 2020-06-01 DEATH — deceased

## 2021-01-06 IMAGING — US US RENAL
1 series · 14 of 25 positions shown · non-contrast
Comparison: None.

CLINICAL DATA: Stage IV chronic renal disease.

EXAM:
RENAL / URINARY TRACT ULTRASOUND COMPLETE

[Series 1: us renal · 0.19mm/px · 14 of 42 slices shown]
[im 1/42]
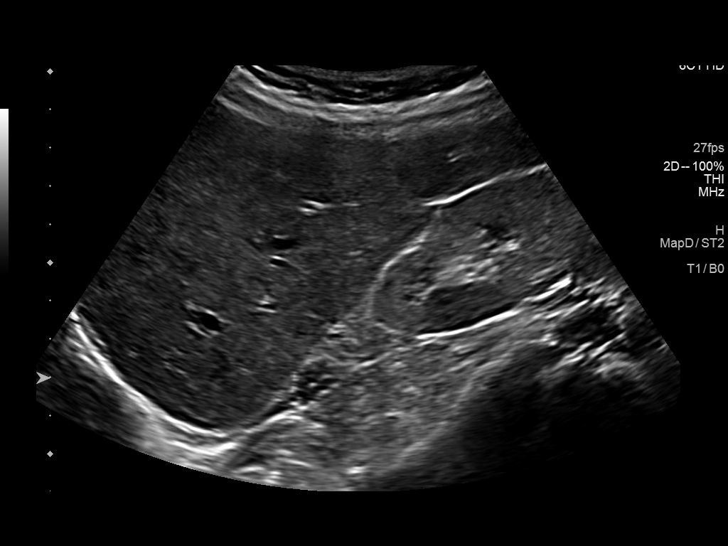
[im 4/42]
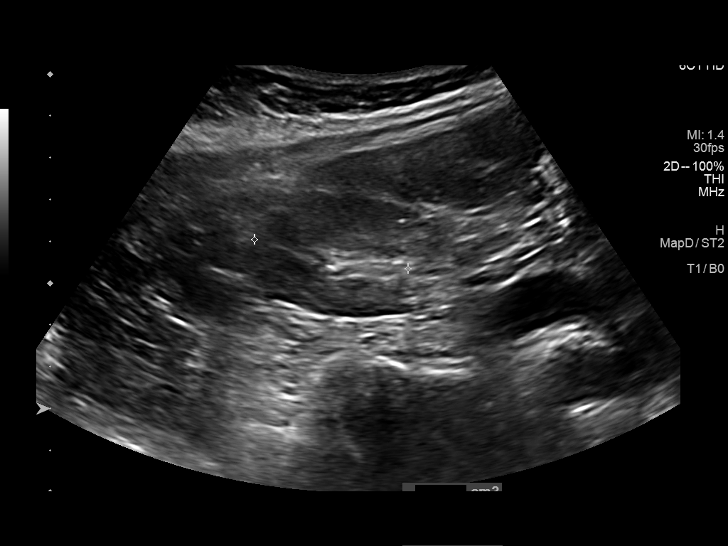
[im 7/42]
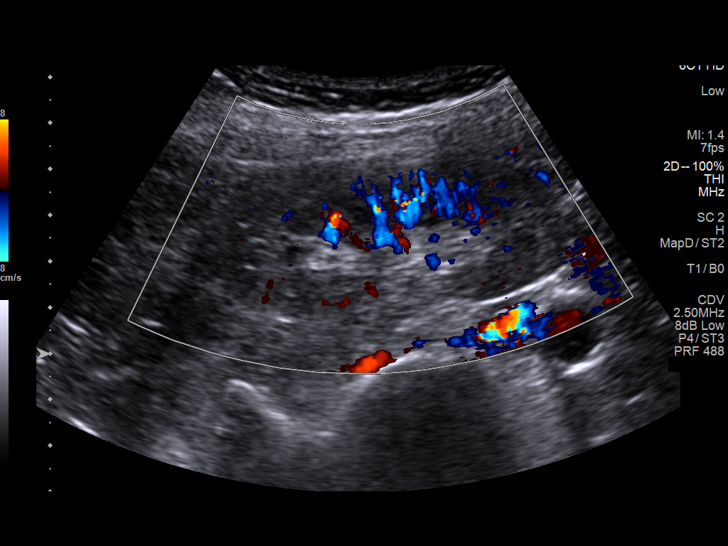
[im 11/42]
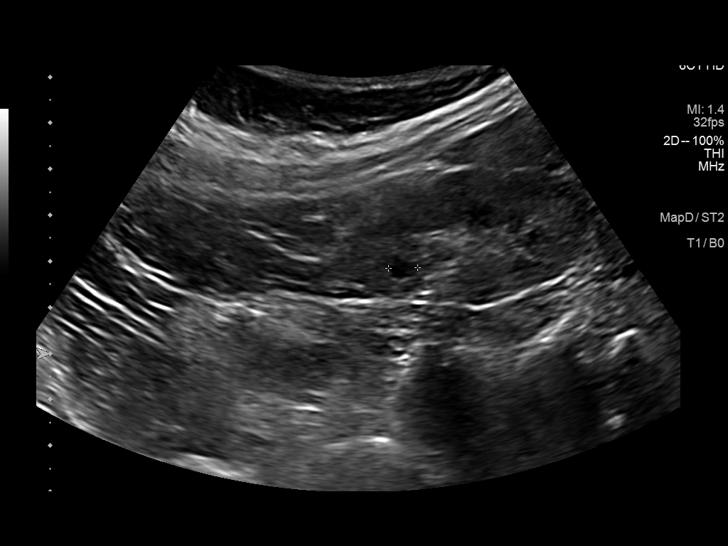
[im 14/42]
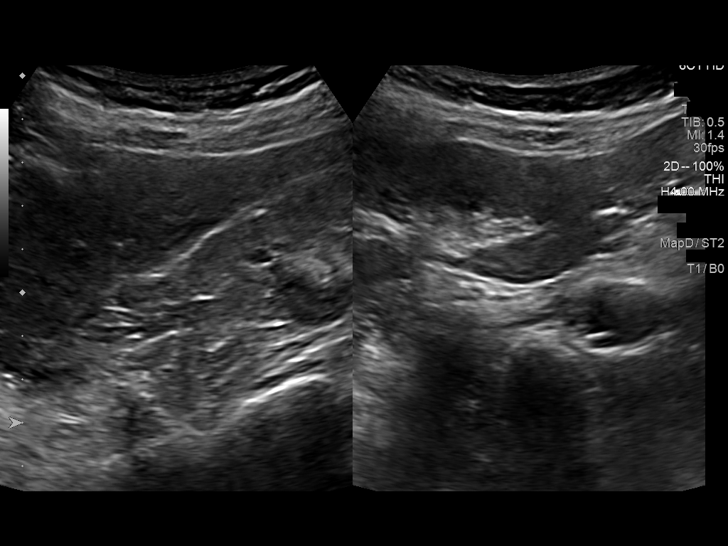
[im 16/42]
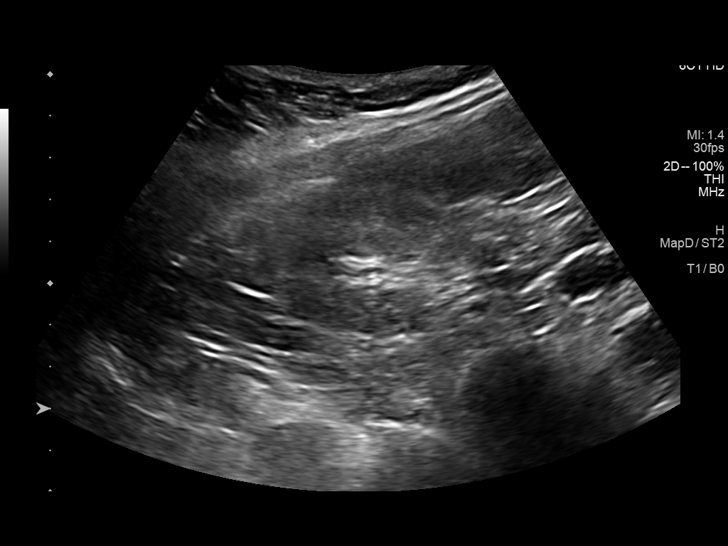
[im 19/42]
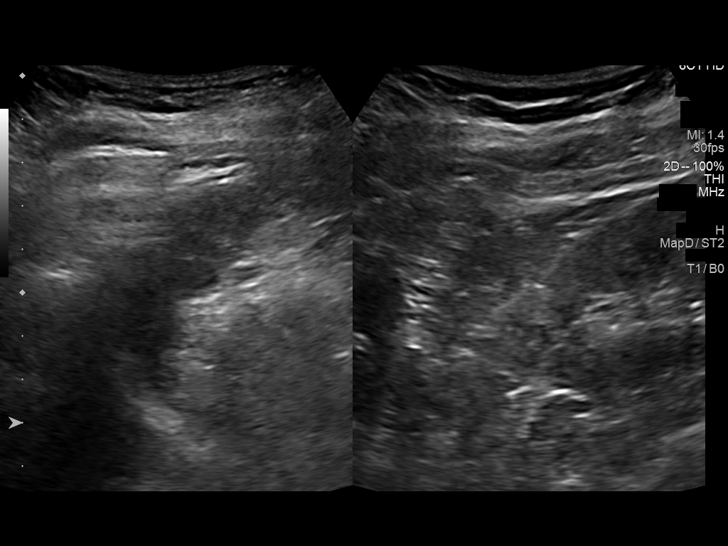
[im 23/42]
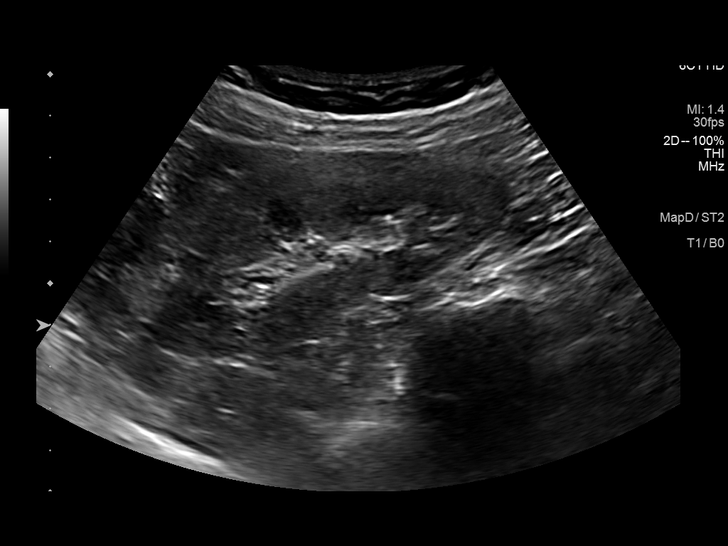
[im 26/42]
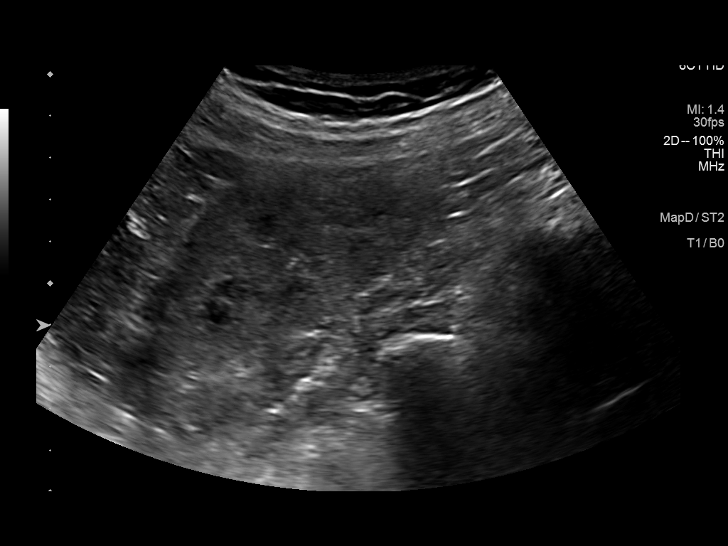
[im 28/42]
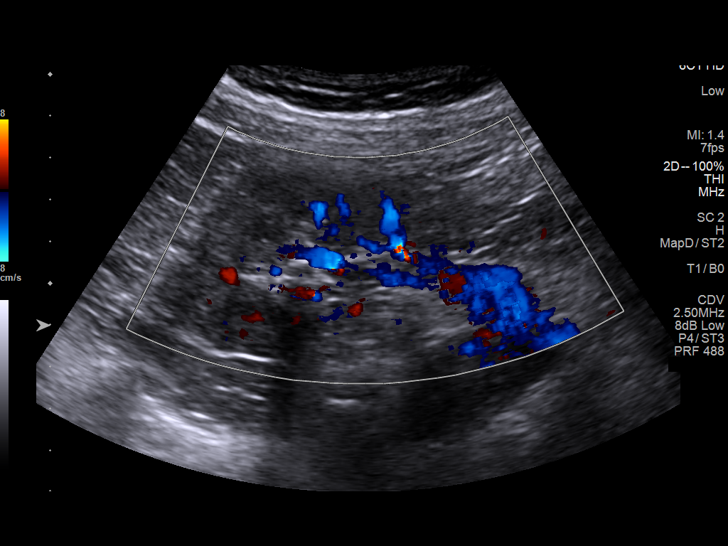
[im 31/42]
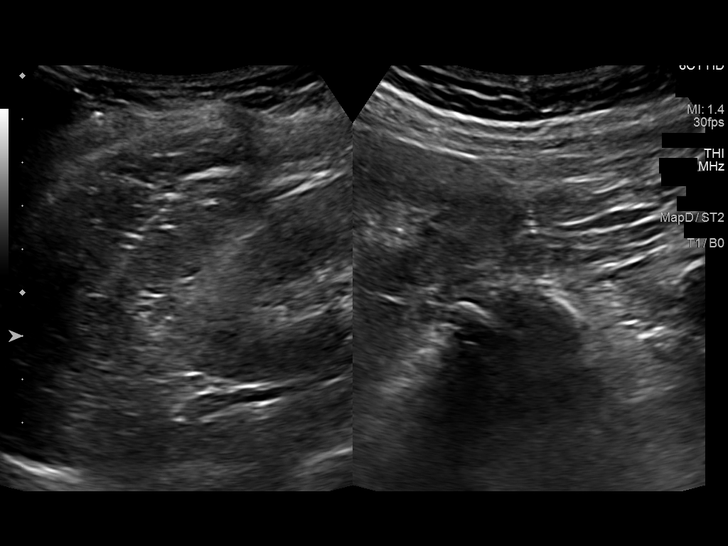
[im 35/42]
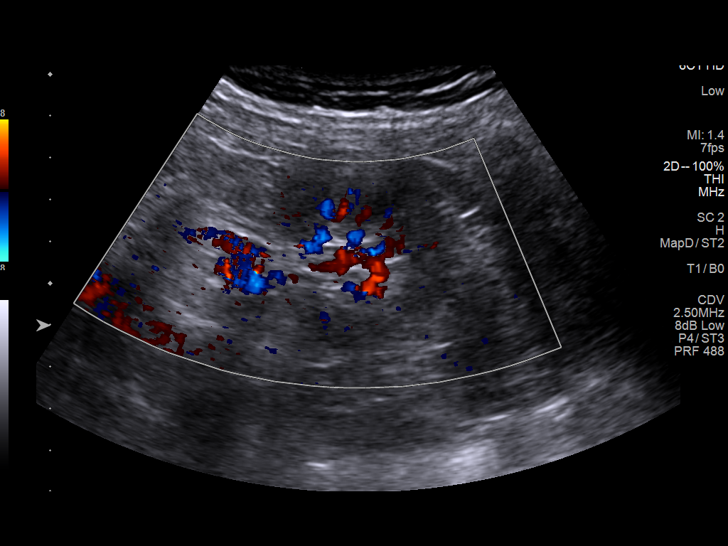
[im 38/42]
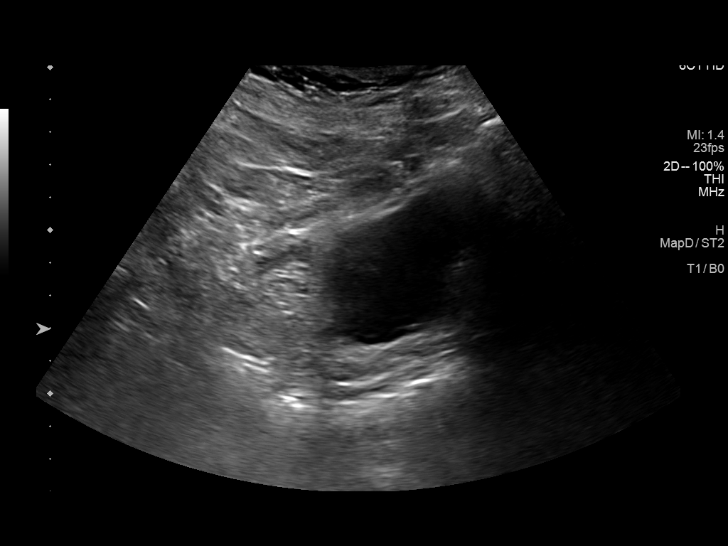
[im 42/42]
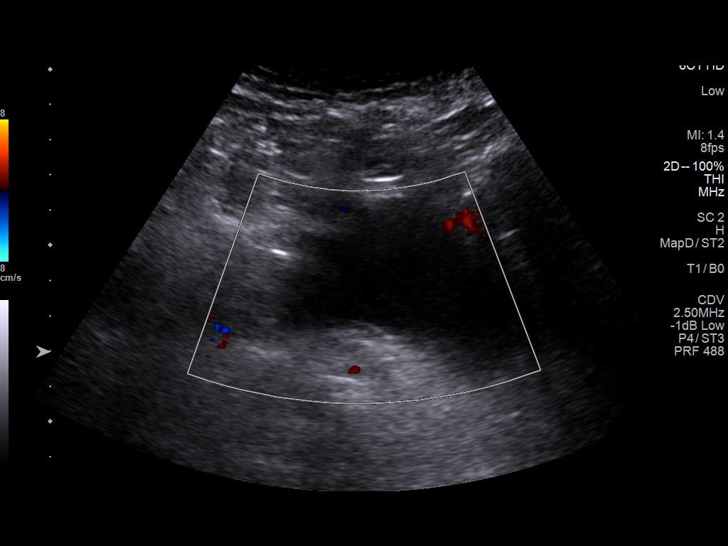

[14 of 25 positions shown; findings below may reference images not displayed]

FINDINGS: Right Kidney:

Renal measurements: 8.1 cm x 3.3 cm x 3.7 cm = volume: 51 mL. There
is diffusely increased echogenicity of the renal parenchyma. A 6 mm
x 4 mm x 6 mm anechoic structure is seen within the posterior aspect
of the right kidney. No hydronephrosis visualized.

Left Kidney:

Renal measurements: 8.7 cm x 3.8 cm x 3.7 cm = volume: 65 mL. There
is diffusely increased echogenicity of the renal parenchyma. No mass
or hydronephrosis visualized.

Bladder:

Appears normal for degree of bladder distention.

Other:

None.
IMPRESSION: 1. Subcentimeter right renal cyst.
2. Bilateral echogenic kidneys which may be secondary to medical
renal disease.

## 2022-01-28 IMAGING — CT CT ABD-PELV W/O CM
2 of 4 series · 17 of 46 positions shown, 19 images · non-contrast
Comparison: 01/09/2018

CLINICAL DATA: Abdominal pain, acute nonlocalized

EXAM:
CT ABDOMEN AND PELVIS WITHOUT CONTRAST
TECHNIQUE: Multidetector CT imaging of the abdomen and pelvis was performed
following the standard protocol without IV contrast.

[Series 3: a/p w/o 5mm · axial · non-contrast · 0.87mm/px · z∈[-520,-155]mm · 14 of 81 slices shown, 16 images]
[im 4/81  soft-tissue]
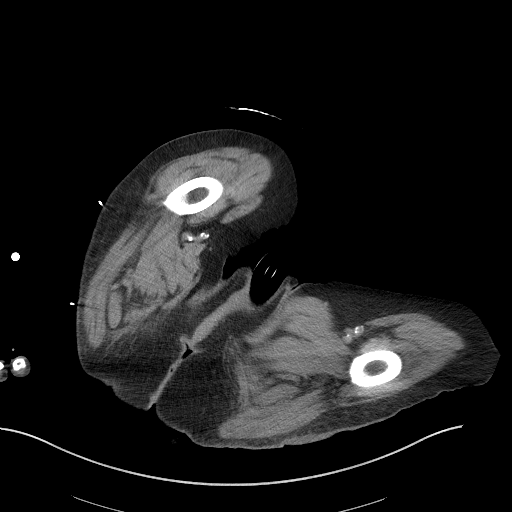
[im 4/81  bone]
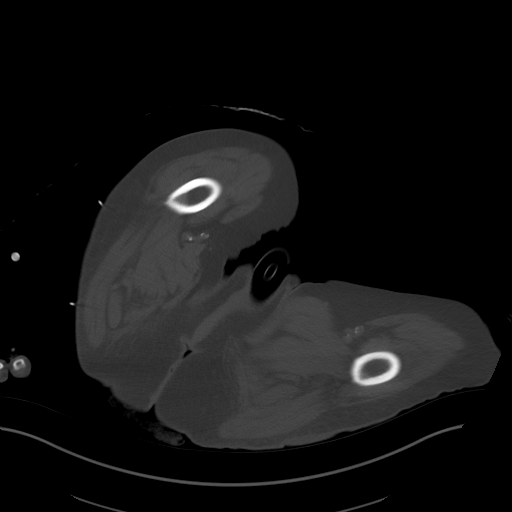
[im 10/81  soft-tissue]
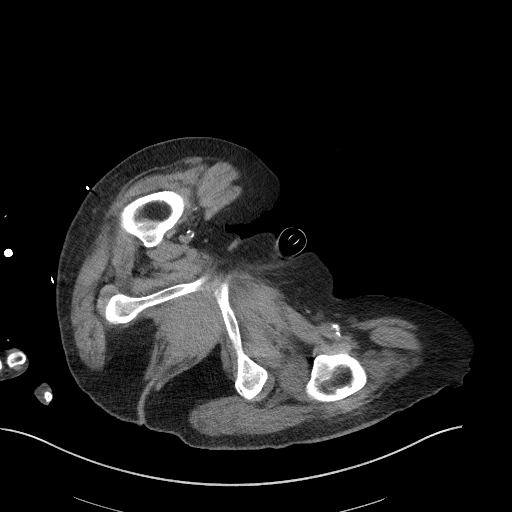
[im 17/81  soft-tissue]
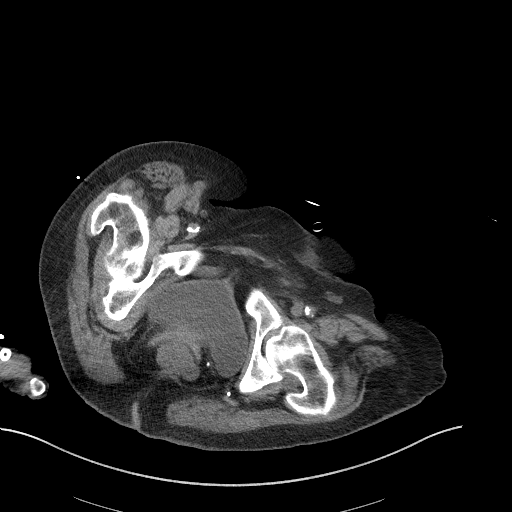
[im 23/81  soft-tissue]
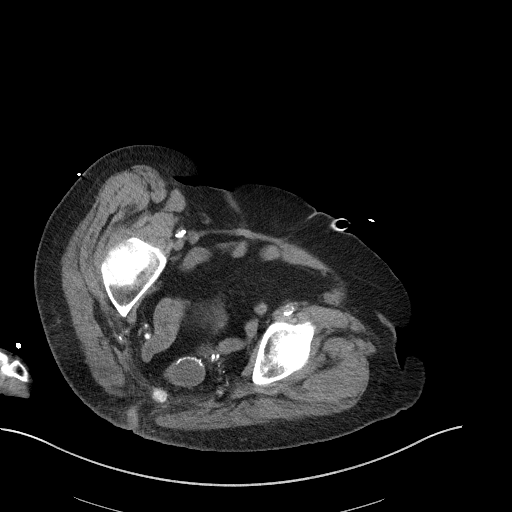
[im 26/81  soft-tissue]
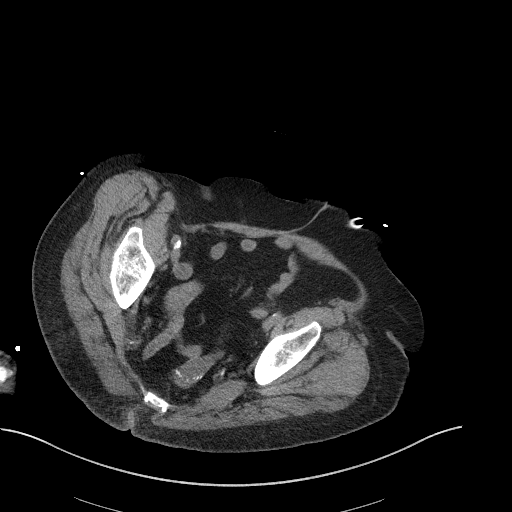
[im 33/81  soft-tissue]
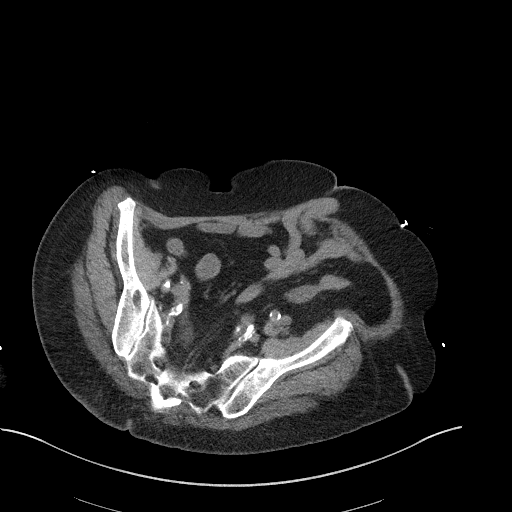
[im 39/81  soft-tissue]
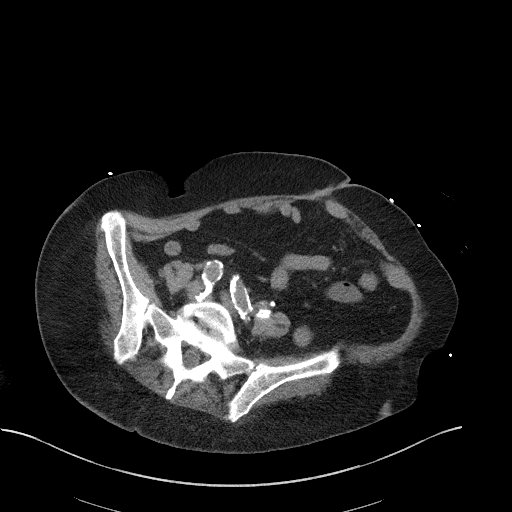
[im 42/81  soft-tissue]
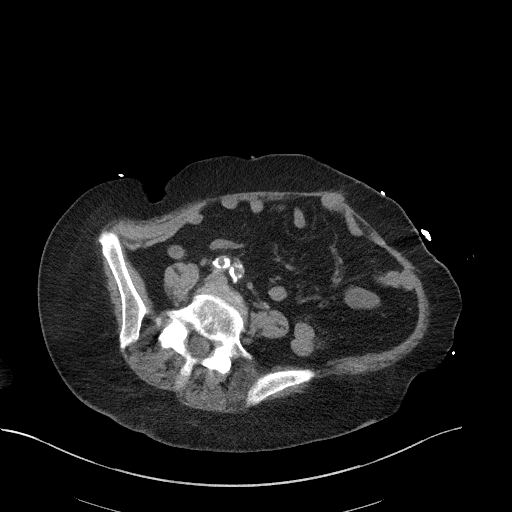
[im 49/81  soft-tissue]
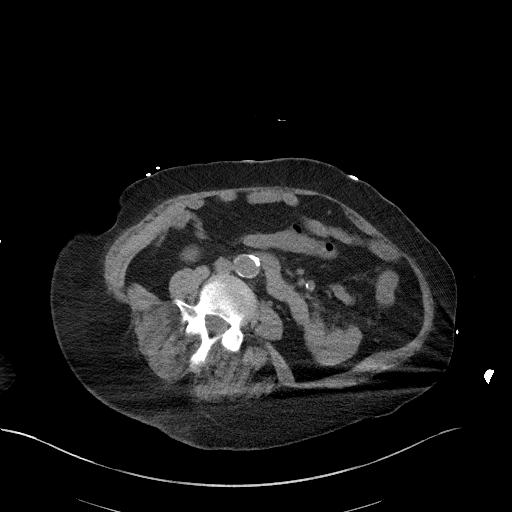
[im 49/81  bone]
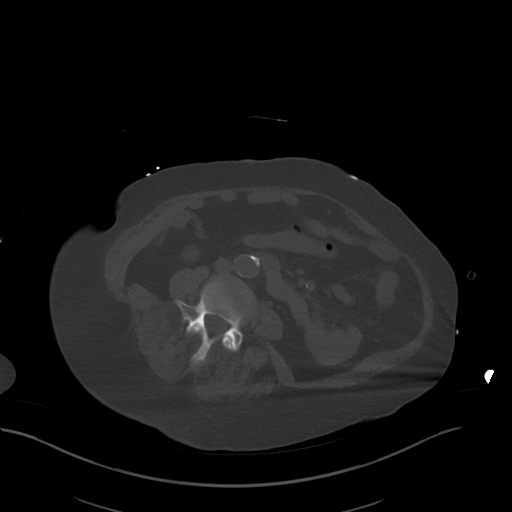
[im 55/81  soft-tissue]
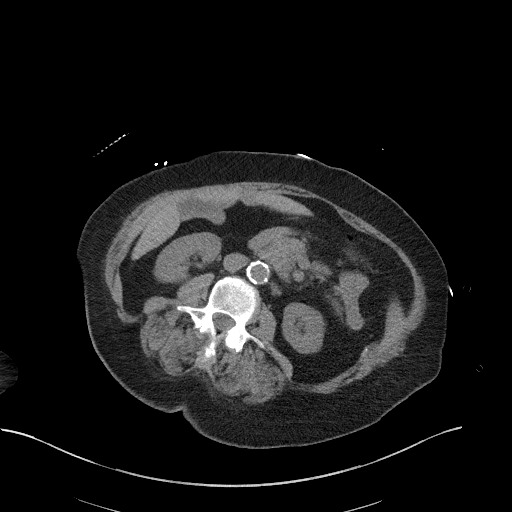
[im 61/81  soft-tissue]
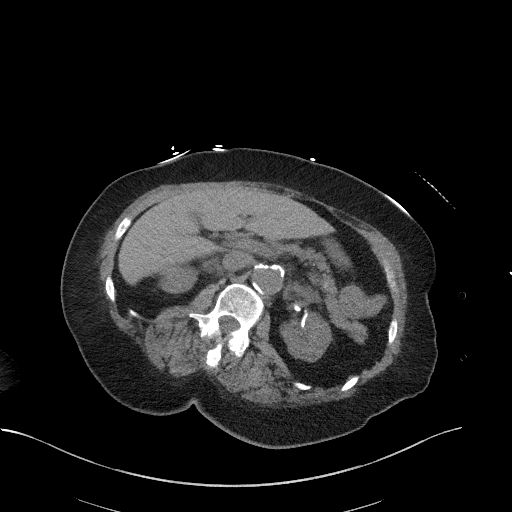
[im 65/81  soft-tissue]
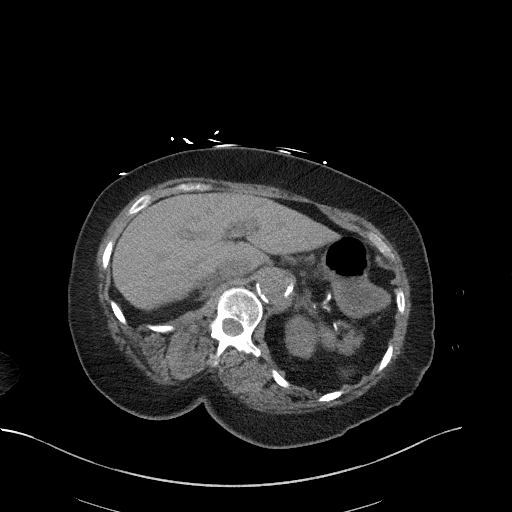
[im 71/81  soft-tissue]
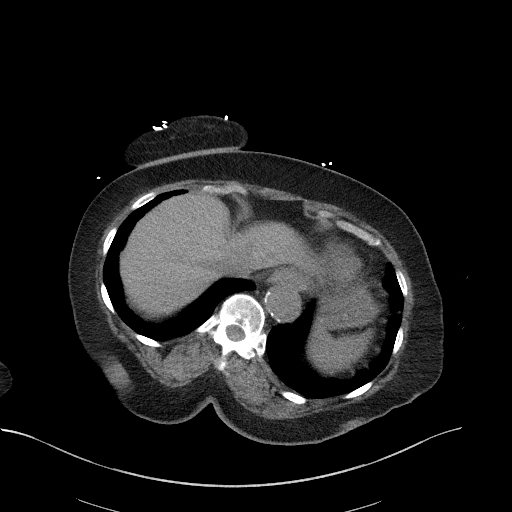
[im 77/81  soft-tissue]
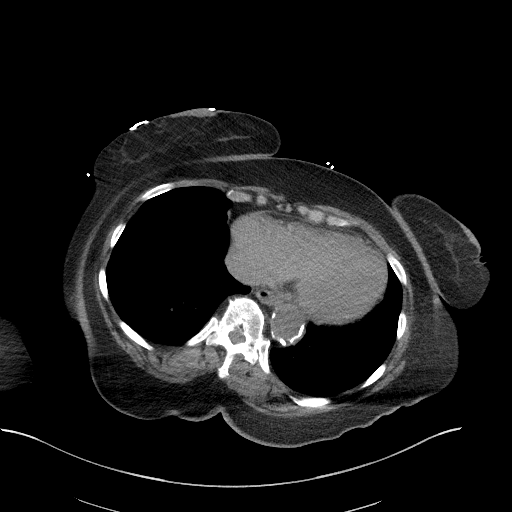

[Series 6: a/p w/o cor · coronal · non-contrast · 0.72mm/px · 3 of 135 slices shown]
[im 45/135  soft-tissue]
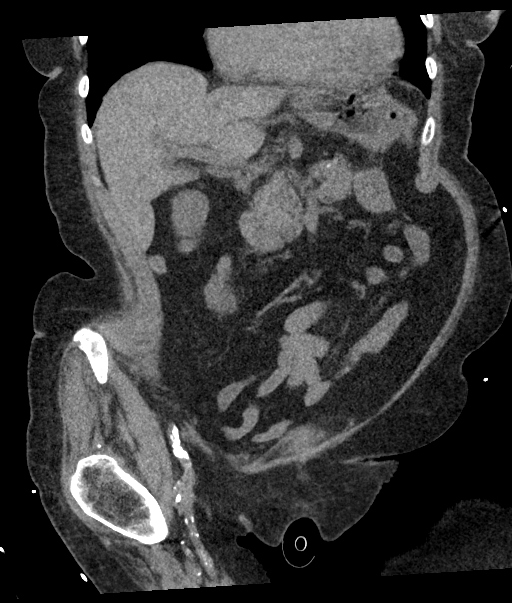
[im 60/135  soft-tissue]
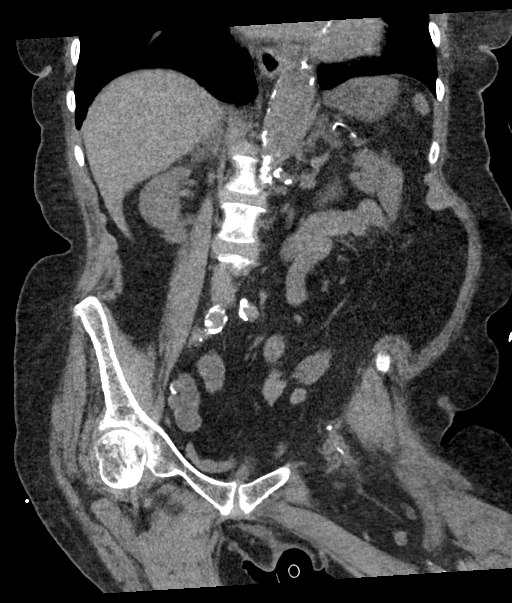
[im 75/135  soft-tissue]
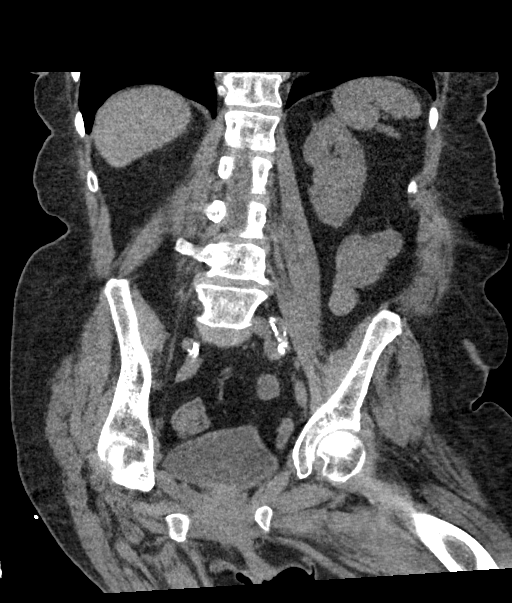

[17 of 46 positions shown; findings below may reference images not displayed]

FINDINGS: Lower chest:  No contributory findings.

Hepatobiliary: No focal liver abnormality.No evidence of biliary
obstruction or stone.

Pancreas: Unremarkable.

Spleen: Unremarkable.

Adrenals/Urinary Tract: Negative adrenals. No hydronephrosis or
stone. Renal hilar calcification on the left is atherosclerotic.
Unremarkable bladder.

Stomach/Bowel: No obstruction. No visible bowel inflammation.
Sigmoidectomy changes.

Vascular/Lymphatic: No acute vascular abnormality. Atheromatous
calcification of the aorta and branch vessels. No mass or
adenopathy.

Reproductive:Hysterectomy

Other: No ascites or pneumoperitoneum.

Musculoskeletal: No acute abnormalities. Lumbar spine degeneration
and chronic avascular necrosis of the femoral heads. Degenerative
anterolisthesis at L4-5. Foraminal impingement bilaterally at L4-5
and L5-S1.
IMPRESSION: No acute finding. Negative for bowel obstruction or visible
inflammation.
